# Patient Record
Sex: Female | Born: 1965
Health system: Southern US, Community
[De-identification: ages and names within clinical notes are randomized; demographics above are authoritative.]

## PROBLEM LIST (undated history)

## (undated) DIAGNOSIS — M179 Osteoarthritis of knee, unspecified: Secondary | ICD-10-CM

## (undated) DIAGNOSIS — D259 Leiomyoma of uterus, unspecified: Secondary | ICD-10-CM

## (undated) DIAGNOSIS — E538 Deficiency of other specified B group vitamins: Secondary | ICD-10-CM

## (undated) DIAGNOSIS — K429 Umbilical hernia without obstruction or gangrene: Secondary | ICD-10-CM

## (undated) DIAGNOSIS — R569 Unspecified convulsions: Secondary | ICD-10-CM

## (undated) DIAGNOSIS — E559 Vitamin D deficiency, unspecified: Secondary | ICD-10-CM

## (undated) DIAGNOSIS — I6529 Occlusion and stenosis of unspecified carotid artery: Secondary | ICD-10-CM

## (undated) DIAGNOSIS — I209 Angina pectoris, unspecified: Secondary | ICD-10-CM

## (undated) DIAGNOSIS — I739 Peripheral vascular disease, unspecified: Secondary | ICD-10-CM

## (undated) DIAGNOSIS — I10 Essential (primary) hypertension: Secondary | ICD-10-CM

## (undated) DIAGNOSIS — R7303 Prediabetes: Secondary | ICD-10-CM

## (undated) DIAGNOSIS — E7800 Pure hypercholesterolemia, unspecified: Secondary | ICD-10-CM

## (undated) DIAGNOSIS — I213 ST elevation (STEMI) myocardial infarction of unspecified site: Secondary | ICD-10-CM

## (undated) DIAGNOSIS — IMO0002 Reserved for concepts with insufficient information to code with codable children: Secondary | ICD-10-CM

## (undated) DIAGNOSIS — Z7902 Long term (current) use of antithrombotics/antiplatelets: Secondary | ICD-10-CM

## (undated) DIAGNOSIS — D649 Anemia, unspecified: Secondary | ICD-10-CM

## (undated) DIAGNOSIS — N390 Urinary tract infection, site not specified: Secondary | ICD-10-CM

## (undated) DIAGNOSIS — Z7982 Long term (current) use of aspirin: Secondary | ICD-10-CM

## (undated) DIAGNOSIS — I259 Chronic ischemic heart disease, unspecified: Secondary | ICD-10-CM

## (undated) DIAGNOSIS — M1711 Unilateral primary osteoarthritis, right knee: Secondary | ICD-10-CM

## (undated) DIAGNOSIS — M1990 Unspecified osteoarthritis, unspecified site: Secondary | ICD-10-CM

## (undated) DIAGNOSIS — R600 Localized edema: Secondary | ICD-10-CM

## (undated) DIAGNOSIS — R7989 Other specified abnormal findings of blood chemistry: Secondary | ICD-10-CM

## (undated) DIAGNOSIS — L03116 Cellulitis of left lower limb: Secondary | ICD-10-CM

## (undated) DIAGNOSIS — I2510 Atherosclerotic heart disease of native coronary artery without angina pectoris: Secondary | ICD-10-CM

## (undated) HISTORY — PX: OTHER SURGICAL HISTORY: SHX169

## (undated) HISTORY — DX: Other specified abnormal findings of blood chemistry: R79.89

## (undated) HISTORY — PX: KNEE SURGERY: SHX244

## (undated) HISTORY — DX: Cellulitis of left lower limb: L03.116

## (undated) HISTORY — PX: CORONARY ARTERY BYPASS GRAFT: SHX141

## (undated) HISTORY — PX: JOINT REPLACEMENT: SHX530

## (undated) HISTORY — DX: Urinary tract infection, site not specified: N39.0

---

## 2004-06-09 ENCOUNTER — Emergency Department: Payer: Self-pay | Admitting: Emergency Medicine

## 2005-03-14 ENCOUNTER — Emergency Department: Payer: Self-pay | Admitting: Internal Medicine

## 2005-08-05 ENCOUNTER — Ambulatory Visit: Payer: Self-pay | Admitting: Family Medicine

## 2008-06-03 ENCOUNTER — Ambulatory Visit: Payer: Self-pay | Admitting: Family Medicine

## 2009-05-21 ENCOUNTER — Ambulatory Visit: Payer: Self-pay | Admitting: Family Medicine

## 2009-06-16 ENCOUNTER — Ambulatory Visit: Payer: Self-pay | Admitting: Family Medicine

## 2009-07-22 ENCOUNTER — Emergency Department: Payer: Self-pay | Admitting: Emergency Medicine

## 2010-07-06 ENCOUNTER — Emergency Department: Payer: Self-pay | Admitting: Emergency Medicine

## 2010-07-12 ENCOUNTER — Ambulatory Visit: Payer: Self-pay | Admitting: Family Medicine

## 2010-07-29 ENCOUNTER — Ambulatory Visit: Payer: Self-pay | Admitting: Family Medicine

## 2011-03-17 ENCOUNTER — Emergency Department: Payer: Self-pay | Admitting: Internal Medicine

## 2011-06-05 ENCOUNTER — Emergency Department: Payer: Self-pay | Admitting: Emergency Medicine

## 2011-09-09 ENCOUNTER — Emergency Department: Payer: Self-pay | Admitting: Emergency Medicine

## 2011-09-09 LAB — URINALYSIS, COMPLETE
Bilirubin,UR: NEGATIVE
Ketone: NEGATIVE
Nitrite: NEGATIVE
Ph: 7 (ref 4.5–8.0)
RBC,UR: 16 /HPF (ref 0–5)
Specific Gravity: 1.005 (ref 1.003–1.030)
Squamous Epithelial: 7

## 2011-09-19 ENCOUNTER — Ambulatory Visit: Payer: Self-pay | Admitting: Family Medicine

## 2011-10-02 ENCOUNTER — Ambulatory Visit: Payer: Self-pay | Admitting: Family Medicine

## 2011-12-01 ENCOUNTER — Ambulatory Visit: Payer: Self-pay | Admitting: Family Medicine

## 2011-12-22 ENCOUNTER — Emergency Department: Payer: Self-pay | Admitting: Emergency Medicine

## 2011-12-22 LAB — URINALYSIS, COMPLETE
Bacteria: NONE SEEN
Bilirubin,UR: NEGATIVE
Glucose,UR: NEGATIVE mg/dL (ref 0–75)
Nitrite: NEGATIVE
Protein: 30
Squamous Epithelial: 14

## 2011-12-22 LAB — COMPREHENSIVE METABOLIC PANEL
Albumin: 2.9 g/dL — ABNORMAL LOW (ref 3.4–5.0)
Anion Gap: 7 (ref 7–16)
BUN: 7 mg/dL (ref 7–18)
Bilirubin,Total: 0.3 mg/dL (ref 0.2–1.0)
Calcium, Total: 8.4 mg/dL — ABNORMAL LOW (ref 8.5–10.1)
Chloride: 103 mmol/L (ref 98–107)
Co2: 27 mmol/L (ref 21–32)
Creatinine: 0.79 mg/dL (ref 0.60–1.30)
EGFR (African American): >60
EGFR (Non-African Amer.): >60
SGOT(AST): 20 U/L (ref 15–37)
SGPT (ALT): 14 U/L
Sodium: 137 mmol/L (ref 136–145)

## 2011-12-25 ENCOUNTER — Emergency Department: Payer: Self-pay | Admitting: Emergency Medicine

## 2011-12-25 LAB — COMPREHENSIVE METABOLIC PANEL
Alkaline Phosphatase: 83 U/L (ref 50–136)
Anion Gap: 7 (ref 7–16)
BUN: 8 mg/dL (ref 7–18)
Bilirubin,Total: 0.3 mg/dL (ref 0.2–1.0)
Co2: 28 mmol/L (ref 21–32)
EGFR (African American): >60
EGFR (Non-African Amer.): >60
Potassium: 3.4 mmol/L — ABNORMAL LOW (ref 3.5–5.1)
Sodium: 141 mmol/L (ref 136–145)
Total Protein: 8 g/dL (ref 6.4–8.2)

## 2011-12-25 LAB — CBC
MCH: 26 pg (ref 26.0–34.0)
Platelet: 374 10*3/uL (ref 150–440)

## 2011-12-25 LAB — LIPASE, BLOOD: Lipase: 144 U/L (ref 73–393)

## 2011-12-25 LAB — URINALYSIS, COMPLETE
Bilirubin,UR: NEGATIVE
Glucose,UR: NEGATIVE mg/dL (ref 0–75)
Nitrite: POSITIVE
Protein: 100
RBC,UR: 158 /HPF (ref 0–5)
Squamous Epithelial: 19
WBC UR: 56 /HPF (ref 0–5)

## 2011-12-27 LAB — URINE CULTURE

## 2012-04-04 ENCOUNTER — Ambulatory Visit: Payer: Self-pay | Admitting: Family Medicine

## 2012-11-15 ENCOUNTER — Emergency Department: Payer: Self-pay | Admitting: Emergency Medicine

## 2012-12-13 ENCOUNTER — Ambulatory Visit: Payer: Self-pay | Admitting: Family Medicine

## 2013-02-23 ENCOUNTER — Emergency Department: Payer: Self-pay | Admitting: Emergency Medicine

## 2013-10-22 ENCOUNTER — Ambulatory Visit: Payer: Self-pay | Admitting: Family Medicine

## 2014-03-30 ENCOUNTER — Ambulatory Visit: Payer: Self-pay | Admitting: Family Medicine

## 2014-09-21 ENCOUNTER — Emergency Department: Payer: Self-pay | Admitting: Emergency Medicine

## 2014-09-26 ENCOUNTER — Emergency Department: Payer: Self-pay | Admitting: Emergency Medicine

## 2014-10-20 ENCOUNTER — Emergency Department
Admit: 2014-10-20 | Disposition: A | Discharge status: Home / Self Care | Payer: Self-pay | Admitting: Emergency Medicine

## 2014-11-24 ENCOUNTER — Ambulatory Visit: Payer: Self-pay

## 2014-11-24 DIAGNOSIS — M1711 Unilateral primary osteoarthritis, right knee: Secondary | ICD-10-CM | POA: Insufficient documentation

## 2014-12-02 ENCOUNTER — Other Ambulatory Visit: Payer: Self-pay

## 2014-12-09 ENCOUNTER — Other Ambulatory Visit: Payer: Self-pay | Admitting: Urology

## 2014-12-09 DIAGNOSIS — R634 Abnormal weight loss: Secondary | ICD-10-CM

## 2014-12-09 DIAGNOSIS — R945 Abnormal results of liver function studies: Principal | ICD-10-CM

## 2014-12-09 DIAGNOSIS — R7989 Other specified abnormal findings of blood chemistry: Secondary | ICD-10-CM

## 2014-12-15 ENCOUNTER — Telehealth: Payer: Self-pay | Admitting: Urology

## 2014-12-15 NOTE — Telephone Encounter (Signed)
-----   Message from Sarah Medina sent at 12/15/2014 11:50 AM EDT ----- Regarding: clarification Please clarify order on weather you wanted a complete abdominal Ultrasound or a abdominal limited ultrasound. Patient is scheduled for first thing tomorrow morning.

## 2014-12-15 NOTE — Telephone Encounter (Signed)
Complete abdominal US.

## 2014-12-16 ENCOUNTER — Ambulatory Visit: Payer: Self-pay

## 2014-12-17 ENCOUNTER — Other Ambulatory Visit: Payer: Self-pay

## 2014-12-23 ENCOUNTER — Ambulatory Visit: Payer: Self-pay | Admitting: Internal Medicine

## 2014-12-23 DIAGNOSIS — R945 Abnormal results of liver function studies: Secondary | ICD-10-CM | POA: Insufficient documentation

## 2014-12-23 DIAGNOSIS — R7989 Other specified abnormal findings of blood chemistry: Secondary | ICD-10-CM | POA: Insufficient documentation

## 2014-12-28 ENCOUNTER — Ambulatory Visit
Admission: RE | Admit: 2014-12-28 | Discharge: 2014-12-28 | Disposition: A | Discharge status: Home / Self Care | Payer: Self-pay | Source: Ambulatory Visit | Attending: Urology | Admitting: Urology

## 2014-12-28 DIAGNOSIS — R7989 Other specified abnormal findings of blood chemistry: Secondary | ICD-10-CM | POA: Insufficient documentation

## 2014-12-28 DIAGNOSIS — R634 Abnormal weight loss: Secondary | ICD-10-CM | POA: Insufficient documentation

## 2014-12-28 DIAGNOSIS — R945 Abnormal results of liver function studies: Secondary | ICD-10-CM

## 2015-01-06 ENCOUNTER — Ambulatory Visit: Payer: Self-pay | Admitting: Ophthalmology

## 2015-01-13 ENCOUNTER — Ambulatory Visit: Payer: Self-pay

## 2015-01-20 ENCOUNTER — Ambulatory Visit: Payer: Self-pay | Admitting: Internal Medicine

## 2015-01-27 ENCOUNTER — Ambulatory Visit: Payer: Self-pay | Admitting: Internal Medicine

## 2015-02-03 ENCOUNTER — Ambulatory Visit: Payer: Self-pay | Admitting: Internal Medicine

## 2015-05-12 ENCOUNTER — Ambulatory Visit: Payer: Self-pay

## 2015-05-19 ENCOUNTER — Ambulatory Visit: Payer: Self-pay | Admitting: Internal Medicine

## 2015-05-24 ENCOUNTER — Other Ambulatory Visit: Payer: Self-pay | Admitting: Internal Medicine

## 2015-05-24 DIAGNOSIS — R945 Abnormal results of liver function studies: Secondary | ICD-10-CM

## 2015-05-31 ENCOUNTER — Ambulatory Visit: Payer: PRIVATE HEALTH INSURANCE

## 2015-06-01 ENCOUNTER — Other Ambulatory Visit: Payer: Self-pay

## 2015-06-24 ENCOUNTER — Emergency Department
Admission: EM | Admit: 2015-06-24 | Discharge: 2015-06-24 | Disposition: A | Discharge status: Home / Self Care | Payer: Self-pay | Attending: Emergency Medicine | Admitting: Emergency Medicine

## 2015-06-24 ENCOUNTER — Encounter: Payer: Self-pay | Admitting: Emergency Medicine

## 2015-06-24 DIAGNOSIS — L509 Urticaria, unspecified: Secondary | ICD-10-CM | POA: Insufficient documentation

## 2015-06-24 HISTORY — DX: Pure hypercholesterolemia, unspecified: E78.00

## 2015-06-24 MED ORDER — CYPROHEPTADINE HCL 4 MG PO TABS: 4.0000 mg | ORAL_TABLET | Freq: Three times a day (TID) | ORAL | Status: DC | PRN

## 2015-06-24 MED ORDER — PREDNISONE 10 MG PO TABS: 10.0000 mg | ORAL_TABLET | Freq: Two times a day (BID) | ORAL | Status: DC

## 2015-06-24 MED ORDER — CYPROHEPTADINE HCL 4 MG PO TABS
4.0000 mg | ORAL_TABLET | Freq: Once | ORAL | Status: AC
  Administered 2015-06-24: 4 mg via ORAL
  Filled 2015-06-24: qty 1

## 2015-06-24 MED ORDER — RANITIDINE HCL 150 MG PO TABS: 150.0000 mg | ORAL_TABLET | Freq: Two times a day (BID) | ORAL | Status: DC

## 2015-06-24 MED ORDER — FAMOTIDINE 20 MG PO TABS
40.0000 mg | ORAL_TABLET | Freq: Once | ORAL | Status: AC
  Administered 2015-06-24: 40 mg via ORAL
  Filled 2015-06-24: qty 2

## 2015-06-24 MED ORDER — DEXAMETHASONE SODIUM PHOSPHATE 10 MG/ML IJ SOLN
10.0000 mg | Freq: Once | INTRAMUSCULAR | Status: AC
  Administered 2015-06-24: 10 mg via INTRAMUSCULAR
  Filled 2015-06-24: qty 1

## 2015-06-24 NOTE — Discharge Instructions (Signed)
Hives Hives are itchy, red, swollen areas of the skin. They can vary in size and location on your body. Hives can come and go for hours or several days (acute hives) or for several weeks (chronic hives). Hives do not spread from person to person (noncontagious). They may get worse with scratching, exercise, and emotional stress. CAUSES   Allergic reaction to food, additives, or drugs.  Infections, including the common cold.  Illness, such as vasculitis, lupus, or thyroid disease.  Exposure to sunlight, heat, or cold.  Exercise.  Stress.  Contact with chemicals. SYMPTOMS   Red or white swollen patches on the skin. The patches may change size, shape, and location quickly and repeatedly.  Itching.  Swelling of the hands, feet, and face. This may occur if hives develop deeper in the skin. DIAGNOSIS  Your caregiver can usually tell what is wrong by performing a physical exam. Skin or blood tests may also be done to determine the cause of your hives. In some cases, the cause cannot be determined. TREATMENT  Mild cases usually get better with medicines such as antihistamines. Severe cases may require an emergency epinephrine injection. If the cause of your hives is known, treatment includes avoiding that trigger.  HOME CARE INSTRUCTIONS   Avoid causes that trigger your hives.  Take antihistamines as directed by your caregiver to reduce the severity of your hives. Non-sedating or low-sedating antihistamines are usually recommended. Do not drive while taking an antihistamine.  Take any other medicines prescribed for itching as directed by your caregiver.  Wear loose-fitting clothing.  Keep all follow-up appointments as directed by your caregiver. SEEK MEDICAL CARE IF:   You have persistent or severe itching that is not relieved with medicine.  You have painful or swollen joints. SEEK IMMEDIATE MEDICAL CARE IF:   You have a fever.  Your tongue or lips are swollen.  You have  trouble breathing or swallowing.  You feel tightness in the throat or chest.  You have abdominal pain. These problems may be the first sign of a life-threatening allergic reaction. Call your local emergency services (911 in [place] MAKE SURE YOU:   Understand these instructions.  Will watch your condition.  Will get help right away if you are not doing well or get worse.   This information is not intended to replace advice given to you by your health care provider. Make sure you discuss any questions you have with your health care provider.   Document Released: 06/19/2005 Document Revised: 06/24/2013 Document Reviewed: 09/12/2011 Elsevier Interactive Patient Education 2016 [place] the prescription meds as directed. Follow-up with your provider as needed. Return to the ED for worsening symptoms like swelling of the mouth, lips, or tongue.

## 2015-06-24 NOTE — ED Notes (Signed)
Reports hives and itching for several days.  No resp distress

## 2015-06-24 NOTE — ED Provider Notes (Signed)
[facility] Emergency Department Provider Note ____________________________________________  Time seen: 1035  I have reviewed the triage vital signs and the nursing notes.  HISTORY  Chief Complaint  Urticaria  HPI Shenelle Hager is a 49 y.o. female reports to the ED for evaluation of hives of an unknown source. She describes itching seemed to worsen over the last few days with onset Sunday morning. She does not have a history of any known allergies or sensitivities. She does report similar episode several months earlier, without a known cause. She denies any difficulty breathing, swallowing, or swelling of her mucous membranes. She is not taking any medication at home for symptom relief.  Past Medical History  Diagnosis Date  . Hypercholesteremia    There are no active problems to display for this patient.  History reviewed. No pertinent past surgical history.  Current Outpatient Rx  Name  Route  Sig  Dispense  Refill  . cyproheptadine (PERIACTIN) 4 MG tablet   Oral   Take 1 tablet (4 mg total) by mouth 3 (three) times daily as needed for allergies.   30 tablet   0   . predniSONE (DELTASONE) 10 MG tablet   Oral   Take 1 tablet (10 mg total) by mouth 2 (two) times daily with a meal.   10 tablet   0   . ranitidine (ZANTAC) 150 MG tablet   Oral   Take 1 tablet (150 mg total) by mouth 2 (two) times daily.   20 tablet   0     Allergies Review of patient's allergies indicates no known allergies.  History reviewed. No pertinent family history.  Social History Social History  Substance Use Topics  . Smoking status: Never Smoker   . Smokeless tobacco: None  . Alcohol Use: None   Review of Systems  Constitutional: Negative for fever. Eyes: Negative for visual changes. ENT: Negative for sore throat. Cardiovascular: Negative for chest pain. Respiratory: Negative for shortness of breath. Gastrointestinal: Negative for abdominal pain,  vomiting and diarrhea. Genitourinary: Negative for dysuria. Musculoskeletal: Negative for back pain. Skin: Positive for rash. Neurological: Negative for headaches, focal weakness or numbness. ____________________________________________  PHYSICAL EXAM:  VITAL SIGNS: ED Triage Vitals  Enc Vitals Group     BP 06/24/15 1018 157/91 mmHg     Pulse Rate 06/24/15 1018 70     Resp 06/24/15 1018 18     Temp 06/24/15 1018 98.2 F (36.8 C)     Temp Source 06/24/15 1018 Oral     SpO2 06/24/15 1018 97 %     Weight 06/24/15 1018 280 lb (127.007 kg)     Height 06/24/15 1018 5\' 6"  (1.676 m)     Head Cir --      Peak Flow --      Pain Score --      Pain Loc --      Pain Edu? --      Excl. in [place]? --    Constitutional: Alert and oriented. Well appearing and in no distress. Head: Normocephalic and atraumatic.      Eyes: Conjunctivae are normal. PERRL. Normal extraocular movements      Ears: Canals clear. TMs intact bilaterally.   Nose: No congestion/rhinorrhea.   Mouth/Throat: Mucous membranes are moist. Uvula is midline and tonsils are flat.   Neck: Supple. No thyromegaly. Hematological/Lymphatic/Immunological: No cervical lymphadenopathy. Cardiovascular: Normal rate, regular rhythm.  Respiratory: Normal respiratory effort. No wheezes/rales/rhonchi. Gastrointestinal: Soft and nontender. No distention. Musculoskeletal: Nontender with  normal range of motion in all extremities.  Neurologic:  Normal gait without ataxia. Normal speech and language. No gross focal neurologic deficits are appreciated. Skin:  Skin is warm, dry and intact. Large erythematous welps noted across the face torso and extremities consistent with urticaria. Psychiatric: Mood and affect are normal. Patient exhibits appropriate insight and judgment. ____________________________________________  PROCEDURES  Decadron 10 mg IM Cyproheptadine 4 mg PO Famotidine 40 mh  PO ____________________________________________  INITIAL IMPRESSION / ASSESSMENT AND PLAN / ED COURSE  Patient with a presentation consistent with idiopathic hives of unknown etiology. Patient is discharged with prescriptions for Periactin and ranitidine to dose in addition to prednisone. She will follow with her primary care provider for ongoing symptoms. She is encouraged to return to the ED for acutely worsening symptoms. ____________________________________________  FINAL CLINICAL IMPRESSION(S) / ED DIAGNOSES  Final diagnoses:  Hives      Melvenia Needles, PA-C 06/24/15 1130  Ahmed Prima, MD 06/24/15 [phone]

## 2015-06-24 NOTE — ED Notes (Signed)
assessement per PA

## 2015-06-30 ENCOUNTER — Emergency Department: Payer: Self-pay

## 2015-06-30 ENCOUNTER — Encounter: Payer: Self-pay | Admitting: *Deleted

## 2015-06-30 ENCOUNTER — Emergency Department
Admission: EM | Admit: 2015-06-30 | Discharge: 2015-06-30 | Disposition: A | Discharge status: Home / Self Care | Payer: Self-pay | Attending: Emergency Medicine | Admitting: Emergency Medicine

## 2015-06-30 DIAGNOSIS — L509 Urticaria, unspecified: Secondary | ICD-10-CM | POA: Insufficient documentation

## 2015-06-30 DIAGNOSIS — Z7952 Long term (current) use of systemic steroids: Secondary | ICD-10-CM | POA: Insufficient documentation

## 2015-06-30 DIAGNOSIS — Z79899 Other long term (current) drug therapy: Secondary | ICD-10-CM | POA: Insufficient documentation

## 2015-06-30 DIAGNOSIS — R2231 Localized swelling, mass and lump, right upper limb: Secondary | ICD-10-CM | POA: Insufficient documentation

## 2015-06-30 MED ORDER — DEXAMETHASONE SODIUM PHOSPHATE 10 MG/ML IJ SOLN
10.0000 mg | Freq: Once | INTRAMUSCULAR | Status: AC
  Administered 2015-06-30: 10 mg via INTRAMUSCULAR
  Filled 2015-06-30: qty 1

## 2015-06-30 MED ORDER — HYDROXYZINE HCL 50 MG PO TABS
50.0000 mg | ORAL_TABLET | Freq: Once | ORAL | Status: AC
Start: 2015-06-30 — End: 2015-06-30
  Administered 2015-06-30: 50 mg via ORAL
  Filled 2015-06-30: qty 1

## 2015-06-30 MED ORDER — HYDROXYZINE PAMOATE 25 MG PO CAPS: 25.0000 mg | ORAL_CAPSULE | Freq: Three times a day (TID) | ORAL | Status: DC | PRN

## 2015-06-30 NOTE — Discharge Instructions (Signed)
Hives Hives are itchy, red, swollen areas of the skin. They can vary in size and location on your body. Hives can come and go for hours or several days (acute hives) or for several weeks (chronic hives). Hives do not spread from person to person (noncontagious). They may get worse with scratching, exercise, and emotional stress. CAUSES   Allergic reaction to food, additives, or drugs.  Infections, including the common cold.  Illness, such as vasculitis, lupus, or thyroid disease.  Exposure to sunlight, heat, or cold.  Exercise.  Stress.  Contact with chemicals. SYMPTOMS   Red or white swollen patches on the skin. The patches may change size, shape, and location quickly and repeatedly.  Itching.  Swelling of the hands, feet, and face. This may occur if hives develop deeper in the skin. DIAGNOSIS  Your caregiver can usually tell what is wrong by performing a physical exam. Skin or blood tests may also be done to determine the cause of your hives. In some cases, the cause cannot be determined. TREATMENT  Mild cases usually get better with medicines such as antihistamines. Severe cases may require an emergency epinephrine injection. If the cause of your hives is known, treatment includes avoiding that trigger.  HOME CARE INSTRUCTIONS   Avoid causes that trigger your hives.  Take antihistamines as directed by your caregiver to reduce the severity of your hives. Non-sedating or low-sedating antihistamines are usually recommended. Do not drive while taking an antihistamine.  Take any other medicines prescribed for itching as directed by your caregiver.  Wear loose-fitting clothing.  Keep all follow-up appointments as directed by your caregiver. SEEK MEDICAL CARE IF:   You have persistent or severe itching that is not relieved with medicine.  You have painful or swollen joints. SEEK IMMEDIATE MEDICAL CARE IF:   You have a fever.  Your tongue or lips are swollen.  You have  trouble breathing or swallowing.  You feel tightness in the throat or chest.  You have abdominal pain. These problems may be the first sign of a life-threatening allergic reaction. Call your local emergency services (911 in [place] MAKE SURE YOU:   Understand these instructions.  Will watch your condition.  Will get help right away if you are not doing well or get worse.   This information is not intended to replace advice given to you by your health care provider. Make sure you discuss any questions you have with your health care provider.   Document Released: 06/19/2005 Document Revised: 06/24/2013 Document Reviewed: 09/12/2011 Elsevier Interactive Patient Education 2016 [employer].   Continue to monitor symptoms. Avoid hot showers. Follow-up with [facility] as needed for ongoing symptoms.

## 2015-06-30 NOTE — ED Provider Notes (Signed)
[facility] Emergency Department Provider Note ____________________________________________  Time seen: 2125  I have reviewed the triage vital signs and the nursing notes.  HISTORY  Chief Complaint  Hand Problem  HPI Sarah Medina is a 49 y.o. female returns to the ED for evaluation of ongoing hives to her trunk and extremities of an unknown cause.Additionally she has some swelling to the right hand without known injury or trauma. She admits that she has been taking the prednisone and the ranitidine as previous prescribed. She has also notes that she had resolution of her symptoms within 24 hours of receiving a steroid shot here in the ED last week. She is unable to pick up the Periactin due to cost.  Past Medical History  Diagnosis Date  . Hypercholesteremia     There are no active problems to display for this patient.   No past surgical history on file.  Current Outpatient Rx  Name  Route  Sig  Dispense  Refill  . hydrOXYzine (VISTARIL) 25 MG capsule   Oral   Take 1 capsule (25 mg total) by mouth 3 (three) times daily as needed for itching. Take 1-2 tabs by mouth 3 (three) times a day as needed for itching   30 capsule   0   . predniSONE (DELTASONE) 10 MG tablet   Oral   Take 1 tablet (10 mg total) by mouth 2 (two) times daily with a meal.   10 tablet   0   . ranitidine (ZANTAC) 150 MG tablet   Oral   Take 1 tablet (150 mg total) by mouth 2 (two) times daily.   20 tablet   0     Allergies Review of patient's allergies indicates no known allergies.  No family history on file.  Social History Social History  Substance Use Topics  . Smoking status: Never Smoker   . Smokeless tobacco: None  . Alcohol Use: No   Review of Systems  Constitutional: Negative for fever. Eyes: Negative for visual changes. ENT: Negative for sore throat. Cardiovascular: Negative for chest pain. Respiratory: Negative for shortness of  breath. Gastrointestinal: Negative for abdominal pain, vomiting and diarrhea. Genitourinary: Negative for dysuria. Musculoskeletal: Negative for back pain. Skin: Positive for rash. Neurological: Negative for headaches, focal weakness or numbness. ____________________________________________  PHYSICAL EXAM:  VITAL SIGNS: ED Triage Vitals  Enc Vitals Group     BP 06/30/15 2029 140/78 mmHg     Pulse Rate 06/30/15 2029 93     Resp --      Temp 06/30/15 2029 97.7 F (36.5 C)     Temp Source 06/30/15 2029 Oral     SpO2 06/30/15 2029 100 %     Weight 06/30/15 2029 291 lb 11.2 oz (132.314 kg)     Height 06/30/15 2029 5\' 6"  (1.676 m)     Head Cir --      Peak Flow --      Pain Score 06/30/15 2038 8     Pain Loc --      Pain Edu? --      Excl. in [place]? --    Constitutional: Alert and oriented. Well appearing and in no distress. Head: Normocephalic and atraumatic.      Eyes: Conjunctivae are normal. PERRL. Normal extraocular movements      Ears: Canals clear. TMs intact bilaterally.   Nose: No congestion/rhinorrhea.   Mouth/Throat: Mucous membranes are moist.   Neck: Supple. No thyromegaly. Hematological/Lymphatic/Immunological: No cervical lymphadenopathy. Cardiovascular: Normal rate,  regular rhythm.  Respiratory: Normal respiratory effort. No wheezes/rales/rhonchi. Gastrointestinal: Soft and nontender. No distention. Musculoskeletal: The right hand shows erythema and swelling over the 3rd-5th MCPs dorsally with edema to the PIPs as well.   Nontender with normal range of motion in all extremities.  Neurologic:  Normal gait without ataxia. Normal speech and language. No gross focal neurologic deficits are appreciated. Skin:  Skin is warm, dry and intact. Patient presents with large hives to the trunk and extremities. Psychiatric: Mood and affect are normal. Patient exhibits appropriate insight and judgment. 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  ____________________________________________  PROCEDURES  Decadron 10 mg IM Vistaril 50 mg PO ____________________________________________  INITIAL IMPRESSION / ASSESSMENT AND PLAN / ED COURSE  Patient with recurrence of hives of unknown source. She also has some mild osteophytes in the right hand is noted on x-ray. She will be discharged with a prescription for Vistaril to dose in addition to the remaining prednisone and ranitidine she received last week. She is encouraged follow with the primary provider for ongoing symptoms. ____________________________________________  FINAL CLINICAL IMPRESSION(S) / ED DIAGNOSES  Final diagnoses:  Hives of unknown origin      Melvenia Needles, PA-C 06/30/15 [place], MD 06/30/15 2308

## 2015-06-30 NOTE — ED Notes (Signed)
Pt has swelling and burning to right hand.  No known injury t o hand.  Pt was seen in er 06/24/15 for hives and continues to have rash on arms and abdomen.  Pt is taking prednisone and zantac.

## 2015-07-20 ENCOUNTER — Encounter
Admission: EM | Disposition: A | Discharge status: Home / Self Care | Payer: Self-pay | Source: Home / Self Care | Attending: Cardiology

## 2015-07-20 ENCOUNTER — Encounter: Payer: Self-pay | Admitting: Certified Registered Nurse Anesthetist

## 2015-07-20 ENCOUNTER — Encounter: Payer: Self-pay | Admitting: Cardiology

## 2015-07-20 ENCOUNTER — Inpatient Hospital Stay
Admission: EM | Admit: 2015-07-20 | Discharge: 2015-07-22 | DRG: 247 | Disposition: A | Discharge status: Home / Self Care | Payer: Medicaid Other | Attending: Cardiology | Admitting: Cardiology

## 2015-07-20 DIAGNOSIS — E785 Hyperlipidemia, unspecified: Secondary | ICD-10-CM | POA: Diagnosis present

## 2015-07-20 DIAGNOSIS — I213 ST elevation (STEMI) myocardial infarction of unspecified site: Secondary | ICD-10-CM | POA: Diagnosis present

## 2015-07-20 DIAGNOSIS — I2119 ST elevation (STEMI) myocardial infarction involving other coronary artery of inferior wall: Secondary | ICD-10-CM

## 2015-07-20 DIAGNOSIS — Z8249 Family history of ischemic heart disease and other diseases of the circulatory system: Secondary | ICD-10-CM

## 2015-07-20 DIAGNOSIS — I10 Essential (primary) hypertension: Secondary | ICD-10-CM | POA: Diagnosis present

## 2015-07-20 DIAGNOSIS — IMO0002 Reserved for concepts with insufficient information to code with codable children: Secondary | ICD-10-CM

## 2015-07-20 DIAGNOSIS — I2510 Atherosclerotic heart disease of native coronary artery without angina pectoris: Secondary | ICD-10-CM

## 2015-07-20 HISTORY — DX: ST elevation (STEMI) myocardial infarction involving other coronary artery of inferior wall: I21.19

## 2015-07-20 HISTORY — DX: Atherosclerotic heart disease of native coronary artery without angina pectoris: I25.10

## 2015-07-20 HISTORY — DX: Reserved for concepts with insufficient information to code with codable children: IMO0002

## 2015-07-20 HISTORY — PX: CARDIAC CATHETERIZATION: SHX172

## 2015-07-20 LAB — CBC
HCT: 41.8 % (ref 35.0–47.0)
Hemoglobin: 13.8 g/dL (ref 12.0–16.0)
MCH: 26.8 pg (ref 26.0–34.0)
MCHC: 32.9 g/dL (ref 32.0–36.0)
MCV: 81.4 fL (ref 80.0–100.0)
PLATELETS: 348 10*3/uL (ref 150–440)
RBC: 5.14 MIL/uL (ref 3.80–5.20)
RDW: 15.1 % — ABNORMAL HIGH (ref 11.5–14.5)
WBC: 10.4 10*3/uL (ref 3.6–11.0)

## 2015-07-20 LAB — PROTIME-INR
INR: 1.01
Prothrombin Time: 13.5 seconds (ref 11.4–15.0)

## 2015-07-20 LAB — CREATININE, SERUM
Creatinine, Ser: 0.83 mg/dL (ref 0.44–1.00)
GFR calc Af Amer: >60 mL/min (ref >60)
GFR calc non Af Amer: >60 mL/min (ref >60)

## 2015-07-20 LAB — APTT: aPTT: 31 seconds (ref 24–36)

## 2015-07-20 LAB — TROPONIN I
TROPONIN I: 12.98 ng/mL — AB (ref <0.031)
Troponin I: 0.94 ng/mL — ABNORMAL HIGH (ref <0.031)

## 2015-07-20 LAB — MRSA PCR SCREENING: MRSA BY PCR: NEGATIVE

## 2015-07-20 SURGERY — LEFT HEART CATH AND CORONARY ANGIOGRAPHY
Anesthesia: Moderate Sedation

## 2015-07-20 MED ORDER — ONDANSETRON HCL 4 MG/2ML IJ SOLN: 4.0000 mg | Freq: Four times a day (QID) | INTRAMUSCULAR | Status: DC | PRN

## 2015-07-20 MED ORDER — METOPROLOL TARTRATE 25 MG PO TABS
25.0000 mg | ORAL_TABLET | Freq: Two times a day (BID) | ORAL | Status: DC
  Administered 2015-07-20 – 2015-07-22 (×5): 25 mg via ORAL
  Filled 2015-07-20 (×5): qty 1

## 2015-07-20 MED ORDER — MIDAZOLAM HCL 2 MG/2ML IJ SOLN
INTRAMUSCULAR | Status: AC
  Filled 2015-07-20: qty 2

## 2015-07-20 MED ORDER — BIVALIRUDIN 250 MG IV SOLR
INTRAVENOUS | Status: AC
  Filled 2015-07-20: qty 500

## 2015-07-20 MED ORDER — NITROGLYCERIN 1 MG/10 ML FOR IR/CATH LAB
INTRA_ARTERIAL | Status: DC | PRN
  Administered 2015-07-20 (×3): 200 ug

## 2015-07-20 MED ORDER — CLOPIDOGREL BISULFATE 75 MG PO TABS
75.0000 mg | ORAL_TABLET | Freq: Every day | ORAL | Status: DC
  Administered 2015-07-21 – 2015-07-22 (×2): 75 mg via ORAL
  Filled 2015-07-20 (×2): qty 1

## 2015-07-20 MED ORDER — ENALAPRIL MALEATE 5 MG PO TABS
5.0000 mg | ORAL_TABLET | Freq: Two times a day (BID) | ORAL | Status: DC
  Administered 2015-07-20 – 2015-07-22 (×5): 5 mg via ORAL
  Filled 2015-07-20 (×8): qty 1

## 2015-07-20 MED ORDER — ASPIRIN EC 325 MG PO TBEC
325.0000 mg | DELAYED_RELEASE_TABLET | Freq: Every day | ORAL | Status: DC
  Administered 2015-07-20 – 2015-07-22 (×3): 325 mg via ORAL
  Filled 2015-07-20 (×3): qty 1

## 2015-07-20 MED ORDER — HEPARIN (PORCINE) IN NACL 100-0.45 UNIT/ML-% IJ SOLN
1150.0000 [IU]/h | INTRAMUSCULAR | Status: DC
  Filled 2015-07-20: qty 250

## 2015-07-20 MED ORDER — CLOPIDOGREL BISULFATE 75 MG PO TABS
ORAL_TABLET | ORAL | Status: DC | PRN
  Administered 2015-07-20: 600 mg via ORAL

## 2015-07-20 MED ORDER — HEPARIN (PORCINE) IN NACL 2-0.9 UNIT/ML-% IJ SOLN
INTRAMUSCULAR | Status: AC
  Filled 2015-07-20: qty 1000

## 2015-07-20 MED ORDER — CLOPIDOGREL BISULFATE 75 MG PO TABS
ORAL_TABLET | ORAL | Status: AC
  Filled 2015-07-20: qty 8

## 2015-07-20 MED ORDER — METOPROLOL TARTRATE 1 MG/ML IV SOLN
INTRAVENOUS | Status: AC
  Filled 2015-07-20: qty 5

## 2015-07-20 MED ORDER — NITROGLYCERIN 5 MG/ML IV SOLN
INTRAVENOUS | Status: AC
  Filled 2015-07-20: qty 10

## 2015-07-20 MED ORDER — MIDAZOLAM HCL 2 MG/2ML IJ SOLN
INTRAMUSCULAR | Status: DC | PRN
  Administered 2015-07-20: 1 mg via INTRAVENOUS

## 2015-07-20 MED ORDER — SODIUM CHLORIDE 0.9 % WEIGHT BASED INFUSION: 3.0000 mL/kg/h | INTRAVENOUS | Status: DC

## 2015-07-20 MED ORDER — SODIUM CHLORIDE 0.9 % IV SOLN
250.0000 mg | INTRAVENOUS | Status: DC | PRN
  Administered 2015-07-20: 1.75 mg/kg/h via INTRAVENOUS

## 2015-07-20 MED ORDER — ACETAMINOPHEN 325 MG PO TABS
650.0000 mg | ORAL_TABLET | ORAL | Status: DC | PRN
  Administered 2015-07-21: 650 mg via ORAL
  Filled 2015-07-20: qty 2

## 2015-07-20 MED ORDER — FENTANYL CITRATE (PF) 100 MCG/2ML IJ SOLN
INTRAMUSCULAR | Status: DC | PRN
  Administered 2015-07-20: 50 ug via INTRAVENOUS

## 2015-07-20 MED ORDER — MORPHINE SULFATE (PF) 4 MG/ML IV SOLN
4.0000 mg | Freq: Once | INTRAVENOUS | Status: AC
  Administered 2015-07-20: 4 mg via INTRAVENOUS
  Filled 2015-07-20: qty 1

## 2015-07-20 MED ORDER — SODIUM CHLORIDE 0.9 % IV SOLN: 250.0000 mL | INTRAVENOUS | Status: DC | PRN

## 2015-07-20 MED ORDER — ASPIRIN 81 MG PO CHEW
324.0000 mg | CHEWABLE_TABLET | Freq: Once | ORAL | Status: AC
  Administered 2015-07-20: 324 mg via ORAL

## 2015-07-20 MED ORDER — METOPROLOL TARTRATE 1 MG/ML IV SOLN
INTRAVENOUS | Status: DC | PRN
  Administered 2015-07-20: 2.5 mg via INTRAVENOUS

## 2015-07-20 MED ORDER — IOHEXOL 300 MG/ML  SOLN
INTRAMUSCULAR | Status: DC | PRN
  Administered 2015-07-20: 250 mL via INTRA_ARTERIAL

## 2015-07-20 MED ORDER — HEPARIN BOLUS VIA INFUSION
4000.0000 [IU] | Freq: Once | INTRAVENOUS | Status: DC
  Filled 2015-07-20: qty 4000

## 2015-07-20 MED ORDER — SODIUM CHLORIDE 0.9 % IJ SOLN
3.0000 mL | Freq: Two times a day (BID) | INTRAMUSCULAR | Status: DC
  Administered 2015-07-20 – 2015-07-21 (×4): 3 mL via INTRAVENOUS

## 2015-07-20 MED ORDER — BIVALIRUDIN BOLUS VIA INFUSION - CUPID
INTRAVENOUS | Status: DC | PRN
  Administered 2015-07-20: 95.25 mg via INTRAVENOUS

## 2015-07-20 MED ORDER — SODIUM CHLORIDE 0.9 % IJ SOLN: 3.0000 mL | INTRAMUSCULAR | Status: DC | PRN

## 2015-07-20 MED ORDER — FENTANYL CITRATE (PF) 100 MCG/2ML IJ SOLN
INTRAMUSCULAR | Status: AC
  Filled 2015-07-20: qty 2

## 2015-07-20 SURGICAL SUPPLY — 14 items
BALLN MINITREK RX 2.0X20 (BALLOONS) ×3
BALLOON MINITREK RX 2.0X20 (BALLOONS) ×1 IMPLANT
CATH INFINITI 5FR ANG PIGTAIL (CATHETERS) ×3 IMPLANT
CATH INFINITI 5FR JL4 (CATHETERS) ×3 IMPLANT
CATH VISTA GUIDE 6FR JR4 SH (CATHETERS) ×3 IMPLANT
DEVICE CLOSURE MYNXGRIP 6/7F (Vascular Products) ×3 IMPLANT
DEVICE INFLAT 30 PLUS (MISCELLANEOUS) ×3 IMPLANT
KIT MANI 3VAL PERCEP (MISCELLANEOUS) ×3 IMPLANT
NEEDLE PERC 18GX7CM (NEEDLE) ×3 IMPLANT
PACK CARDIAC CATH (CUSTOM PROCEDURE TRAY) ×3 IMPLANT
SHEATH PINNACLE 6F 10CM (SHEATH) ×3 IMPLANT
STENT XIENCE ALPINE RX 2.25X23 (Permanent Stent) ×3 IMPLANT
WIRE ASAHI PROWATER 180CM (WIRE) ×6 IMPLANT
WIRE EMERALD 3MM-J .035X150CM (WIRE) ×6 IMPLANT

## 2015-07-20 NOTE — ED Provider Notes (Signed)
[facility] Emergency Department Provider Note  ____________________________________________  Time seen: Seen upon arrival to the treatment area  I have reviewed the triage vital signs and the nursing notes.   HISTORY  Chief Complaint Chest Pain and Weakness    HPI Sarah Medina is a 50 y.o. female with a history of high cholesterol who is presenting today with midsternal pressure-like chest pain which is a 5 out of 10. She says it started yesterday and has been waxing and waning without any radiation. She has some mild associated shortness of breath. No nausea or vomiting. Says that she does not smoke. Has not had any aspirin earlier in the day. Says she is a family history of heart attack.   Past Medical History  Diagnosis Date  . Hypercholesteremia     There are no active problems to display for this patient.   No past surgical history on file.  Current Outpatient Rx  Name  Route  Sig  Dispense  Refill  . hydrOXYzine (VISTARIL) 25 MG capsule   Oral   Take 1 capsule (25 mg total) by mouth 3 (three) times daily as needed for itching. Take 1-2 tabs by mouth 3 (three) times a day as needed for itching   30 capsule   0   . predniSONE (DELTASONE) 10 MG tablet   Oral   Take 1 tablet (10 mg total) by mouth 2 (two) times daily with a meal.   10 tablet   0   . ranitidine (ZANTAC) 150 MG tablet   Oral   Take 1 tablet (150 mg total) by mouth 2 (two) times daily.   20 tablet   0     Allergies Review of patient's allergies indicates no known allergies.  No family history on file.  Social History Social History  Substance Use Topics  . Smoking status: Never Smoker   . Smokeless tobacco: Not on file  . Alcohol Use: No    Review of Systems Constitutional: No fever/chills Eyes: No visual changes. ENT: No sore throat. Cardiovascular: As above Respiratory: Denies shortness of breath. Gastrointestinal: No abdominal pain.  No nausea,  no vomiting.  No diarrhea.  No constipation. Genitourinary: Negative for dysuria. Musculoskeletal: Negative for back pain. Skin: Negative for rash. Neurological: Negative for headaches, focal weakness or numbness.  10-point ROS otherwise negative.  ____________________________________________   PHYSICAL EXAM:  VITAL SIGNS: ED Triage Vitals  Enc Vitals Group     BP --      Pulse --      Resp --      Temp --      Temp src --      SpO2 --      Weight 07/20/15 0747 280 lb (127.007 kg)     Height 07/20/15 0747 5\' 6"  (1.676 m)     Head Cir --      Peak Flow --      Pain Score 07/20/15 0748 10     Pain Loc --      Pain Edu? --      Excl. in [place]? --     Constitutional: Alert and oriented. Well appearing and in no acute distress. Eyes: Conjunctivae are normal. PERRL. EOMI. Head: Atraumatic. Nose: No congestion/rhinnorhea. Mouth/Throat: Mucous membranes are moist.  Oropharynx non-erythematous. Neck: No stridor.   Cardiovascular: Normal rate, regular rhythm. Grossly normal heart sounds.  Good peripheral circulation. Respiratory: Normal respiratory effort.  No retractions. Lungs CTAB. Gastrointestinal: Soft and nontender. No distention.  No abdominal bruits. No CVA tenderness. Musculoskeletal: No lower extremity tenderness nor edema.  No joint effusions. Neurologic:  Normal speech and language. No gross focal neurologic deficits are appreciated. No gait instability. Skin:  Skin is warm, dry and intact. No rash noted. Psychiatric: Mood and affect are normal. Speech and behavior are normal.  ____________________________________________   LABS (all labs ordered are listed, but only abnormal results are displayed)  Labs Reviewed  CBC  APTT  PROTIME-INR  CREATININE, SERUM   ____________________________________________  EKG  ED ECG REPORT I, Augusten Lipkin,  Youlanda Roys, the attending physician, personally viewed and interpreted this ECG.   Date: 07/20/2015  EKG Time: 745  Rate:  84  Rhythm: normal sinus rhythm  Axis: Right axis deviation  Intervals:none  ST&T Change: ST elevations in 1, 2, 3, aVF with cervical depression in aVR and T-wave inversion in aVL. Also depression in V2.  ED ECG REPORT I, Shahzain Kiester,  Youlanda Roys, the attending physician, personally viewed and interpreted this ECG.   Date: 07/20/2015  EKG Time: 755  Rate: 85  Rhythm: normal sinus rhythm  Axis: Normal axis  Intervals:none  ST&T Change: ST elevations are redemonstrated and 2 as well as 3 of 1 mm each also about 0.5 mm ST elevation in aVF.  ____________________________________________  RADIOLOGY   ____________________________________________   PROCEDURES  CRITICAL CARE Performed by: Doran Stabler   Total critical care time: 35 minutes  Critical care time was exclusive of separately billable procedures and treating other patients.  Critical care was necessary to treat or prevent imminent or life-threatening deterioration.  Critical care was time spent personally by me on the following activities: development of treatment plan with patient and/or surrogate as well as nursing, discussions with consultants, evaluation of patient's response to treatment, examination of patient, obtaining history from patient or surrogate, ordering and performing treatments and interventions, ordering and review of laboratory studies, ordering and review of radiographic studies, pulse oximetry and re-evaluation of patient's condition.   ____________________________________________   INITIAL IMPRESSION / ASSESSMENT AND PLAN / ED COURSE  Pertinent labs & imaging results that were available during my care of the patient were reviewed by me and considered in my medical decision making (see chart for details).  ----------------------------------------- 746 AM on 07/20/2015 ----------------------------------------- STEMI alert called and Dr. Saralyn Pilar on his way to the emergency  department.  ----------------------------------------- 8:02 AM on 07/20/2015 -----------------------------------------  Patient still without any acute distress. Given aspirin and nitroglycerin. Heparin is ordered. Patient aware of diagnosis and need for catheterization. Understands plan and is one to comply.  ____________________________________________   FINAL CLINICAL IMPRESSION(S) / ED DIAGNOSES  ST elevation MI.    Orbie Pyo, MD 07/20/15 [phone]

## 2015-07-20 NOTE — Progress Notes (Signed)
Dr. Saralyn Pilar notified of blood pressure 170/114.

## 2015-07-20 NOTE — H&P (Signed)
[facility] Cardiology History and Physical  Patient ID: Sarah Medina MRN: [id number] DOB/AGE: [birth year] 51 y.o. Admit date: 07/20/2015  Primary Care Physician: PROVIDER NOT [place] [facility]ologist Saurav Crumble  HPI: 50 year old female admitted with new onset chest pain. She was in her usual state of health until yesterday when she started to experience sternal chest pain, which waxed and waned throughout the day and night, with radiation to her left arm. She experienced recurrent chest pain this morning, presented to [facility] emergency room where ECG revealed diagnostic ST elevations in the inferior leads consistent with acute inferior ST elevation myocardial infarction.    Past Medical History  Diagnosis Date  . Hypercholesteremia     No past surgical history on file.  Prescriptions prior to admission  Medication Sig Dispense Refill Last Dose  . hydrOXYzine (VISTARIL) 25 MG capsule Take 1 capsule (25 mg total) by mouth 3 (three) times daily as needed for itching. Take 1-2 tabs by mouth 3 (three) times a day as needed for itching 30 capsule 0   . predniSONE (DELTASONE) 10 MG tablet Take 1 tablet (10 mg total) by mouth 2 (two) times daily with a meal. 10 tablet 0   . ranitidine (ZANTAC) 150 MG tablet Take 1 tablet (150 mg total) by mouth 2 (two) times daily. 20 tablet 0    Social History   Social History  . Marital Status: Legally Separated    Spouse Name: N/A  . Number of Children: N/A  . Years of Education: N/A   Occupational History  . Not on file.   Social History Main Topics  . Smoking status: Never Smoker   . Smokeless tobacco: Not on file  . Alcohol Use: No  . Drug Use: Not on file  . Sexual Activity: Not on file   Other Topics Concern  . Not on file   Social History Narrative    No family history on file.    Review of systems complete and found to be negative unless listed above      Physical Exam:  General: Well developed, well nourished, in no  acute distress HEENT:  Normocephalic and atramatic Neck:  No JVD.  Lungs: Clear bilaterally to auscultation and percussion. Heart: HRRR . Normal S1 and S2 without gallops or murmurs.  Abdomen: Bowel sounds are positive, abdomen soft and non-tender  Msk:  Back normal, normal gait. Normal strength and tone for age. Extremities: No clubbing, cyanosis or edema.   Neuro: Alert and oriented X 3. Psych:  Good affect, responds appropriately   Labs:   Lab Results  Component Value Date   WBC 10.4 07/20/2015   HGB 13.8 07/20/2015   HCT 41.8 07/20/2015   MCV 81.4 07/20/2015   PLT 348 07/20/2015    Recent Labs Lab 07/20/15 0759  CREATININE 0.83   Lab Results  Component Value Date   TROPONINI 0.94* 07/20/2015   No results found for: CHOL No results found for: HDL No results found for: LDLCALC No results found for: TRIG No results found for: CHOLHDL No results found for: LDLDIRECT    Radiology: Dg Hand Complete Right  06/30/2015  CLINICAL DATA:  Swelling in the right hand.  No reported injury. EXAM: RIGHT HAND - COMPLETE 3+ VIEW COMPARISON:  None. FINDINGS: No fracture, dislocation or suspicious focal osseous lesion. Mild osteoarthritis at distal interphalangeal joint of the right third finger. Minimal osteoarthritis at the third metacarpophalangeal joint. No joint erosions. IMPRESSION: Mild polyarticular osteoarthritis in the right hand as described.  Electronically Signed   By: Ilona Sorrel M.D.   On: 06/30/2015 21:15    EKG: Acute inferior ST elevation myocardial infarction  ASSESSMENT AND PLAN:   1. Acute inferior ST elevation myocardial infarction  Recommendations  1. Proceed to cardiac catheterization laboratory for immediate coronary angiography and possible percutaneous coronary intervention. The risks, benefits and alternatives were explained to the patient and informed written consent was obtained.  SignedIsaias Cowman MD,PhD, [facility] 07/20/2015, 9:32 AM

## 2015-07-20 NOTE — Progress Notes (Signed)
Dr. Saralyn Pilar paged regarding troponin.

## 2015-07-20 NOTE — ED Notes (Signed)
Pt reports CP that started last pm. Reports radiates into her left arm, right arm and back.

## 2015-07-21 LAB — CBC
HCT: 36.3 % (ref 35.0–47.0)
HEMOGLOBIN: 11.8 g/dL — AB (ref 12.0–16.0)
MCH: 26.6 pg (ref 26.0–34.0)
MCHC: 32.6 g/dL (ref 32.0–36.0)
MCV: 81.8 fL (ref 80.0–100.0)
PLATELETS: 305 10*3/uL (ref 150–440)
RBC: 4.44 MIL/uL (ref 3.80–5.20)
RDW: 15.5 % — ABNORMAL HIGH (ref 11.5–14.5)
WBC: 8.7 10*3/uL (ref 3.6–11.0)

## 2015-07-21 LAB — BASIC METABOLIC PANEL
ANION GAP: 7 (ref 5–15)
BUN: 10 mg/dL (ref 6–20)
CALCIUM: 8.4 mg/dL — AB (ref 8.9–10.3)
CHLORIDE: 104 mmol/L (ref 101–111)
CO2: 27 mmol/L (ref 22–32)
CREATININE: 0.68 mg/dL (ref 0.44–1.00)
GFR calc non Af Amer: >60 mL/min (ref >60)
Glucose, Bld: 104 mg/dL — ABNORMAL HIGH (ref 65–99)
Potassium: 3.7 mmol/L (ref 3.5–5.1)
SODIUM: 138 mmol/L (ref 135–145)

## 2015-07-21 LAB — TROPONIN I
TROPONIN I: 10.39 ng/mL — AB (ref <0.031)
Troponin I: 8.11 ng/mL — ABNORMAL HIGH (ref <0.031)

## 2015-07-21 LAB — GLUCOSE, CAPILLARY: GLUCOSE-CAPILLARY: 92 mg/dL (ref 65–99)

## 2015-07-21 MED ORDER — ATORVASTATIN CALCIUM 20 MG PO TABS
40.0000 mg | ORAL_TABLET | Freq: Every day | ORAL | Status: DC
Start: 2015-07-21 — End: 2015-07-22
  Administered 2015-07-21: 40 mg via ORAL
  Filled 2015-07-21: qty 2

## 2015-07-21 MED ORDER — ALUM & MAG HYDROXIDE-SIMETH 200-200-20 MG/5ML PO SUSP
15.0000 mL | ORAL | Status: DC | PRN
  Administered 2015-07-21: 15 mL via ORAL
  Filled 2015-07-21: qty 30

## 2015-07-21 NOTE — Progress Notes (Signed)
Patient in room from CCU. Vital signs stable, oriented to the room and safety contract signed, tele box verified with candace NT. Groin site WNL no signs of bleeding or hematoma noted. Will continue to monitor skin verified with [place].

## 2015-07-21 NOTE — Progress Notes (Addendum)
Pt was up in the chair at the start of shift. Pt able to use bedside commode, and ambulate back to bed. Tolerated well.

## 2015-07-21 NOTE — Progress Notes (Signed)
[facility] Cardiology  SUBJECTIVE: I don't have chest pain   Filed Vitals:   07/21/15 0300 07/21/15 0400 07/21/15 0500 07/21/15 0600  BP: 119/73 122/74 115/70 129/64  Pulse: 66 65 62 67  Temp:    98.5 F (36.9 C)  TempSrc:    Oral  Resp: 14 16 14 19   Height:      Weight:      SpO2: 96% 99% 98% 98%     Intake/Output Summary (Last 24 hours) at 07/21/15 [id number] Last data filed at 07/20/15 2200  Gross per 24 hour  Intake    843 ml  Output    350 ml  Net    493 ml      PHYSICAL EXAM  General: Well developed, well nourished, in no acute distress HEENT:  Normocephalic and atramatic Neck:  No JVD.  Lungs: Clear bilaterally to auscultation and percussion. Heart: HRRR . Normal S1 and S2 without gallops or murmurs.  Abdomen: Bowel sounds are positive, abdomen soft and non-tender  Msk:  Back normal, normal gait. Normal strength and tone for age. Extremities: No clubbing, cyanosis or edema.   Neuro: Alert and oriented X 3. Psych:  Good affect, responds appropriately   LABS: Basic Metabolic Panel:  Recent Labs  07/20/15 0759 07/21/15 0514  NA  --  138  K  --  3.7  CL  --  104  CO2  --  27  GLUCOSE  --  104*  BUN  --  10  CREATININE 0.83 0.68  CALCIUM  --  8.4*   Liver Function Tests: No results for input(s): AST, ALT, ALKPHOS, BILITOT, PROT, ALBUMIN in the last 72 hours. No results for input(s): LIPASE, AMYLASE in the last 72 hours. CBC:  Recent Labs  07/20/15 0759 07/21/15 0514  WBC 10.4 8.7  HGB 13.8 11.8*  HCT 41.8 36.3  MCV 81.4 81.8  PLT 348 305   Cardiac Enzymes:  Recent Labs  07/20/15 1726 07/20/15 2320 07/21/15 0514  TROPONINI 12.98* 10.39* 8.11*   BNP: Invalid input(s): POCBNP D-Dimer: No results for input(s): DDIMER in the last 72 hours. Hemoglobin A1C: No results for input(s): HGBA1C in the last 72 hours. Fasting Lipid Panel: No results for input(s): CHOL, HDL, LDLCALC, TRIG, CHOLHDL, LDLDIRECT in the last 72 hours. Thyroid Function  Tests: No results for input(s): TSH, T4TOTAL, T3FREE, THYROIDAB in the last 72 hours.  Invalid input(s): FREET3 Anemia Panel: No results for input(s): VITAMINB12, FOLATE, FERRITIN, TIBC, IRON, RETICCTPCT in the last 72 hours.  No results found.   Echo   TELEMETRY: Normal sinus rhythm  ASSESSMENT AND PLAN:  Active Problems:   ST elevation myocardial infarction (STEMI) of inferior wall (HCC)   ST elevation myocardial infarction (STEMI) of inferior wall, initial episode of care ([place])    1. Acute inferior STEMI, status post successful PCI and stent of proximal RCA, no recurrent chest pain 2. Essential hypertension  Recommendations  1. Continue current medications 2. Add atorvastatin 40 mg daily 3. Transfer to telemetry   [place], MD, PhD, [facility] 07/21/2015 7:21 AM

## 2015-07-21 NOTE — Progress Notes (Signed)
4AM assessment delayed until  0600 per patient preference.

## 2015-07-22 LAB — BASIC METABOLIC PANEL
Anion gap: 4 — ABNORMAL LOW (ref 5–15)
BUN: 12 mg/dL (ref 6–20)
CHLORIDE: 103 mmol/L (ref 101–111)
CO2: 29 mmol/L (ref 22–32)
CREATININE: 0.86 mg/dL (ref 0.44–1.00)
Calcium: 8.4 mg/dL — ABNORMAL LOW (ref 8.9–10.3)
GFR calc Af Amer: >60 mL/min (ref >60)
GFR calc non Af Amer: >60 mL/min (ref >60)
Glucose, Bld: 101 mg/dL — ABNORMAL HIGH (ref 65–99)
Potassium: 3.6 mmol/L (ref 3.5–5.1)
SODIUM: 136 mmol/L (ref 135–145)

## 2015-07-22 LAB — CBC
HCT: 36.3 % (ref 35.0–47.0)
Hemoglobin: 11.9 g/dL — ABNORMAL LOW (ref 12.0–16.0)
MCH: 26.8 pg (ref 26.0–34.0)
MCHC: 32.8 g/dL (ref 32.0–36.0)
MCV: 82 fL (ref 80.0–100.0)
PLATELETS: 311 10*3/uL (ref 150–440)
RBC: 4.43 MIL/uL (ref 3.80–5.20)
RDW: 15.8 % — AB (ref 11.5–14.5)
WBC: 9.5 10*3/uL (ref 3.6–11.0)

## 2015-07-22 MED ORDER — ENALAPRIL MALEATE 5 MG PO TABS: 5.0000 mg | ORAL_TABLET | Freq: Two times a day (BID) | ORAL | Status: DC

## 2015-07-22 MED ORDER — ATORVASTATIN CALCIUM 40 MG PO TABS: 40.0000 mg | ORAL_TABLET | Freq: Every day | ORAL | Status: DC

## 2015-07-22 MED ORDER — PREDNISONE 10 MG PO TABS: ORAL_TABLET | ORAL | Status: DC

## 2015-07-22 MED ORDER — ENALAPRIL MALEATE 5 MG PO TABS
5.0000 mg | ORAL_TABLET | Freq: Two times a day (BID) | ORAL | Status: DC
Start: 2015-07-22 — End: 2015-07-27

## 2015-07-22 MED ORDER — DIPHENHYDRAMINE HCL 25 MG PO CAPS: 25.0000 mg | ORAL_CAPSULE | Freq: Four times a day (QID) | ORAL | Status: DC | PRN

## 2015-07-22 MED ORDER — ASPIRIN 325 MG PO TBEC: 325.0000 mg | DELAYED_RELEASE_TABLET | Freq: Every day | ORAL | Status: DC

## 2015-07-22 MED ORDER — METOPROLOL TARTRATE 25 MG PO TABS: 25.0000 mg | ORAL_TABLET | Freq: Two times a day (BID) | ORAL | Status: DC

## 2015-07-22 MED ORDER — CLOPIDOGREL BISULFATE 75 MG PO TABS: 75.0000 mg | ORAL_TABLET | Freq: Every day | ORAL | Status: DC

## 2015-07-22 NOTE — Progress Notes (Signed)
Pt to be discharged today. Iv and tele removed. disch instructions and prescrips given to pt and her spouse. Work excuse provided. disch via w.c. Accompanied by husband.

## 2015-07-22 NOTE — Consult Note (Signed)
[facility] NAME: Sarah Medina    MR#:  [id number]  DATE OF BIRTH:  17-Sep-1965  DATE OF CONSULT:  07/22/2015  PRIMARY CARE PHYSICIAN: PROVIDER NOT IN SYSTEM   REQUESTING/REFERRING PHYSICIAN: Dr. Saralyn Pilar  CHIEF COMPLAINT:   Left eye swelling  HISTORY OF PRESENT ILLNESS:  Sarah Medina  is a 50 y.o. female with a known history of hyperlipidemia who presented to the hospital due to chest pain and noted to have a ST elevation MI. Patient is status post PCI and stent to the right coronary artery. Patient is postop day 2 from the cardiac catheterization and was noted today to have some left preseptal and periorbital swelling. Patient denies any tongue swelling, shortness of breath, chest pain, nausea, vomiting, headache, fever, chills or any other associated symptoms. Her swelling began earlier this morning. Hospitalist services were contacted for further treatment and evaluation.  PAST MEDICAL HISTORY:   Past Medical History  Diagnosis Date  . Hypercholesteremia     PAST SURGICAL HISTOIRY:   Past Surgical History  Procedure Laterality Date  . Cardiac catheterization N/A 07/20/2015    Procedure: Left Heart Cath and Coronary Angiography;  Surgeon: Isaias Cowman, MD;  Location: [facility];  Service: Cardiovascular;  Laterality: N/A;  . Cardiac catheterization N/A 07/20/2015    Procedure: Coronary Stent Intervention;  Surgeon: Isaias Cowman, MD;  Location: [facility];  Service: Cardiovascular;  Laterality: N/A;    SOCIAL HISTORY:   Social History  Substance Use Topics  . Smoking status: Never Smoker   . Smokeless tobacco: Not on file  . Alcohol Use: No    FAMILY HISTORY:  History reviewed. No pertinent family history.  DRUG ALLERGIES:  No Known Allergies  REVIEW OF SYSTEMS:   Review of Systems  Constitutional: Negative for fever, chills and weight loss.  HENT: Negative for  congestion, nosebleeds and tinnitus.   Eyes: Negative for blurred vision, double vision and redness.  Respiratory: Negative for cough, hemoptysis, shortness of breath and wheezing.   Cardiovascular: Negative for chest pain, orthopnea, leg swelling and PND.  Gastrointestinal: Negative for nausea, vomiting, abdominal pain, diarrhea and melena.  Genitourinary: Negative for dysuria, urgency and hematuria.  Musculoskeletal: Negative for joint pain and falls.  Neurological: Negative for dizziness, tingling, sensory change, focal weakness, seizures, weakness and headaches.  Endo/Heme/Allergies: Negative for polydipsia. Does not bruise/bleed easily.  Psychiatric/Behavioral: Negative for depression and memory loss. The patient is not nervous/anxious.   All other systems reviewed and are negative.    MEDICATIONS AT HOME:   Prior to Admission medications   Medication Sig Start Date End Date Taking? Authorizing Provider  hydrOXYzine (VISTARIL) 25 MG capsule Take 1 capsule (25 mg total) by mouth 3 (three) times daily as needed for itching. Take 1-2 tabs by mouth 3 (three) times a day as needed for itching 06/30/15  Yes Jenise V Bacon Menshew, PA-C  predniSONE (DELTASONE) 10 MG tablet Take 1 tablet (10 mg total) by mouth 2 (two) times daily with a meal. 06/24/15  Yes Jenise V Bacon Menshew, PA-C  ranitidine (ZANTAC) 150 MG tablet Take 1 tablet (150 mg total) by mouth 2 (two) times daily. 06/24/15  Yes Jenise V Bacon Menshew, PA-C  diphenhydrAMINE (BENADRYL) 25 mg capsule Take 1 capsule (25 mg total) by mouth every 6 (six) hours as needed. 07/22/15   Henreitta Leber, MD  predniSONE (DELTASONE) 10 MG tablet Label  & dispense according to the  schedule below. 4 Pills PO for 1 day, 3 Pills PO for 1 day, 2 Pills PO for 1 day, 1 Pill PO for 1 days then STOP. 07/22/15   Henreitta Leber, MD      VITAL SIGNS:  Blood pressure 108/55, pulse 64, temperature 98.2 F (36.8 C), temperature source Oral, resp. rate 16,  height 5\' 6"  (1.676 m), weight 127.007 kg (280 lb), last menstrual period 05/19/2015, SpO2 95 %.  PHYSICAL EXAMINATION:  GENERAL:  50 y.o.-year-old patient lying in the bed with no acute distress.  EYES: Pupils equal, round, reactive to light and accommodation. No scleral icterus. Extraocular muscles intact. Mild left periorbital and preseptal swelling. HEENT: Head atraumatic, normocephalic. Oropharynx and nasopharynx clear.  NECK:  Supple, no jugular venous distention. No thyroid enlargement, no tenderness.  LUNGS: Normal breath sounds bilaterally, no wheezing, rales, rhonchi . No use of accessory muscles of respiration.  CARDIOVASCULAR: S1, S2, RRR. No murmurs, rubs, gallops, clicks.  ABDOMEN: Soft, nontender, nondistended. Bowel sounds present. No organomegaly or mass.  EXTREMITIES: No pedal edema, cyanosis, or clubbing.  NEUROLOGIC: Cranial nerves II through XII are intact. No focal motor or sensory deficits appreciated bilaterally  PSYCHIATRIC: The patient is alert and oriented x 3. Good affect SKIN: No obvious rash, lesion, or ulcer.   LABORATORY PANEL:   CBC  Recent Labs Lab 07/22/15 0419  WBC 9.5  HGB 11.9*  HCT 36.3  PLT 311   ------------------------------------------------------------------------------------------------------------------  Chemistries   Recent Labs Lab 07/22/15 0419  NA 136  K 3.6  CL 103  CO2 29  GLUCOSE 101*  BUN 12  CREATININE 0.86  CALCIUM 8.4*   ------------------------------------------------------------------------------------------------------------------  Cardiac Enzymes  Recent Labs Lab 07/21/15 0514  TROPONINI 8.11*   ------------------------------------------------------------------------------------------------------------------  RADIOLOGY:  No results found.   IMPRESSION AND PLAN:   50 year old female with past medical history of hyperlipidemia who presented to the hospital with chest pain and was noted to have an ST  elevation MI. Patient is postop day 2 from PCI and right coronary artery stent placement. She was noted to have some mild left preseptal and periorbital swelling.  #1 left preseptal/periorbital swelling-I suspect this is probably a mild allergic reaction. She does not have any evidence of angioedema or anaphylaxis. She is hemodynamically stable. -There is no evidence of fever, redness or any evidence of cellulitis around her left eye. -I will discharge her on a mild prednisone taper and along with Benadryl as needed.  #2 STEMI status post PCI and stent to the right coronary artery-continue care as per cardiology. -No acute chest pain presently, continue aspirin, Plavix, statin, beta blocker, enalapril.  #3 hyperlipidemia-continue atorvastatin.  Patient is stable to be discharged from a medical perspective. This was discussed with the cardiologist over the phone.  All the records are reviewed and case discussed with Consulting provider. Management plans discussed with the patient, family and they are in agreement.  CODE STATUS: Full  TOTAL TIME TAKING CARE OF THIS PATIENT: 45 minutes.    Henreitta Leber M.D on 07/22/2015 at 9:46 AM  Between 7am to 6pm - Pager - [phone]  After 6pm go to [email or web address] - password [email or web address]  Office  [phone]  CC: Primary care Physician: PROVIDER NOT IN SYSTEM

## 2015-07-22 NOTE — Care Management (Signed)
Patient to be discharged today. Patient does not have health insurance.  [facility]  She has not applied for medication management.  Application provided.  Patient will be discharge on new medications.  Goodrx coupon printed out for each new medication, as well as highlighted ones that are available on the [employer] $4 list.  RNCM signing off

## 2015-07-22 NOTE — Discharge Summary (Signed)
Physician Discharge Summary  Patient ID: Sarah Medina MRN: [id number] DOB/AGE: May 15, 1966 50 y.o.  Admit date: 07/20/2015 Discharge date: 07/22/2015  Primary Discharge Diagnosis acute inferior ST elevation myocardial infarction Secondary Discharge Diagnosis essential hypertension  Significant Diagnostic Studies: cardiac graphics: Cardiac catheterization and yes  Consults: None  Hospital Course: The patient presented to [facility] emergency room 07/20/15 with prolonged episode of chest pain, EKG revealed ST elevation in the inferior leads consistent with acute inferior ST elevation myocardial infarction. The patient underwent emergency cardiac catheterization which revealed occluded proximal right coronary artery. The patient underwent percutaneous coronary intervention, receiving a 2.25 x 23 mm Xience Alpine stent with an excellent angiographic result. The patient was admitted to the ICU where she had an uncomplicated hospital stay, without recurrent chest pain. The patient did rule in for myocardial infarction with peak troponin of 12.98. On the morning of 07/22/15 the patient was ambulating without difficulty. She was discharged home in stable condition with scheduled follow-up in one week.   Discharge Exam: Blood pressure 108/70, pulse 56, temperature 98.2 F (36.8 C), temperature source Oral, resp. rate 16, height 5\' 6"  (1.676 m), weight 127.007 kg (280 lb), last menstrual period 05/19/2015, SpO2 99 %.   General appearance: alert Head: Normocephalic, without obvious abnormality, atraumatic Eyes: conjunctivae/corneas clear. PERRL, EOM's intact. Fundi benign. Ears: normal TM's and external ear canals both ears Nose: Nares normal. Septum midline. Mucosa normal. No drainage or sinus tenderness. Throat: lips, mucosa, and tongue normal; teeth and gums normal Neck: no adenopathy, no carotid bruit, no JVD, supple, symmetrical, trachea midline and thyroid not enlarged, symmetric, no  tenderness/mass/nodules Back: symmetric, no curvature. ROM normal. No CVA tenderness. Resp: clear to auscultation bilaterally Chest wall: no tenderness Breasts: normal appearance, no masses or tenderness Cardio: regular rate and rhythm, S1, S2 normal, no murmur, click, rub or gallop GI: soft, non-tender; bowel sounds normal; no masses,  no organomegaly Extremities: extremities normal, atraumatic, no cyanosis or edema Pulses: 2+ and symmetric Skin: Skin color, texture, turgor normal. No rashes or lesions Labs:   Lab Results  Component Value Date   WBC 9.5 07/22/2015   HGB 11.9* 07/22/2015   HCT 36.3 07/22/2015   MCV 82.0 07/22/2015   PLT 311 07/22/2015    Recent Labs Lab 07/22/15 0419  NA 136  K 3.6  CL 103  CO2 29  BUN 12  CREATININE 0.86  CALCIUM 8.4*  GLUCOSE 101*      Radiology:  EKG: Sinus rhythm  FOLLOW UP PLANS AND APPOINTMENTS    Medication List    TAKE these medications        aspirin 325 MG EC tablet  Take 1 tablet (325 mg total) by mouth daily.     atorvastatin 40 MG tablet  Commonly known as:  LIPITOR  Take 1 tablet (40 mg total) by mouth daily at 6 PM.     clopidogrel 75 MG tablet  Commonly known as:  PLAVIX  Take 1 tablet (75 mg total) by mouth daily with breakfast.     diphenhydrAMINE 25 mg capsule  Commonly known as:  BENADRYL  Take 1 capsule (25 mg total) by mouth every 6 (six) hours as needed.     enalapril 5 MG tablet  Commonly known as:  VASOTEC  Take 1 tablet (5 mg total) by mouth 2 (two) times daily.     hydrOXYzine 25 MG capsule  Commonly known as:  VISTARIL  Take 1 capsule (25 mg total) by mouth 3 (three)  times daily as needed for itching. Take 1-2 tabs by mouth 3 (three) times a day as needed for itching     metoprolol tartrate 25 MG tablet  Commonly known as:  LOPRESSOR  Take 1 tablet (25 mg total) by mouth 2 (two) times daily.     predniSONE 10 MG tablet  Commonly known as:  DELTASONE  Label  & dispense according to  the schedule below. 4 Pills PO for 1 day, 3 Pills PO for 1 day, 2 Pills PO for 1 day, 1 Pill PO for 1 days then STOP.     ranitidine 150 MG tablet  Commonly known as:  ZANTAC  Take 1 tablet (150 mg total) by mouth 2 (two) times daily.           Follow-up Information    Follow up with Sarah Philippi, MD In 1 week.   Specialty:  Cardiology   Contact information:   [facility] [place] [address] [phone]       BRING ALL MEDICATIONS WITH YOU TO FOLLOW UP APPOINTMENTS  Time spent with patient to include physician time: 25 minutes Signed:  Isaias Cowman MD, PhD, [facility] 07/22/2015, 12:14 PM

## 2015-07-23 ENCOUNTER — Ambulatory Visit (HOSPITAL_COMMUNITY): Admit: 2015-07-23 | Payer: Self-pay | Admitting: Cardiovascular Disease

## 2015-07-23 ENCOUNTER — Inpatient Hospital Stay (HOSPITAL_COMMUNITY)
Admission: EM | Admit: 2015-07-23 | Discharge: 2015-07-27 | DRG: 250 | Disposition: A | Discharge status: Home / Self Care | Payer: Medicaid Other | Attending: Cardiovascular Disease | Admitting: Cardiovascular Disease

## 2015-07-23 ENCOUNTER — Emergency Department (HOSPITAL_COMMUNITY): Payer: Medicaid Other

## 2015-07-23 ENCOUNTER — Encounter (HOSPITAL_COMMUNITY)
Admission: EM | Disposition: A | Discharge status: Home / Self Care | Payer: Self-pay | Source: Home / Self Care | Attending: Cardiovascular Disease

## 2015-07-23 ENCOUNTER — Encounter (HOSPITAL_COMMUNITY): Payer: Self-pay | Admitting: Cardiology

## 2015-07-23 DIAGNOSIS — Z7982 Long term (current) use of aspirin: Secondary | ICD-10-CM | POA: Diagnosis not present

## 2015-07-23 DIAGNOSIS — I2119 ST elevation (STEMI) myocardial infarction involving other coronary artery of inferior wall: Secondary | ICD-10-CM | POA: Diagnosis present

## 2015-07-23 DIAGNOSIS — T82867A Thrombosis of cardiac prosthetic devices, implants and grafts, initial encounter: Secondary | ICD-10-CM | POA: Diagnosis present

## 2015-07-23 DIAGNOSIS — I462 Cardiac arrest due to underlying cardiac condition: Secondary | ICD-10-CM | POA: Diagnosis present

## 2015-07-23 DIAGNOSIS — G9340 Encephalopathy, unspecified: Secondary | ICD-10-CM | POA: Diagnosis present

## 2015-07-23 DIAGNOSIS — E669 Obesity, unspecified: Secondary | ICD-10-CM | POA: Diagnosis present

## 2015-07-23 DIAGNOSIS — I4901 Ventricular fibrillation: Secondary | ICD-10-CM | POA: Diagnosis present

## 2015-07-23 DIAGNOSIS — K7200 Acute and subacute hepatic failure without coma: Secondary | ICD-10-CM | POA: Diagnosis not present

## 2015-07-23 DIAGNOSIS — Y718 Miscellaneous cardiovascular devices associated with adverse incidents, not elsewhere classified: Secondary | ICD-10-CM | POA: Diagnosis present

## 2015-07-23 DIAGNOSIS — I472 Ventricular tachycardia: Secondary | ICD-10-CM | POA: Diagnosis not present

## 2015-07-23 DIAGNOSIS — J9690 Respiratory failure, unspecified, unspecified whether with hypoxia or hypercapnia: Secondary | ICD-10-CM

## 2015-07-23 DIAGNOSIS — I221 Subsequent ST elevation (STEMI) myocardial infarction of inferior wall: Principal | ICD-10-CM | POA: Diagnosis present

## 2015-07-23 DIAGNOSIS — D62 Acute posthemorrhagic anemia: Secondary | ICD-10-CM | POA: Diagnosis not present

## 2015-07-23 DIAGNOSIS — I2111 ST elevation (STEMI) myocardial infarction involving right coronary artery: Secondary | ICD-10-CM

## 2015-07-23 DIAGNOSIS — I469 Cardiac arrest, cause unspecified: Secondary | ICD-10-CM

## 2015-07-23 DIAGNOSIS — R739 Hyperglycemia, unspecified: Secondary | ICD-10-CM | POA: Diagnosis not present

## 2015-07-23 DIAGNOSIS — Z7902 Long term (current) use of antithrombotics/antiplatelets: Secondary | ICD-10-CM | POA: Diagnosis not present

## 2015-07-23 DIAGNOSIS — I2510 Atherosclerotic heart disease of native coronary artery without angina pectoris: Secondary | ICD-10-CM | POA: Diagnosis present

## 2015-07-23 DIAGNOSIS — R490 Dysphonia: Secondary | ICD-10-CM | POA: Diagnosis not present

## 2015-07-23 DIAGNOSIS — Z6841 Body Mass Index (BMI) 40.0 and over, adult: Secondary | ICD-10-CM | POA: Diagnosis not present

## 2015-07-23 DIAGNOSIS — IMO0002 Reserved for concepts with insufficient information to code with codable children: Secondary | ICD-10-CM

## 2015-07-23 DIAGNOSIS — T82855A Stenosis of coronary artery stent, initial encounter: Secondary | ICD-10-CM | POA: Diagnosis present

## 2015-07-23 DIAGNOSIS — E785 Hyperlipidemia, unspecified: Secondary | ICD-10-CM | POA: Diagnosis present

## 2015-07-23 DIAGNOSIS — I213 ST elevation (STEMI) myocardial infarction of unspecified site: Secondary | ICD-10-CM

## 2015-07-23 DIAGNOSIS — I25119 Atherosclerotic heart disease of native coronary artery with unspecified angina pectoris: Secondary | ICD-10-CM | POA: Diagnosis present

## 2015-07-23 DIAGNOSIS — Z9289 Personal history of other medical treatment: Secondary | ICD-10-CM

## 2015-07-23 DIAGNOSIS — R079 Chest pain, unspecified: Secondary | ICD-10-CM | POA: Diagnosis present

## 2015-07-23 DIAGNOSIS — J9602 Acute respiratory failure with hypercapnia: Secondary | ICD-10-CM

## 2015-07-23 HISTORY — DX: ST elevation (STEMI) myocardial infarction of unspecified site: I21.3

## 2015-07-23 HISTORY — PX: CARDIAC CATHETERIZATION: SHX172

## 2015-07-23 HISTORY — DX: Atherosclerotic heart disease of native coronary artery without angina pectoris: I25.10

## 2015-07-23 HISTORY — DX: Reserved for concepts with insufficient information to code with codable children: IMO0002

## 2015-07-23 HISTORY — DX: Localized edema: R60.0

## 2015-07-23 HISTORY — DX: Cardiac arrest, cause unspecified: I46.9

## 2015-07-23 HISTORY — DX: Ventricular fibrillation: I49.01

## 2015-07-23 LAB — COMPREHENSIVE METABOLIC PANEL
ALK PHOS: 64 U/L (ref 38–126)
ALT: 70 U/L — ABNORMAL HIGH (ref 14–54)
AST: 129 U/L — ABNORMAL HIGH (ref 15–41)
Albumin: 2.4 g/dL — ABNORMAL LOW (ref 3.5–5.0)
Anion gap: 9 (ref 5–15)
BILIRUBIN TOTAL: 0.5 mg/dL (ref 0.3–1.2)
BUN: 11 mg/dL (ref 6–20)
CHLORIDE: 105 mmol/L (ref 101–111)
CO2: 25 mmol/L (ref 22–32)
CREATININE: 0.8 mg/dL (ref 0.44–1.00)
Calcium: 8.2 mg/dL — ABNORMAL LOW (ref 8.9–10.3)
GFR calc Af Amer: >60 mL/min (ref >60)
Glucose, Bld: 105 mg/dL — ABNORMAL HIGH (ref 65–99)
POTASSIUM: 3.7 mmol/L (ref 3.5–5.1)
Sodium: 139 mmol/L (ref 135–145)
Total Protein: 5.6 g/dL — ABNORMAL LOW (ref 6.5–8.1)

## 2015-07-23 LAB — TRIGLYCERIDES: TRIGLYCERIDES: 167 mg/dL — AB (ref <150)

## 2015-07-23 LAB — POCT I-STAT 3, ART BLOOD GAS (G3+)
Acid-Base Excess: 2 mmol/L (ref 0.0–2.0)
BICARBONATE: 27 meq/L — AB (ref 20.0–24.0)
O2 Saturation: 100 %
PCO2 ART: 42.3 mmHg (ref 35.0–45.0)
PO2 ART: 387 mmHg — AB (ref 80.0–100.0)
TCO2: 28 mmol/L (ref 0–100)
pH, Arterial: 7.413 (ref 7.350–7.450)

## 2015-07-23 LAB — TSH: TSH: 1.76 u[IU]/mL (ref 0.350–4.500)

## 2015-07-23 LAB — CBC
HEMATOCRIT: 30.5 % — AB (ref 36.0–46.0)
HEMOGLOBIN: 10 g/dL — AB (ref 12.0–15.0)
MCH: 26.8 pg (ref 26.0–34.0)
MCHC: 32.8 g/dL (ref 30.0–36.0)
MCV: 81.8 fL (ref 78.0–100.0)
Platelets: 316 10*3/uL (ref 150–400)
RBC: 3.73 MIL/uL — ABNORMAL LOW (ref 3.87–5.11)
RDW: 15.2 % (ref 11.5–15.5)
WBC: 9.9 10*3/uL (ref 4.0–10.5)

## 2015-07-23 LAB — I-STAT CHEM 8, ED
BUN: 18 mg/dL (ref 6–20)
CALCIUM ION: 1.16 mmol/L (ref 1.12–1.23)
CHLORIDE: 102 mmol/L (ref 101–111)
Creatinine, Ser: 0.9 mg/dL (ref 0.44–1.00)
GLUCOSE: 120 mg/dL — AB (ref 65–99)
HCT: 47 % — ABNORMAL HIGH (ref 36.0–46.0)
Hemoglobin: 16 g/dL — ABNORMAL HIGH (ref 12.0–15.0)
POTASSIUM: 3.8 mmol/L (ref 3.5–5.1)
Sodium: 139 mmol/L (ref 135–145)
TCO2: 25 mmol/L (ref 0–100)

## 2015-07-23 LAB — PROTIME-INR
INR: 1.63 — ABNORMAL HIGH (ref 0.00–1.49)
PROTHROMBIN TIME: 19.4 s — AB (ref 11.6–15.2)

## 2015-07-23 LAB — POCT ACTIVATED CLOTTING TIME: Activated Clotting Time: 399 seconds

## 2015-07-23 LAB — I-STAT TROPONIN, ED: TROPONIN I, POC: 1.12 ng/mL — AB (ref 0.00–0.08)

## 2015-07-23 LAB — MAGNESIUM: Magnesium: 1.8 mg/dL (ref 1.7–2.4)

## 2015-07-23 LAB — T4, FREE: Free T4: 1 ng/dL (ref 0.61–1.12)

## 2015-07-23 LAB — TROPONIN I: TROPONIN I: 11.87 ng/mL — AB (ref <0.031)

## 2015-07-23 LAB — MRSA PCR SCREENING: MRSA by PCR: NEGATIVE

## 2015-07-23 SURGERY — LEFT HEART CATH AND CORONARY ANGIOGRAPHY

## 2015-07-23 MED ORDER — SUCCINYLCHOLINE CHLORIDE 20 MG/ML IJ SOLN
INTRAMUSCULAR | Status: AC | PRN
Start: 2015-07-23 — End: 2015-07-23
  Administered 2015-07-23: 150 mg via INTRAVENOUS

## 2015-07-23 MED ORDER — MIDAZOLAM HCL 2 MG/2ML IJ SOLN: 1.0000 mg | INTRAMUSCULAR | Status: DC | PRN

## 2015-07-23 MED ORDER — SODIUM CHLORIDE 0.9 % IJ SOLN: 3.0000 mL | INTRAMUSCULAR | Status: DC | PRN

## 2015-07-23 MED ORDER — ASPIRIN 81 MG PO CHEW
81.0000 mg | CHEWABLE_TABLET | Freq: Every day | ORAL | Status: DC
  Administered 2015-07-24 – 2015-07-27 (×4): 81 mg via ORAL
  Filled 2015-07-23 (×4): qty 1

## 2015-07-23 MED ORDER — ZOLPIDEM TARTRATE 5 MG PO TABS: 5.0000 mg | ORAL_TABLET | Freq: Every evening | ORAL | Status: DC | PRN

## 2015-07-23 MED ORDER — TIROFIBAN (AGGRASTAT) BOLUS VIA INFUSION
INTRAVENOUS | Status: DC | PRN
  Administered 2015-07-23: 3175 ug via INTRAVENOUS

## 2015-07-23 MED ORDER — PRASUGREL HCL 10 MG PO TABS
ORAL_TABLET | ORAL | Status: AC
  Filled 2015-07-23: qty 1

## 2015-07-23 MED ORDER — FENTANYL CITRATE (PF) 100 MCG/2ML IJ SOLN
INTRAMUSCULAR | Status: AC
  Filled 2015-07-23: qty 2

## 2015-07-23 MED ORDER — AMIODARONE LOAD VIA INFUSION
150.0000 mg | Freq: Once | INTRAVENOUS | Status: DC
Start: 2015-07-23 — End: 2015-07-25
  Filled 2015-07-23: qty 83.34

## 2015-07-23 MED ORDER — SODIUM CHLORIDE 0.9 % IV SOLN: 25.0000 ug/h | INTRAVENOUS | Status: DC

## 2015-07-23 MED ORDER — NITROGLYCERIN 0.4 MG SL SUBL: 0.4000 mg | SUBLINGUAL_TABLET | SUBLINGUAL | Status: DC | PRN

## 2015-07-23 MED ORDER — MORPHINE SULFATE (PF) 2 MG/ML IV SOLN: 2.0000 mg | INTRAVENOUS | Status: DC | PRN

## 2015-07-23 MED ORDER — ASPIRIN 81 MG PO CHEW: 324.0000 mg | CHEWABLE_TABLET | ORAL | Status: AC

## 2015-07-23 MED ORDER — SODIUM CHLORIDE 0.9 % IV SOLN: INTRAVENOUS | Status: DC

## 2015-07-23 MED ORDER — ENALAPRIL MALEATE 5 MG PO TABS
5.0000 mg | ORAL_TABLET | Freq: Two times a day (BID) | ORAL | Status: DC
  Filled 2015-07-23: qty 1

## 2015-07-23 MED ORDER — METOPROLOL TARTRATE 25 MG PO TABS
25.0000 mg | ORAL_TABLET | Freq: Two times a day (BID) | ORAL | Status: DC
  Administered 2015-07-23 – 2015-07-25 (×5): 25 mg via ORAL
  Filled 2015-07-23 (×5): qty 1

## 2015-07-23 MED ORDER — PANTOPRAZOLE SODIUM 40 MG IV SOLR
40.0000 mg | INTRAVENOUS | Status: DC
  Administered 2015-07-23: 40 mg via INTRAVENOUS
  Filled 2015-07-23: qty 40

## 2015-07-23 MED ORDER — ACETAMINOPHEN 325 MG PO TABS: 650.0000 mg | ORAL_TABLET | ORAL | Status: DC | PRN

## 2015-07-23 MED ORDER — PRASUGREL HCL 10 MG PO TABS
ORAL_TABLET | ORAL | Status: DC | PRN
  Administered 2015-07-23: 60 mg via NASOGASTRIC

## 2015-07-23 MED ORDER — FENTANYL CITRATE (PF) 100 MCG/2ML IJ SOLN
INTRAMUSCULAR | Status: AC
Start: 2015-07-23 — End: 2015-07-24
  Filled 2015-07-23: qty 4

## 2015-07-23 MED ORDER — HEPARIN (PORCINE) IN NACL 2-0.9 UNIT/ML-% IJ SOLN
INTRAMUSCULAR | Status: AC
  Filled 2015-07-23: qty 500

## 2015-07-23 MED ORDER — PROPOFOL 1000 MG/100ML IV EMUL
5.0000 ug/kg/min | INTRAVENOUS | Status: DC
  Administered 2015-07-23: 30 ug/kg/min via INTRAVENOUS
  Administered 2015-07-24 (×3): 25 ug/kg/min via INTRAVENOUS
  Filled 2015-07-23 (×4): qty 100

## 2015-07-23 MED ORDER — NITROGLYCERIN 1 MG/10 ML FOR IR/CATH LAB
INTRA_ARTERIAL | Status: AC
  Filled 2015-07-23: qty 10

## 2015-07-23 MED ORDER — CANGRELOR BOLUS VIA INFUSION
INTRAVENOUS | Status: DC | PRN
  Administered 2015-07-23: 3810 ug via INTRAVENOUS

## 2015-07-23 MED ORDER — FENTANYL CITRATE (PF) 100 MCG/2ML IJ SOLN
INTRAMUSCULAR | Status: DC | PRN
  Administered 2015-07-23: 50 ug via INTRAVENOUS
  Administered 2015-07-23: 150 ug via INTRAVENOUS

## 2015-07-23 MED ORDER — TIROFIBAN HCL IN NACL 5-0.9 MG/100ML-% IV SOLN: INTRAVENOUS | Status: DC | PRN

## 2015-07-23 MED ORDER — FENTANYL BOLUS VIA INFUSION
50.0000 ug | INTRAVENOUS | Status: DC | PRN
  Filled 2015-07-23: qty 50

## 2015-07-23 MED ORDER — CANGRELOR TETRASODIUM 50 MG IV SOLR
INTRAVENOUS | Status: AC
Start: 2015-07-23 — End: 2015-07-23
  Filled 2015-07-23: qty 50

## 2015-07-23 MED ORDER — DOPAMINE-DEXTROSE 3.2-5 MG/ML-% IV SOLN
INTRAVENOUS | Status: DC | PRN
  Administered 2015-07-23: 5 ug/kg/min via INTRAVENOUS

## 2015-07-23 MED ORDER — DOPAMINE-DEXTROSE 3.2-5 MG/ML-% IV SOLN
INTRAVENOUS | Status: AC
  Filled 2015-07-23: qty 250

## 2015-07-23 MED ORDER — CLOPIDOGREL BISULFATE 75 MG PO TABS: 75.0000 mg | ORAL_TABLET | Freq: Every day | ORAL | Status: DC

## 2015-07-23 MED ORDER — PRASUGREL HCL 10 MG PO TABS
10.0000 mg | ORAL_TABLET | Freq: Every day | ORAL | Status: DC
  Administered 2015-07-24 – 2015-07-27 (×4): 10 mg via ORAL
  Filled 2015-07-23 (×4): qty 1

## 2015-07-23 MED ORDER — NITROGLYCERIN 1 MG/10 ML FOR IR/CATH LAB
INTRA_ARTERIAL | Status: DC | PRN
  Administered 2015-07-23: 16:00:00

## 2015-07-23 MED ORDER — ALPRAZOLAM 0.25 MG PO TABS: 0.2500 mg | ORAL_TABLET | Freq: Two times a day (BID) | ORAL | Status: DC | PRN

## 2015-07-23 MED ORDER — DEXTROSE 5 % IV SOLN
300.0000 mg | INTRAVENOUS | Status: AC | PRN
  Administered 2015-07-23: 300 mg via INTRAVENOUS

## 2015-07-23 MED ORDER — ATORVASTATIN CALCIUM 80 MG PO TABS
80.0000 mg | ORAL_TABLET | Freq: Every day | ORAL | Status: DC
  Administered 2015-07-23 – 2015-07-26 (×4): 80 mg via ORAL
  Filled 2015-07-23 (×4): qty 1

## 2015-07-23 MED ORDER — "THROMBI-PAD 3""X3"" EX PADS"
2.0000 | MEDICATED_PAD | Freq: Once | CUTANEOUS | Status: AC
  Administered 2015-07-23: 2 via TOPICAL
  Filled 2015-07-23: qty 2

## 2015-07-23 MED ORDER — SODIUM CHLORIDE 0.9 % IJ SOLN: 3.0000 mL | Freq: Two times a day (BID) | INTRAMUSCULAR | Status: DC

## 2015-07-23 MED ORDER — FENTANYL CITRATE (PF) 100 MCG/2ML IJ SOLN
INTRAMUSCULAR | Status: AC | PRN
  Administered 2015-07-23: 100 ug via INTRAVENOUS

## 2015-07-23 MED ORDER — TIROFIBAN HCL IN NACL 5-0.9 MG/100ML-% IV SOLN
0.1500 ug/kg/min | INTRAVENOUS | Status: DC
  Administered 2015-07-23 – 2015-07-24 (×4): 0.15 ug/kg/min via INTRAVENOUS
  Filled 2015-07-23 (×3): qty 100

## 2015-07-23 MED ORDER — ATORVASTATIN CALCIUM 80 MG PO TABS: 80.0000 mg | ORAL_TABLET | Freq: Every day | ORAL | Status: DC

## 2015-07-23 MED ORDER — SODIUM CHLORIDE 0.9 % IV SOLN: 250.0000 mL | INTRAVENOUS | Status: DC | PRN

## 2015-07-23 MED ORDER — PROPOFOL 1000 MG/100ML IV EMUL
0.0000 ug/kg/min | INTRAVENOUS | Status: DC
  Administered 2015-07-23: 20 ug/kg/min via INTRAVENOUS

## 2015-07-23 MED ORDER — SODIUM CHLORIDE 0.9 % IV SOLN
25.0000 ug/h | INTRAVENOUS | Status: DC
  Filled 2015-07-23: qty 50

## 2015-07-23 MED ORDER — ONDANSETRON HCL 4 MG/2ML IJ SOLN: 4.0000 mg | Freq: Four times a day (QID) | INTRAMUSCULAR | Status: DC | PRN

## 2015-07-23 MED ORDER — SODIUM CHLORIDE 0.9 % IV SOLN
1.0000 mg/kg/h | INTRAVENOUS | Status: AC
  Administered 2015-07-23: 1 mg/kg/h via INTRAVENOUS
  Filled 2015-07-23 (×3): qty 250

## 2015-07-23 MED ORDER — LIDOCAINE HCL (PF) 1 % IJ SOLN
INTRAMUSCULAR | Status: AC
  Filled 2015-07-23: qty 30

## 2015-07-23 MED ORDER — FENTANYL CITRATE (PF) 100 MCG/2ML IJ SOLN
50.0000 ug | Freq: Once | INTRAMUSCULAR | Status: DC
  Filled 2015-07-23: qty 2

## 2015-07-23 MED ORDER — TIROFIBAN HCL IV 12.5 MG/250 ML
INTRAVENOUS | Status: AC
  Filled 2015-07-23: qty 250

## 2015-07-23 MED ORDER — ETOMIDATE 2 MG/ML IV SOLN
INTRAVENOUS | Status: AC | PRN
  Administered 2015-07-23: 20 mg via INTRAVENOUS

## 2015-07-23 MED ORDER — ALBUTEROL SULFATE (2.5 MG/3ML) 0.083% IN NEBU: 3.0000 mL | INHALATION_SOLUTION | RESPIRATORY_TRACT | Status: DC | PRN

## 2015-07-23 MED ORDER — AMIODARONE HCL IN DEXTROSE 360-4.14 MG/200ML-% IV SOLN
30.0000 mg/h | INTRAVENOUS | Status: DC
  Administered 2015-07-23 – 2015-07-25 (×5): 30 mg/h via INTRAVENOUS
  Filled 2015-07-23 (×3): qty 200

## 2015-07-23 MED ORDER — BIVALIRUDIN 250 MG IV SOLR
INTRAVENOUS | Status: AC
Start: 2015-07-23 — End: 2015-07-23
  Filled 2015-07-23: qty 250

## 2015-07-23 MED ORDER — ASPIRIN 300 MG RE SUPP: 300.0000 mg | RECTAL | Status: AC

## 2015-07-23 MED ORDER — AMIODARONE HCL 150 MG/3ML IV SOLN
INTRAVENOUS | Status: AC
  Filled 2015-07-23: qty 3

## 2015-07-23 MED ORDER — HEPARIN (PORCINE) IN NACL 2-0.9 UNIT/ML-% IJ SOLN
INTRAMUSCULAR | Status: AC
  Filled 2015-07-23: qty 1000

## 2015-07-23 MED ORDER — AMIODARONE HCL 150 MG/3ML IV SOLN
INTRAVENOUS | Status: DC | PRN
Start: 2015-07-23 — End: 2015-07-23
  Administered 2015-07-23: 300 mg via INTRAVENOUS

## 2015-07-23 MED ORDER — AMIODARONE HCL IN DEXTROSE 360-4.14 MG/200ML-% IV SOLN
60.0000 mg/h | INTRAVENOUS | Status: DC
  Administered 2015-07-23: 60 mg/h via INTRAVENOUS
  Filled 2015-07-23: qty 200

## 2015-07-23 MED ORDER — PROPOFOL 10 MG/ML IV BOLUS
INTRAVENOUS | Status: AC | PRN
  Administered 2015-07-23: 20 mg via INTRAVENOUS

## 2015-07-23 MED ORDER — PROPOFOL 1000 MG/100ML IV EMUL
INTRAVENOUS | Status: AC
  Administered 2015-07-23: 20 ug/kg/min via INTRAVENOUS
  Filled 2015-07-23: qty 100

## 2015-07-23 MED ORDER — BIVALIRUDIN BOLUS VIA INFUSION - CUPID
INTRAVENOUS | Status: DC | PRN
  Administered 2015-07-23: 95.25 mg via INTRAVENOUS

## 2015-07-23 MED ORDER — CHLORHEXIDINE GLUCONATE 0.12% ORAL RINSE (MEDLINE KIT)
15.0000 mL | Freq: Two times a day (BID) | OROMUCOSAL | Status: DC
Start: 2015-07-23 — End: 2015-07-24
  Administered 2015-07-23 – 2015-07-24 (×2): 15 mL via OROMUCOSAL

## 2015-07-23 MED ORDER — ASPIRIN EC 81 MG PO TBEC: 81.0000 mg | DELAYED_RELEASE_TABLET | Freq: Every day | ORAL | Status: DC

## 2015-07-23 MED ORDER — ANTISEPTIC ORAL RINSE SOLUTION (CORINZ)
7.0000 mL | OROMUCOSAL | Status: DC
  Administered 2015-07-23 – 2015-07-24 (×7): 7 mL via OROMUCOSAL

## 2015-07-23 MED ORDER — SODIUM CHLORIDE 0.9 % IV SOLN
INTRAVENOUS | Status: AC
  Administered 2015-07-23: 17:00:00 via INTRAVENOUS

## 2015-07-23 MED ORDER — SODIUM CHLORIDE 0.9 % IV SOLN
1.0000 mg/h | INTRAVENOUS | Status: DC
  Administered 2015-07-23: 2 mg/h via INTRAVENOUS
  Filled 2015-07-23: qty 10

## 2015-07-23 MED ORDER — IOHEXOL 350 MG/ML SOLN
INTRAVENOUS | Status: DC | PRN
  Administered 2015-07-23: 95 mL via INTRACARDIAC

## 2015-07-23 MED ORDER — SODIUM CHLORIDE 0.9 % IV SOLN
50000.0000 ug | INTRAVENOUS | Status: DC | PRN
  Administered 2015-07-23: 4 ug/kg/min via INTRAVENOUS

## 2015-07-23 MED ORDER — DIPHENHYDRAMINE HCL 25 MG PO CAPS: 25.0000 mg | ORAL_CAPSULE | Freq: Four times a day (QID) | ORAL | Status: DC | PRN

## 2015-07-23 MED ORDER — SODIUM CHLORIDE 0.9 % IV SOLN
250.0000 mg | INTRAVENOUS | Status: DC | PRN
  Administered 2015-07-23: 1.75 mg/kg/h via INTRAVENOUS

## 2015-07-23 MED ORDER — HEPARIN SODIUM (PORCINE) 5000 UNIT/ML IJ SOLN
4000.0000 [IU] | INTRAMUSCULAR | Status: AC
  Administered 2015-07-23: 4000 [IU] via INTRAVENOUS
  Filled 2015-07-23: qty 1

## 2015-07-23 SURGICAL SUPPLY — 18 items
BALLN EUPHORA RX 2.0X12 (BALLOONS) ×3
BALLN ~~LOC~~ EMERGE MR 2.5X15 (BALLOONS) ×3
BALLOON EUPHORA RX 2.0X12 (BALLOONS) ×1 IMPLANT
BALLOON ~~LOC~~ EMERGE MR 2.5X15 (BALLOONS) ×1 IMPLANT
CATH INFINITI 5FR MULTPACK ANG (CATHETERS) ×3 IMPLANT
CATH SITESEER 5F NTR (CATHETERS) ×3 IMPLANT
CATH VISTA GUIDE 6FR JR4 (CATHETERS) ×3 IMPLANT
HOVERMATT SINGLE USE (MISCELLANEOUS) ×3 IMPLANT
KIT ENCORE 26 ADVANTAGE (KITS) ×3 IMPLANT
KIT HEART LEFT (KITS) ×3 IMPLANT
PACK CARDIAC CATHETERIZATION (CUSTOM PROCEDURE TRAY) ×3 IMPLANT
SHEATH PINNACLE 6F 10CM (SHEATH) ×6 IMPLANT
SYR MEDRAD MARK V 150ML (SYRINGE) ×3 IMPLANT
TRANSDUCER W/STOPCOCK (MISCELLANEOUS) ×3 IMPLANT
TUBING CIL FLEX 10 FLL-RA (TUBING) ×3 IMPLANT
WIRE ASAHI PROWATER 180CM (WIRE) ×6 IMPLANT
WIRE EMERALD 3MM-J .035X150CM (WIRE) ×3 IMPLANT
WIRE SAFE-T 1.5MM-J .035X260CM (WIRE) ×3 IMPLANT

## 2015-07-23 NOTE — Code Documentation (Addendum)
As patient wheeled into patient room, pt coded in presence of MD Tomi Bamberger and this RN. CPR initiated. Pt here for worsening chest pain. Treated for STEMI 3 days ago. Pt was a/o x4 on arrival to room and suddenly became unresponsive without pulses. Pt placed on zoll and monitor. CPR in progress at this time.

## 2015-07-23 NOTE — Progress Notes (Signed)
[facility] Progress Note Patient Name: Brittyn Klecha DOB: 16-Aug-1965 MRN: [id number]   Date of Service  07/23/2015  HPI/Events of Note  Ongoing bleeding from line sites.  eICU Interventions  Plan: Apply Thrombi Pad to sites Continue to monitor     Intervention Category Intermediate Interventions: Bleeding - evaluation and treatment with blood products  Jami Ohlin 07/23/2015, 10:24 PM

## 2015-07-23 NOTE — Progress Notes (Signed)
CRITICAL VALUE ALERT  Critical value received:  Troponin 11.87  Date of notification:  07/23/15  Time of notification:  2300  Critical value read back:Yes.    Nurse who received alert:  Ezekiel Slocumb, RN  MD notified (1st page):  Dr. Susy Manor  Time of first page:  2305  Responding MD:  Dr. Susy Manor  Time MD responded:  [phone]

## 2015-07-23 NOTE — H&P (Signed)
Sarah Medina is an 50 y.o. female.    Primary Cardiologist:Dr. Paraschos  PROVIDER NOT IN SYSTEM  Chief Complaint: chest pain HPI: 50 year old female with STEMI 07/20/15 inf MI on EKG and underwent emergent Cath with prox occ of codominate rt coronary artery with successful PCI with Xience Alpine stent to prox RCA.  She was discharged from [facility] 07/22/15 Her pk troponin was 12.98.  D/c'd with plavix, asa 325, lipitor 40, enalapril, BB, and prednisone but decreasing doses- due to facial preseptal and periorbital swelling 2 days post cath.  Not angioedema.  Today developed chest pain at home EMS called STEMI Called but could not go to [place] due to emergency there in cath lab so to [facility], she had ASA and on arrival here she still had some chest pain. While sliding from ems stretcher to ER stretcher she had V Fib arrest and shocked X 1 to SR.  Initial EKG here SR with no inf ST elevation.  + V3 ST elevation.  Once intubated and sedated follow up EKG with ST elevation in inf leads and V3.  Dr. Gwenlyn Found has reviewed cath films and 80% LAD stenosis at time of  previous cath.    Pt headed to cath lab on propofol drip and she has fentanyl prn.  Have asked Pulmomary to assist with vent.  She was out only 1 min at most and most likely less with v fib.  No hypothermic protocol.   Past Medical History  Diagnosis Date  . Hypercholesteremia   . H/O myocardial infarction less than 8 weeks- STEMI inf wall 07/20/15- stent to RCA 07/20/15    inf wall STEMI with DES to RCA  . CAD in native artery, RCA, LAD and LCX 07/23/2015  . STEMI (ST elevation myocardial infarction) ([place]) 07/23/15  . Periorbital edema     treated with steroids    Past Surgical History  Procedure Laterality Date  . Cardiac catheterization N/A 07/20/2015    Procedure: Left Heart Cath and Coronary Angiography;  Surgeon: Isaias Cowman, MD;  Location: [place] [facility];  Service: Cardiovascular;  Laterality: N/A;    . Cardiac catheterization N/A 07/20/2015    Procedure: Coronary Stent Intervention;  Surgeon: Isaias Cowman, MD;  Location: [place] [facility];  Service: Cardiovascular;  Laterality: N/A;    No family history on file.pt unable to respond at this time. Social History:  reports that she has never smoked. She does not have any smokeless tobacco history on file. She reports that she does not drink alcohol. Her drug history is not on file.  Allergies: No Known Allergies  OUTPATIENT MEDICATIONS: No current facility-administered medications on file prior to encounter.   Current Outpatient Prescriptions on File Prior to Encounter  Medication Sig Dispense Refill  . aspirin EC 325 MG EC tablet Take 1 tablet (325 mg total) by mouth daily. 30 tablet 0  . atorvastatin (LIPITOR) 40 MG tablet Take 1 tablet (40 mg total) by mouth daily at 6 PM. 30 tablet 5  . clopidogrel (PLAVIX) 75 MG tablet Take 1 tablet (75 mg total) by mouth daily with breakfast. 30 tablet 5  . diphenhydrAMINE (BENADRYL) 25 mg capsule Take 1 capsule (25 mg total) by mouth every 6 (six) hours as needed. 30 capsule 0  . enalapril (VASOTEC) 5 MG tablet Take 1 tablet (5 mg total) by mouth 2 (two) times daily. 60 tablet 5  . hydrOXYzine (VISTARIL) 25 MG capsule Take 1  capsule (25 mg total) by mouth 3 (three) times daily as needed for itching. Take 1-2 tabs by mouth 3 (three) times a day as needed for itching 30 capsule 0  . metoprolol tartrate (LOPRESSOR) 25 MG tablet Take 1 tablet (25 mg total) by mouth 2 (two) times daily. 60 tablet 5  . predniSONE (DELTASONE) 10 MG tablet Label  & dispense according to the schedule below. 4 Pills PO for 1 day, 3 Pills PO for 1 day, 2 Pills PO for 1 day, 1 Pill PO for 1 days then STOP. 10 tablet 0  . ranitidine (ZANTAC) 150 MG tablet Take 1 tablet (150 mg total) by mouth 2 (two) times daily. 20 tablet 0  . [DISCONTINUED] cyproheptadine (PERIACTIN) 4 MG tablet Take 1 tablet (4 mg total) by mouth 3  (three) times daily as needed for allergies. 30 tablet 0     Results for orders placed or performed during the hospital encounter of 07/23/15 (from the past 48 hour(s))  I-Stat Troponin, ED (not at [facility])     Status: Abnormal   Collection Time: 07/23/15  1:45 PM  Result Value Ref Range   Troponin i, poc 1.12 (HH) 0.00 - 0.08 ng/mL   Comment NOTIFIED PHYSICIAN    Comment 3            Comment: Due to the release kinetics of cTnI, a negative result within the first hours of the onset of symptoms does not rule out myocardial infarction with certainty. If myocardial infarction is still suspected, repeat the test at appropriate intervals.   I-Stat Chem 8, ED     Status: Abnormal   Collection Time: 07/23/15  1:47 PM  Result Value Ref Range   Sodium 139 135 - 145 mmol/L   Potassium 3.8 3.5 - 5.1 mmol/L   Chloride 102 101 - 111 mmol/L   BUN 18 6 - 20 mg/dL   Creatinine, Ser 0.90 0.44 - 1.00 mg/dL   Glucose, Bld 120 (H) 65 - 99 mg/dL   Calcium, Ion 1.16 1.12 - 1.23 mmol/L   TCO2 25 0 - 100 mmol/L   Hemoglobin 16.0 (H) 12.0 - 15.0 g/dL   HCT 47.0 (H) 36.0 - 46.0 %   Dg Chest Portable 1 View  07/23/2015  CLINICAL DATA:  Stroke.  Endotracheal tube placement EXAM: PORTABLE CHEST 1 VIEW COMPARISON:  10/22/2013 FINDINGS: Endotracheal tube tip is between the clavicular heads and carina. An orogastric tube reaches the stomach. Defibrillator pads over the left chest, obscuring the underlying lung. There is cardiopericardial enlargement and vascular pedicle widening which is accentuated by positioning and low volumes. No gross consolidation or edema.  No pneumothorax. IMPRESSION: 1. Unremarkable positioning of endotracheal and orogastric tubes. 2. Low volumes. Electronically Signed   By: Monte Fantasia M.D.   On: 07/23/2015 13:56    ROS: unable tyo ask pt intubated and on vent - no family currently  Blood pressure 114/77, pulse 117, temperature 98.3 F (36.8 C), temperature source Axillary, resp.  rate 16, last menstrual period 05/19/2015, SpO2 99 %. PE: General:intubated sedated Skin:Warm to cool and dry, brisk capillary refill HEENT:normocephalic, sclera clear, mucus membranes moist Neck:supple, no JVD Heart:S1S2 RRR without murmur, gallup, rub or click Lungs:clear, ant without rales, rhonchi, or wheezes [id number], non tender, + BS, do not palpate liver spleen or masses Ext:no lower ext edema, 2+ pedal pulses, 2+ radial pulses Neuro:sedated had been alert on arrival, MAE   Assessment/Plan Principal Problem:   ST elevation myocardial infarction (STEMI) of  inferior wall, initial episode of care [facility]) Active Problems:   H/O myocardial infarction less than 8 weeks- STEMI inf wall 07/20/15- stent to RCA   Hyperlipidemia LDL goal <70   CAD in native artery, RCA, LAD and LCX   VF (ventricular fibrillation) ([place]) on arrival 07/23/15  Successful defib in ER pt now with inf wall STEMI and ST elevation V3. To cath lab emergently intubated on vent.. Admit to ICU.   [place]a [place] Practitioner Certified [place] Pager [phone] or after 5pm or weekends call [phone] 07/23/2015, 2:27 PM

## 2015-07-23 NOTE — Code Documentation (Addendum)
Pt alert at this time, screaming and crying, MD explaining to patient plan of care for intubation. Cardiology at bedside.

## 2015-07-23 NOTE — Consult Note (Signed)
PULMONARY / CRITICAL CARE MEDICINE   Name: Sarah Medina MRN: [id number] DOB: 04-07-1966    ADMISSION DATE:  07/23/2015 CONSULTATION DATE:  07/23/15  REFERRING MD:  Dr. Gwenlyn Found   CHIEF COMPLAINT:  Cardiac Arrest   HISTORY OF PRESENT ILLNESS:   50 y/o F with PMH of HLD and recent admission to [facility] on 07/20/15 with prolonged chest pain.  EKG at that time demonstrated an acute inferior MI.  She underwent an emergent left heart cath which demonstrated an occluded proximal right coronary s/p PCI (xience alpine stent, drug eluting).  On 1/19, she was discharged home on plavix, ASA, lipitor, enalapril and beta blocker.  She also was to complete a prednisone taper for facial swelling (not angioedema, periorbital edema).    The patient presented to [facility] on 1/20 via EMS with worsening chest pain.  Initially, she was awake / alert.  However while sliding from EMS stretcher to ER stretcher she developed a VF arrest and shocked x1 to SR.  CPR initiated.  After the code, documentation notes the patient to be "screaming & crying".  She was taken emergently to cath lab for intervention.  During the cath, the patient had 4 episodes of VF requiring shock.    PCCM consulted for ventilator assistance.    PAST MEDICAL HISTORY :  She  has a past medical history of Hypercholesteremia; H/O myocardial infarction less than 8 weeks- STEMI inf wall 07/20/15- stent to RCA (07/20/15); CAD in native artery, RCA, LAD and LCX (07/23/2015); STEMI (ST elevation myocardial infarction) ([place]) (07/23/15); and Periorbital edema.  PAST SURGICAL HISTORY: She  has past surgical history that includes Cardiac catheterization (N/A, 07/20/2015) and Cardiac catheterization (N/A, 07/20/2015).  No Known Allergies  No current facility-administered medications on file prior to encounter.   Current Outpatient Prescriptions on File Prior to Encounter  Medication Sig  . aspirin EC 325 MG EC tablet Take 1 tablet (325 mg total) by mouth daily.   Marland Kitchen atorvastatin (LIPITOR) 40 MG tablet Take 1 tablet (40 mg total) by mouth daily at 6 PM.  . clopidogrel (PLAVIX) 75 MG tablet Take 1 tablet (75 mg total) by mouth daily with breakfast.  . diphenhydrAMINE (BENADRYL) 25 mg capsule Take 1 capsule (25 mg total) by mouth every 6 (six) hours as needed.  . enalapril (VASOTEC) 5 MG tablet Take 1 tablet (5 mg total) by mouth 2 (two) times daily.  . hydrOXYzine (VISTARIL) 25 MG capsule Take 1 capsule (25 mg total) by mouth 3 (three) times daily as needed for itching. Take 1-2 tabs by mouth 3 (three) times a day as needed for itching  . metoprolol tartrate (LOPRESSOR) 25 MG tablet Take 1 tablet (25 mg total) by mouth 2 (two) times daily.  . predniSONE (DELTASONE) 10 MG tablet Label  & dispense according to the schedule below. 4 Pills PO for 1 day, 3 Pills PO for 1 day, 2 Pills PO for 1 day, 1 Pill PO for 1 days then STOP.  Marland Kitchen ranitidine (ZANTAC) 150 MG tablet Take 1 tablet (150 mg total) by mouth 2 (two) times daily.  . [DISCONTINUED] cyproheptadine (PERIACTIN) 4 MG tablet Take 1 tablet (4 mg total) by mouth 3 (three) times daily as needed for allergies.    FAMILY HISTORY:  Her has no family status information on file.   SOCIAL HISTORY: She  reports that she has never smoked. She does not have any smokeless tobacco history on file. She reports that she does not drink alcohol.  REVIEW  OF SYSTEMS:   Unable to complete as patient is altered on mechanical ventilation.    SUBJECTIVE:    VITAL SIGNS: BP 114/77 mmHg  Pulse 117  Temp(Src) 98.3 F (36.8 C) (Axillary)  Resp 16  SpO2 99%  LMP 05/19/2015 (LMP Unknown)  HEMODYNAMICS:    VENTILATOR SETTINGS: Vent Mode:  [-] PRVC FiO2 (%):  [100 %] 100 % Set Rate:  [14 bmp] 14 bmp Vt Set:  [500 mL] 500 mL PEEP:  [5 cmH20] 5 cmH20 Plateau Pressure:  [19 cmH20] 19 cmH20  INTAKE / OUTPUT:    PHYSICAL EXAMINATION: General:  Obese, chronically ill appearing female, sedated and intubated. Neuro:   Sedated, withdraws all ext to pain. HEENT:  [place]/AT, PERRL, EOM-I and MMM. Cardiovascular:  RRR, Nl S1/S2, -M/R/G. Lungs:  CTA bilaterally. Abdomen:  Soft, NT, ND and +BS. Musculoskeletal:  -edema and -tenderness. Skin:  Intact.  LABS:  BMET  Recent Labs Lab 07/21/15 0514 07/22/15 0419 07/23/15 1347  NA 138 136 139  K 3.7 3.6 3.8  CL 104 103 102  CO2 27 29  --   BUN 10 12 18   CREATININE 0.68 0.86 0.90  GLUCOSE 104* 101* 120*    Electrolytes  Recent Labs Lab 07/21/15 0514 07/22/15 0419  CALCIUM 8.4* 8.4*    CBC  Recent Labs Lab 07/20/15 0759 07/21/15 0514 07/22/15 0419 07/23/15 1347  WBC 10.4 8.7 9.5  --   HGB 13.8 11.8* 11.9* 16.0*  HCT 41.8 36.3 36.3 47.0*  PLT 348 305 311  --     Coag's  Recent Labs Lab 07/20/15 0759  APTT 31  INR 1.01    Sepsis Markers No results for input(s): LATICACIDVEN, PROCALCITON, O2SATVEN in the last 168 hours.  ABG No results for input(s): PHART, PCO2ART, PO2ART in the last 168 hours.  Liver Enzymes No results for input(s): AST, ALT, ALKPHOS, BILITOT, ALBUMIN in the last 168 hours.  Cardiac Enzymes  Recent Labs Lab 07/20/15 1726 07/20/15 2320 07/21/15 0514  TROPONINI 12.98* 10.39* 8.11*    Glucose  Recent Labs Lab 07/20/15 1002  GLUCAP 92    Imaging Dg Chest Portable 1 View  07/23/2015  CLINICAL DATA:  Stroke.  Endotracheal tube placement EXAM: PORTABLE CHEST 1 VIEW COMPARISON:  10/22/2013 FINDINGS: Endotracheal tube tip is between the clavicular heads and carina. An orogastric tube reaches the stomach. Defibrillator pads over the left chest, obscuring the underlying lung. There is cardiopericardial enlargement and vascular pedicle widening which is accentuated by positioning and low volumes. No gross consolidation or edema.  No pneumothorax. IMPRESSION: 1. Unremarkable positioning of endotracheal and orogastric tubes. 2. Low volumes. Electronically Signed   By: Monte Fantasia M.D.   On: 07/23/2015 13:56      STUDIES:  1/20 LHC >>   CULTURES:   ANTIBIOTICS:   SIGNIFICANT EVENTS: 1/17 - 1/19  Admit to [facility] with chest pain, s/p PCI to RCA with drug eluting stent 1/20  Admit to [facility] with chest pain, arrest >> to [facility]   LINES/TUBES: ETT 1/20 >>  Fem Sheath 1/20 >>   DISCUSSION: 50 y/o F with recent admit for LHC at [facility] s/p PCI of RCA.  Readmitted 1/20 with recurrent chest pain, cardiac arrest to cath lab.    ASSESSMENT / PLAN:  PULMONARY A: Acute Respiratory Failure / Insufficiency - in setting of cardiac arrest  P:   MV support, 8cc/kg Wean PEEP / FiO2 for sats > 92% Intermittent CXR  PRN BD's   CARDIOVASCULAR A:  Cardiac  Arrest - RCA, LAD disease.  Recent admit for CP 1/17 s/p stent to RCA HLD P:  ICU monitoring of hemodynamics  Assess ECHO  Trend EKG  Continue Amio, ASA, Lipitor, plavix, vasotec, lopressor, effient PRN NTG Not cooling candidate >> pt woke after code and less than 5 minutes duration   RENAL A:   At Risk AKI - in setting of cardiac arrest, hypotension  P:   Trend BMP / UOP  Replace electrolytes as indicated   GASTROINTESTINAL A:   At Risk Malnutrition - ICU admit / critical illness  P:   NPO Insert OGT  Consider TF in am  PPI while on vent   HEMATOLOGIC A:   Anemia  P:  Trend CBC  Monitor for bleeding post cath   INFECTIOUS A:   No acute infectious process  P:   Monitor WBC / fever curve  No indication for abx at this time   ENDOCRINE A:   Mild Hyperglycemia - suspect stress response  P:   Monitor glucose on BMP  NEUROLOGIC A:   Acute Encephalopathy - in setting of cardiac arrest  P:   RASS goal: n/a Serial neuro exams  Propofol for sedation  Fentanyl for pain    FAMILY  - Updates: no family available   - Inter-disciplinary family meet or Palliative Care meeting due by:  1/27    Noe Gens, NP-C [facility] Pgr: [phone] or if no answer [phone] 07/23/2015, 3:35 PM  Attending  Note:  50 year old female with PMH of CAD who was just discharged from [facility] after placement of a proximal RCA stent. Patient was home one day when she called EMS, while being moved from EMS stretcher to ER stretcher suffered a VF cardiac arrest. Patient was coded x1 minute, ROSC after shock. On exam, patient is on the cath table, pupils pin point and lungs are CTA. Patient was awake and interactive post first code so not a cooling candidate. Patient was intubated and taken to the cath lab. PCCM was called for vent management and medical management. Will enter vent orders. Currently has a sheath in for central IV access and on anti-coag and anti-plat so will not place a central line. Anticipate if hemodynamically stable by AM and able to remove sheaths that we will be able to extubate easily.  The patient is critically ill with multiple organ systems failure and requires high complexity decision making for assessment and support, frequent evaluation and titration of therapies, application of advanced monitoring technologies and extensive interpretation of multiple databases.   Critical Care Time devoted to patient care services described in this note is 35 Minutes. This time reflects time of care of this signee Dr Jennet Maduro. This critical care time does not reflect procedure time, or teaching time or supervisory time of PA/NP/Med student/Med Resident etc but could involve care discussion time.  Rush Farmer, M.D. [facility] Pulmonary/Critical Care Medicine. Pager: [phone]. After hours pager: [phone].

## 2015-07-23 NOTE — Code Documentation (Signed)
Pt waking up at this time, attempting to pull ET tube out. Pt has pulled RW IV out at this time. MD Tomi Bamberger made aware and ordered to give 50 mcg of fentanyl.

## 2015-07-23 NOTE — Progress Notes (Signed)
RT assisted with pt transport on vent from cath lab to [place]. Vitals remained stable.

## 2015-07-23 NOTE — ED Provider Notes (Signed)
.  Intubation Date/Time: 07/23/2015 1:59 PM Performed by: Heriberto Antigua Authorized by: Dorie Rank Consent: The procedure was performed in an emergent situation. Verbal consent not obtained. Written consent not obtained. Patient identity confirmed: provided demographic data Indications: airway protection Intubation method: direct Patient status: paralyzed (RSI) Preoxygenation: nonrebreather mask and BVM Sedatives: etomidate Paralytic: succinylcholine Laryngoscope size: Mac 3 Tube size: 7.5 mm Tube type: cuffed Number of attempts: 1 Cords visualized: yes Post-procedure assessment: chest rise,  ETCO2 monitor and CO2 detector Breath sounds: equal and absent over the epigastrium Cuff inflated: yes ETT to lip: 22 cm Tube secured with: ETT holder Chest x-ray interpreted by me and other physician. Chest x-ray findings: endotracheal tube in appropriate position Patient tolerance: Patient tolerated the procedure well with no immediate complications    Heriberto Antigua, MD 07/23/15 1402

## 2015-07-23 NOTE — ED Provider Notes (Signed)
CSN: [id number]     Arrival date & time 07/23/15  1327 History   First MD Initiated Contact with Patient 07/23/15 1345     Chief Complaint  Patient presents with  . Chest Pain    STEMI?   level V caveat: Altered mental status, cardiac arrest HPI The patient presented to the emergency room for evaluation of chest pain. She was admitted to the hospital just this past week at [facility].  The patient had a cardiac catheterization on January 17, 3 days ago with stent placement for an ST elevation MI.  Patient went home from the hospital yesterday. This morning she started developing chest pain.  EMS noted ST elevation and activated a code STEMI. [facility] [place] because of capacity issues. Patient arrived to the emergency room alert and awake sitting up in the gurney. As she was being transferred to the ED bed the patient immediately became unresponsive. She started jerking and shaking in her upper extremities. The patient remained unresponsive. Monitor suggested V fib,. Past Medical History  Diagnosis Date  . Hypercholesteremia    Past Surgical History  Procedure Laterality Date  . Cardiac catheterization N/A 07/20/2015    Procedure: Left Heart Cath and Coronary Angiography;  Surgeon: Isaias Cowman, MD;  Location: [facility];  Service: Cardiovascular;  Laterality: N/A;  . Cardiac catheterization N/A 07/20/2015    Procedure: Coronary Stent Intervention;  Surgeon: Isaias Cowman, MD;  Location: [facility];  Service: Cardiovascular;  Laterality: N/A;   No family history on file. Social History  Substance Use Topics  . Smoking status: Never Smoker   . Smokeless tobacco: Not on file  . Alcohol Use: No   OB History    No data available     Review of Systems  Unable to perform ROS: Mental status change      Allergies  Review of patient's allergies indicates no known allergies.  Home Medications   Prior to  Admission medications   Medication Sig Start Date End Date Taking? Authorizing Provider  aspirin EC 325 MG EC tablet Take 1 tablet (325 mg total) by mouth daily. 07/22/15   Isaias Cowman, MD  atorvastatin (LIPITOR) 40 MG tablet Take 1 tablet (40 mg total) by mouth daily at 6 PM. 07/22/15   Isaias Cowman, MD  clopidogrel (PLAVIX) 75 MG tablet Take 1 tablet (75 mg total) by mouth daily with breakfast. 07/22/15   Isaias Cowman, MD  diphenhydrAMINE (BENADRYL) 25 mg capsule Take 1 capsule (25 mg total) by mouth every 6 (six) hours as needed. 07/22/15   Henreitta Leber, MD  enalapril (VASOTEC) 5 MG tablet Take 1 tablet (5 mg total) by mouth 2 (two) times daily. 07/22/15   Isaias Cowman, MD  hydrOXYzine (VISTARIL) 25 MG capsule Take 1 capsule (25 mg total) by mouth 3 (three) times daily as needed for itching. Take 1-2 tabs by mouth 3 (three) times a day as needed for itching 06/30/15   Jenise V Bacon Menshew, PA-C  metoprolol tartrate (LOPRESSOR) 25 MG tablet Take 1 tablet (25 mg total) by mouth 2 (two) times daily. 07/22/15   Isaias Cowman, MD  predniSONE (DELTASONE) 10 MG tablet Label  & dispense according to the schedule below. 4 Pills PO for 1 day, 3 Pills PO for 1 day, 2 Pills PO for 1 day, 1 Pill PO for 1 days then STOP. 07/22/15   Henreitta Leber, MD  ranitidine (ZANTAC) 150 MG tablet  Take 1 tablet (150 mg total) by mouth 2 (two) times daily. 06/24/15   Jenise V Bacon Menshew, PA-C   BP 132/84 mmHg  Pulse 88  Resp 19  SpO2 100%  LMP 05/19/2015 (LMP Unknown) Physical Exam  Constitutional: No distress.  Obese  HENT:  Head: Normocephalic and atraumatic.  Right Ear: External ear normal.  Left Ear: External ear normal.  Eyes: Conjunctivae are normal. Right eye exhibits no discharge. Left eye exhibits no discharge. No scleral icterus.  Neck: Neck supple. No tracheal deviation present.  Cardiovascular: Normal rate, regular rhythm and intact distal pulses.   Pulmonary/Chest:  Effort normal and breath sounds normal. No stridor. No respiratory distress. She has no wheezes. She has no rales.  Abdominal: Soft. Bowel sounds are normal. She exhibits no distension. There is no tenderness. There is no rebound and no guarding.  Musculoskeletal: She exhibits no edema or tenderness.  Neurological: Cranial nerve deficit: no facial droop,  She displays seizure activity. GCS eye subscore is 1. GCS verbal subscore is 1. GCS motor subscore is 1.  Skin: Skin is warm and dry. No rash noted.  Psychiatric: She has a normal mood and affect.  Nursing note and vitals reviewed.   ED Course  .Critical Care Performed by: Dorie Rank Authorized by: Dorie Rank Total critical care time: 30 minutes Critical care was necessary to treat or prevent imminent or life-threatening deterioration of the following conditions: cardiac failure. Critical care was time spent personally by me on the following activities: development of treatment plan with patient or surrogate, discussions with consultants, evaluation of patient's response to treatment, examination of patient, ordering and performing treatments and interventions, ordering and review of laboratory studies, ordering and review of radiographic studies, pulse oximetry and re-evaluation of patient's condition.   (including critical care time) Labs Review Labs Reviewed  I-STAT CHEM 8, ED - Abnormal; Notable for the following:    Glucose, Bld 120 (*)    Hemoglobin 16.0 (*)    HCT 47.0 (*)    All other components within normal limits  I-STAT TROPOININ, ED - Abnormal; Notable for the following:    Troponin i, poc 1.12 (*)    All other components within normal limits  APTT  CBC  COMPREHENSIVE METABOLIC PANEL  PROTIME-INR  TRIGLYCERIDES  MAGNESIUM  TSH  T4, FREE  TROPONIN I  TROPONIN I  TROPONIN I  HEMOGLOBIN A1C  I-STAT TROPOININ, ED    Imaging Review Dg Chest Portable 1 View  07/23/2015  CLINICAL DATA:  Stroke.  Endotracheal tube  placement EXAM: PORTABLE CHEST 1 VIEW COMPARISON:  10/22/2013 FINDINGS: Endotracheal tube tip is between the clavicular heads and carina. An orogastric tube reaches the stomach. Defibrillator pads over the left chest, obscuring the underlying lung. There is cardiopericardial enlargement and vascular pedicle widening which is accentuated by positioning and low volumes. No gross consolidation or edema.  No pneumothorax. IMPRESSION: 1. Unremarkable positioning of endotracheal and orogastric tubes. 2. Low volumes. Electronically Signed   By: Monte Fantasia M.D.   On: 07/23/2015 13:56   I have personally reviewed and evaluated these images and lab results as part of my medical decision-making.   EKG Interpretation   Date/Time:  Friday July 23 2015 13:32:52 EST Ventricular Rate:  77 PR Interval:  139 QRS Duration: 105 QT Interval:  499 QTC Calculation: 565 R Axis:   71 Text Interpretation:  Sinus arrhythmia Repol abnrm suggests ischemia,  anterolateral Borderline ST elevation, anterior leads Prolonged QT  interval Baseline wander  in lead(s) I III aVL aVF V2 V3 V4 ** ** ACUTE MI  / STEMI ** ** Confirmed by Chayson Charters  MD-J, Jennefer Kopp [id number]) on 07/23/2015 2:45:07  PM      MDM   Final diagnoses:  Ventricular fibrillation (HCC)  ST elevation myocardial infarction (STEMI), unspecified artery [facility])    The patient went into cardiac arrest shortly after arrival in the emergency department. Patient was initially not even on the monitors. She appeared to have some seizure-like activity this lasted maybe 10-15 seconds. During this time the patient was placed on the monitors. Initial rhythm suggested ventricular fibrillation. CPR was administered. Patient was defibrillated once with a return back into a sinus rhythm. Patient did have a palpable pulse.  The total duration was maybe 1 minute.  Pt started to become more alert and responsive but she was confused.  She started to cry and was not following commands  completely.  Pt was then sedated and intubated by Dr Lanetta Inch under my direct supervision in anticipation of going to the cath lab.  Repeat ECG was performed showing increasing st elevation.    Pt was started on propofol and fentanyl.  Transferred to the cath lab.  Admit for further treatment.   Discussed with Dr Pia Mau.  No need for cooling.  Will consult on patient regarding vent         Dorie Rank, MD 07/23/15 1447

## 2015-07-23 NOTE — Code Documentation (Signed)
Pt has not responded to IV fentanyl 50 mcg at this time, MD Tomi Bamberger ordering 150 mcg verbally.

## 2015-07-23 NOTE — Progress Notes (Signed)
Bleeding around central line and sheath site notified Dr. Jimmy Footman, order for Thrombi Pad to sites. Will apply thrombi pads and continue to monitor closely.

## 2015-07-23 NOTE — ED Notes (Signed)
Please call sister Ova Freshwater at [phone] with update on pt.

## 2015-07-23 NOTE — Procedures (Signed)
Central Venous Catheter Insertion Procedure Note Sarah Medina [id number] [birth year]  Procedure: Insertion of Central Venous Catheter Indications: Drug and/or fluid administration  Procedure Details Consent: Unable to obtain consent because of emergent medical necessity. Time Out: Verified patient identification, verified procedure, site/side was marked, verified correct patient position, special equipment/implants available, medications/allergies/relevent history reviewed, required imaging and test results available.  Performed  Maximum sterile technique was used including antiseptics, cap, gloves, gown, hand hygiene, mask and sheet. Skin prep: Chlorhexidine; local anesthetic administered A antimicrobial bonded/coated triple lumen catheter was placed in the right femoral vein due to emergent situation using the Seldinger technique.  Evaluation Blood flow good Complications: No apparent complications Patient did tolerate procedure well. Chest X-ray ordered to verify placement.  CXR: None.  Sarah Medina 07/23/2015, 5:20 PM

## 2015-07-24 ENCOUNTER — Inpatient Hospital Stay (HOSPITAL_COMMUNITY): Payer: Medicaid Other

## 2015-07-24 DIAGNOSIS — E785 Hyperlipidemia, unspecified: Secondary | ICD-10-CM

## 2015-07-24 DIAGNOSIS — D62 Acute posthemorrhagic anemia: Secondary | ICD-10-CM | POA: Insufficient documentation

## 2015-07-24 DIAGNOSIS — I252 Old myocardial infarction: Secondary | ICD-10-CM

## 2015-07-24 LAB — CBC
HEMATOCRIT: 28.5 % — AB (ref 36.0–46.0)
Hemoglobin: 9.2 g/dL — ABNORMAL LOW (ref 12.0–15.0)
MCH: 26.6 pg (ref 26.0–34.0)
MCHC: 32.3 g/dL (ref 30.0–36.0)
MCV: 82.4 fL (ref 78.0–100.0)
Platelets: 281 10*3/uL (ref 150–400)
RBC: 3.46 MIL/uL — ABNORMAL LOW (ref 3.87–5.11)
RDW: 15.3 % (ref 11.5–15.5)
WBC: 9.9 10*3/uL (ref 4.0–10.5)

## 2015-07-24 LAB — BLOOD GAS, ARTERIAL
ACID-BASE DEFICIT: 2 mmol/L (ref 0.0–2.0)
Bicarbonate: 21.9 mEq/L (ref 20.0–24.0)
DRAWN BY: 252031
FIO2: 0.4
MECHVT: 500 mL
O2 Saturation: 99 %
PEEP: 5 cmH2O
PO2 ART: 167 mmHg — AB (ref 80.0–100.0)
Patient temperature: 98.6
RATE: 14 resp/min
TCO2: 23 mmol/L (ref 0–100)
pCO2 arterial: 34.9 mmHg — ABNORMAL LOW (ref 35.0–45.0)
pH, Arterial: 7.414 (ref 7.350–7.450)

## 2015-07-24 LAB — BASIC METABOLIC PANEL
Anion gap: 8 (ref 5–15)
BUN: 8 mg/dL (ref 6–20)
CALCIUM: 7.9 mg/dL — AB (ref 8.9–10.3)
CO2: 23 mmol/L (ref 22–32)
Chloride: 108 mmol/L (ref 101–111)
Creatinine, Ser: 0.6 mg/dL (ref 0.44–1.00)
GFR calc Af Amer: >60 mL/min (ref >60)
GLUCOSE: 98 mg/dL (ref 65–99)
POTASSIUM: 3.5 mmol/L (ref 3.5–5.1)
SODIUM: 139 mmol/L (ref 135–145)

## 2015-07-24 LAB — LIPID PANEL
CHOLESTEROL: 154 mg/dL (ref 0–200)
HDL: 30 mg/dL — AB (ref >40)
LDL CALC: 94 mg/dL (ref 0–99)
TRIGLYCERIDES: 148 mg/dL (ref <150)
Total CHOL/HDL Ratio: 5.1 RATIO
VLDL: 30 mg/dL (ref 0–40)

## 2015-07-24 LAB — TROPONIN I
TROPONIN I: 22.34 ng/mL — AB (ref <0.031)
TROPONIN I: 19.59 ng/mL — AB (ref <0.031)

## 2015-07-24 LAB — GLUCOSE, CAPILLARY: Glucose-Capillary: 76 mg/dL (ref 65–99)

## 2015-07-24 LAB — MAGNESIUM: MAGNESIUM: 1.6 mg/dL — AB (ref 1.7–2.4)

## 2015-07-24 LAB — PHOSPHORUS: PHOSPHORUS: 3.1 mg/dL (ref 2.5–4.6)

## 2015-07-24 LAB — POCT ACTIVATED CLOTTING TIME: Activated Clotting Time: 152 seconds

## 2015-07-24 MED ORDER — POTASSIUM CHLORIDE 10 MEQ/50ML IV SOLN
10.0000 meq | INTRAVENOUS | Status: AC
  Administered 2015-07-24 (×2): 10 meq via INTRAVENOUS
  Filled 2015-07-24 (×2): qty 50

## 2015-07-24 MED ORDER — CETYLPYRIDINIUM CHLORIDE 0.05 % MT LIQD: 7.0000 mL | Freq: Two times a day (BID) | OROMUCOSAL | Status: DC

## 2015-07-24 MED ORDER — MAGNESIUM SULFATE 2 GM/50ML IV SOLN
2.0000 g | Freq: Once | INTRAVENOUS | Status: AC
  Administered 2015-07-24: 2 g via INTRAVENOUS
  Filled 2015-07-24: qty 50

## 2015-07-24 MED ORDER — PERFLUTREN LIPID MICROSPHERE
INTRAVENOUS | Status: AC
  Administered 2015-07-24: 2 mL
  Filled 2015-07-24: qty 10

## 2015-07-24 MED ORDER — CETYLPYRIDINIUM CHLORIDE 0.05 % MT LIQD
7.0000 mL | Freq: Two times a day (BID) | OROMUCOSAL | Status: DC
  Administered 2015-07-25 – 2015-07-27 (×2): 7 mL via OROMUCOSAL

## 2015-07-24 MED ORDER — PERFLUTREN LIPID MICROSPHERE
1.0000 mL | INTRAVENOUS | Status: AC | PRN
  Filled 2015-07-24: qty 10

## 2015-07-24 NOTE — Progress Notes (Signed)
PULMONARY / CRITICAL CARE MEDICINE   Name: Sarah Medina MRN: [id number] DOB: Mar 12, 1966    ADMISSION DATE:  07/23/2015 CONSULTATION DATE:  07/23/15  REFERRING MD:  Dr. Gwenlyn Found   CHIEF COMPLAINT:  Cardiac Arrest   HISTORY OF PRESENT ILLNESS:   50 y/o F with PMH of HLD and recent admission to [facility] on 07/20/15 with prolonged chest pain.  EKG at that time demonstrated an acute inferior MI.  She underwent an emergent left heart cath which demonstrated an occluded proximal right coronary s/p PCI (xience alpine stent, drug eluting).  On 1/19, she was discharged home on plavix, ASA, lipitor, enalapril and beta blocker.  She also was to complete a prednisone taper for facial swelling (not angioedema, periorbital edema).    The patient presented to [facility] on 1/20 via EMS with worsening chest pain.  Initially, she was awake / alert.  However while sliding from EMS stretcher to ER stretcher she developed a VF arrest and shocked x1 to SR.  CPR initiated.  After the code, documentation notes the patient to be "screaming & crying".  She was taken emergently to cath lab for intervention.  During the cath, the patient had 4 episodes of VF requiring shock.      SUBJECTIVE:  Looks great, passed SBT, ready for extubation   VITAL SIGNS: BP 127/65 mmHg  Pulse 89  Temp(Src) 99.2 F (37.3 C) (Oral)  Resp 23  Ht 5\' 6"  (1.676 m)  Wt 293 lb 3.4 oz (133 kg)  BMI 47.35 kg/m2  SpO2 100%  LMP 05/19/2015 (LMP Unknown)  HEMODYNAMICS:    VENTILATOR SETTINGS: Vent Mode:  [-] CPAP FiO2 (%):  [40 %-100 %] 40 % Set Rate:  [14 bmp] 14 bmp Vt Set:  [500 mL] 500 mL PEEP:  [5 cmH20] 5 cmH20 Plateau Pressure:  [9 cmH20-27 cmH20] 16 cmH20  INTAKE / OUTPUT: I/O last 3 completed shifts: In: 2266.3 [I.V.:2266.3] Out: 1735 [Urine:1435; Emesis/NG output:300]  PHYSICAL EXAMINATION: General:  Obese, chronically ill appearing female, awake, follows commands. Currently looks comfortable on PSV Neuro:  Awake, calm,  moves all ext. No focal def  HEENT:  [place]/AT, PERRL, EOM-I and MMM. Cardiovascular:  RRR, Nl S1/S2, -M/R/G. Lungs:  CTA bilaterally. No accessory muscle use on PSV Abdomen:  Soft, NT, ND and +BS. Musculoskeletal:  -edema and -tenderness. Skin:  Intact.  LABS:  BMET  Recent Labs Lab 07/22/15 0419 07/23/15 1347 07/23/15 2128 07/24/15 0313  NA 136 139 139 139  K 3.6 3.8 3.7 3.5  CL 103 102 105 108  CO2 29  --  25 23  BUN 12 18 11 8   CREATININE 0.86 0.90 0.80 0.60  GLUCOSE 101* 120* 105* 98    Electrolytes  Recent Labs Lab 07/22/15 0419 07/23/15 2128 07/24/15 0313  CALCIUM 8.4* 8.2* 7.9*  MG  --  1.8 1.6*  PHOS  --   --  3.1    CBC  Recent Labs Lab 07/22/15 0419 07/23/15 1347 07/23/15 2128 07/24/15 0313  WBC 9.5  --  9.9 9.9  HGB 11.9* 16.0* 10.0* 9.2*  HCT 36.3 47.0* 30.5* 28.5*  PLT 311  --  316 281    Coag's  Recent Labs Lab 07/20/15 0759 07/23/15 2128  APTT 31  --   INR 1.01 1.63*    Sepsis Markers No results for input(s): LATICACIDVEN, PROCALCITON, O2SATVEN in the last 168 hours.  ABG  Recent Labs Lab 07/23/15 1751 07/24/15 0315  PHART 7.413 7.414  PCO2ART 42.3 34.9*  PO2ART 387.0* 167*    Liver Enzymes  Recent Labs Lab 07/23/15 2128  AST 129*  ALT 70*  ALKPHOS 64  BILITOT 0.5  ALBUMIN 2.4*    Cardiac Enzymes  Recent Labs Lab 07/21/15 0514 07/23/15 2128 07/24/15 0313  TROPONINI 8.11* 11.87* 22.34*    Glucose  Recent Labs Lab 07/20/15 1002 07/24/15 0741  GLUCAP 92 76    Imaging Dg Chest Port 1 View  07/24/2015  CLINICAL DATA:  Status post intubation. EXAM: PORTABLE CHEST 1 VIEW COMPARISON:  07/23/2015 FINDINGS: Endotracheal tube terminates approximately 2.5 cm above the carina. Enteric catheter has been advanced, tip collimated off the image at the level of the gastric antrum. Cardiomediastinal silhouette is normal. Mediastinal contours appear intact. There is no evidence of focal airspace consolidation,  pleural effusion or pneumothorax. Lung volumes are low. Osseous structures are without acute abnormality. Soft tissues are grossly normal. IMPRESSION: Stable position of the endotracheal tube. Interval advancement of the enteric catheter, tip collimated off the image at the level of the gastric antrum. Low lung volumes. Electronically Signed   By: Fidela Salisbury M.D.   On: 07/24/2015 09:23   Dg Chest Portable 1 View  07/23/2015  CLINICAL DATA:  Stroke.  Endotracheal tube placement EXAM: PORTABLE CHEST 1 VIEW COMPARISON:  10/22/2013 FINDINGS: Endotracheal tube tip is between the clavicular heads and carina. An orogastric tube reaches the stomach. Defibrillator pads over the left chest, obscuring the underlying lung. There is cardiopericardial enlargement and vascular pedicle widening which is accentuated by positioning and low volumes. No gross consolidation or edema.  No pneumothorax. IMPRESSION: 1. Unremarkable positioning of endotracheal and orogastric tubes. 2. Low volumes. Electronically Signed   By: Monte Fantasia M.D.   On: 07/23/2015 13:56  PCXR on clear infiltrates.    STUDIES:  1/20 LHC >> occluded stent.   CULTURES:   ANTIBIOTICS:   SIGNIFICANT EVENTS: 1/17 - 1/19  Admit to [facility] with chest pain, s/p PCI to RCA with drug eluting stent 1/20  Admit to [facility] with chest pain, arrest >> to [facility]  1/20: left heart cath showed stent thrombosis. Had balloon angioplasty. Changed to effient s/p cath  LINES/TUBES: ETT 1/20 >>  Fem Sheath 1/20 >>   DISCUSSION: 50 y/o F with recent admit for LHC at [facility] s/p PCI of RCA.  Readmitted 1/20 with recurrent chest pain, cardiac arrest to cath lab.  Found to have stenosed stent. Underwent balloon angioplasty and started on effient. Hemodynamically stable over night. Passed SBT. Will extubate.   ASSESSMENT / PLAN:  PULMONARY A: Acute Respiratory Failure / Insufficiency - in setting of cardiac arrest   >passed SBT. Fully awake. Ready for  extubation  P:   Extubate to n/c and wean FIO2  CARDIOVASCULAR A:  Cardiac Arrest - RCA, LAD disease.  Recent admit for CP 1/17 s/p stent to RCA HLD P:  ICU monitoring of hemodynamics  f/u ECHO  Trend EKG  Continue Amio, ASA, Lipitor, vasotec, lopressor, effient PRN NTG  RENAL A:   At Risk AKI - in setting of cardiac arrest, hypotension  P:   Trend BMP / UOP  Replace electrolytes as indicated   GASTROINTESTINAL A:   At Risk Malnutrition - ICU admit / critical illness  Shock liver  P:   NPO-->adv as tol  PPI while on vent  Repeat LFTs in am   HEMATOLOGIC A:   Anemia  P:  Trend CBC  Monitor for bleeding post cath   INFECTIOUS A:   No  acute infectious process  P:   Monitor WBC / fever curve  No indication for abx at this time   ENDOCRINE A:   Mild Hyperglycemia - suspect stress response  P:   Monitor glucose on BMP  NEUROLOGIC A:   Acute Encephalopathy - in setting of cardiac arrest -->resovled  P:   Supportive care    FAMILY  - Updates: no family available   - Inter-disciplinary family meet or Palliative Care meeting due by:  1/27  Erick Colace ACNP-BC [place] Pager # [phone] OR # [phone] if no answer

## 2015-07-24 NOTE — Progress Notes (Addendum)
  Echocardiogram 2D Echocardiogram with Definity has been performed.  Jennette Dubin 07/24/2015, 9:03 AM

## 2015-07-24 NOTE — Progress Notes (Signed)
SUBJECTIVE:  Short run of nonsustained ventricular tachycardia noted on telemetry last night. Rhythm otherwise has been stable. Right groin sheath needs to come out today. She did have some bleeding around the sheath yesterday.  OBJECTIVE:   Vitals:   Filed Vitals:   07/24/15 0900 07/24/15 1000 07/24/15 1023 07/24/15 1100  BP: 97/45 101/36 103/36 127/38  Pulse:   92   Temp:      TempSrc:      Resp: 11 19 25 17   Height:      Weight:      SpO2: 100% 100% 100% 100%   I&O's:   Intake/Output Summary (Last 24 hours) at 07/24/15 1128 Last data filed at 07/24/15 1100  Gross per 24 hour  Intake 2618.06 ml  Output   1735 ml  Net 883.06 ml   TELEMETRY: Reviewed telemetry pt in normal sinus rhythm with short bursts of nonsustained ventricular tachycardia:     PHYSICAL EXAM General: Well developed, well nourished, in no acute distress Head:   Normal cephalic and atramatic  Lungs:   Clear bilaterally to auscultation. Heart:   HRRR S1 S2  No JVD.   Abdomen: abdomen soft and non-tender Msk:  Back normal,  Normal strength and tone for age. Extremities:   No edema.   Neuro: Alert and oriented. Psych:  Normal affect, responds appropriately Skin: No rash   LABS: Basic Metabolic Panel:  Recent Labs  07/23/15 2128 07/24/15 0313  NA 139 139  K 3.7 3.5  CL 105 108  CO2 25 23  GLUCOSE 105* 98  BUN 11 8  CREATININE 0.80 0.60  CALCIUM 8.2* 7.9*  MG 1.8 1.6*  PHOS  --  3.1   Liver Function Tests:  Recent Labs  07/23/15 2128  AST 129*  ALT 70*  ALKPHOS 64  BILITOT 0.5  PROT 5.6*  ALBUMIN 2.4*   No results for input(s): LIPASE, AMYLASE in the last 72 hours. CBC:  Recent Labs  07/23/15 2128 07/24/15 0313  WBC 9.9 9.9  HGB 10.0* 9.2*  HCT 30.5* 28.5*  MCV 81.8 82.4  PLT 316 281   Cardiac Enzymes:  Recent Labs  07/23/15 2128 07/24/15 0313  TROPONINI 11.87* 22.34*   BNP: Invalid input(s): POCBNP D-Dimer: No results for input(s): DDIMER in the last 72  hours. Hemoglobin A1C: No results for input(s): HGBA1C in the last 72 hours. Fasting Lipid Panel:  Recent Labs  07/24/15 0313  CHOL 154  HDL 30*  LDLCALC 94  TRIG 148  CHOLHDL 5.1   Thyroid Function Tests:  Recent Labs  07/23/15 2128  TSH 1.760   Anemia Panel: No results for input(s): VITAMINB12, FOLATE, FERRITIN, TIBC, IRON, RETICCTPCT in the last 72 hours. Coag Panel:   Lab Results  Component Value Date   INR 1.63* 07/23/2015   INR 1.01 07/20/2015    RADIOLOGY: Dg Chest Port 1 View  07/24/2015  CLINICAL DATA:  Status post intubation. EXAM: PORTABLE CHEST 1 VIEW COMPARISON:  07/23/2015 FINDINGS: Endotracheal tube terminates approximately 2.5 cm above the carina. Enteric catheter has been advanced, tip collimated off the image at the level of the gastric antrum. Cardiomediastinal silhouette is normal. Mediastinal contours appear intact. There is no evidence of focal airspace consolidation, pleural effusion or pneumothorax. Lung volumes are low. Osseous structures are without acute abnormality. Soft tissues are grossly normal. IMPRESSION: Stable position of the endotracheal tube. Interval advancement of the enteric catheter, tip collimated off the image at the level of the gastric antrum. Low lung  volumes. Electronically Signed   By: Fidela Salisbury M.D.   On: 07/24/2015 09:23   Dg Chest Portable 1 View  07/23/2015  CLINICAL DATA:  Stroke.  Endotracheal tube placement EXAM: PORTABLE CHEST 1 VIEW COMPARISON:  10/22/2013 FINDINGS: Endotracheal tube tip is between the clavicular heads and carina. An orogastric tube reaches the stomach. Defibrillator pads over the left chest, obscuring the underlying lung. There is cardiopericardial enlargement and vascular pedicle widening which is accentuated by positioning and low volumes. No gross consolidation or edema.  No pneumothorax. IMPRESSION: 1. Unremarkable positioning of endotracheal and orogastric tubes. 2. Low volumes. Electronically  Signed   By: Monte Fantasia M.D.   On: 07/23/2015 13:56   Dg Hand Complete Right  06/30/2015  CLINICAL DATA:  Swelling in the right hand.  No reported injury. EXAM: RIGHT HAND - COMPLETE 3+ VIEW COMPARISON:  None. FINDINGS: No fracture, dislocation or suspicious focal osseous lesion. Mild osteoarthritis at distal interphalangeal joint of the right third finger. Minimal osteoarthritis at the third metacarpophalangeal joint. No joint erosions. IMPRESSION: Mild polyarticular osteoarthritis in the right hand as described. Electronically Signed   By: Ilona Sorrel M.D.   On: 06/30/2015 21:15      ASSESSMENT: Acute inferior wall MI/stent thrombosis   PLAN:  Treated with balloon angioplasty of recently placed stent. Rhythm seems to be stable. She is on the ventilator. Appreciate critical care's assistance with ventilator management. Hopefully, she can be extubated soon.  Recent ventricular fibrillation at the time of yesterday's presentation. Currently on IV amiodarone. If her rhythm remained stable over the next 24 hours, could consider discontinuing the intravenous amiodarone.  Anemia: This will need to be followed. No obvious right groin hematoma. She does have palpable pulse in her right foot.  Hyperlipidemia: Continue lipid-lowering therapy. LDL 94 which is above target.    Jettie Booze, MD  07/24/2015  11:28 AM

## 2015-07-24 NOTE — Progress Notes (Signed)
[facility] ADULT ICU REPLACEMENT PROTOCOL FOR AM LAB REPLACEMENT ONLY  The patient does apply for the [facility] Adult ICU Electrolyte Replacment Protocol based on the criteria listed below:   1. Is GFR >/= 40 ml/min? Yes.    Patient's GFR today is >60 2. Is urine output >/= 0.5 ml/kg/hr for the last 6 hours? Yes.   Patient's UOP is 0.6 ml/kg/hr 3. Is BUN < 60 mg/dL? Yes.    Patient's BUN today is 23 4. Abnormal electrolyte(s):K3.5 5. Ordered repletion with: per protocol 6. If a panic level lab has been reported, has the CCM MD in charge been notified? Yes.  .   Physician:  Patricia Nettle, MD  Vear Clock 07/24/2015 5:29 AM

## 2015-07-24 NOTE — Procedures (Signed)
Extubation Procedure Note  Patient Details:   Name: Maddyx Beames DOB: [birth year] MRN: [id number]   Airway Documentation:     Evaluation  O2 sats: stable throughout Complications: No apparent complications Patient did tolerate procedure well. Bilateral Breath Sounds: Rhonchi Suctioning: Airway Yes   Patient extubated to 3L nasal cannula per MD order.  Positive cuff leak noted.  Patient able to speak post extubation.  No evidence of stridor.  Sats currently 100%.  Vitals are stable.  No apparent complications.   Philomena Doheny 07/24/2015, 12:27 PM

## 2015-07-24 NOTE — Progress Notes (Signed)
Right femoral sheath removed per protocol. Pressure held for 25 minutes with 2 Rn's assisting . Vss,right femoral artery level 0.Pressure dressing applied. Etta Quill

## 2015-07-25 LAB — COMPREHENSIVE METABOLIC PANEL
ALBUMIN: 2.3 g/dL — AB (ref 3.5–5.0)
ALK PHOS: 56 U/L (ref 38–126)
ALT: 41 U/L (ref 14–54)
ANION GAP: 7 (ref 5–15)
AST: 55 U/L — ABNORMAL HIGH (ref 15–41)
BUN: 5 mg/dL — ABNORMAL LOW (ref 6–20)
CALCIUM: 8 mg/dL — AB (ref 8.9–10.3)
CHLORIDE: 105 mmol/L (ref 101–111)
CO2: 26 mmol/L (ref 22–32)
CREATININE: 0.82 mg/dL (ref 0.44–1.00)
GFR calc Af Amer: >60 mL/min (ref >60)
GFR calc non Af Amer: >60 mL/min (ref >60)
GLUCOSE: 100 mg/dL — AB (ref 65–99)
Potassium: 4 mmol/L (ref 3.5–5.1)
Sodium: 138 mmol/L (ref 135–145)
Total Bilirubin: 0.7 mg/dL (ref 0.3–1.2)
Total Protein: 5.5 g/dL — ABNORMAL LOW (ref 6.5–8.1)

## 2015-07-25 LAB — CBC
HEMATOCRIT: 26.1 % — AB (ref 36.0–46.0)
Hemoglobin: 8.5 g/dL — ABNORMAL LOW (ref 12.0–15.0)
MCH: 27.2 pg (ref 26.0–34.0)
MCHC: 32.6 g/dL (ref 30.0–36.0)
MCV: 83.4 fL (ref 78.0–100.0)
Platelets: 273 10*3/uL (ref 150–400)
RBC: 3.13 MIL/uL — ABNORMAL LOW (ref 3.87–5.11)
RDW: 15.6 % — AB (ref 11.5–15.5)
WBC: 8.8 10*3/uL (ref 4.0–10.5)

## 2015-07-25 LAB — TROPONIN I: Troponin I: 10.76 ng/mL (ref <0.031)

## 2015-07-25 MED ORDER — FERROUS SULFATE 325 (65 FE) MG PO TABS
325.0000 mg | ORAL_TABLET | Freq: Two times a day (BID) | ORAL | Status: DC
  Administered 2015-07-25 – 2015-07-27 (×4): 325 mg via ORAL
  Filled 2015-07-25 (×4): qty 1

## 2015-07-25 MED ORDER — DOCUSATE SODIUM 100 MG PO CAPS
100.0000 mg | ORAL_CAPSULE | Freq: Two times a day (BID) | ORAL | Status: DC
  Administered 2015-07-25 – 2015-07-27 (×3): 100 mg via ORAL
  Filled 2015-07-25 (×4): qty 1

## 2015-07-25 NOTE — Progress Notes (Signed)
SUBJECTIVE:  Extubated.  Feels well.  No chest pain.  Has some right thigh pain.  Right groin sheath was removed yesterday. Right groin venous sheath needs to come out today.   OBJECTIVE:   Vitals:   Filed Vitals:   07/25/15 0630 07/25/15 0645 07/25/15 0800 07/25/15 0900  BP:   100/49 119/52  Pulse: 80 76 78 84  Temp:   98 F (36.7 C)   TempSrc:   Oral   Resp: 9 15 14    Height:      Weight:      SpO2: 97% 98% 98% 100%   I&O's:    Intake/Output Summary (Last 24 hours) at 07/25/15 0946 Last data filed at 07/25/15 0900  Gross per 24 hour  Intake 1810.6 ml  Output   1000 ml  Net  810.6 ml   TELEMETRY: Reviewed telemetry pt in normal sinus rhythm with short bursts of nonsustained ventricular tachycardia:     PHYSICAL EXAM General: Well developed, well nourished, in no acute distress Head:   Normal cephalic and atramatic  Lungs:   Clear bilaterally to auscultation. Heart:   HRRR S1 S2  No JVD.   Abdomen: abdomen soft and non-tender Msk:  Back normal,  Normal strength and tone for age. Extremities:   No edema.  No right groin hematoma.  Venous line in place in the right groin. Neuro: Alert and oriented. Psych:  Normal affect, responds appropriately Skin: No rash   LABS: Basic Metabolic Panel:  Recent Labs  07/23/15 2128 07/24/15 0313 07/25/15 0510  NA 139 139 138  K 3.7 3.5 4.0  CL 105 108 105  CO2 25 23 26   GLUCOSE 105* 98 100*  BUN 11 8 5*  CREATININE 0.80 0.60 0.82  CALCIUM 8.2* 7.9* 8.0*  MG 1.8 1.6*  --   PHOS  --  3.1  --    Liver Function Tests:  Recent Labs  07/23/15 2128 07/25/15 0510  AST 129* 55*  ALT 70* 41  ALKPHOS 64 56  BILITOT 0.5 0.7  PROT 5.6* 5.5*  ALBUMIN 2.4* 2.3*   No results for input(s): LIPASE, AMYLASE in the last 72 hours. CBC:  Recent Labs  07/24/15 0313 07/25/15 0510  WBC 9.9 8.8  HGB 9.2* 8.5*  HCT 28.5* 26.1*  MCV 82.4 83.4  PLT 281 273   Cardiac Enzymes:  Recent Labs  07/24/15 0313 07/24/15 1030  07/25/15 0510  TROPONINI 22.34* 19.59* 10.76*   BNP: Invalid input(s): POCBNP D-Dimer: No results for input(s): DDIMER in the last 72 hours. Hemoglobin A1C: No results for input(s): HGBA1C in the last 72 hours. Fasting Lipid Panel:  Recent Labs  07/24/15 0313  CHOL 154  HDL 30*  LDLCALC 94  TRIG 148  CHOLHDL 5.1   Thyroid Function Tests:  Recent Labs  07/23/15 2128  TSH 1.760   Anemia Panel: No results for input(s): VITAMINB12, FOLATE, FERRITIN, TIBC, IRON, RETICCTPCT in the last 72 hours. Coag Panel:   Lab Results  Component Value Date   INR 1.63* 07/23/2015   INR 1.01 07/20/2015    RADIOLOGY: Dg Chest Port 1 View  07/24/2015  CLINICAL DATA:  Status post intubation. EXAM: PORTABLE CHEST 1 VIEW COMPARISON:  07/23/2015 FINDINGS: Endotracheal tube terminates approximately 2.5 cm above the carina. Enteric catheter has been advanced, tip collimated off the image at the level of the gastric antrum. Cardiomediastinal silhouette is normal. Mediastinal contours appear intact. There is no evidence of focal airspace consolidation, pleural effusion or pneumothorax.  Lung volumes are low. Osseous structures are without acute abnormality. Soft tissues are grossly normal. IMPRESSION: Stable position of the endotracheal tube. Interval advancement of the enteric catheter, tip collimated off the image at the level of the gastric antrum. Low lung volumes. Electronically Signed   By: Fidela Salisbury M.D.   On: 07/24/2015 09:23   Dg Chest Portable 1 View  07/23/2015  CLINICAL DATA:  Stroke.  Endotracheal tube placement EXAM: PORTABLE CHEST 1 VIEW COMPARISON:  10/22/2013 FINDINGS: Endotracheal tube tip is between the clavicular heads and carina. An orogastric tube reaches the stomach. Defibrillator pads over the left chest, obscuring the underlying lung. There is cardiopericardial enlargement and vascular pedicle widening which is accentuated by positioning and low volumes. No gross  consolidation or edema.  No pneumothorax. IMPRESSION: 1. Unremarkable positioning of endotracheal and orogastric tubes. 2. Low volumes. Electronically Signed   By: Monte Fantasia M.D.   On: 07/23/2015 13:56   Dg Hand Complete Right  06/30/2015  CLINICAL DATA:  Swelling in the right hand.  No reported injury. EXAM: RIGHT HAND - COMPLETE 3+ VIEW COMPARISON:  None. FINDINGS: No fracture, dislocation or suspicious focal osseous lesion. Mild osteoarthritis at distal interphalangeal joint of the right third finger. Minimal osteoarthritis at the third metacarpophalangeal joint. No joint erosions. IMPRESSION: Mild polyarticular osteoarthritis in the right hand as described. Electronically Signed   By: Ilona Sorrel M.D.   On: 06/30/2015 21:15      ASSESSMENT: Acute inferior wall MI/stent thrombosis   PLAN:  Treated with balloon angioplasty of recently placed RCA stent. Rhythm seems to be stable. Now off the ventilator.  Recent ventricular fibrillation at the time of presentation on 1/20. Currently on IV amiodarone.  Will stop this today.   Anemia: This will need to be followed. No obvious right groin hematoma. She does have palpable pulse in her right foot.  Likely some blood loss anemia.  No indication for transfusion at this time. Will start iron and colace.  Hyperlipidemia: Continue lipid-lowering therapy. LDL 94 which is above target.  Needs to ambulate once lines are out.      Jettie Booze, MD  07/25/2015  9:46 AM

## 2015-07-26 ENCOUNTER — Encounter (HOSPITAL_COMMUNITY): Payer: Self-pay | Admitting: Cardiovascular Disease

## 2015-07-26 LAB — HEMOGLOBIN A1C
HEMOGLOBIN A1C: 6.4 % — AB (ref 4.8–5.6)
Mean Plasma Glucose: 137 mg/dL

## 2015-07-26 MED ORDER — METOPROLOL TARTRATE 12.5 MG HALF TABLET
12.5000 mg | ORAL_TABLET | Freq: Two times a day (BID) | ORAL | Status: DC
  Administered 2015-07-26 – 2015-07-27 (×3): 12.5 mg via ORAL
  Filled 2015-07-26 (×3): qty 1

## 2015-07-26 MED FILL — Nitroglycerin IV Soln 100 MCG/ML in D5W: INTRA_ARTERIAL | Qty: 10 | Status: AC

## 2015-07-26 NOTE — Progress Notes (Signed)
CARDIAC REHAB PHASE I   PRE:  Rate/Rhythm: 85 SR  BP:  Supine:   Sitting: 103/65  Standing:    SaO2:   MODE:  Ambulation: 270 ft   POST:  Rate/Rhythm: 115 ST  BP:  Supine:   Sitting: 120/64  Standing:    SaO2:  0845-0940 Pt walked 270 ft on RA with gait belt use and rolling walker due to groin sore. Pt tolerated well without CP. MI education began with pt and fiance' who voiced understanding. Stressed importance of effient with stent and pt needs case Freight forwarder to see. Reviewed NTG use, risk factors, heart healthy diet and CRP 2. Pt will need ex ed tomorrow. Also discussed stress management. Will send a letter of interest to [facility] as I cannot write order for [place]. Will put in letter that pt is being considered for staged PCI. Pt able to answer teach back questions and fiance' very attentive.   Graylon Good, RN BSN  07/26/2015 9:35 AM

## 2015-07-26 NOTE — Progress Notes (Signed)
    Subjective:  Denies CP or dyspnea   Objective:  Filed Vitals:   07/26/15 0306 07/26/15 0400 07/26/15 0500 07/26/15 0600  BP: 115/50 105/43 90/48 108/49  Pulse: 79 73 83 76  Temp:  98 F (36.7 C)    TempSrc:  Oral    Resp:      Height:      Weight:   287 lb 14.7 oz (130.6 kg)   SpO2: 95% 95% 96% 97%    Intake/Output from previous day:  Intake/Output Summary (Last 24 hours) at 07/26/15 0723 Last data filed at 07/26/15 0200  Gross per 24 hour  Intake 1823.4 ml  Output    575 ml  Net 1248.4 ml    Physical Exam: Physical exam: Well-developed obese in no acute distress.  Skin is warm and dry.  HEENT is normal.  Neck is supple.  Chest is clear to auscultation with normal expansion.  Cardiovascular exam is regular rate and rhythm.  Abdominal exam nontender or distended. No masses palpated. Right groin with no hematoma and no bruit Extremities show no edema. neuro grossly intact    Lab Results: Basic Metabolic Panel:  Recent Labs  07/23/15 2128 07/24/15 0313 07/25/15 0510  NA 139 139 138  K 3.7 3.5 4.0  CL 105 108 105  CO2 25 23 26   GLUCOSE 105* 98 100*  BUN 11 8 5*  CREATININE 0.80 0.60 0.82  CALCIUM 8.2* 7.9* 8.0*  MG 1.8 1.6*  --   PHOS  --  3.1  --    CBC:  Recent Labs  07/24/15 0313 07/25/15 0510  WBC 9.9 8.8  HGB 9.2* 8.5*  HCT 28.5* 26.1*  MCV 82.4 83.4  PLT 281 273   Cardiac Enzymes:  Recent Labs  07/24/15 0313 07/24/15 1030 07/25/15 0510  TROPONINI 22.34* 19.59* 10.76*     Assessment/Plan:  1 s/p MI-patient is status post inferior infarctcomplicated by ventricular fibrillation arrest. Her rhythm has been stable over the past 24 hours. Continue aspirin, effient and statin; continue metoprolol. Note blood pressure is low. Decrease metoprolol to 12.5 mg twice a day. 2 residual coronary artery disease in LAD-discussed with Dr. Gwenlyn Found. Patient will follow-up in [place] with Dr Saralyn Pilar and may need elective PCI after she  recovers from present event. 3 hyperlipidemia-continue statin. 4 anemia-etiology unclear. Hemoglobin appears to be stable. Right groin shows no hematoma. Question retroperitoneal bleed. Patient is not complaining of back pain. Recheck hemoglobin tomorrow morning. Transfer to telemetry. Ambulate today. Discharge tomorrow morning if stable. Kirk Ruths 07/26/2015, 7:23 AM

## 2015-07-27 LAB — CBC
HEMATOCRIT: 27.1 % — AB (ref 36.0–46.0)
Hemoglobin: 8.4 g/dL — ABNORMAL LOW (ref 12.0–15.0)
MCH: 25.9 pg — ABNORMAL LOW (ref 26.0–34.0)
MCHC: 31 g/dL (ref 30.0–36.0)
MCV: 83.6 fL (ref 78.0–100.0)
Platelets: 343 10*3/uL (ref 150–400)
RBC: 3.24 MIL/uL — ABNORMAL LOW (ref 3.87–5.11)
RDW: 15.3 % (ref 11.5–15.5)
WBC: 8.5 10*3/uL (ref 4.0–10.5)

## 2015-07-27 MED ORDER — ATORVASTATIN CALCIUM 80 MG PO TABS: 80.0000 mg | ORAL_TABLET | Freq: Every day | ORAL | Status: DC

## 2015-07-27 MED ORDER — NITROGLYCERIN 0.4 MG SL SUBL: 0.4000 mg | SUBLINGUAL_TABLET | SUBLINGUAL | Status: DC | PRN

## 2015-07-27 MED ORDER — ASPIRIN 81 MG PO CHEW: 81.0000 mg | CHEWABLE_TABLET | Freq: Every day | ORAL | Status: DC

## 2015-07-27 MED ORDER — ATORVASTATIN CALCIUM 80 MG PO TABS: 40.0000 mg | ORAL_TABLET | Freq: Every day | ORAL | Status: DC

## 2015-07-27 MED ORDER — PRASUGREL HCL 10 MG PO TABS: 10.0000 mg | ORAL_TABLET | Freq: Every day | ORAL | Status: DC

## 2015-07-27 NOTE — Progress Notes (Signed)
    Subjective:  Denies CP or dyspnea; "sore" at cath site   Objective:  Filed Vitals:   07/26/15 1200 07/26/15 1415 07/26/15 2247 07/27/15 0536  BP: 99/61 94/65 107/58 125/61  Pulse:  88 86 81  Temp: 98.4 F (36.9 C) 98.6 F (37 C) 98.3 F (36.8 C) 98.4 F (36.9 C)  TempSrc: Oral Oral Oral Oral  Resp: 15 15 18 18   Height:      Weight:    287 lb 7.7 oz (130.4 kg)  SpO2: 95% 100% 97% 100%    Intake/Output from previous day:  Intake/Output Summary (Last 24 hours) at 07/27/15 0840 Last data filed at 07/26/15 2249  Gross per 24 hour  Intake    720 ml  Output      0 ml  Net    720 ml    Physical Exam: Physical exam: Well-developed obese in no acute distress.  Skin is warm and dry.  HEENT is normal.  Neck is supple.  Chest is clear to auscultation with normal expansion.  Cardiovascular exam is regular rate and rhythm.  Abdominal exam nontender or distended. No masses palpated. Right groin with no hematoma and no bruit; mild tenderness to palpation Extremities show no edema. neuro grossly intact    Lab Results: Basic Metabolic Panel:  Recent Labs  07/25/15 0510  NA 138  K 4.0  CL 105  CO2 26  GLUCOSE 100*  BUN 5*  CREATININE 0.82  CALCIUM 8.0*   CBC:  Recent Labs  07/25/15 0510 07/27/15 0403  WBC 8.8 8.5  HGB 8.5* 8.4*  HCT 26.1* 27.1*  MCV 83.4 83.6  PLT 273 343   Cardiac Enzymes:  Recent Labs  07/24/15 1030 07/25/15 0510  TROPONINI 19.59* 10.76*     Assessment/Plan:  1 s/p MI-patient is status post inferior infarctcomplicated by ventricular fibrillation arrest. Her rhythm has been stable. Continue aspirin, effient and statin; continue metoprolol.  2 residual coronary artery disease in LAD-previously discussed with Dr. Gwenlyn Found. Patient will follow-up in [place] with Dr Saralyn Pilar and may need elective PCI after she recovers from present event. 3 hyperlipidemia-continue statin. 4 anemia-etiology unclear. Hemoglobin appears to be  stable. Right groin shows no hematoma although mild tenderness to palpation. Question retroperitoneal bleed. Patient is not complaining of back pain. Hgb stable. Recheck as outpt DC today and fu as above >30 min PA and physician time D2  Kirk Ruths 07/27/2015, 8:40 AM

## 2015-07-27 NOTE — Progress Notes (Signed)
CARDIAC REHAB PHASE I   PRE:  Rate/Rhythm: 88 SR  BP:  Supine: 119/60  Sitting:   Standing:    SaO2:   MODE:  Ambulation: 550 ft   POST:  Rate/Rhythm: 120 ST  BP:  Supine:   Sitting: 126/60  Standing:    SaO2:  1000-1040 Pt reading MI booklet when I entered room. Pt in good spirits. Pt walked 550 ft with rolling walker stopping once to rest. Denied CP. To recliner after walk. Reviewed ex ed with pt and stressed again importance of effient with stent. Pt stated she had card but when I went to ask case manager about rolling walker, they were unaware. The case manager to follow up with pt prior to discharge. Told pt her A1C is elevated and she really needs to watch carbs. Her fiance' is a diabetic and encouraged her to watch carbs as he does. A1C at 6.4. Encouraged weight loss and ex. Walking instructions given and encouraged her to listen to her body as she may need staged PCI and want to increase activity gradually.   Graylon Good, RN BSN  07/27/2015 10:35 AM

## 2015-07-27 NOTE — Care Management Note (Addendum)
Case Management Note  Patient Details  Name: Sarah Medina MRN: [id number] Date of Birth: 18-Aug-1965  Subjective/Objective:  Pt admitted with STEMI                  Action/Plan:   Pt is from home independent with husband.  Pt s/p stent placement at [place].  Pt is active with [facility], pt states she has PCP there, pt also stated that they do send their pts to a offsite pharmacy to get medications filled, in the past she states she had $0 copay.  Pt will discharge on Effient and currently doesn't have presription insurance.    Expected Discharge Date:                  Expected Discharge Plan:  Home/Self Care  In-House Referral:     Discharge planning Services  CM Consult, Medication Assistance  Post Acute Care Choice:    Choice offered to:  Patient  DME Arranged:  Walker rolling DME Agency:  [place]:    [place]:     Status of Service:  Completed, signed off  Medicare Important Message Given:    Date Medicare IM Given:    Medicare IM give by:    Date Additional Medicare IM Given:    Additional Medicare Important Message give by:     If discussed at [place] of Stay Meetings, dates discussed:    Additional Comments: PA filled out MD portion of assistance application  CM provide pt the [employer] form and informed pt that it is imperative that she fills out form ASAP to gain access to resources from the company to ensure that she will have the medication when the 30 day free trial inventory is depleted.  CM asked pt to fill out the form today and went over the packet; emphasized phone numbers, actual assistance applications, addresses.  CM verfied that pt has free 30 card within expiration date.  Pt stated she uses [facility] in , CM contacted pharmacy and was informed that they can fill the medication, pharmacist stated she would place 30 days supply aside for pt.  CM instructed pt that if she thinks she will run out  of medication she needs to contact her cardiologist.    Maryclare Labrador, RN 07/27/2015, 12:39 PM

## 2015-07-27 NOTE — Discharge Summary (Signed)
Discharge Summary    Patient ID: Sarah Medina,  MRN: [id number], DOB/AGE: [birth year] 50 y.o.  Admit date: 07/23/2015 Discharge date: 07/27/2015  Primary Care Provider: PROVIDER NOT [place] Primary Cardiologist: Dr. Saralyn Pilar    Discharge Diagnoses    Principal Problem:   ST elevation myocardial infarction (STEMI) of inferior wall [facility]) Active Problems:   Ventricular fibrillation (HCC)   H/O myocardial infarction less than 8 weeks- STEMI inf wall 07/20/15- stent to RCA   Hyperlipidemia LDL goal <70   CAD in native artery, RCA, LAD and LCX   Acute posthemorrhagic anemia   Allergies No Known Allergies   History of Present Illness     50 y/o F with PMH of HLD and recent admission to [facility] on 07/20/15 with prolonged chest pain. EKG at that time demonstrated an acute inferior MI. She underwent an emergent left heart cath which demonstrated an occluded proximal right coronary s/p PCI (xience alpine stent, drug eluting). Peak troponin 12.98. On 1/19, she was discharged home on plavix, ASA 325, lipitor, enalapril and beta blocker. She also was to complete a prednisone taper for facial swelling (not angioedema, periorbital edema).   The patient presented to [facility] on 07/23/15 via EMS with worsening chest pain (they could not go to  due to emergency there in cath lab). Initially, she was awake / alert. However while sliding from EMS stretcher to ER stretcher she developed a VF arrest and shocked x1 to SR. CPR initiated. After the code, documentation notes the patient to be "screaming & crying". She was taken emergently to cath lab for intervention. During the cath, the patient had 4 episodes of VF requiring shock. She was placed on IV amiodarone. LHC revealed an occluded non-codominant RCA stent placed just 3 days prior. Dr. Gwenlyn Found was able with some difficulty to cross this with a guidewire and perform PCI with a 2 mm balloon followed by a 2.5 mm noncompliant balloon. The patient  received weight based bivalirudin in addition to crushed Effient. There was residual thrombus with TIMI 2 flow. She has residual 80% proximal LAD stenosis which will need to be addressed at a later date. She'll be treated with dual antiplatelet therapy including aspirin and Effient in addition to beta blocker and standard post MI medications. She required intubation and PCCM was consulted for vent management. 2D ECHO showed normal LV function with EF 65-70%.    Hospital Course     Consultants: PCCM for intubation   1. Inferior STEMI: patient is status post inferior infarct complicated by ventricular fibrillation arrest. Treated with balloon angioplasty of recently placed stent. Peak troponin 22.34  -- Continue aspirin/effient, metoprolol and statin. Effient patient assistance forms filled out  2. Residual CAD in LAD: previously discussed with Dr. Gwenlyn Found. Patient will follow-up in [place] with Dr Saralyn Pilar and may need elective PCI after she recovers from present event.  3. V fib arrest: placed on IV amiodarone initially. This was discontinued 07/25/15. Rhythm remained stable.   4. HLD: continue statin.  5. Anemia: etiology unclear. Hemoglobin appears to be stable. Right groin shows no hematoma although mild tenderness to palpation. Question retroperitoneal bleed. Patient is not complaining of back pain. Hgb stable. H/H 8.4/27.1. Recheck CBC as an outpatient.  6. HTN: enalapril discontinued due to hypotension. If BPs become elevated, this can be added back as an outpatient.   The patient has had an uncomplicated hospital course and is recovering well. The femoral catheter site is stable. She has been seen by  Dr. Stanford Breed today and deemed ready for discharge home. All follow-up appointments have been scheduled. Discharge medications are listed below.  _____________  Discharge Vitals Blood pressure 109/55, pulse 81, temperature 98.1 F (36.7 C), temperature source Oral, resp. rate 19, height  5\' 6"  (1.676 m), weight 287 lb 7.7 oz (130.4 kg), last menstrual period 05/19/2015, SpO2 100 %.  Filed Weights   07/25/15 0451 07/26/15 0500 07/27/15 0536  Weight: 282 lb 3 oz (128 kg) 287 lb 14.7 oz (130.6 kg) 287 lb 7.7 oz (130.4 kg)    Labs & Radiologic Studies     CBC  Recent Labs  07/25/15 0510 07/27/15 0403  WBC 8.8 8.5  HGB 8.5* 8.4*  HCT 26.1* 27.1*  MCV 83.4 83.6  PLT 273 [id number]   Basic Metabolic Panel  Recent Labs  07/25/15 0510  NA 138  K 4.0  CL 105  CO2 26  GLUCOSE 100*  BUN 5*  CREATININE 0.82  CALCIUM 8.0*   Liver Function Tests  Recent Labs  07/25/15 0510  AST 55*  ALT 41  ALKPHOS 56  BILITOT 0.7  PROT 5.5*  ALBUMIN 2.3*    Cardiac Enzymes  Recent Labs  07/25/15 0510  TROPONINI 10.76*    Dg Chest Port 1 View  07/24/2015  CLINICAL DATA:  Status post intubation. EXAM: PORTABLE CHEST 1 VIEW COMPARISON:  07/23/2015 FINDINGS: Endotracheal tube terminates approximately 2.5 cm above the carina. Enteric catheter has been advanced, tip collimated off the image at the level of the gastric antrum. Cardiomediastinal silhouette is normal. Mediastinal contours appear intact. There is no evidence of focal airspace consolidation, pleural effusion or pneumothorax. Lung volumes are low. Osseous structures are without acute abnormality. Soft tissues are grossly normal. IMPRESSION: Stable position of the endotracheal tube. Interval advancement of the enteric catheter, tip collimated off the image at the level of the gastric antrum. Low lung volumes. Electronically Signed   By: Fidela Salisbury M.D.   On: 07/24/2015 09:23   Dg Chest Portable 1 View  07/23/2015  CLINICAL DATA:  Stroke.  Endotracheal tube placement EXAM: PORTABLE CHEST 1 VIEW COMPARISON:  10/22/2013 FINDINGS: Endotracheal tube tip is between the clavicular heads and carina. An orogastric tube reaches the stomach. Defibrillator pads over the left chest, obscuring the underlying lung. There is  cardiopericardial enlargement and vascular pedicle widening which is accentuated by positioning and low volumes. No gross consolidation or edema.  No pneumothorax. IMPRESSION: 1. Unremarkable positioning of endotracheal and orogastric tubes. 2. Low volumes. Electronically Signed   By: Monte Fantasia M.D.   On: 07/23/2015 13:56   Dg Hand Complete Right  06/30/2015  CLINICAL DATA:  Swelling in the right hand.  No reported injury. EXAM: RIGHT HAND - COMPLETE 3+ VIEW COMPARISON:  None. FINDINGS: No fracture, dislocation or suspicious focal osseous lesion. Mild osteoarthritis at distal interphalangeal joint of the right third finger. Minimal osteoarthritis at the third metacarpophalangeal joint. No joint erosions. IMPRESSION: Mild polyarticular osteoarthritis in the right hand as described. Electronically Signed   By: Ilona Sorrel M.D.   On: 06/30/2015 21:15     Diagnostic Studies/Procedures    Procedures    Coronary Balloon Angioplasty   Left Heart Cath and Coronary Angiography    Conclusion     Prox LAD lesion, 80% stenosed.  Prox RCA lesion, 100% stenosed. Post intervention, there is a 0% residual stenosis. The lesion was previously treated with a stent (unknown type).   DATE OF PROCEDURE: 07/23/2015 CARDIAC CATHETERIZATION / PCI History  obtained from chart review. 50 year old female with STEMI 07/20/15 inf MI on EKG and underwent emergent Cath with prox occ of codominate rt coronary artery with successful PCI with Xience Alpine stent to prox RCA. She was discharged from [facility] 07/22/15 Her pk troponin was 12.98. D/c'd with plavix, asa 325, lipitor 40, enalapril, BB, and prednisone but decreasing doses- due to facial preseptal and periorbital swelling 2 days post cath. Not angioedema.  Today developed chest pain at home EMS called STEMI Called but could not go to [place] due to emergency there in cath lab so to [facility], she had ASA and on arrival here she still had some chest pain. While  sliding from ems stretcher to ER stretcher she had V Fib arrest and shocked X 1 to SR. Initial EKG here SR with no inf ST elevation. + V3 ST elevation. Once intubated and sedated follow up EKG with ST elevation in inf leads and V3. I Have reviewed cath films and 80% LAD stenosis at time of previous cath. She was intubated, sedated and brought to the Cath Lab for urgent potential intervention.  IMPRESSION: Ms. Hardin Negus had an occluded non-codominant RCA stent placed just 3 days ago. I was able with some difficulty to cross this with a guidewire and performed PCI with a 2 mm balloon followed by a 2.5 mm noncompliant balloon. The patient received weight based bivalirudin in addition to crushed Effient. There was residual thrombus with TIMI 2 flow and the patient was therefore placed on have VF during the case requiring de fibrillation 4 times. She has residual 80% proximal LAD stenosis which will need to be addressed at a later date. She'll be treated with dual antiplatelet therapy including aspirin and Effient in addition to beta blocker and standard post MI medications.  Quay Burow. MD, [facility] 07/23/2015 3:35 PM  Coronary Findings    Dominance: Co-dominant   Left Anterior Descending   . Prox LAD lesion, 80% stenosed.     Right Coronary Artery   . Prox RCA lesion, 100% stenosed. The lesion was previously treated with a stent (unknown type).   . PCI: An unspecified stent was placed.  . There is no residual stenosis post intervention.        Coronary Diagrams    Diagnostic Diagram           Post-Intervention Diagram             2D ECHO: 07/24/2015 LV EF: 65% -   70% Study Conclusions - Left ventricle: The cavity size was normal. Systolic function was   vigorous. The estimated ejection fraction was in the range of 65%   to 70%. There was dynamic obstruction at an indeterminate   location, with a peak velocity of 200 cm/sec and a peak gradient   of 16 mm Hg. -  Pericardium, extracardiac: A trivial pericardial effusion was   identified. - Recommendations: Suggest repeat study with definity to better   look at wall motion and outflow tract.   Disposition   Pt is being discharged home today in good condition.  Follow-up Plans & Appointments    Follow-up Information    Follow up with PARASCHOS,ALEXANDER, MD On 08/02/2015.   Specialty:  Cardiology   Why:  @ 3pm   Contact information:   [facility] [place] [address] [phone]       Follow up with [place].   Why:  rolling walker   Contact  information:   [address] [place] [address] [phone]        Discharge Medications   Current Discharge Medication List    START taking these medications   Details  aspirin 81 MG chewable tablet Chew 1 tablet (81 mg total) by mouth daily.    nitroGLYCERIN (NITROSTAT) 0.4 MG SL tablet Place 1 tablet (0.4 mg total) under the tongue every 5 (five) minutes x 3 doses as needed for chest pain. Qty: 25 tablet, Refills: 12    prasugrel (EFFIENT) 10 MG TABS tablet Take 1 tablet (10 mg total) by mouth daily. Qty: 90 tablet, Refills: 9      CONTINUE these medications which have CHANGED   Details  atorvastatin (LIPITOR) 80 MG tablet Take 1 tablet (80 mg total) by mouth daily at 6 PM. Qty: 30 tablet, Refills: 11      CONTINUE these medications which have NOT CHANGED   Details  diphenhydrAMINE (BENADRYL) 25 mg capsule Take 1 capsule (25 mg total) by mouth every 6 (six) hours as needed. Qty: 30 capsule, Refills: 0    hydrOXYzine (VISTARIL) 25 MG capsule Take 1 capsule (25 mg total) by mouth 3 (three) times daily as needed for itching. Take 1-2 tabs by mouth 3 (three) times a day as needed for itching Qty: 30 capsule, Refills: 0    metoprolol tartrate (LOPRESSOR) 25 MG tablet Take 1 tablet (25 mg total) by mouth 2 (two) times daily. Qty: 60 tablet, Refills: 5      ranitidine (ZANTAC) 150 MG tablet Take 1 tablet (150 mg total) by mouth 2 (two) times daily. Qty: 20 tablet, Refills: 0      STOP taking these medications     aspirin EC 325 MG EC tablet      clopidogrel (PLAVIX) 75 MG tablet      enalapril (VASOTEC) 5 MG tablet      predniSONE (DELTASONE) 10 MG tablet          Aspirin prescribed at discharge?  Yes High Intensity Statin Prescribed? (Lipitor 40-80mg  or Crestor 20-40mg ): Yes Beta Blocker Prescribed? Yes For EF 45% or less, Was ACEI/ARB Prescribed? No: n/a ADP Receptor Inhibitor Prescribed? (i.e. Plavix etc.-Includes Medically Managed Patients): Yes For EF <40%, Aldosterone Inhibitor Prescribed? No, n/a Was EF assessed during THIS hospitalization? Yes Was Cardiac Rehab II ordered? (Included Medically managed Patients): No: does not follow with our group    Outstanding Labs/Studies   CBC- to check H/G  Duration of Discharge Encounter   Greater than 30 minutes including physician time.  SignedGrandville Silos, KATHRYN R PA-C 07/27/2015, 1:22 PM

## 2015-07-30 MED FILL — Tirofiban HCl in NaCl 0.9% IV Soln 12.5 MG/250ML (Base Eq): INTRAVENOUS | Qty: 250 | Status: AC

## 2015-07-31 ENCOUNTER — Encounter: Payer: Self-pay | Admitting: Emergency Medicine

## 2015-07-31 ENCOUNTER — Emergency Department
Admission: EM | Admit: 2015-07-31 | Discharge: 2015-07-31 | Disposition: A | Discharge status: Home / Self Care | Payer: Medicaid Other | Attending: Emergency Medicine | Admitting: Emergency Medicine

## 2015-07-31 DIAGNOSIS — Z791 Long term (current) use of non-steroidal anti-inflammatories (NSAID): Secondary | ICD-10-CM | POA: Insufficient documentation

## 2015-07-31 DIAGNOSIS — Z7902 Long term (current) use of antithrombotics/antiplatelets: Secondary | ICD-10-CM | POA: Insufficient documentation

## 2015-07-31 DIAGNOSIS — T783XXA Angioneurotic edema, initial encounter: Secondary | ICD-10-CM

## 2015-07-31 DIAGNOSIS — T464X5A Adverse effect of angiotensin-converting-enzyme inhibitors, initial encounter: Secondary | ICD-10-CM | POA: Insufficient documentation

## 2015-07-31 DIAGNOSIS — Z7982 Long term (current) use of aspirin: Secondary | ICD-10-CM | POA: Diagnosis not present

## 2015-07-31 DIAGNOSIS — I252 Old myocardial infarction: Secondary | ICD-10-CM | POA: Diagnosis not present

## 2015-07-31 DIAGNOSIS — R220 Localized swelling, mass and lump, head: Secondary | ICD-10-CM | POA: Diagnosis present

## 2015-07-31 DIAGNOSIS — Z79899 Other long term (current) drug therapy: Secondary | ICD-10-CM | POA: Diagnosis not present

## 2015-07-31 LAB — CBC WITH DIFFERENTIAL/PLATELET
BASOS ABS: 0.1 10*3/uL (ref 0–0.1)
BASOS PCT: 1 %
EOS ABS: 0.2 10*3/uL (ref 0–0.7)
Eosinophils Relative: 2 %
HCT: 29.5 % — ABNORMAL LOW (ref 35.0–47.0)
HEMOGLOBIN: 9.8 g/dL — AB (ref 12.0–16.0)
LYMPHS ABS: 2.2 10*3/uL (ref 1.0–3.6)
Lymphocytes Relative: 22 %
MCH: 27.1 pg (ref 26.0–34.0)
MCHC: 33.2 g/dL (ref 32.0–36.0)
MCV: 81.6 fL (ref 80.0–100.0)
Monocytes Absolute: 0.8 10*3/uL (ref 0.2–0.9)
Monocytes Relative: 8 %
Neutro Abs: 6.9 10*3/uL — ABNORMAL HIGH (ref 1.4–6.5)
Neutrophils Relative %: 67 %
Platelets: 493 10*3/uL — ABNORMAL HIGH (ref 150–440)
RBC: 3.62 MIL/uL — AB (ref 3.80–5.20)
RDW: 15.8 % — ABNORMAL HIGH (ref 11.5–14.5)
WBC: 10.2 10*3/uL (ref 3.6–11.0)

## 2015-07-31 LAB — BASIC METABOLIC PANEL
ANION GAP: 7 (ref 5–15)
BUN: 8 mg/dL (ref 6–20)
CHLORIDE: 105 mmol/L (ref 101–111)
CO2: 27 mmol/L (ref 22–32)
Calcium: 9 mg/dL (ref 8.9–10.3)
Creatinine, Ser: 0.77 mg/dL (ref 0.44–1.00)
GFR calc non Af Amer: >60 mL/min (ref >60)
Glucose, Bld: 122 mg/dL — ABNORMAL HIGH (ref 65–99)
POTASSIUM: 3.6 mmol/L (ref 3.5–5.1)
SODIUM: 139 mmol/L (ref 135–145)

## 2015-07-31 MED ORDER — DIPHENHYDRAMINE HCL 50 MG/ML IJ SOLN
50.0000 mg | Freq: Once | INTRAMUSCULAR | Status: AC
  Administered 2015-07-31: 50 mg via INTRAVENOUS
  Filled 2015-07-31: qty 1

## 2015-07-31 MED ORDER — METHYLPREDNISOLONE SODIUM SUCC 125 MG IJ SOLR
125.0000 mg | Freq: Once | INTRAMUSCULAR | Status: AC
  Administered 2015-07-31: 125 mg via INTRAVENOUS
  Filled 2015-07-31: qty 2

## 2015-07-31 NOTE — Discharge Instructions (Signed)
Angioedema °Angioedema is a sudden swelling of tissues, often of the skin. It can occur on the face or genitals or in the abdomen or other body parts. The swelling usually develops over a short period and gets better in 24 to 48 hours. It often begins during the night and is found when the person wakes up. The person may also get red, itchy patches of skin (hives). Angioedema can be dangerous if it involves swelling of the air passages.  °Depending on the cause, episodes of angioedema may only happen once, come back in unpredictable patterns, or repeat for several years and then gradually fade away.  °CAUSES  °Angioedema can be caused by an allergic reaction to various triggers. It can also result from nonallergic causes, including reactions to drugs, immune system disorders, viral infections, or an abnormal gene that is passed to you from your parents (hereditary). For some people with angioedema, the cause is unknown.  °Some things that can trigger angioedema include:  °· Foods.   °· Medicines, such as ACE inhibitors, ARBs, nonsteroidal anti-inflammatory agents, or estrogen.   °· Latex.   °· Animal saliva.   °· Insect stings.   °· Dyes used in X-rays.   °· Mild injury.   °· Dental work. °· Surgery. °· Stress.   °· Sudden changes in temperature.   °· Exercise. °SIGNS AND SYMPTOMS  °· Swelling of the skin. °· Hives. If these are present, there is also intense itching. °· Redness in the affected area.   °· Pain in the affected area. °· Swollen lips or tongue. °· Breathing problems. This may happen if the air passages swell. °· Wheezing. °If internal organs are involved, there may be:  °· Nausea.   °· Abdominal pain.   °· Vomiting.   °· Difficulty swallowing.   °· Difficulty passing urine. °DIAGNOSIS  °· Your health care provider will examine the affected area and take a medical and family history. °· Various tests may be done to help determine the cause. Tests may include: °¨ Allergy skin tests to see if the problem  is an allergic reaction.   °¨ Blood tests to check for hereditary angioedema.   °¨ Tests to check for underlying diseases that could cause the condition.   °· A review of your medicines, including over-the-counter medicines, may be done. °TREATMENT  °Treatment will depend on the cause of the angioedema. Possible treatments include:  °· Removal of anything that triggered the condition (such as stopping certain medicines).   °· Medicines to treat symptoms or prevent attacks. Medicines given may include:   °¨ Antihistamines.   °¨ Epinephrine injection.   °¨ Steroids.   °· Hospitalization may be required for severe attacks. If the air passages are affected, it can be an emergency. Tubes may need to be placed to keep the airway open. °HOME CARE INSTRUCTIONS  °· Take all medicines as directed by your health care provider. °· If you were given medicines for emergency allergy treatment, always carry them with you. °· Wear a medical bracelet as directed by your health care provider.   °· Avoid known triggers. °SEEK MEDICAL CARE IF:  °· You have repeat attacks of angioedema.   °· Your attacks are more frequent or more severe despite preventive measures.   °· You have hereditary angioedema and are considering having children. It is important to discuss with your health care provider the risks of passing the condition on to your children. °SEEK IMMEDIATE MEDICAL CARE IF:  °· You have severe swelling of the mouth, tongue, or lips. °· You have difficulty breathing.   °· You have difficulty swallowing.   °· You faint. °MAKE   SURE YOU:  Understand these instructions.  Will watch your condition.  Will get help right away if you are not doing well or get worse.   This information is not intended to replace advice given to you by your health care provider. Make sure you discuss any questions you have with your health care provider.   Document Released: 08/28/2001 Document Revised: 07/10/2014 Document Reviewed:  02/10/2013 Elsevier Interactive Patient Education [employer].  Please return immediately if condition worsens. Please contact her primary physician or the physician you were given for referral. If you have any specialist physicians involved in her treatment and plan please also contact them. Thank you for using [facility].  Please continue your current medications except for the Enalapril Please take over-the-counter Benadryl 50 mg every 6 hours around the clock for 48 hours. 50 mg and Benadryl is to over-the-counter 25 mg tablets.

## 2015-07-31 NOTE — ED Notes (Signed)
Noted facial swelling when woke this am, increasing during day, severe facial swelling noted. States does not feel like tongue is swollen.

## 2015-07-31 NOTE — ED Notes (Signed)
MD Quigley at bedside. 

## 2015-07-31 NOTE — ED Notes (Signed)
Discussed discharge instructions and follow-up care with patient. No questions or concerns at this time. Pt stable at discharge.  

## 2015-08-02 DIAGNOSIS — Z9889 Other specified postprocedural states: Secondary | ICD-10-CM | POA: Insufficient documentation

## 2015-08-02 DIAGNOSIS — Z955 Presence of coronary angioplasty implant and graft: Secondary | ICD-10-CM | POA: Insufficient documentation

## 2015-08-02 HISTORY — DX: Other specified postprocedural states: Z98.890

## 2015-08-04 DIAGNOSIS — R945 Abnormal results of liver function studies: Secondary | ICD-10-CM

## 2015-08-04 DIAGNOSIS — R7989 Other specified abnormal findings of blood chemistry: Secondary | ICD-10-CM

## 2015-08-04 DIAGNOSIS — M1711 Unilateral primary osteoarthritis, right knee: Secondary | ICD-10-CM

## 2015-08-05 NOTE — ED Provider Notes (Signed)
Time Seen: Approximately 1800  I have reviewed the triage notes  Chief Complaint: Angioedema   History of Present Illness: Sarah Medina is a 50 y.o. female *who presents with recent onset of swelling of both the upper and lower lips. Patient was recently discharged from the hospital and extensive cardiovascular evaluation at one time had a myocardial infarction with ST elevation and went into ventricular fibrillation. Patient denies any chest pain or shortness of breath. She was recently started on a medication for blood pressure. She denies any trouble with speech or swallowing. She is able to handle her own secretions and denies any tongue swelling. She denies any other new medications or exposures.   Past Medical History  Diagnosis Date  . Hypercholesteremia   . H/O myocardial infarction less than 8 weeks- STEMI inf wall 07/20/15- stent to RCA 07/20/15    inf wall STEMI with DES to RCA  . CAD in native artery, RCA, LAD and LCX 07/23/2015  . STEMI (ST elevation myocardial infarction) ([place]) 07/23/15  . Periorbital edema     treated with steroids    Patient Active Problem List   Diagnosis Date Noted  . Acute posthemorrhagic anemia   . H/O myocardial infarction less than 8 weeks- STEMI inf wall 07/20/15- stent to RCA 07/23/2015  . Hyperlipidemia LDL goal <70 07/23/2015  . CAD in native artery, RCA, LAD and LCX 07/23/2015  . Ventricular fibrillation ([place]) 07/23/2015  . ST elevation myocardial infarction (STEMI) of inferior wall ([place]) 07/20/2015  . Abnormal LFTs 12/23/2014  . Osteoarthritis of right knee 11/24/2014    Past Surgical History  Procedure Laterality Date  . Cardiac catheterization N/A 07/20/2015    Procedure: Left Heart Cath and Coronary Angiography;  Surgeon: Isaias Cowman, MD;  Location: [facility];  Service: Cardiovascular;  Laterality: N/A;  . Cardiac catheterization N/A 07/20/2015    Procedure: Coronary Stent Intervention;  Surgeon: Isaias Cowman, MD;  Location: [facility];  Service: Cardiovascular;  Laterality: N/A;  . Cardiac catheterization N/A 07/23/2015    Procedure: Left Heart Cath and Coronary Angiography;  Surgeon: Lorretta Harp, MD;  Location: [facility];  Service: Cardiovascular;  Laterality: N/A;  . Cardiac catheterization N/A 07/23/2015    Procedure: Coronary Balloon Angioplasty;  Surgeon: Lorretta Harp, MD;  Location: [facility];  Service: Cardiovascular;  Laterality: N/A;  . Knee surgery Right 15 years ago    Past Surgical History  Procedure Laterality Date  . Cardiac catheterization N/A 07/20/2015    Procedure: Left Heart Cath and Coronary Angiography;  Surgeon: Isaias Cowman, MD;  Location: [facility];  Service: Cardiovascular;  Laterality: N/A;  . Cardiac catheterization N/A 07/20/2015    Procedure: Coronary Stent Intervention;  Surgeon: Isaias Cowman, MD;  Location: [facility];  Service: Cardiovascular;  Laterality: N/A;  . Cardiac catheterization N/A 07/23/2015    Procedure: Left Heart Cath and Coronary Angiography;  Surgeon: Lorretta Harp, MD;  Location: [facility];  Service: Cardiovascular;  Laterality: N/A;  . Cardiac catheterization N/A 07/23/2015    Procedure: Coronary Balloon Angioplasty;  Surgeon: Lorretta Harp, MD;  Location: [facility];  Service: Cardiovascular;  Laterality: N/A;  . Knee surgery Right 15 years ago    Current Outpatient Rx  Name  Route  Sig  Dispense  Refill  . aspirin 81 MG chewable tablet   Oral   Chew 1 tablet (81 mg total) by mouth  daily.         . atorvastatin (LIPITOR) 80 MG tablet   Oral   Take 1 tablet (80 mg total) by mouth daily at 6 PM.   30 tablet   11   . diphenhydrAMINE (BENADRYL) 25 mg capsule   Oral   Take 1 capsule (25 mg total) by mouth every 6 (six) hours as needed.   30 capsule   0   . hydrOXYzine (VISTARIL) 25 MG capsule   Oral   Take 1 capsule (25 mg total) by  mouth 3 (three) times daily as needed for itching. Take 1-2 tabs by mouth 3 (three) times a day as needed for itching   30 capsule   0   . meloxicam (MOBIC) 15 MG tablet   Oral   Take 15 mg by mouth daily.         . metoprolol tartrate (LOPRESSOR) 25 MG tablet   Oral   Take 1 tablet (25 mg total) by mouth 2 (two) times daily.   60 tablet   5   . naproxen (NAPROSYN) 250 MG tablet   Oral   Take by mouth 2 (two) times daily with a meal.         . nitroGLYCERIN (NITROSTAT) 0.4 MG SL tablet   Sublingual   Place 1 tablet (0.4 mg total) under the tongue every 5 (five) minutes x 3 doses as needed for chest pain.   25 tablet   12   . prasugrel (EFFIENT) 10 MG TABS tablet   Oral   Take 1 tablet (10 mg total) by mouth daily.   90 tablet   9   . ranitidine (ZANTAC) 150 MG tablet   Oral   Take 1 tablet (150 mg total) by mouth 2 (two) times daily.   20 tablet   0     Allergies:  Review of patient's allergies indicates no known allergies.  Family History: No family history on file.  Social History: Social History  Substance Use Topics  . Smoking status: Never Smoker   . Smokeless tobacco: None  . Alcohol Use: No     Review of Systems:   10 point review of systems was performed and was otherwise negative:  Constitutional: No fever Eyes: No visual disturbances ENT: No sore throat, ear pain Cardiac: No chest pain Respiratory: No shortness of breath, wheezing, or stridor Abdomen: No abdominal pain, no vomiting, No diarrhea Endocrine: No weight loss, No night sweats Extremities: No peripheral edema, cyanosis Skin: No rashes, easy bruising Neurologic: No focal weakness, trouble with speech or swollowing Urologic: No dysuria, Hematuria, or urinary frequency   Physical Exam:  ED Triage Vitals  Enc Vitals Group     BP 07/31/15 1743 147/88 mmHg     Pulse Rate 07/31/15 1743 80     Resp 07/31/15 1743 18     Temp 07/31/15 1743 98.2 F (36.8 C)     Temp Source  07/31/15 1743 Oral     SpO2 07/31/15 1743 97 %     Weight 07/31/15 1743 280 lb (127.007 kg)     Height 07/31/15 1743 5\' 6"  (1.676 m)     Head Cir --      Peak Flow --      Pain Score 07/31/15 1749 0     Pain Loc --      Pain Edu? --      Excl. in [place]? --     General: Awake , Alert , and Oriented  times 3; GCS 15 Head: Normal cephalic , atraumatic Eyes: Pupils equal , round, reactive to light Nose/Throat: Patient has swelling in both the upper and lower lips. The appearances of angioedema. Tongue is nonswollen patient has no stridor, normal speech, normal swallowing. Neck: Supple, Full range of motion, No anterior adenopathy or palpable thyroid masses no stridor no edema Lungs: Clear to ascultation without wheezes , rhonchi, or rales Heart: Regular rate, regular rhythm without murmurs , gallops , or rubs Abdomen: Soft, non tender without rebound, guarding , or rigidity; bowel sounds positive and symmetric in all 4 quadrants. No organomegaly .        Extremities: 2 plus symmetric pulses. No edema, clubbing or cyanosis Neurologic: normal ambulation, Motor symmetric without deficits, sensory intact Skin: warm, dry, no rashes   Labs:   All laboratory work was reviewed including any pertinent negatives or positives listed below:  Patient's hemoglobin appears to be a baseline       ED Course: * Patient was observed after receiving Benadryl and IV steroids. Patient has decreasing of her swelling and her swelling seems to be only located in her lips and I felt her airway was stable at this point. The patient's angioedema appears to be from recently prescribed ACE inhibitor. She was advised to stop that medication and to contact her cardiologist for another medication for blood pressure control. She was advised to return here immediately if she has trouble with speech, swallowing, tongue swelling, or any other new concerns. All questions concerns were addressed with her husband and also at  the bedside. Patient was discharged prescriptions and/or instructions for Benadryl and prednisone   Assessment: ACE inhibitor angioedema   Final Clinical Impression:  Final diagnoses:  Angioedema, initial encounter     Plan:  Outpatient management Patient was advised to return immediately if condition worsens. Patient was advised to follow up with their primary care physician or other specialized physicians involved in their outpatient care            Daymon Larsen, MD 08/05/15 [phone]

## 2015-08-25 ENCOUNTER — Ambulatory Visit: Payer: Self-pay

## 2015-09-01 ENCOUNTER — Ambulatory Visit: Payer: Self-pay | Admitting: Internal Medicine

## 2015-09-02 ENCOUNTER — Telehealth: Payer: Self-pay

## 2015-09-02 NOTE — Telephone Encounter (Signed)
Patient needs to reschedule her missed appointment

## 2015-09-08 ENCOUNTER — Ambulatory Visit: Payer: Self-pay | Admitting: Internal Medicine

## 2015-09-08 ENCOUNTER — Emergency Department: Payer: Medicaid Other

## 2015-09-08 ENCOUNTER — Emergency Department
Admission: EM | Admit: 2015-09-08 | Discharge: 2015-09-08 | Disposition: A | Discharge status: Home / Self Care | Payer: Medicaid Other | Attending: Student | Admitting: Student

## 2015-09-08 DIAGNOSIS — Z79899 Other long term (current) drug therapy: Secondary | ICD-10-CM | POA: Insufficient documentation

## 2015-09-08 DIAGNOSIS — R51 Headache: Secondary | ICD-10-CM | POA: Diagnosis present

## 2015-09-08 DIAGNOSIS — I10 Essential (primary) hypertension: Secondary | ICD-10-CM | POA: Diagnosis not present

## 2015-09-08 DIAGNOSIS — H53149 Visual discomfort, unspecified: Secondary | ICD-10-CM | POA: Diagnosis not present

## 2015-09-08 DIAGNOSIS — Z7982 Long term (current) use of aspirin: Secondary | ICD-10-CM | POA: Diagnosis not present

## 2015-09-08 DIAGNOSIS — R519 Headache, unspecified: Secondary | ICD-10-CM

## 2015-09-08 HISTORY — DX: Essential (primary) hypertension: I10

## 2015-09-08 MED ORDER — KETOROLAC TROMETHAMINE 30 MG/ML IJ SOLN
30.0000 mg | Freq: Once | INTRAMUSCULAR | Status: AC
  Administered 2015-09-08: 30 mg via INTRAVENOUS
  Filled 2015-09-08: qty 1

## 2015-09-08 MED ORDER — DIPHENHYDRAMINE HCL 50 MG/ML IJ SOLN
12.5000 mg | Freq: Once | INTRAMUSCULAR | Status: AC
  Administered 2015-09-08: 12.5 mg via INTRAVENOUS
  Filled 2015-09-08: qty 1

## 2015-09-08 MED ORDER — SODIUM CHLORIDE 0.9 % IV BOLUS (SEPSIS)
1000.0000 mL | Freq: Once | INTRAVENOUS | Status: AC
  Administered 2015-09-08: 1000 mL via INTRAVENOUS

## 2015-09-08 MED ORDER — METOCLOPRAMIDE HCL 5 MG/ML IJ SOLN
10.0000 mg | Freq: Once | INTRAMUSCULAR | Status: AC
  Administered 2015-09-08: 10 mg via INTRAVENOUS
  Filled 2015-09-08: qty 2

## 2015-09-08 NOTE — ED Provider Notes (Signed)
[facility] Emergency Department Provider Note  ____________________________________________  Time seen: Approximately 8:30 AM  I have reviewed the triage vital signs and the nursing notes.   HISTORY  Chief Complaint Headache    HPI Sarah Medina is a 50 y.o. female history of coronary artery disease on Effient, hypertension, hyperlipidemia who presents for evaluation of gradual onset frontal headache and face pain which began yesterday evening, constant since onset, currently moderate, associated with photophobia and worse with bright light. No vision change, no phonophobia, no nausea, vomiting, diarrhea, fevers or chills. No head trauma. No numbness or weakness in the arms or legs. She has not taken any medications at home to try and treat the headache.   Past Medical History  Diagnosis Date  . Hypercholesteremia   . H/O myocardial infarction less than 8 weeks- STEMI inf wall 07/20/15- stent to RCA 07/20/15    inf wall STEMI with DES to RCA  . CAD in native artery, RCA, LAD and LCX 07/23/2015  . STEMI (ST elevation myocardial infarction) ([place]) 07/23/15  . Periorbital edema     treated with steroids  . Hypertension     Patient Active Problem List   Diagnosis Date Noted  . Acute posthemorrhagic anemia   . H/O myocardial infarction less than 8 weeks- STEMI inf wall 07/20/15- stent to RCA 07/23/2015  . Hyperlipidemia LDL goal <70 07/23/2015  . CAD in native artery, RCA, LAD and LCX 07/23/2015  . Ventricular fibrillation ([place]) 07/23/2015  . ST elevation myocardial infarction (STEMI) of inferior wall ([place]) 07/20/2015  . Abnormal LFTs 12/23/2014  . Osteoarthritis of right knee 11/24/2014    Past Surgical History  Procedure Laterality Date  . Cardiac catheterization N/A 07/20/2015    Procedure: Left Heart Cath and Coronary Angiography;  Surgeon: Isaias Cowman, MD;  Location: [facility];  Service: Cardiovascular;  Laterality: N/A;  .  Cardiac catheterization N/A 07/20/2015    Procedure: Coronary Stent Intervention;  Surgeon: Isaias Cowman, MD;  Location: [facility];  Service: Cardiovascular;  Laterality: N/A;  . Cardiac catheterization N/A 07/23/2015    Procedure: Left Heart Cath and Coronary Angiography;  Surgeon: Lorretta Harp, MD;  Location: [facility];  Service: Cardiovascular;  Laterality: N/A;  . Cardiac catheterization N/A 07/23/2015    Procedure: Coronary Balloon Angioplasty;  Surgeon: Lorretta Harp, MD;  Location: [facility];  Service: Cardiovascular;  Laterality: N/A;  . Knee surgery Right 15 years ago    Current Outpatient Rx  Name  Route  Sig  Dispense  Refill  . aspirin EC 81 MG tablet   Oral   Take 81 mg by mouth daily.         Marland Kitchen atorvastatin (LIPITOR) 80 MG tablet   Oral   Take 1 tablet (80 mg total) by mouth daily at 6 PM. Patient taking differently: Take 40 mg by mouth daily at 6 PM.    30 tablet   11   . diphenhydrAMINE (BENADRYL) 25 mg capsule   Oral   Take 1 capsule (25 mg total) by mouth every 6 (six) hours as needed.   30 capsule   0   . hydrOXYzine (VISTARIL) 25 MG capsule   Oral   Take 1 capsule (25 mg total) by mouth 3 (three) times daily as needed for itching. Take 1-2 tabs by mouth 3 (three) times a day as needed for itching   30 capsule   0   . metoprolol  tartrate (LOPRESSOR) 25 MG tablet   Oral   Take 1 tablet (25 mg total) by mouth 2 (two) times daily.   60 tablet   5   . nitroGLYCERIN (NITROSTAT) 0.4 MG SL tablet   Sublingual   Place 1 tablet (0.4 mg total) under the tongue every 5 (five) minutes x 3 doses as needed for chest pain.   25 tablet   12   . prasugrel (EFFIENT) 10 MG TABS tablet   Oral   Take 1 tablet (10 mg total) by mouth daily.   90 tablet   9   . ranitidine (ZANTAC) 150 MG tablet   Oral   Take 1 tablet (150 mg total) by mouth 2 (two) times daily.   20 tablet   0     Allergies Review of patient's allergies  indicates no known allergies.  No family history on file.  Social History Social History  Substance Use Topics  . Smoking status: Never Smoker   . Smokeless tobacco: None  . Alcohol Use: No    Review of Systems Constitutional: No fever/chills Eyes: No visual changes. ENT: No sore throat. Cardiovascular: Denies chest pain. Respiratory: Denies shortness of breath. Gastrointestinal: No abdominal pain.  No nausea, no vomiting.  No diarrhea.  No constipation. Genitourinary: Negative for dysuria. Musculoskeletal: Negative for back pain. Skin: Negative for rash. Neurological: Positive for headache, no focal weakness or numbness.  10-point ROS otherwise negative.  ____________________________________________   PHYSICAL EXAM:  VITAL SIGNS: ED Triage Vitals  Enc Vitals Group     BP 09/08/15 0825 156/80 mmHg     Pulse Rate 09/08/15 0825 87     Resp 09/08/15 0825 20     Temp 09/08/15 0825 99.5 F (37.5 C)     Temp Source 09/08/15 0825 Oral     SpO2 09/08/15 0825 96 %     Weight 09/08/15 0825 279 lb (126.554 kg)     Height 09/08/15 0825 5\' 6"  (1.676 m)     Head Cir --      Peak Flow --      Pain Score 09/08/15 0826 9     Pain Loc --      Pain Edu? --      Excl. in [place]? --     Constitutional: Alert and oriented. Appears to be in mild distress due to pain with facial grimace. Eyes: Conjunctivae are normal. PERRL. EOMI. + photophobia Head: Atraumatic. Nose: No congestion/rhinnorhea. Mouth/Throat: Mucous membranes are moist.  Oropharynx non-erythematous. Neck: No stridor. Supple without meningismus. Cardiovascular: Normal rate, regular rhythm. Grossly normal heart sounds.  Good peripheral circulation. Respiratory: Normal respiratory effort.  No retractions. Lungs CTAB. Gastrointestinal: Soft and nontender. No distention. No CVA tenderness. Genitourinary: deferred Musculoskeletal: No lower extremity tenderness nor edema.  No joint effusions. Neurologic:  Normal speech and  language. No gross focal neurologic deficits are appreciated. 5 out of 5 strength in bilateral upper and lower extremities, sensation intact to light touch throughout, cranial nerves II through XII intact. Skin:  Skin is warm, dry and intact. No rash noted. Psychiatric: Mood and affect are normal. Speech and behavior are normal.  ____________________________________________   LABS (all labs ordered are listed, but only abnormal results are displayed)  Labs Reviewed - No data to display ____________________________________________  EKG  none ____________________________________________  RADIOLOGY  CT head IMPRESSION: No acute intracranial abnormality noted. ____________________________________________   PROCEDURES  Procedure(s) performed: None  Critical Care performed: No  ____________________________________________   INITIAL IMPRESSION /  ASSESSMENT AND PLAN / ED COURSE  Pertinent labs & imaging results that were available during my care of the patient were reviewed by me and considered in my medical decision making (see chart for details).  Sarah Medina is a 50 y.o. female history of coronary artery disease on Effient, hypertension, hyperlipidemia who presents for evaluation of gradual onset frontal headache and face pain which began yesterday evening. On exam, she appears to be in mild distress due to pain, grimacing often. She does have significant photophobia. Her neck is supple without meningismus and I doubt meningitis in this patient. She is afebrile. I'll signs are stable. She has an intact neurological examination. We'll obtain CT head and treat with migraine cocktail. Reassess.  ----------------------------------------- 10:49 AM on 09/08/2015 -----------------------------------------  She reports complete resolution of her headache at this time after Reglan, Toradol, Benadryl, fluids. She reports headache 0 out of 10 on the pain scale. Photophobia has  resolved. CT head negative. Nonspecific headache. Doubt meningitis or subarachnoid hemorrhage. We'll DC with return precautions and close PCP follow-up she is comfortable with the discharge plan. ____________________________________________   FINAL CLINICAL IMPRESSION(S) / ED DIAGNOSES  Final diagnoses:  Acute nonintractable headache, unspecified headache type      Joanne Gavel, MD 09/08/15 1050

## 2015-09-08 NOTE — ED Notes (Signed)
Pt c/o headache with photophobia that started last night.. Denies N/V/dizziness or visual changes.. Pt states she has not taken anything to try and relieve her pain.

## 2015-10-06 ENCOUNTER — Ambulatory Visit: Payer: Self-pay

## 2015-10-12 ENCOUNTER — Telehealth: Payer: Self-pay

## 2015-10-12 NOTE — Telephone Encounter (Signed)
Called patient, she scheduled an appointment for April 19 at 10:30 am with Dr. Mable Fill

## 2015-10-12 NOTE — Telephone Encounter (Signed)
Pt lm , needs to schedule an appointment, di not give any more info [phone]

## 2015-10-20 ENCOUNTER — Ambulatory Visit: Payer: Self-pay | Admitting: Internal Medicine

## 2015-10-20 ENCOUNTER — Encounter: Payer: Self-pay | Admitting: Internal Medicine

## 2015-10-20 VITALS — BP 122/72 | HR 61 | Temp 98.3°F | Wt 268.0 lb

## 2015-10-20 DIAGNOSIS — E785 Hyperlipidemia, unspecified: Secondary | ICD-10-CM | POA: Insufficient documentation

## 2015-10-20 DIAGNOSIS — I10 Essential (primary) hypertension: Secondary | ICD-10-CM | POA: Insufficient documentation

## 2015-10-20 DIAGNOSIS — I259 Chronic ischemic heart disease, unspecified: Secondary | ICD-10-CM | POA: Insufficient documentation

## 2015-10-20 NOTE — Progress Notes (Addendum)
Subjective:    Patient ID: Sarah Medina, female    DOB: December 11, 1965, 50 y.o.   MRN: [id number]  HPI  Patient Active Problem List   Diagnosis Date Noted  . Acute posthemorrhagic anemia   . H/O myocardial infarction less than 8 weeks- STEMI inf wall 07/20/15- stent to RCA 07/23/2015  . Hyperlipidemia LDL goal <70 07/23/2015  . CAD in native artery, RCA, LAD and LCX 07/23/2015  . Ventricular fibrillation ([place]) 07/23/2015  . ST elevation myocardial infarction (STEMI) of inferior wall ([place]) 07/20/2015  . Abnormal LFTs 12/23/2014  . Osteoarthritis of right knee 11/24/2014   Pt was diagnosed with ischemic heart disease prior to this appt. Pt had a heart attack in January. Arthritis in knees and arms got better after her heart attack. Going back to cardiac doctor in June. Pt had XIENCE stents put in her heart, unsure of number and location.   ED Discharge summary: Inferior MI with ventricular fibrillation arrest in January 17,2017. Stent placed in the right coronary. Has second occurrence on the July 23, 2015 and had a catheterization. Reopening of Left coronary stent.   Residual 80% proximal LAD. Needs to be followed up by cardiologist.  Residual left ventricular function is 65%.   Review of Systems     Objective:   Physical Exam  Constitutional: She is oriented to person, place, and time.  Cardiovascular: Normal rate, regular rhythm and normal heart sounds.   Pulmonary/Chest: Effort normal and breath sounds normal.  Neurological: She is alert and oriented to person, place, and time.      Medication List       This list is accurate as of: 10/20/15 10:57 AM.  Always use your most recent med list.               aspirin EC 81 MG tablet  Take 81 mg by mouth daily.     atorvastatin 80 MG tablet  Commonly known as:  LIPITOR  Take 1 tablet (80 mg total) by mouth daily at 6 PM.     diphenhydrAMINE 25 mg capsule  Commonly known as:  BENADRYL  Take 1 capsule (25 mg  total) by mouth every 6 (six) hours as needed.     hydrOXYzine 25 MG capsule  Commonly known as:  VISTARIL  Take 1 capsule (25 mg total) by mouth 3 (three) times daily as needed for itching. Take 1-2 tabs by mouth 3 (three) times a day as needed for itching     metoprolol tartrate 25 MG tablet  Commonly known as:  LOPRESSOR  Take 1 tablet (25 mg total) by mouth 2 (two) times daily.     nitroGLYCERIN 0.4 MG SL tablet  Commonly known as:  NITROSTAT  Place 1 tablet (0.4 mg total) under the tongue every 5 (five) minutes x 3 doses as needed for chest pain.     prasugrel 10 MG Tabs tablet  Commonly known as:  EFFIENT  Take 1 tablet (10 mg total) by mouth daily.     ranitidine 150 MG tablet  Commonly known as:  ZANTAC  Take 1 tablet (150 mg total) by mouth 2 (two) times daily.       BP 122/72 mmHg  Pulse 61  Temp(Src) 98.3 F (36.8 C)  Wt 268 lb (121.564 kg)  LMP 05/19/2015 (LMP Unknown)     Assessment & Plan:   Sarah Medina was seen today for follow-up.  Diagnoses and all orders for this visit:  Ischemic heart disease  Essential hypertension -     CBC w/Diff; Future -     Comp Met (CMET); Future  Hyperlipidemia -     Lipid panel; Future   Referred to [facility] for mammogram and pap smear MD fu in 90 days CBC, met c, lipid panel

## 2015-11-03 ENCOUNTER — Ambulatory Visit
Admission: RE | Admit: 2015-11-03 | Discharge: 2015-11-03 | Disposition: A | Discharge status: Home / Self Care | Payer: Self-pay | Source: Ambulatory Visit | Attending: Oncology | Admitting: Oncology

## 2015-11-03 ENCOUNTER — Encounter: Payer: Self-pay | Admitting: *Deleted

## 2015-11-03 ENCOUNTER — Ambulatory Visit: Payer: MEDICAID | Attending: Oncology | Admitting: *Deleted

## 2015-11-03 VITALS — BP 152/97 | HR 57 | Temp 98.0°F | Resp 16 | Ht 66.93 in | Wt 275.6 lb

## 2015-11-03 DIAGNOSIS — Z0000 Encounter for general adult medical examination without abnormal findings: Secondary | ICD-10-CM

## 2015-11-03 NOTE — Progress Notes (Signed)
Subjective:     Patient ID: Sarah Medina, female   DOB: January 05, 1966, 50 y.o.   MRN: [id number]  HPI   Review of Systems     Objective:   Physical Exam  Pulmonary/Chest: Right breast exhibits no inverted nipple, no mass, no nipple discharge, no skin change and no tenderness. Left breast exhibits no inverted nipple, no mass, no nipple discharge, no skin change and no tenderness. Breasts are symmetrical.  Abdominal: There is no splenomegaly or hepatomegaly.  Genitourinary: No breast swelling or discharge. No labial fusion. There is no rash, tenderness, lesion or injury on the right labia. There is no rash, tenderness, lesion or injury on the left labia. Uterus is not deviated, not enlarged and not tender. Cervix exhibits discharge. Cervix exhibits no motion tenderness and no friability. Right adnexum displays no mass, no tenderness and no fullness. Left adnexum displays no mass and no tenderness. No erythema, tenderness or bleeding in the vagina. No foreign body around the vagina. No signs of injury around the vagina. No vaginal discharge found.  Tan mucus discharge from the cervical os       Assessment:     50 year old Black female presents to [facility] for clinical breast exam, pap and mammogram.  Clinical breast exam unremarkable.  Taught self breast awareness.  Specimen collected for pap smear without difficulty. Blood pressure elevated at 152/97  She is to recheck her blood pressure at [facility] or [facility], and if remains higher than 140/90 she is to follow-up with her primary care provider.  Hand out on hypertention given to patient. Patient has been screened for eligibility.  She does not have any insurance, Medicare or Medicaid.  She also meets financial eligibility.  Hand-out given on the Affordable Care Act.    Plan:     Screening mammogram ordered.  Specimen sent to the lab.  Will follow-up per [facility] protocol.

## 2015-11-03 NOTE — Patient Instructions (Signed)
Gave patient hand-out, Women Staying Healthy, Active and Well from [facility], with education on breast health, pap smears, heart and colon health. 

## 2015-11-08 LAB — PAP LB AND HPV HIGH-RISK
HPV, HIGH-RISK: NEGATIVE
PAP Smear Comment: 0

## 2015-11-11 ENCOUNTER — Encounter: Payer: Self-pay | Admitting: *Deleted

## 2015-11-11 NOTE — Progress Notes (Signed)
Called patient and informed her of her normal mammogram and pap smear.  She denies any sign or symptom of Candida.  Encouraged to try OTC treatment for yeast infection if she becomes symptomatic, otherwise she does not need any treatment.  She is to follow up in one year with annual screening.  Next pap due in 5 years.  HSIS to [place].

## 2015-12-30 ENCOUNTER — Other Ambulatory Visit: Payer: Self-pay | Admitting: General Practice

## 2015-12-30 DIAGNOSIS — R079 Chest pain, unspecified: Secondary | ICD-10-CM

## 2015-12-30 DIAGNOSIS — R069 Unspecified abnormalities of breathing: Secondary | ICD-10-CM

## 2016-01-12 ENCOUNTER — Ambulatory Visit: Payer: Self-pay | Admitting: Ophthalmology

## 2016-01-13 ENCOUNTER — Ambulatory Visit: Payer: Disability Insurance | Attending: General Practice

## 2016-01-13 DIAGNOSIS — R069 Unspecified abnormalities of breathing: Secondary | ICD-10-CM

## 2016-01-13 DIAGNOSIS — J449 Chronic obstructive pulmonary disease, unspecified: Secondary | ICD-10-CM | POA: Insufficient documentation

## 2016-01-13 DIAGNOSIS — R079 Chest pain, unspecified: Secondary | ICD-10-CM | POA: Diagnosis present

## 2016-01-13 MED ORDER — ALBUTEROL SULFATE (2.5 MG/3ML) 0.083% IN NEBU
2.5000 mg | INHALATION_SOLUTION | Freq: Once | RESPIRATORY_TRACT | Status: AC
  Administered 2016-01-13: 2.5 mg via RESPIRATORY_TRACT
  Filled 2016-01-13: qty 3

## 2016-01-19 ENCOUNTER — Other Ambulatory Visit: Payer: Self-pay

## 2016-01-26 ENCOUNTER — Encounter: Payer: Self-pay | Admitting: Internal Medicine

## 2016-01-26 ENCOUNTER — Ambulatory Visit: Payer: Self-pay | Admitting: Internal Medicine

## 2016-01-26 VITALS — BP 113/75 | HR 57 | Temp 97.9°F | Wt 253.0 lb

## 2016-01-26 DIAGNOSIS — I10 Essential (primary) hypertension: Secondary | ICD-10-CM

## 2016-01-26 MED ORDER — RANITIDINE HCL 150 MG PO TABS: 150.0000 mg | ORAL_TABLET | Freq: Two times a day (BID) | ORAL | 2 refills | Status: DC

## 2016-01-26 MED ORDER — METOPROLOL TARTRATE 25 MG PO TABS: 25.0000 mg | ORAL_TABLET | Freq: Two times a day (BID) | ORAL | 5 refills | Status: DC

## 2016-01-26 MED ORDER — CLOPIDOGREL BISULFATE 75 MG PO TABS: 75.0000 mg | ORAL_TABLET | Freq: Every day | ORAL | 3 refills | Status: DC

## 2016-01-26 NOTE — Patient Instructions (Signed)
Patient to follow up in 3 months Labs prior to appt Metc, CBC, Lipids, TSH, UA and A1c

## 2016-01-26 NOTE — Progress Notes (Signed)
   Subjective:    Patient ID: Sarah Medina, female    DOB: 1965-08-31, 50 y.o.   MRN: [id number]  HPI Patient Active Problem List   Diagnosis Date Noted  . Ischemic heart disease 10/20/2015  . Essential hypertension 10/20/2015  . Hyperlipidemia 10/20/2015  . Acute posthemorrhagic anemia   . H/O myocardial infarction less than 8 weeks- STEMI inf wall 07/20/15- stent to RCA 07/23/2015  . CAD in native artery, RCA, LAD and LCX 07/23/2015  . Ventricular fibrillation ([place]) 07/23/2015  . ST elevation myocardial infarction (STEMI) of inferior wall ([place]) 07/20/2015  . Abnormal LFTs 12/23/2014  . Osteoarthritis of right knee 11/24/2014   Follow up appointment  Review of Systems Blood pressure is good.      Objective:   Physical Exam  Constitutional: She is oriented to person, place, and time.  Cardiovascular: Normal rate, regular rhythm and normal heart sounds.   Pulmonary/Chest: Effort normal and breath sounds normal.  Neurological: She is alert and oriented to person, place, and time.     Medication List       Accurate as of 01/26/16 10:16 AM. Always use your most recent med list.          aspirin EC 81 MG tablet Take 81 mg by mouth daily.   atorvastatin 80 MG tablet Commonly known as:  LIPITOR Take 1 tablet (80 mg total) by mouth daily at 6 PM.   clopidogrel 75 MG tablet Commonly known as:  PLAVIX Take 75 mg by mouth daily.   diphenhydrAMINE 25 mg capsule Commonly known as:  BENADRYL Take 1 capsule (25 mg total) by mouth every 6 (six) hours as needed.   hydrOXYzine 25 MG capsule Commonly known as:  VISTARIL Take 1 capsule (25 mg total) by mouth 3 (three) times daily as needed for itching. Take 1-2 tabs by mouth 3 (three) times a day as needed for itching   metoprolol tartrate 25 MG tablet Commonly known as:  LOPRESSOR Take 1 tablet (25 mg total) by mouth 2 (two) times daily.   nitroGLYCERIN 0.4 MG SL tablet Commonly known as:  NITROSTAT Place 1 tablet  (0.4 mg total) under the tongue every 5 (five) minutes x 3 doses as needed for chest pain.   prasugrel 10 MG Tabs tablet Commonly known as:  EFFIENT Take 1 tablet (10 mg total) by mouth daily.      BP 113/75   Pulse (!) 57   Temp 97.9 F (36.6 C)   Wt 253 lb (114.8 kg)   LMP 05/19/2015 (LMP Unknown)   BMI 39.71 kg/m         Assessment & Plan:  Patient to follow up in 3 months Labs prior to appt Metc, CBC, Lipids, TSH, UA and A1c

## 2016-01-27 NOTE — Addendum Note (Signed)
Addended by: Inis Sizer on: 01/27/2016 05:41 PM   Modules accepted: Orders

## 2016-03-31 ENCOUNTER — Emergency Department
Admission: EM | Admit: 2016-03-31 | Discharge: 2016-03-31 | Disposition: A | Discharge status: Home / Self Care | Payer: Medicaid Other | Attending: Emergency Medicine | Admitting: Emergency Medicine

## 2016-03-31 ENCOUNTER — Encounter: Payer: Self-pay | Admitting: Medical Oncology

## 2016-03-31 DIAGNOSIS — Z7982 Long term (current) use of aspirin: Secondary | ICD-10-CM | POA: Insufficient documentation

## 2016-03-31 DIAGNOSIS — L243 Irritant contact dermatitis due to cosmetics: Secondary | ICD-10-CM | POA: Diagnosis not present

## 2016-03-31 DIAGNOSIS — Z79899 Other long term (current) drug therapy: Secondary | ICD-10-CM | POA: Diagnosis not present

## 2016-03-31 DIAGNOSIS — B372 Candidiasis of skin and nail: Secondary | ICD-10-CM

## 2016-03-31 DIAGNOSIS — L309 Dermatitis, unspecified: Secondary | ICD-10-CM

## 2016-03-31 DIAGNOSIS — R21 Rash and other nonspecific skin eruption: Secondary | ICD-10-CM | POA: Diagnosis present

## 2016-03-31 DIAGNOSIS — I2510 Atherosclerotic heart disease of native coronary artery without angina pectoris: Secondary | ICD-10-CM | POA: Diagnosis not present

## 2016-03-31 DIAGNOSIS — I10 Essential (primary) hypertension: Secondary | ICD-10-CM | POA: Insufficient documentation

## 2016-03-31 DIAGNOSIS — L210 Seborrhea capitis: Secondary | ICD-10-CM

## 2016-03-31 MED ORDER — CLOTRIMAZOLE-BETAMETHASONE 1-0.05 % EX CREA: 1.0000 "application " | TOPICAL_CREAM | Freq: Two times a day (BID) | CUTANEOUS | 1 refills | Status: DC

## 2016-03-31 NOTE — ED Notes (Signed)
C/O breaking out in a rash behind left ear and under both breasts.  Small area of raised bumps seen to left head behind ear.  Dark areas of discoloration seen bilaterally under breasts.  Skin is intact.

## 2016-03-31 NOTE — ED Provider Notes (Signed)
[facility] Emergency Department Provider Note ____________________________________________  Time seen: 1133  I have reviewed the triage vital signs and the nursing notes.  HISTORY  Chief Complaint  Rash  HPI Sarah Medina is a 50 y.o. female presents to the ED for evaluation of a rash that she is noted on the neck and area behind the ears as well as under both breasts. She also has a similar lesion to the dorsal aspect of her left arm. The patient describes the rash in all areas as being extremely itchy in nature. She has not tried any over-the-counter creams orseen an provider for this rash. She notes the rash has been present for more than 2 weeks. She admits to the rash around her nape and ear as being present since she applied a home hair relaxer treatment over a month ago. She denies any interim fevers, chills, or sweats. She denies any contacts, exposures, or allergies.   Past Medical History:  Diagnosis Date  . CAD in native artery, RCA, LAD and LCX 07/23/2015  . H/O myocardial infarction less than 8 weeks- STEMI inf wall 07/20/15- stent to RCA 07/20/15   inf wall STEMI with DES to RCA  . Hypercholesteremia   . Hypertension   . Periorbital edema    treated with steroids  . STEMI (ST elevation myocardial infarction) ([place]) 07/23/15    Patient Active Problem List   Diagnosis Date Noted  . Ischemic heart disease 10/20/2015  . Essential hypertension 10/20/2015  . Hyperlipidemia 10/20/2015  . Acute posthemorrhagic anemia   . H/O myocardial infarction less than 8 weeks- STEMI inf wall 07/20/15- stent to RCA 07/23/2015  . CAD in native artery, RCA, LAD and LCX 07/23/2015  . Ventricular fibrillation ([place]) 07/23/2015  . ST elevation myocardial infarction (STEMI) of inferior wall ([place]) 07/20/2015  . Abnormal LFTs 12/23/2014  . Osteoarthritis of right knee 11/24/2014    Past Surgical History:  Procedure Laterality Date  . CARDIAC CATHETERIZATION N/A  07/20/2015   Procedure: Left Heart Cath and Coronary Angiography;  Surgeon: Isaias Cowman, MD;  Location: [place] [facility];  Service: Cardiovascular;  Laterality: N/A;  . CARDIAC CATHETERIZATION N/A 07/20/2015   Procedure: Coronary Stent Intervention;  Surgeon: Isaias Cowman, MD;  Location: [place] [facility];  Service: Cardiovascular;  Laterality: N/A;  . CARDIAC CATHETERIZATION N/A 07/23/2015   Procedure: Left Heart Cath and Coronary Angiography;  Surgeon: Lorretta Harp, MD;  Location: [place] [facility];  Service: Cardiovascular;  Laterality: N/A;  . CARDIAC CATHETERIZATION N/A 07/23/2015   Procedure: Coronary Balloon Angioplasty;  Surgeon: Lorretta Harp, MD;  Location: [place] [facility];  Service: Cardiovascular;  Laterality: N/A;  . KNEE SURGERY Right 15 years ago    Prior to Admission medications   Medication Sig Start Date End Date Taking? Authorizing Provider  aspirin EC 81 MG tablet Take 81 mg by mouth daily.    Historical Provider, MD  atorvastatin (LIPITOR) 80 MG tablet Take 1 tablet (80 mg total) by mouth daily at 6 PM. Patient taking differently: Take 40 mg by mouth daily at 6 PM.  07/27/15   Eileen Stanford, PA-C  clopidogrel (PLAVIX) 75 MG tablet Take 1 tablet (75 mg total) by mouth daily. 01/26/16   Tawni Millers, MD  clotrimazole-betamethasone (LOTRISONE) cream Apply 1 application topically 2 (two) times daily. 03/31/16   Kaelen Caughlin V Bacon Darik Massing, PA-C  diphenhydrAMINE (BENADRYL) 25 mg capsule Take 1 capsule (25 mg total) by mouth every 6 (  six) hours as needed. 07/22/15   Henreitta Leber, MD  hydrOXYzine (VISTARIL) 25 MG capsule Take 1 capsule (25 mg total) by mouth 3 (three) times daily as needed for itching. Take 1-2 tabs by mouth 3 (three) times a day as needed for itching Patient not taking: Reported on 01/26/2016 06/30/15   Dannielle Karvonen Susette Seminara, PA-C  metoprolol tartrate (LOPRESSOR) 25 MG tablet Take 1 tablet (25 mg total) by mouth 2 (two) times daily.  01/26/16   Tawni Millers, MD  nitroGLYCERIN (NITROSTAT) 0.4 MG SL tablet Place 1 tablet (0.4 mg total) under the tongue every 5 (five) minutes x 3 doses as needed for chest pain. 07/27/15   Eileen Stanford, PA-C  prasugrel (EFFIENT) 10 MG TABS tablet Take 1 tablet (10 mg total) by mouth daily. Patient not taking: Reported on 01/26/2016 07/27/15   Eileen Stanford, PA-C  ranitidine (ZANTAC) 150 MG tablet Take 1 tablet (150 mg total) by mouth 2 (two) times daily. 01/26/16   Tawni Millers, MD   Allergies Review of patient's allergies indicates no known allergies.  Family History  Problem Relation Age of Onset  . Breast cancer Sister 7    Social History Social History  Substance Use Topics  . Smoking status: Never Smoker  . Smokeless tobacco: Never Used  . Alcohol use No   Review of Systems  Constitutional: Negative for fever. Cardiovascular: Negative for chest pain. Respiratory: Negative for shortness of breath. Skin: Positive for rash. Neurological: Negative for headaches, focal weakness or numbness. ____________________________________________  PHYSICAL EXAM:  VITAL SIGNS: ED Triage Vitals  Enc Vitals Group     BP 03/31/16 1015 (!) 143/109     Pulse Rate 03/31/16 1014 60     Resp 03/31/16 1014 18     Temp 03/31/16 1014 97 F (36.1 C)     Temp Source 03/31/16 1014 Oral     SpO2 03/31/16 1014 97 %     Weight 03/31/16 1015 250 lb (113.4 kg)     Height 03/31/16 1015 5\' 6"  (1.676 m)     Head Circumference --      Peak Flow --      Pain Score 03/31/16 1217 0     Pain Loc --      Pain Edu? --      Excl. in [place]? --    Constitutional: Alert and oriented. Well appearing and in no distress. Head: Normocephalic and atraumatic. Respiratory: Normal respiratory effort.  Musculoskeletal: Nontender with normal range of motion in all extremities.  Skin:  Skin is warm, dry and intact. Patient with a distinct area of macular rash to the volar aspect of the left forearm. There is  hyperpigmented and skin and excoriations are noted. The intertriginous skin under the breasts has a rough, hypertrophic texture and is hyperpigmented. Additionally, the skin around the hairline, ears, and nape appears inflammed, hypertrophic, and scaly.  ___________________________________________  INITIAL IMPRESSION / ASSESSMENT AND PLAN / ED COURSE  Patient with what appears to be a yeast dermatitis to the intertriginous skin of the breast. She also has what appears to be eczema flare to the left forearm. She also appears to have a irritant contact dermatitis around the hairline ears and nape secondary to chemical hair relaxer. Patient is discharged with a prescription for Lotrisone to apply topically to the areas. She is also advised to dose over-the-counter Benadryl for histamine blockade. She is further advised to avoid any application of cosmetic or chemical relaxes to  the hair. She is also advised she may use over-the-counter T/Gel shampoo or selenium sulfide shampoo for dandruff relief. She will follow with her primary care provider for ongoing symptom management.  Clinical Course   ____________________________________________  FINAL CLINICAL IMPRESSION(S) / ED DIAGNOSES  Final diagnoses:  Eczema  Yeast dermatitis  Irritant contact dermatitis due to cosmetics  Dandruff      Melvenia Needles, PA-C 03/31/16 Hallowell Quigley, MD 04/01/16 [id number]

## 2016-03-31 NOTE — ED Notes (Signed)
Patient denies pain and is resting comfortably.  

## 2016-03-31 NOTE — ED Triage Notes (Signed)
Pt reports over the last 2 weeks she has had a rash to the left side of her neck and under her left breast. Pt reports rash is itchy. Denies sob. NAD noted.

## 2016-03-31 NOTE — ED Notes (Signed)
AAOx3.  Skin warm and dry.  NAD 

## 2016-03-31 NOTE — Discharge Instructions (Signed)
You appear to have a yeast infection of the skin causing itching and irritation of the skin under the breast. You also have what appears to be a similar rash the the arm. It may also be a mild eczema flare. The scalp and skin around your neck seems to be inflammed by use of a curl relaxer on your hair. You should use the prescription cream on the rashes daily as directed. You should also wash your hair weekly with OTC T-Gel Shampoo or Nizoral Shampoo for scalp dermatitis and dandruff. Follow-up your provider at [facility] in 1 week for recheck. Avoid using hair products to relax your hair until symptoms are completely gone.

## 2016-04-07 ENCOUNTER — Encounter: Payer: Self-pay | Admitting: *Deleted

## 2016-04-07 ENCOUNTER — Ambulatory Visit: Payer: Self-pay | Admitting: Family Medicine

## 2016-04-18 ENCOUNTER — Other Ambulatory Visit: Payer: Self-pay | Admitting: Internal Medicine

## 2016-04-18 ENCOUNTER — Telehealth: Payer: Self-pay

## 2016-04-18 NOTE — Telephone Encounter (Signed)
Pt called in wanting a refill on metoprolol-bp med. The computer shows she has some refills available. Medication management told pt she did not have any. I call medication management and left message to find out why there is no refills. Waiting on response.

## 2016-04-26 ENCOUNTER — Other Ambulatory Visit: Payer: Self-pay

## 2016-05-03 ENCOUNTER — Ambulatory Visit: Payer: Self-pay | Admitting: Ophthalmology

## 2016-05-03 ENCOUNTER — Ambulatory Visit: Payer: Self-pay | Admitting: Internal Medicine

## 2016-05-16 ENCOUNTER — Encounter: Payer: Self-pay | Admitting: *Deleted

## 2016-05-16 ENCOUNTER — Emergency Department
Admission: EM | Admit: 2016-05-16 | Discharge: 2016-05-16 | Disposition: A | Discharge status: Home / Self Care | Payer: Medicaid Other | Attending: Emergency Medicine | Admitting: Emergency Medicine

## 2016-05-16 ENCOUNTER — Emergency Department: Payer: Medicaid Other

## 2016-05-16 DIAGNOSIS — Y939 Activity, unspecified: Secondary | ICD-10-CM | POA: Diagnosis not present

## 2016-05-16 DIAGNOSIS — M25561 Pain in right knee: Secondary | ICD-10-CM | POA: Insufficient documentation

## 2016-05-16 DIAGNOSIS — Y999 Unspecified external cause status: Secondary | ICD-10-CM | POA: Diagnosis not present

## 2016-05-16 DIAGNOSIS — Z7982 Long term (current) use of aspirin: Secondary | ICD-10-CM | POA: Diagnosis not present

## 2016-05-16 DIAGNOSIS — Y9222 Religious institution as the place of occurrence of the external cause: Secondary | ICD-10-CM | POA: Diagnosis not present

## 2016-05-16 DIAGNOSIS — I2510 Atherosclerotic heart disease of native coronary artery without angina pectoris: Secondary | ICD-10-CM | POA: Insufficient documentation

## 2016-05-16 DIAGNOSIS — I10 Essential (primary) hypertension: Secondary | ICD-10-CM | POA: Insufficient documentation

## 2016-05-16 DIAGNOSIS — Z79899 Other long term (current) drug therapy: Secondary | ICD-10-CM | POA: Insufficient documentation

## 2016-05-16 DIAGNOSIS — I252 Old myocardial infarction: Secondary | ICD-10-CM | POA: Insufficient documentation

## 2016-05-16 DIAGNOSIS — W19XXXA Unspecified fall, initial encounter: Secondary | ICD-10-CM | POA: Diagnosis not present

## 2016-05-16 DIAGNOSIS — S8991XA Unspecified injury of right lower leg, initial encounter: Secondary | ICD-10-CM | POA: Diagnosis present

## 2016-05-16 MED ORDER — MELOXICAM 7.5 MG PO TABS: 7.5000 mg | ORAL_TABLET | Freq: Every day | ORAL | 0 refills | Status: AC

## 2016-05-16 NOTE — ED Provider Notes (Signed)
[facility] Emergency Department Provider Note ____________________________________________  Time seen: Approximately 3:10 PM  I have reviewed the triage vital signs and the nursing notes.   HISTORY  Chief Complaint Knee Injury    HPI Sarah Medina is a 51 y.o. female presenting with right knee pain after a fall at church on Sunday. Patient's right knee pain is localized to the lateral aspect of the joint and occasionally radiates to the ankle. Patient states that she had a prior arthroscopy on right knee approximately 9-10 years ago. Patient states that right knee was not hurting before her fall. She has been applying icy hot cream to the right knee which has not relieved her symptoms. Right knee pain is exacerbated by walking. Patient ambulates at baseline. She denies fever.  Past Medical History:  Diagnosis Date  . CAD in native artery, RCA, LAD and LCX 07/23/2015  . H/O myocardial infarction less than 8 weeks- STEMI inf wall 07/20/15- stent to RCA 07/20/15   inf wall STEMI with DES to RCA  . Hypercholesteremia   . Hypertension   . Periorbital edema    treated with steroids  . STEMI (ST elevation myocardial infarction) ([place]) 07/23/15    Patient Active Problem List   Diagnosis Date Noted  . Ischemic heart disease 10/20/2015  . Essential hypertension 10/20/2015  . Hyperlipidemia 10/20/2015  . Acute posthemorrhagic anemia   . H/O myocardial infarction less than 8 weeks- STEMI inf wall 07/20/15- stent to RCA 07/23/2015  . CAD in native artery, RCA, LAD and LCX 07/23/2015  . Ventricular fibrillation ([place]) 07/23/2015  . ST elevation myocardial infarction (STEMI) of inferior wall ([place]) 07/20/2015  . Abnormal LFTs 12/23/2014  . Osteoarthritis of right knee 11/24/2014    Past Surgical History:  Procedure Laterality Date  . CARDIAC CATHETERIZATION N/A 07/20/2015   Procedure: Left Heart Cath and Coronary Angiography;  Surgeon: Isaias Cowman, MD;   Location: [place] [facility];  Service: Cardiovascular;  Laterality: N/A;  . CARDIAC CATHETERIZATION N/A 07/20/2015   Procedure: Coronary Stent Intervention;  Surgeon: Isaias Cowman, MD;  Location: [place] [facility];  Service: Cardiovascular;  Laterality: N/A;  . CARDIAC CATHETERIZATION N/A 07/23/2015   Procedure: Left Heart Cath and Coronary Angiography;  Surgeon: Lorretta Harp, MD;  Location: [place] [facility];  Service: Cardiovascular;  Laterality: N/A;  . CARDIAC CATHETERIZATION N/A 07/23/2015   Procedure: Coronary Balloon Angioplasty;  Surgeon: Lorretta Harp, MD;  Location: [place] [facility];  Service: Cardiovascular;  Laterality: N/A;  . KNEE SURGERY Right 15 years ago    Prior to Admission medications   Medication Sig Start Date End Date Taking? Authorizing Provider  aspirin EC 81 MG tablet Take 81 mg by mouth daily.    Historical Provider, MD  atorvastatin (LIPITOR) 80 MG tablet Take 1 tablet (80 mg total) by mouth daily at 6 PM. Patient taking differently: Take 40 mg by mouth daily at 6 PM.  07/27/15   Eileen Stanford, PA-C  clopidogrel (PLAVIX) 75 MG tablet Take 1 tablet (75 mg total) by mouth daily. 01/26/16   Tawni Millers, MD  clotrimazole-betamethasone (LOTRISONE) cream Apply 1 application topically 2 (two) times daily. 03/31/16   Jenise V Bacon Menshew, PA-C  diphenhydrAMINE (BENADRYL) 25 mg capsule Take 1 capsule (25 mg total) by mouth every 6 (six) hours as needed. 07/22/15   Henreitta Leber, MD  hydrOXYzine (VISTARIL) 25 MG capsule Take 1 capsule (25 mg total) by mouth 3 (three) times  daily as needed for itching. Take 1-2 tabs by mouth 3 (three) times a day as needed for itching Patient not taking: Reported on 01/26/2016 06/30/15   Dannielle Karvonen Menshew, PA-C  metoprolol tartrate (LOPRESSOR) 25 MG tablet Take 1 tablet by mouth 2 times daily. 04/18/16   Larene Beach A McGowan, PA-C  nitroGLYCERIN (NITROSTAT) 0.4 MG SL tablet Place 1 tablet (0.4 mg total) under the  tongue every 5 (five) minutes x 3 doses as needed for chest pain. 07/27/15   Eileen Stanford, PA-C  prasugrel (EFFIENT) 10 MG TABS tablet Take 1 tablet (10 mg total) by mouth daily. Patient not taking: Reported on 01/26/2016 07/27/15   Eileen Stanford, PA-C  ranitidine (ZANTAC) 150 MG tablet Take 1 tablet (150 mg total) by mouth 2 (two) times daily. 01/26/16   Tawni Millers, MD    Allergies Patient has no known allergies.  Family History  Problem Relation Age of Onset  . Breast cancer Sister 71    Social History Social History  Substance Use Topics  . Smoking status: Never Smoker  . Smokeless tobacco: Never Used  . Alcohol use No    Review of Systems Constitutional: No recent illness. Cardiovascular: Denies chest pain or palpitations. Respiratory: Denies shortness of breath. Musculoskeletal: Pain in right knee and fibular head. Skin: Negative for rash, wound, lesion. Neurological: Negative for focal weakness or numbness.  ____________________________________________   PHYSICAL EXAM:  VITAL SIGNS: ED Triage Vitals  Enc Vitals Group     BP 05/16/16 1456 120/76     Pulse Rate 05/16/16 1456 (!) 51     Resp 05/16/16 1456 18     Temp 05/16/16 1456 97.3 F (36.3 C)     Temp Source 05/16/16 1456 Oral     SpO2 05/16/16 1456 98 %     Weight 05/16/16 1448 280 lb (127 kg)     Height 05/16/16 1448 5\' 6"  (1.676 m)     Head Circumference --      Peak Flow --      Pain Score 05/16/16 1448 9     Pain Loc --      Pain Edu? --      Excl. in [place]? --     Constitutional: Alert and oriented. Well appearing and in no acute distress. Eyes: Conjunctivae are normal. EOMI. Head: Atraumatic. Neck: No stridor.  Respiratory: Normal respiratory effort.   Musculoskeletal: Patient's right extensor mechanism of the knee is intact. Patient has 2 out of 5 strength in the right lower extremity. Diminished strength is likely secondary to pain. Patient has 5 out of 5 strength in the left lower  extremity. Neurologic:  Normal speech and language. No gross focal neurologic deficits are appreciated. Speech is normal. No gait instability. Skin:  Skin is warm, dry and intact. Atraumatic. Psychiatric: Mood and affect are normal. Speech and behavior are normal.  ____________________________________________   LABS (all labs ordered are listed, but only abnormal results are displayed)  Labs Reviewed - No data to display ____________________________________________  RADIOLOGY I, Lannie Fields, personally viewed and evaluated these images (plain radiographs) as part of my medical decision making, as well as reviewing the written report by the radiologist.  Right Knee X Ray: No fractures or bony lesions. Right Fibula X Ray: No fractures or bony lesions. ____________________________________________   PROCEDURES  Procedure(s) performed: None   INITIAL IMPRESSION / ASSESSMENT AND PLAN / ED COURSE  Clinical Course     Assessment:  Right knee pain:  Patient has right knee pain after a fall at church on Sunday. Right knee and fibula x-rays indicate no bony abnormalities or fractures besides arthritic changes.Patient was prescribed Mobic to be used as needed for pain and inflammation. Patient was referred to orthopedist on-call, Dr. Sabra Heck. All patient questions were answered.  FINAL CLINICAL IMPRESSION(S) / ED DIAGNOSES  Final diagnoses:  None       Lannie Fields, PA-C 05/16/16 1605    Delman Kitten, MD 05/19/16 1020

## 2016-05-16 NOTE — ED Triage Notes (Signed)
Pt fell 2 days ago and injure right knee, pt complains of pain and swelling to knee, pt denies any other symptoms

## 2016-05-18 ENCOUNTER — Other Ambulatory Visit: Payer: Self-pay | Admitting: Internal Medicine

## 2016-05-18 DIAGNOSIS — R609 Edema, unspecified: Secondary | ICD-10-CM

## 2016-05-23 ENCOUNTER — Ambulatory Visit
Admission: RE | Admit: 2016-05-23 | Discharge: 2016-05-23 | Disposition: A | Discharge status: Home / Self Care | Payer: Medicaid Other | Source: Ambulatory Visit | Attending: Internal Medicine | Admitting: Internal Medicine

## 2016-05-23 DIAGNOSIS — R600 Localized edema: Secondary | ICD-10-CM | POA: Insufficient documentation

## 2016-05-23 DIAGNOSIS — R609 Edema, unspecified: Secondary | ICD-10-CM

## 2016-05-24 ENCOUNTER — Ambulatory Visit: Payer: Self-pay | Admitting: Internal Medicine

## 2016-05-24 ENCOUNTER — Ambulatory Visit: Payer: Self-pay | Admitting: Ophthalmology

## 2016-06-01 ENCOUNTER — Telehealth: Payer: Self-pay | Admitting: Gastroenterology

## 2016-06-01 NOTE — Telephone Encounter (Signed)
colonoscopy

## 2016-06-07 ENCOUNTER — Ambulatory Visit: Payer: Self-pay | Admitting: Ophthalmology

## 2016-06-09 ENCOUNTER — Telehealth: Payer: Self-pay

## 2016-06-09 ENCOUNTER — Other Ambulatory Visit: Payer: Self-pay

## 2016-06-09 NOTE — Telephone Encounter (Signed)
Please precert   Screening colonoscopy at [facility] with Wohl on 07/07/16.

## 2016-06-09 NOTE — Telephone Encounter (Signed)
Gastroenterology Pre-Procedure Review  Request Date:  Requesting Physician: Dr.   PATIENT REVIEW QUESTIONS: The patient responded to the following health history questions as indicated:    1. Are you having any GI issues? no 2. Do you have a personal history of Polyps? no 3. Do you have a family history of Colon Cancer or Polyps? yes (Uncle colon cancer) 4. Diabetes Mellitus? no but borderline 5. Joint replacements in the past 12 months?no 6. Major health problems in the past 3 months?no had heart attack 07/2015 7. Any artificial heart valves, MVP, or defibrillator?no    MEDICATIONS & ALLERGIES:    Patient reports the following regarding taking any anticoagulation/antiplatelet therapy:   Plavix, Coumadin, Eliquis, Xarelto, Lovenox, Pradaxa, Brilinta, or Effient? no Aspirin? no  Patient confirms/reports the following medications:  Current Outpatient Prescriptions  Medication Sig Dispense Refill  . aspirin EC 81 MG tablet Take 81 mg by mouth daily.    Marland Kitchen atorvastatin (LIPITOR) 80 MG tablet Take 1 tablet (80 mg total) by mouth daily at 6 PM. (Patient taking differently: Take 40 mg by mouth daily at 6 PM. ) 30 tablet 11  . clopidogrel (PLAVIX) 75 MG tablet Take 1 tablet (75 mg total) by mouth daily. 30 tablet 3  . clotrimazole-betamethasone (LOTRISONE) cream Apply 1 application topically 2 (two) times daily. 45 g 1  . diphenhydrAMINE (BENADRYL) 25 mg capsule Take 1 capsule (25 mg total) by mouth every 6 (six) hours as needed. 30 capsule 0  . hydrOXYzine (VISTARIL) 25 MG capsule Take 1 capsule (25 mg total) by mouth 3 (three) times daily as needed for itching. Take 1-2 tabs by mouth 3 (three) times a day as needed for itching (Patient not taking: Reported on 01/26/2016) 30 capsule 0  . meloxicam (MOBIC) 7.5 MG tablet Take 1 tablet (7.5 mg total) by mouth daily. 30 tablet 0  . metoprolol tartrate (LOPRESSOR) 25 MG tablet Take 1 tablet by mouth 2 times daily. 180 tablet 0  . nitroGLYCERIN  (NITROSTAT) 0.4 MG SL tablet Place 1 tablet (0.4 mg total) under the tongue every 5 (five) minutes x 3 doses as needed for chest pain. 25 tablet 12  . prasugrel (EFFIENT) 10 MG TABS tablet Take 1 tablet (10 mg total) by mouth daily. (Patient not taking: Reported on 01/26/2016) 90 tablet 9  . ranitidine (ZANTAC) 150 MG tablet Take 1 tablet (150 mg total) by mouth 2 (two) times daily. 180 tablet 2   No current facility-administered medications for this visit.     Patient confirms/reports the following allergies:  Allergies  Allergen Reactions  . Enalapril Swelling    Swelling face    No orders of the defined types were placed in this encounter.   AUTHORIZATION INFORMATION Primary Insurance: 1D#: Group #:  Secondary Insurance: 1D#: Group #:  SCHEDULE INFORMATION: Date: 07/07/16 Time: Location: [place]

## 2016-06-13 NOTE — Telephone Encounter (Signed)
Patient has Medicaid. Pre cert is not required

## 2016-06-29 ENCOUNTER — Encounter: Payer: Self-pay | Admitting: *Deleted

## 2016-06-29 ENCOUNTER — Encounter: Payer: Self-pay | Admitting: Student in an Organized Health Care Education/Training Program

## 2016-07-05 NOTE — Discharge Instructions (Signed)

## 2016-07-11 ENCOUNTER — Emergency Department: Payer: Medicaid Other

## 2016-07-11 ENCOUNTER — Other Ambulatory Visit: Payer: Self-pay

## 2016-07-11 ENCOUNTER — Observation Stay
Admission: EM | Admit: 2016-07-11 | Discharge: 2016-07-13 | Disposition: A | Discharge status: Home / Self Care | Payer: Medicaid Other | Attending: Internal Medicine | Admitting: Internal Medicine

## 2016-07-11 DIAGNOSIS — R079 Chest pain, unspecified: Secondary | ICD-10-CM | POA: Diagnosis present

## 2016-07-11 DIAGNOSIS — Z7902 Long term (current) use of antithrombotics/antiplatelets: Secondary | ICD-10-CM | POA: Diagnosis not present

## 2016-07-11 DIAGNOSIS — Z7982 Long term (current) use of aspirin: Secondary | ICD-10-CM | POA: Insufficient documentation

## 2016-07-11 DIAGNOSIS — E7800 Pure hypercholesterolemia, unspecified: Secondary | ICD-10-CM | POA: Insufficient documentation

## 2016-07-11 DIAGNOSIS — R001 Bradycardia, unspecified: Secondary | ICD-10-CM | POA: Diagnosis not present

## 2016-07-11 DIAGNOSIS — M25512 Pain in left shoulder: Secondary | ICD-10-CM | POA: Diagnosis not present

## 2016-07-11 DIAGNOSIS — I252 Old myocardial infarction: Secondary | ICD-10-CM | POA: Diagnosis not present

## 2016-07-11 DIAGNOSIS — I10 Essential (primary) hypertension: Secondary | ICD-10-CM | POA: Diagnosis not present

## 2016-07-11 DIAGNOSIS — D649 Anemia, unspecified: Secondary | ICD-10-CM | POA: Insufficient documentation

## 2016-07-11 DIAGNOSIS — I25110 Atherosclerotic heart disease of native coronary artery with unstable angina pectoris: Secondary | ICD-10-CM | POA: Diagnosis not present

## 2016-07-11 DIAGNOSIS — Z87891 Personal history of nicotine dependence: Secondary | ICD-10-CM | POA: Diagnosis not present

## 2016-07-11 DIAGNOSIS — Z79899 Other long term (current) drug therapy: Secondary | ICD-10-CM | POA: Insufficient documentation

## 2016-07-11 HISTORY — DX: Chest pain, unspecified: R07.9

## 2016-07-11 LAB — CBC
HCT: 32.6 % — ABNORMAL LOW (ref 35.0–47.0)
HEMOGLOBIN: 10.9 g/dL — AB (ref 12.0–16.0)
MCH: 27.4 pg (ref 26.0–34.0)
MCHC: 33.4 g/dL (ref 32.0–36.0)
MCV: 82 fL (ref 80.0–100.0)
Platelets: 279 10*3/uL (ref 150–440)
RBC: 3.97 MIL/uL (ref 3.80–5.20)
RDW: 16 % — AB (ref 11.5–14.5)
WBC: 7.4 10*3/uL (ref 3.6–11.0)

## 2016-07-11 LAB — BASIC METABOLIC PANEL
ANION GAP: 6 (ref 5–15)
BUN: 12 mg/dL (ref 6–20)
CHLORIDE: 108 mmol/L (ref 101–111)
CO2: 27 mmol/L (ref 22–32)
Calcium: 8.7 mg/dL — ABNORMAL LOW (ref 8.9–10.3)
Creatinine, Ser: 0.9 mg/dL (ref 0.44–1.00)
GFR calc non Af Amer: >60 mL/min (ref >60)
Glucose, Bld: 79 mg/dL (ref 65–99)
POTASSIUM: 4.4 mmol/L (ref 3.5–5.1)
SODIUM: 141 mmol/L (ref 135–145)

## 2016-07-11 LAB — PROTIME-INR
INR: 0.98
PROTHROMBIN TIME: 13 s (ref 11.4–15.2)

## 2016-07-11 LAB — APTT: aPTT: 29 seconds (ref 24–36)

## 2016-07-11 LAB — TROPONIN I

## 2016-07-11 MED ORDER — HEPARIN (PORCINE) IN NACL 100-0.45 UNIT/ML-% IJ SOLN
1150.0000 [IU]/h | INTRAMUSCULAR | Status: DC
  Administered 2016-07-11: 1150 [IU]/h via INTRAVENOUS
  Filled 2016-07-11: qty 250

## 2016-07-11 MED ORDER — HEPARIN BOLUS VIA INFUSION
4000.0000 [IU] | Freq: Once | INTRAVENOUS | Status: AC
  Administered 2016-07-11: 4000 [IU] via INTRAVENOUS
  Filled 2016-07-11: qty 4000

## 2016-07-11 MED ORDER — NITROGLYCERIN 0.4 MG SL SUBL
0.4000 mg | SUBLINGUAL_TABLET | SUBLINGUAL | Status: DC | PRN
  Administered 2016-07-11: 0.4 mg via SUBLINGUAL
  Filled 2016-07-11: qty 1

## 2016-07-11 NOTE — Progress Notes (Signed)
ANTICOAGULATION CONSULT NOTE - Initial Consult  Pharmacy Consult for heparin bolus and drip Indication: chest pain/ACS  Allergies  Allergen Reactions  . Enalapril Swelling    Swelling face    Patient Measurements: Height: 5\' 6"  (167.6 cm) Weight: 280 lb (127 kg) IBW/kg (Calculated) : 59.3 Heparin Dosing Weight: 90 kg   Vital Signs: Temp: 98.4 F (36.9 C) (01/09 1720) Temp Source: Oral (01/09 1720) BP: 157/79 (01/09 1739) Pulse Rate: 58 (01/09 1739)  Labs:  Recent Labs  07/11/16 1724 07/11/16 1752  HGB  --  10.9*  HCT  --  32.6*  PLT  --  279  CREATININE 0.90  --   TROPONINI <0.03  --     Estimated Creatinine Clearance: 100.9 mL/min (by C-G formula based on SCr of 0.9 mg/dL).   Medical History: Past Medical History:  Diagnosis Date  . Arthritis    hips, right leg  . CAD in native artery, RCA, LAD and LCX 07/23/2015  . H/O myocardial infarction less than 8 weeks- STEMI inf wall 07/20/15- stent to RCA 07/20/15   inf wall STEMI with DES to RCA  . Hypercholesteremia   . Hypertension   . Periorbital edema    treated with steroids  . Seizures ([place])    as teenager, ages [age].  none since  . STEMI (ST elevation myocardial infarction) ([place]) 07/23/15    Medications:  Scheduled:  . heparin  4,000 Units Intravenous Once   Infusions:  . heparin      Assessment: Pharmacy has been consulted to dose heparin bolus and drip in this 24 yoF with chest pain. Pt has had worsening SOB for three weeks now having chest pain rated 5/10. Pt takes aspirin and Plavix, denied any anticoagulants prior to admission. Baseline labs have been ordered Anti Xa level ordered  Goal of Therapy:  Heparin level 0.3-0.7 units/ml Monitor platelets by anticoagulation protocol: Yes   Plan:  Give 4000 units bolus x 1 Start heparin infusion at 1150 units/hr Check anti-Xa level in 6 hours and daily while on heparin Continue to monitor H&H and platelets  Darrow Bussing, PharmD Pharmacy  Resident 07/11/2016 7:12 PM

## 2016-07-11 NOTE — ED Notes (Signed)
Pt taken to xray via stretcher  

## 2016-07-11 NOTE — ED Notes (Signed)
Lab called to draw repeat Trop I.

## 2016-07-11 NOTE — ED Provider Notes (Signed)
[facility] Emergency Department Provider Note  ____________________________________________   First MD Initiated Contact with Patient 07/11/16 1731     (approximate)  I have reviewed the triage vital signs and the nursing notes.   HISTORY  Chief Complaint Chest Pain   HPI Sarah Medina is a 51 y.o. female with a history of MI with a stent to the right RCA who is presenting to the emergency department today with 3 weeks of worsening shortness of breath and now chest pain. She says the chest pain is left-sided and under her axilla. She says it is worsened with exertion and associated with shortness of breath. She says that her pain is a 5 out of 10 at this time. She has not tried nitroglycerin but has been compliant with her aspirin and Plavix and last took it this morning.   Past Medical History:  Diagnosis Date  . Arthritis    hips, right leg  . CAD in native artery, RCA, LAD and LCX 07/23/2015  . H/O myocardial infarction less than 8 weeks- STEMI inf wall 07/20/15- stent to RCA 07/20/15   inf wall STEMI with DES to RCA  . Hypercholesteremia   . Hypertension   . Periorbital edema    treated with steroids  . Seizures ([place])    as teenager, ages [age].  none since  . STEMI (ST elevation myocardial infarction) ([place]) 07/23/15    Patient Active Problem List   Diagnosis Date Noted  . Ischemic heart disease 10/20/2015  . Essential hypertension 10/20/2015  . Hyperlipidemia 10/20/2015  . Acute posthemorrhagic anemia   . H/O myocardial infarction less than 8 weeks- STEMI inf wall 07/20/15- stent to RCA 07/23/2015  . CAD in native artery, RCA, LAD and LCX 07/23/2015  . Ventricular fibrillation ([place]) 07/23/2015  . ST elevation myocardial infarction (STEMI) of inferior wall ([place]) 07/20/2015  . Abnormal LFTs 12/23/2014  . Osteoarthritis of right knee 11/24/2014    Past Surgical History:  Procedure Laterality Date  . CARDIAC CATHETERIZATION N/A  07/20/2015   Procedure: Left Heart Cath and Coronary Angiography;  Surgeon: Isaias Cowman, MD;  Location: [place] [facility];  Service: Cardiovascular;  Laterality: N/A;  . CARDIAC CATHETERIZATION N/A 07/20/2015   Procedure: Coronary Stent Intervention;  Surgeon: Isaias Cowman, MD;  Location: [place] [facility];  Service: Cardiovascular;  Laterality: N/A;  . CARDIAC CATHETERIZATION N/A 07/23/2015   Procedure: Left Heart Cath and Coronary Angiography;  Surgeon: Lorretta Harp, MD;  Location: [place] [facility];  Service: Cardiovascular;  Laterality: N/A;  . CARDIAC CATHETERIZATION N/A 07/23/2015   Procedure: Coronary Balloon Angioplasty;  Surgeon: Lorretta Harp, MD;  Location: [place] [facility];  Service: Cardiovascular;  Laterality: N/A;  . KNEE SURGERY Right 15 years ago    Prior to Admission medications   Medication Sig Start Date End Date Taking? Authorizing Provider  aspirin EC 81 MG tablet Take 81 mg by mouth daily.    Historical Provider, MD  atorvastatin (LIPITOR) 80 MG tablet Take 1 tablet (80 mg total) by mouth daily at 6 PM. Patient taking differently: Take 40 mg by mouth daily at 6 PM.  07/27/15   Eileen Stanford, PA-C  clopidogrel (PLAVIX) 75 MG tablet Take 1 tablet (75 mg total) by mouth daily. 01/26/16   Tawni Millers, MD  diclofenac sodium (VOLTAREN) 1 % GEL Apply topically 4 (four) times daily.    Historical Provider, MD  losartan (COZAAR) 50 MG tablet Take 50 mg  by mouth daily.    Historical Provider, MD  metoprolol tartrate (LOPRESSOR) 25 MG tablet Take 1 tablet by mouth 2 times daily. 04/18/16   Larene Beach A McGowan, PA-C  nitroGLYCERIN (NITROSTAT) 0.4 MG SL tablet Place 1 tablet (0.4 mg total) under the tongue every 5 (five) minutes x 3 doses as needed for chest pain. 07/27/15   Eileen Stanford, PA-C    Allergies Enalapril  Family History  Problem Relation Age of Onset  . Breast cancer Sister 5    Social History Social History  Substance Use  Topics  . Smoking status: Never Smoker  . Smokeless tobacco: Former Systems developer    Types: Snuff    Quit date: 2014  . Alcohol use No    Review of Systems Constitutional: No fever/chills Eyes: No visual changes. ENT: No sore throat. Cardiovascular: As above Respiratory: As above Gastrointestinal: No abdominal pain.  No nausea, no vomiting.  No diarrhea.  No constipation. Genitourinary: Negative for dysuria. Musculoskeletal: Negative for back pain. Skin: Negative for rash. Neurological: Negative for headaches, focal weakness or numbness.  10-point ROS otherwise negative.  ____________________________________________   PHYSICAL EXAM:  VITAL SIGNS: ED Triage Vitals [07/11/16 1720]  Enc Vitals Group     BP 139/79     Pulse Rate (!) 55     Resp 18     Temp 98.4 F (36.9 C)     Temp Source Oral     SpO2 100 %     Weight 280 lb (127 kg)     Height 5\' 6"  (1.676 m)     Head Circumference      Peak Flow      Pain Score 8     Pain Loc      Pain Edu?      Excl. in [place]?     Constitutional: Alert and oriented. Well appearing and in no acute distress. Eyes: Conjunctivae are normal. PERRL. EOMI. Head: Atraumatic. Nose: No congestion/rhinnorhea. Mouth/Throat: Mucous membranes are moist.   Neck: No stridor.   Cardiovascular: Normal rate, regular rhythm. Grossly normal heart sounds.  Respiratory: Normal respiratory effort.  No retractions. Lungs CTAB. Gastrointestinal: Soft and nontender. No distention.  Musculoskeletal: No lower extremity tenderness nor edema.  No joint effusions. Neurologic:  Normal speech and language. No gross focal neurologic deficits are appreciated.  Skin:  Skin is warm, dry and intact. No rash noted. Psychiatric: Mood and affect are normal. Speech and behavior are normal.  ____________________________________________   LABS (all labs ordered are listed, but only abnormal results are displayed)  Labs Reviewed  BASIC METABOLIC PANEL  TROPONIN I  CBC    ____________________________________________  EKG  ED ECG REPORT I, Oluwademilade Kellett,  Youlanda Roys, the attending physician, personally viewed and interpreted this ECG.   Date: 07/11/2016  EKG Time: 1722  Rate: 52  Rhythm: sinus bradycardia  Axis: normal  Intervals:none  ST&T Change: ST elevation in lead 1 and 1 mm with T-wave peaking which is new since the last EKG from one year ago. Also with deeper T waves in aVR as well as new inversions in V1 and V2 and V3.  ____________________________________________  RADIOLOGY  DG Chest 2 View (Accession [id number]) (Order [id number])  Imaging  Date: 07/11/2016 Department: [place] Released By: Joaquin Music, RN (auto-released) Authorizing: Orbie Pyo, MD  Exam Information   Status Exam Begun  Exam Ended   Final [99] 07/11/2016 6:12 PM 07/11/2016 6:15 PM  PACS Images  Show images for DG Chest 2 View  Study Result   CLINICAL DATA:  Left-sided chest pain  EXAM: CHEST  2 VIEW  COMPARISON:  07/24/2015  FINDINGS: The heart size and mediastinal contours are within normal limits. Both lungs are clear. The visualized skeletal structures are unremarkable.  IMPRESSION: No active cardiopulmonary disease.   Electronically Signed   By: Donavan Foil M.D.   On: 07/11/2016 18:19     ____________________________________________   PROCEDURES  Procedure(s) performed:   Angiocath insertion Performed by: Doran Stabler  Consent: Verbal consent obtained. Risks and benefits: risks, benefits and alternatives were discussed Time out: Immediately prior to procedure a "time out" was called to verify the correct patient, procedure, equipment, support staff and site/side marked as required.  Preparation: Patient was prepped and draped in the usual sterile fashion.  Vein Location: right basilic  Ultrasound Guided  Gauge: 18  Normal blood return and flush without  difficulty Patient tolerance: Patient tolerated the procedure well with no immediate complications.     Procedures  Critical Care performed:   ____________________________________________   INITIAL IMPRESSION / ASSESSMENT AND PLAN / ED COURSE  Pertinent labs & imaging results that were available during my care of the patient were reviewed by me and considered in my medical decision making (see chart for details).   Clinical Course   ----------------------------------------- 6:23 PM on 07/11/2016 -----------------------------------------  I discussed the case with Dr. Ubaldo Glassing, who reviewed the EKG and recommends heparin as well as admission. He agrees that the EKG does not appears a STEMI at this time. The patient is resting without any distress or diaphoresis. Clinical presentation at this time is inconsistent with stemi as well.  ----------------------------------------- 7:24 PM on 07/11/2016 -----------------------------------------    Patient now pain free after nitroglycerin. We'll be admitted to the hospital. ____________________________________________   FINAL CLINICAL IMPRESSION(S) / ED DIAGNOSES  Chest pain.    NEW MEDICATIONS STARTED DURING THIS VISIT:  New Prescriptions   No medications on file     Note:  This document was prepared using Dragon voice recognition software and may include unintentional dictation errors.    Orbie Pyo, MD 07/11/16 [phone]

## 2016-07-11 NOTE — ED Triage Notes (Signed)
SOB X 2 days and pressure pain to left axilla and left chest beginning today. Pt alert and oriented X4, active, cooperative, pt in NAD. RR even and unlabored, color WNL.

## 2016-07-11 NOTE — H&P (Signed)
History and Physical   [facility] - [place] [facility] Admission History and Physical [employer], D.O.    Patient Name: Sarah Medina MR#: [id number] Date of Birth: [birth year] Date of Admission: 07/11/2016  Referring MD/NP/PA: Clearnce Hasten Primary Care Physician: Nino Glow McLean-Scocuzza, MD Outpatient Specialists: Dr. Saralyn Pilar  Patient coming from: Home  Chief Complaint: Left shoulder pain  HPI: Sarah Medina is a 51 y.o. female with a known history of ischemic heart disease, coronary artery disease status post MI, STEMI on 07/20/15 with a drug-eluting stent in the RCA, hypertension, hyperlipidemia, seizures was in a usual state of health until the past 3 weeks she has had some mild noticeable shortness of breath, dyspnea on exertion. In the emergency department today with a complaint of 2 days history of intermittent left sided shoulder pain which radiates into her left arm, is described as 5 out of 10 and associated with diaphoresis. She denies nausea, vomiting, palpitations, dizziness, lightheadedness.. She states that the pain occurs at rest and there is nothing that makes it better or worse. She denies any new exercise, physical activity or trauma..   Otherwise there has been no change in status. Patient has been taking medication as prescribed and there has been no recent change in medication or diet.  No recent antibiotics.  There has been no recent illness, hospitalizations, travel or sick contacts.    Patient denies fevers/chills, weakness, dizziness, chest pain, shortness of breath, N/V/C/D, abdominal pain, dysuria/frequency, changes in mental status.   ED Course:  Patient received nitro x 3 and heparin drip. She took her aspirin and Plavix this morning  Review of Systems:  CONSTITUTIONAL: No fever/chills, fatigue, weakness, weight gain/loss, headache. EYES: No blurry or double vision. ENT: No tinnitus, postnasal drip, redness or soreness of the  oropharynx. RESPIRATORY: No cough, dyspnea, wheeze.  No hemoptysis.  CARDIOVASCULAR: No chest pain, palpitations, syncope, orthopnea. No lower extremity edema.  GASTROINTESTINAL: No nausea, vomiting, abdominal pain, diarrhea, constipation.  No hematemesis, melena or hematochezia. GENITOURINARY: No dysuria, frequency, hematuria. ENDOCRINE: No polyuria or nocturia. No heat or cold intolerance. HEMATOLOGY: No anemia, bruising, bleeding. INTEGUMENTARY: No rashes, ulcers, lesions. MUSCULOSKELETAL: No arthritis, gout, dyspnea. Positive for left shoulder pain NEUROLOGIC: No numbness, tingling, ataxia, seizure-type activity, weakness. PSYCHIATRIC: No anxiety, depression, insomnia.   Past Medical History:  Diagnosis Date  . Arthritis    hips, right leg  . CAD in native artery, RCA, LAD and LCX 07/23/2015  . H/O myocardial infarction less than 8 weeks- STEMI inf wall 07/20/15- stent to RCA 07/20/15   inf wall STEMI with DES to RCA  . Hypercholesteremia   . Hypertension   . Periorbital edema    treated with steroids  . Seizures ([place])    as teenager, ages [age].  none since  . STEMI (ST elevation myocardial infarction) ([place]) 07/23/15    Past Surgical History:  Procedure Laterality Date  . CARDIAC CATHETERIZATION N/A 07/20/2015   Procedure: Left Heart Cath and Coronary Angiography;  Surgeon: Isaias Cowman, MD;  Location: [place] [facility];  Service: Cardiovascular;  Laterality: N/A;  . CARDIAC CATHETERIZATION N/A 07/20/2015   Procedure: Coronary Stent Intervention;  Surgeon: Isaias Cowman, MD;  Location: [place] [facility];  Service: Cardiovascular;  Laterality: N/A;  . CARDIAC CATHETERIZATION N/A 07/23/2015   Procedure: Left Heart Cath and Coronary Angiography;  Surgeon: Lorretta Harp, MD;  Location: [place] [facility];  Service: Cardiovascular;  Laterality: N/A;  . CARDIAC CATHETERIZATION N/A 07/23/2015   Procedure: Coronary  Balloon Angioplasty;  Surgeon: Lorretta Harp, MD;   Location: [place] [facility];  Service: Cardiovascular;  Laterality: N/A;  . KNEE SURGERY Right 15 years ago     reports that she has never smoked. She quit smokeless tobacco use about 4 years ago. Her smokeless tobacco use included Snuff. She reports that she does not drink alcohol. Her drug history is not on file.  Allergies  Allergen Reactions  . Enalapril Swelling    Swelling face    Family History  Problem Relation Age of Onset  . Breast cancer Sister 53  Father with MI  Family history has been reviewed and confirmed with patient.   Prior to Admission medications   Medication Sig Start Date End Date Taking? Authorizing Provider  aspirin EC 81 MG tablet Take 81 mg by mouth daily.   Yes Historical Provider, MD  atorvastatin (LIPITOR) 80 MG tablet Take 1 tablet (80 mg total) by mouth daily at 6 PM. Patient taking differently: Take 40 mg by mouth daily at 6 PM.  07/27/15  Yes Eileen Stanford, PA-C  clopidogrel (PLAVIX) 75 MG tablet Take 1 tablet (75 mg total) by mouth daily. 01/26/16  Yes Tawni Millers, MD  diclofenac sodium (VOLTAREN) 1 % GEL Apply topically 4 (four) times daily.   Yes Historical Provider, MD  losartan (COZAAR) 50 MG tablet Take 50 mg by mouth daily.   Yes Historical Provider, MD  metoprolol tartrate (LOPRESSOR) 25 MG tablet Take 1 tablet by mouth 2 times daily. 04/18/16  Yes Shannon A McGowan, PA-C  nitroGLYCERIN (NITROSTAT) 0.4 MG SL tablet Place 1 tablet (0.4 mg total) under the tongue every 5 (five) minutes x 3 doses as needed for chest pain. 07/27/15   Eileen Stanford, PA-C    Physical Exam: Vitals:   07/11/16 1739 07/11/16 1830 07/11/16 1900 07/11/16 1930  BP: (!) 157/79 (!) 143/79 (!) 141/76 (!) 153/75  Pulse: (!) 58 (!) 53 (!) 51 (!) 51  Resp: 15 15 19 15   Temp:      TempSrc:      SpO2: 100% 99% 99% 100%  Weight:      Height:        GENERAL: 51 y.o.-year-old Black female patient, well-developed, well-nourished lying in the bed in no acute  distress.  Pleasant and cooperative.   HEENT: Head atraumatic, normocephalic. Pupils equal, round, reactive to light and accommodation. No scleral icterus. Extraocular muscles intact. Nares are patent. Oropharynx is clear. Mucus membranes moist. NECK: Supple, full range of motion. No JVD, no bruit heard. No thyroid enlargement, no tenderness, no cervical lymphadenopathy. CHEST: Normal breath sounds bilaterally. No wheezing, rales, rhonchi or crackles. No use of accessory muscles of respiration.  No reproducible chest wall tenderness.  CARDIOVASCULAR: S1, S2 normal. No murmurs, rubs, or gallops. Cap refill <2 seconds. Pulses intact distally.  ABDOMEN: Soft, nondistended, nontender. No rebound, guarding, rigidity. Normoactive bowel sounds present in all four quadrants. No organomegaly or mass. EXTREMITIES: No pedal edema, cyanosis, or clubbing. No calf tenderness or Homan's sign. Left shoulder with full range of motion. There is reproducible tenderness to the left before meals joint and left trapezius muscle spasm NEUROLOGIC: The patient is alert and oriented x 3. Cranial nerves II through XII are grossly intact with no focal sensorimotor deficit. Muscle strength 5/5 in all extremities. Sensation intact. Gait not checked. PSYCHIATRIC:  Normal affect, mood, thought content. SKIN: Warm, dry, and intact without obvious rash, lesion, or ulcer.    Labs on  Admission:  CBC:  Recent Labs Lab 07/11/16 1752  WBC 7.4  HGB 10.9*  HCT 32.6*  MCV 82.0  PLT 123XX123   Basic Metabolic Panel:  Recent Labs Lab 07/11/16 1724  NA 141  K 4.4  CL 108  CO2 27  GLUCOSE 79  BUN 12  CREATININE 0.90  CALCIUM 8.7*   GFR: Estimated Creatinine Clearance: 100.9 mL/min (by C-G formula based on SCr of 0.9 mg/dL). Liver Function Tests: No results for input(s): AST, ALT, ALKPHOS, BILITOT, PROT, ALBUMIN in the last 168 hours. No results for input(s): LIPASE, AMYLASE in the last 168 hours. No results for input(s):  AMMONIA in the last 168 hours. Coagulation Profile:  Recent Labs Lab 07/11/16 1850  INR 0.98   Cardiac Enzymes:  Recent Labs Lab 07/11/16 1724  TROPONINI <0.03   BNP (last 3 results) No results for input(s): PROBNP in the last 8760 hours. HbA1C: No results for input(s): HGBA1C in the last 72 hours. CBG: No results for input(s): GLUCAP in the last 168 hours. Lipid Profile: No results for input(s): CHOL, HDL, LDLCALC, TRIG, CHOLHDL, LDLDIRECT in the last 72 hours. Thyroid Function Tests: No results for input(s): TSH, T4TOTAL, FREET4, T3FREE, THYROIDAB in the last 72 hours. Anemia Panel: No results for input(s): VITAMINB12, FOLATE, FERRITIN, TIBC, IRON, RETICCTPCT in the last 72 hours. Urine analysis:    Component Value Date/Time   COLORURINE Red 12/25/2011 1253   APPEARANCEUR Cloudy 12/25/2011 1253   LABSPEC 1.028 12/25/2011 1253   PHURINE 5.0 12/25/2011 1253   GLUCOSEU Negative 12/25/2011 1253   HGBUR 3+ 12/25/2011 1253   BILIRUBINUR Negative 12/25/2011 1253   KETONESUR Trace 12/25/2011 1253   PROTEINUR 100 mg/dL 12/25/2011 1253   NITRITE Positive 12/25/2011 1253   LEUKOCYTESUR 1+ 12/25/2011 1253   Sepsis Labs: @LABRCNTIP (procalcitonin:4,lacticidven:4) )No results found for this or any previous visit (from the past 240 hour(s)).   Radiological Exams on Admission: Dg Chest 2 View  Result Date: 07/11/2016 CLINICAL DATA:  Left-sided chest pain EXAM: CHEST  2 VIEW COMPARISON:  07/24/2015 FINDINGS: The heart size and mediastinal contours are within normal limits. Both lungs are clear. The visualized skeletal structures are unremarkable. IMPRESSION: No active cardiopulmonary disease. Electronically Signed   By: Donavan Foil M.D.   On: 07/11/2016 18:19    EKG: And bradycardia at 52 bpm with normal axis, new T-wave inversions and V1, V2, V3. There is a new ST elevation in lead 1 and nonspecific ST-T wave changes.   Assessment/Plan Active Problems:   Chest pain, rule  out acute myocardial infarction    This is a 51 y.o. female with a history of  ischemic heart disease, coronary artery disease status post MI, STEMI on 07/20/15 with a drug-eluting stent in the RCA, hypertension, hyperlipidemia, seizures  now being admitted with: 1. Chest pain rule out ACS. Emergency department physician discussed with cardiology who recommended heparin drip and admission for rule out.  - Admit to observation with telemetry monitoring. - Trend troponins, check lipids and TSH. - Morphine, nitro ordered -Continue metoprolol, Cozaar, aspirin, Plavix, Lipitor  -Check echo - Cardiology consultation has been requested.  2. Hypertension - continue Cozaar, Toprol 3. Hyperlipidemia - continue Lipitor 4. History of CAD - continue ASA, Plavix 5. History of anemia, chronic and stable  Admission status:  Observation, telemetry IV Fluids:  Normal saline Diet/Nutrition:  Nothing by mouth Consults called:  Cardiology  DVT Px:  HeparinSCDs and early ambulation. Code Status: Full Code  Disposition Plan:  To home  in 1-2 days   All the records are reviewed and case discussed with ED provider. Management plans discussed with the patient and/or family who express understanding and agree with plan of care.  Add Dinapoli D.O. on 07/11/2016 at 8:36 PM Between 7am to 6pm - Pager - [phone] After 6pm go to [email or web address] - Proofreader [facility] [phone] CC: Primary care physician; Nino Glow McLean-Scocuzza, MD   07/11/2016, 8:36 PM

## 2016-07-11 NOTE — ED Notes (Signed)
2 unsuccessful PIV attempts by this RN (RIGHT forearm and RIGHT hand).

## 2016-07-11 NOTE — ED Notes (Signed)
Pt c/o of L CP at top of breast that goes to L shoulder x 2 days. States sweaty and SOB. States heart attack January 2017 with successful heart cath. Pt appears in no distress at this time, alert and oriented.

## 2016-07-12 ENCOUNTER — Other Ambulatory Visit: Payer: Self-pay

## 2016-07-12 ENCOUNTER — Encounter
Admission: EM | Disposition: A | Discharge status: Home / Self Care | Payer: Self-pay | Source: Home / Self Care | Attending: Family Medicine

## 2016-07-12 HISTORY — PX: CARDIAC CATHETERIZATION: SHX172

## 2016-07-12 LAB — BASIC METABOLIC PANEL
Anion gap: 7 (ref 5–15)
BUN: 10 mg/dL (ref 6–20)
CHLORIDE: 106 mmol/L (ref 101–111)
CO2: 27 mmol/L (ref 22–32)
CREATININE: 0.78 mg/dL (ref 0.44–1.00)
Calcium: 8.2 mg/dL — ABNORMAL LOW (ref 8.9–10.3)
GFR calc Af Amer: >60 mL/min (ref >60)
GFR calc non Af Amer: >60 mL/min (ref >60)
GLUCOSE: 99 mg/dL (ref 65–99)
Potassium: 3.4 mmol/L — ABNORMAL LOW (ref 3.5–5.1)
Sodium: 140 mmol/L (ref 135–145)

## 2016-07-12 LAB — TROPONIN I
Troponin I: <0.03 ng/mL (ref <0.03)
Troponin I: <0.03 ng/mL (ref <0.03)

## 2016-07-12 LAB — CBC
HEMATOCRIT: 33.7 % — AB (ref 35.0–47.0)
Hemoglobin: 11 g/dL — ABNORMAL LOW (ref 12.0–16.0)
MCH: 27.2 pg (ref 26.0–34.0)
MCHC: 32.7 g/dL (ref 32.0–36.0)
MCV: 83.1 fL (ref 80.0–100.0)
Platelets: 252 10*3/uL (ref 150–440)
RBC: 4.05 MIL/uL (ref 3.80–5.20)
RDW: 15.7 % — AB (ref 11.5–14.5)
WBC: 7 10*3/uL (ref 3.6–11.0)

## 2016-07-12 LAB — PROTIME-INR
INR: 1.07
Prothrombin Time: 13.9 seconds (ref 11.4–15.2)

## 2016-07-12 LAB — HEPARIN LEVEL (UNFRACTIONATED)
HEPARIN UNFRACTIONATED: 0.57 [IU]/mL (ref 0.30–0.70)
Heparin Unfractionated: 0.45 IU/mL (ref 0.30–0.70)

## 2016-07-12 SURGERY — LEFT HEART CATH
Anesthesia: Moderate Sedation

## 2016-07-12 MED ORDER — ASPIRIN 81 MG PO CHEW
81.0000 mg | CHEWABLE_TABLET | ORAL | Status: AC
  Administered 2016-07-12: 81 mg via ORAL

## 2016-07-12 MED ORDER — SODIUM CHLORIDE 0.9% FLUSH: 3.0000 mL | INTRAVENOUS | Status: DC | PRN

## 2016-07-12 MED ORDER — MORPHINE SULFATE (PF) 2 MG/ML IV SOLN: 2.0000 mg | INTRAVENOUS | Status: DC | PRN

## 2016-07-12 MED ORDER — IOPAMIDOL (ISOVUE-300) INJECTION 61%
INTRAVENOUS | Status: DC | PRN
  Administered 2016-07-12: 250 mL via INTRA_ARTERIAL

## 2016-07-12 MED ORDER — SODIUM CHLORIDE 0.9% FLUSH: 3.0000 mL | Freq: Two times a day (BID) | INTRAVENOUS | Status: DC

## 2016-07-12 MED ORDER — POTASSIUM CHLORIDE CRYS ER 20 MEQ PO TBCR
20.0000 meq | EXTENDED_RELEASE_TABLET | Freq: Every day | ORAL | Status: DC
  Administered 2016-07-13: 20 meq via ORAL
  Filled 2016-07-12: qty 1

## 2016-07-12 MED ORDER — ADENOSINE (DIAGNOSTIC) 3 MG/ML IV SOLN
INTRAVENOUS | Status: AC
  Filled 2016-07-12: qty 30

## 2016-07-12 MED ORDER — METOPROLOL TARTRATE 25 MG PO TABS
25.0000 mg | ORAL_TABLET | Freq: Two times a day (BID) | ORAL | Status: DC
  Administered 2016-07-12: 25 mg via ORAL
  Filled 2016-07-12: qty 1

## 2016-07-12 MED ORDER — POTASSIUM CHLORIDE CRYS ER 20 MEQ PO TBCR
40.0000 meq | EXTENDED_RELEASE_TABLET | Freq: Once | ORAL | Status: AC
  Administered 2016-07-12: 40 meq via ORAL
  Filled 2016-07-12: qty 2

## 2016-07-12 MED ORDER — METOPROLOL TARTRATE 25 MG PO TABS
12.5000 mg | ORAL_TABLET | Freq: Two times a day (BID) | ORAL | Status: DC
  Administered 2016-07-13: 12.5 mg via ORAL
  Filled 2016-07-12: qty 1

## 2016-07-12 MED ORDER — ACETAMINOPHEN 325 MG PO TABS
ORAL_TABLET | ORAL | Status: AC
  Administered 2016-07-12: 13:00:00
  Filled 2016-07-12: qty 2

## 2016-07-12 MED ORDER — ACETAMINOPHEN 325 MG PO TABS
650.0000 mg | ORAL_TABLET | ORAL | Status: DC | PRN
  Administered 2016-07-12 (×2): 650 mg via ORAL

## 2016-07-12 MED ORDER — NITROGLYCERIN 0.4 MG SL SUBL: 0.4000 mg | SUBLINGUAL_TABLET | SUBLINGUAL | Status: DC | PRN

## 2016-07-12 MED ORDER — ASPIRIN 81 MG PO CHEW
CHEWABLE_TABLET | ORAL | Status: AC
  Administered 2016-07-12: 81 mg
  Filled 2016-07-12: qty 1

## 2016-07-12 MED ORDER — ATORVASTATIN CALCIUM 20 MG PO TABS
40.0000 mg | ORAL_TABLET | Freq: Every day | ORAL | Status: DC
  Administered 2016-07-12: 40 mg via ORAL
  Filled 2016-07-12: qty 2

## 2016-07-12 MED ORDER — VERAPAMIL HCL 2.5 MG/ML IV SOLN
INTRAVENOUS | Status: AC
  Filled 2016-07-12: qty 2

## 2016-07-12 MED ORDER — SODIUM CHLORIDE 0.9 % IV SOLN: 250.0000 mL | INTRAVENOUS | Status: DC | PRN

## 2016-07-12 MED ORDER — MIDAZOLAM HCL 2 MG/2ML IJ SOLN
INTRAMUSCULAR | Status: AC
  Filled 2016-07-12: qty 2

## 2016-07-12 MED ORDER — FENTANYL CITRATE (PF) 100 MCG/2ML IJ SOLN
INTRAMUSCULAR | Status: AC
  Filled 2016-07-12: qty 2

## 2016-07-12 MED ORDER — MORPHINE SULFATE (PF) 4 MG/ML IV SOLN: 2.0000 mg | INTRAVENOUS | Status: DC | PRN

## 2016-07-12 MED ORDER — ONDANSETRON HCL 4 MG/2ML IJ SOLN: 4.0000 mg | Freq: Four times a day (QID) | INTRAMUSCULAR | Status: DC | PRN

## 2016-07-12 MED ORDER — SODIUM CHLORIDE 0.9 % WEIGHT BASED INFUSION: 3.0000 mL/kg/h | INTRAVENOUS | Status: DC

## 2016-07-12 MED ORDER — ZOLPIDEM TARTRATE 5 MG PO TABS: 5.0000 mg | ORAL_TABLET | Freq: Every evening | ORAL | Status: DC | PRN

## 2016-07-12 MED ORDER — CLOPIDOGREL BISULFATE 300 MG PO TABS
ORAL_TABLET | ORAL | Status: AC
  Filled 2016-07-12: qty 1

## 2016-07-12 MED ORDER — LABETALOL HCL 5 MG/ML IV SOLN: 10.0000 mg | INTRAVENOUS | Status: AC | PRN

## 2016-07-12 MED ORDER — HEPARIN SODIUM (PORCINE) 1000 UNIT/ML IJ SOLN
INTRAMUSCULAR | Status: DC | PRN
  Administered 2016-07-12: 4000 [IU] via INTRAVENOUS

## 2016-07-12 MED ORDER — CLOPIDOGREL BISULFATE 75 MG PO TABS
75.0000 mg | ORAL_TABLET | Freq: Every day | ORAL | Status: DC
  Filled 2016-07-12: qty 1

## 2016-07-12 MED ORDER — LOSARTAN POTASSIUM 50 MG PO TABS
50.0000 mg | ORAL_TABLET | Freq: Every day | ORAL | Status: DC
  Administered 2016-07-13: 50 mg via ORAL
  Filled 2016-07-12 (×2): qty 1

## 2016-07-12 MED ORDER — CLOPIDOGREL BISULFATE 75 MG PO TABS
75.0000 mg | ORAL_TABLET | Freq: Every day | ORAL | Status: DC
  Administered 2016-07-13: 75 mg via ORAL
  Filled 2016-07-12: qty 1

## 2016-07-12 MED ORDER — HEPARIN (PORCINE) IN NACL 2-0.9 UNIT/ML-% IJ SOLN
INTRAMUSCULAR | Status: AC
  Filled 2016-07-12: qty 1000

## 2016-07-12 MED ORDER — MIDAZOLAM HCL 2 MG/2ML IJ SOLN
INTRAMUSCULAR | Status: DC | PRN
  Administered 2016-07-12 (×2): 1 mg via INTRAVENOUS

## 2016-07-12 MED ORDER — CLOPIDOGREL BISULFATE 75 MG PO TABS
ORAL_TABLET | ORAL | Status: DC | PRN
Start: 2016-07-12 — End: 2016-07-12
  Administered 2016-07-12: 300 mg via ORAL

## 2016-07-12 MED ORDER — HEPARIN SODIUM (PORCINE) 1000 UNIT/ML IJ SOLN
INTRAMUSCULAR | Status: AC
  Filled 2016-07-12: qty 1

## 2016-07-12 MED ORDER — GI COCKTAIL ~~LOC~~
30.0000 mL | Freq: Four times a day (QID) | ORAL | Status: DC | PRN
  Filled 2016-07-12: qty 30

## 2016-07-12 MED ORDER — SODIUM CHLORIDE 0.9 % WEIGHT BASED INFUSION: 1.0000 mL/kg/h | INTRAVENOUS | Status: DC

## 2016-07-12 MED ORDER — ALPRAZOLAM 0.25 MG PO TABS: 0.2500 mg | ORAL_TABLET | Freq: Two times a day (BID) | ORAL | Status: DC | PRN

## 2016-07-12 MED ORDER — ADENOSINE (DIAGNOSTIC) 140MCG/KG/MIN
INTRAVENOUS | Status: DC | PRN
  Administered 2016-07-12: 140 ug/kg/min via INTRAVENOUS

## 2016-07-12 MED ORDER — HYDRALAZINE HCL 20 MG/ML IJ SOLN: 5.0000 mg | INTRAMUSCULAR | Status: AC | PRN

## 2016-07-12 MED ORDER — SODIUM CHLORIDE 0.9 % IV SOLN
INTRAVENOUS | Status: DC
  Administered 2016-07-12: 01:00:00 via INTRAVENOUS

## 2016-07-12 MED ORDER — ASPIRIN EC 81 MG PO TBEC: 81.0000 mg | DELAYED_RELEASE_TABLET | Freq: Every day | ORAL | Status: DC

## 2016-07-12 MED ORDER — SODIUM CHLORIDE 0.9 % WEIGHT BASED INFUSION
1.0000 mL/kg/h | INTRAVENOUS | Status: DC
  Administered 2016-07-12: 1 mL/kg/h via INTRAVENOUS

## 2016-07-12 MED ORDER — ACETAMINOPHEN 325 MG PO TABS
650.0000 mg | ORAL_TABLET | ORAL | Status: DC | PRN
  Filled 2016-07-12: qty 2

## 2016-07-12 MED ORDER — FENTANYL CITRATE (PF) 100 MCG/2ML IJ SOLN
INTRAMUSCULAR | Status: DC | PRN
  Administered 2016-07-12 (×2): 25 ug via INTRAVENOUS

## 2016-07-12 MED ORDER — ASPIRIN 81 MG PO CHEW
81.0000 mg | CHEWABLE_TABLET | Freq: Every day | ORAL | Status: DC
  Administered 2016-07-13: 81 mg via ORAL
  Filled 2016-07-12 (×3): qty 1

## 2016-07-12 SURGICAL SUPPLY — 16 items
BALLN ~~LOC~~ TREK RX 3.5X15 (BALLOONS) ×2
BALLOON ~~LOC~~ TREK RX 3.5X15 (BALLOONS) ×1 IMPLANT
CATH 5FR JL4 DIAGNOSTIC (CATHETERS) ×2 IMPLANT
CATH INFINITI 5FR ANG PIGTAIL (CATHETERS) ×2 IMPLANT
CATH INFINITI JR4 5F (CATHETERS) ×2 IMPLANT
CATH VISTA GUIDE 6FR XB3.5 (CATHETERS) ×2 IMPLANT
DEVICE CLOSURE MYNXGRIP 6/7F (Vascular Products) ×2 IMPLANT
DEVICE INFLAT 30 PLUS (MISCELLANEOUS) ×2 IMPLANT
KIT MANI 3VAL PERCEP (MISCELLANEOUS) ×2 IMPLANT
NEEDLE PERC 18GX7CM (NEEDLE) ×2 IMPLANT
PACK CARDIAC CATH (CUSTOM PROCEDURE TRAY) ×2 IMPLANT
SHEATH AVANTI 5FR X 11CM (SHEATH) ×2 IMPLANT
SHEATH AVANTI 6FR X 11CM (SHEATH) ×2 IMPLANT
STENT XIENCE ALPINE RX 3.25X18 (Permanent Stent) ×2 IMPLANT
WIRE EMERALD 3MM-J .035X150CM (WIRE) ×4 IMPLANT
WIRE PRESSURE VERRATA (WIRE) ×2 IMPLANT

## 2016-07-12 NOTE — Consult Note (Signed)
[place]  CARDIOLOGY CONSULT NOTE  Patient ID: Sarah Medina MRN: [id number] DOB/AGE: 1965/12/12 51 y.o.  Admit date: 07/11/2016 Referring Physician Dr. Darvin Neighbours Primary Physician   Primary Cardiologist Dr. Saralyn Pilar Reason for Consultation chest pain  HPI:  Patient is a 51 year old female with history of coronary artery disease status post ST elevation myocardial infarction in January 2017. She underwent PCI of the proximal RCA. She had an 80% LAD lesion that was not intervened on. 3 days later had acute closure of the stent and underwent emergent PCI of the RCA. This course was complicated by several VF arrests. The LAD again was noted but not treated. Since that time she has done fairly well. She presented to the emergency room with complaints of chest pain and had what no STEMI criteria. There were T-wave inversions in the anterior leads. She has ruled out for myocardial infarction. Her chest pain has resolved. Pain was somewhat atypical for her angina however.  Review of Systems  Constitutional: Negative.   HENT: Negative.   Eyes: Negative.   Respiratory: Negative.   Cardiovascular: Positive for chest pain.  Gastrointestinal: Negative.   Genitourinary: Negative.   Musculoskeletal: Negative.   Skin: Negative.   Neurological: Negative.   Endo/Heme/Allergies: Negative.   Psychiatric/Behavioral: Negative.     Past Medical History:  Diagnosis Date  . Arthritis    hips, right leg  . CAD in native artery, RCA, LAD and LCX 07/23/2015  . H/O myocardial infarction less than 8 weeks- STEMI inf wall 07/20/15- stent to RCA 07/20/15   inf wall STEMI with DES to RCA  . Hypercholesteremia   . Hypertension   . Periorbital edema    treated with steroids  . Seizures ([place])    as teenager, ages [age].  none since  . STEMI (ST elevation myocardial infarction) ([place]) 07/23/15    Family History  Problem Relation Age of Onset  . Breast cancer Sister  40    Social History   Social History  . Marital status: Legally Separated    Spouse name: N/A  . Number of children: N/A  . Years of education: N/A   Occupational History  . Not on file.   Social History Main Topics  . Smoking status: Never Smoker  . Smokeless tobacco: Former Systems developer    Types: Snuff    Quit date: 2014  . Alcohol use No  . Drug use: Unknown  . Sexual activity: Not on file   Other Topics Concern  . Not on file   Social History Narrative  . No narrative on file    Past Surgical History:  Procedure Laterality Date  . CARDIAC CATHETERIZATION N/A 07/20/2015   Procedure: Left Heart Cath and Coronary Angiography;  Surgeon: Isaias Cowman, MD;  Location: [facility];  Service: Cardiovascular;  Laterality: N/A;  . CARDIAC CATHETERIZATION N/A 07/20/2015   Procedure: Coronary Stent Intervention;  Surgeon: Isaias Cowman, MD;  Location: [facility];  Service: Cardiovascular;  Laterality: N/A;  . CARDIAC CATHETERIZATION N/A 07/23/2015   Procedure: Left Heart Cath and Coronary Angiography;  Surgeon: Lorretta Harp, MD;  Location: [facility];  Service: Cardiovascular;  Laterality: N/A;  . CARDIAC CATHETERIZATION N/A 07/23/2015   Procedure: Coronary Balloon Angioplasty;  Surgeon: Lorretta Harp, MD;  Location: [facility];  Service: Cardiovascular;  Laterality: N/A;  . KNEE SURGERY Right 15 years ago     Prescriptions Prior to Admission  Medication Sig  Dispense Refill Last Dose  . aspirin EC 81 MG tablet Take 81 mg by mouth daily.   07/11/2016 at 1000  . atorvastatin (LIPITOR) 80 MG tablet Take 1 tablet (80 mg total) by mouth daily at 6 PM. (Patient taking differently: Take 40 mg by mouth daily at 6 PM. ) 30 tablet 11 07/10/2016 at 1800  . clopidogrel (PLAVIX) 75 MG tablet Take 1 tablet (75 mg total) by mouth daily. 30 tablet 3 07/11/2016 at 1000  . diclofenac sodium (VOLTAREN) 1 % GEL Apply topically 4 (four) times daily.   07/10/2016 at  Unknown time  . losartan (COZAAR) 50 MG tablet Take 50 mg by mouth daily.   07/11/2016 at 1000  . metoprolol tartrate (LOPRESSOR) 25 MG tablet Take 1 tablet by mouth 2 times daily. 180 tablet 0 07/11/2016 at 1000  . nitroGLYCERIN (NITROSTAT) 0.4 MG SL tablet Place 1 tablet (0.4 mg total) under the tongue every 5 (five) minutes x 3 doses as needed for chest pain. 25 tablet 12 prn at prn    Physical Exam: Blood pressure 137/69, pulse (!) 49, temperature 97.9 F (36.6 C), temperature source Oral, resp. rate 20, height 5\' 6"  (1.676 m), weight 274 lb 6.4 oz (124.5 kg), last menstrual period 05/19/2015, SpO2 100 %.   Wt Readings from Last 1 Encounters:  07/12/16 274 lb 6.4 oz (124.5 kg)     General appearance: alert and cooperative Head: Normocephalic, without obvious abnormality, atraumatic Resp: clear to auscultation bilaterally Chest wall: no tenderness Cardio: regular rate and rhythm GI: soft, non-tender; bowel sounds normal; no masses,  no organomegaly Extremities: extremities normal, atraumatic, no cyanosis or edema Pulses: 2+ and symmetric Neurologic: Grossly normal  Labs:   Lab Results  Component Value Date   WBC 7.0 07/12/2016   HGB 11.0 (L) 07/12/2016   HCT 33.7 (L) 07/12/2016   MCV 83.1 07/12/2016   PLT 252 07/12/2016    Recent Labs Lab 07/12/16 0515  NA 140  K 3.4*  CL 106  CO2 27  BUN 10  CREATININE 0.78  CALCIUM 8.2*  GLUCOSE 99   Lab Results  Component Value Date   TROPONINI <0.03 07/12/2016      Radiology: No acute cardiopulmonary disease EKG: Sinus rhythm with T-wave inversions in the anterior leads. ST elevation in lead 1 and 2.  ASSESSMENT AND PLAN:  Patient is a 51 year old female with history of coronary artery disease status post ST elevation myocardial infarction in January 2017. She underwent PCI of the proximal RCA. She had an 80% LAD lesion that was not intervened on. 3 days later had acute closure of the stent and underwent emergent PCI of the  RCA. This course was complicated by several VF arrests. The LAD again was noted but not treated. Since that time she has done fairly well. She presented to the emergency room with complaints of chest pain and had what no STEMI criteria. There were T-wave inversions in the anterior leads. She ruled out for myocardial infarction given her past history, residual LAD disease and chest pain, we'll proceed with left heart catheter to evaluate coronary anatomy and to reassess the LAD lesion. Signed: Teodoro Spray MD, [facility] 07/12/2016, 8:24 AM

## 2016-07-12 NOTE — Progress Notes (Signed)
1315 Report to [facility] on tele

## 2016-07-12 NOTE — Progress Notes (Signed)
ANTICOAGULATION CONSULT NOTE - Initial Consult  Pharmacy Consult for heparin bolus and drip Indication: chest pain/ACS  Allergies  Allergen Reactions  . Enalapril Swelling    Swelling face    Patient Measurements: Height: 5\' 6"  (167.6 cm) Weight: 274 lb 6.4 oz (124.5 kg) IBW/kg (Calculated) : 59.3 Heparin Dosing Weight: 90 kg   Vital Signs: Temp: 98.1 F (36.7 C) (01/10 0035) Temp Source: Oral (01/10 0035) BP: 157/61 (01/10 0035) Pulse Rate: 51 (01/10 0035)  Labs:  Recent Labs  07/11/16 1724 07/11/16 1752 07/11/16 1850 07/11/16 2349 07/12/16 0221  HGB  --  10.9*  --   --   --   HCT  --  32.6*  --   --   --   PLT  --  279  --   --   --   APTT  --   --  29  --   --   LABPROT  --   --  13.0  --   --   INR  --   --  0.98  --   --   HEPARINUNFRC  --   --   --   --  0.57  CREATININE 0.90  --   --   --   --   TROPONINI <0.03  --   --  <0.03 <0.03    Estimated Creatinine Clearance: 99.7 mL/min (by C-G formula based on SCr of 0.9 mg/dL).   Medical History: Past Medical History:  Diagnosis Date  . Arthritis    hips, right leg  . CAD in native artery, RCA, LAD and LCX 07/23/2015  . H/O myocardial infarction less than 8 weeks- STEMI inf wall 07/20/15- stent to RCA 07/20/15   inf wall STEMI with DES to RCA  . Hypercholesteremia   . Hypertension   . Periorbital edema    treated with steroids  . Seizures ([place])    as teenager, ages [age].  none since  . STEMI (ST elevation myocardial infarction) ([place]) 07/23/15    Medications:  Scheduled:  . aspirin EC  81 mg Oral Daily  . atorvastatin  40 mg Oral q1800  . clopidogrel  75 mg Oral Daily  . losartan  50 mg Oral Daily  . metoprolol tartrate  25 mg Oral BID   Infusions:  . sodium chloride 75 mL/hr at 07/12/16 0101  . heparin 1,150 Units/hr (07/11/16 2036)    Assessment: Pharmacy has been consulted to dose heparin bolus and drip in this 80 yoF with chest pain. Pt has had worsening SOB for three weeks now having  chest pain rated 5/10. Pt takes aspirin and Plavix, denied any anticoagulants prior to admission. Baseline labs have been ordered Anti Xa level ordered  Goal of Therapy:  Heparin level 0.3-0.7 units/ml Monitor platelets by anticoagulation protocol: Yes   Plan:  Give 4000 units bolus x 1 Start heparin infusion at 1150 units/hr Check anti-Xa level in 6 hours and daily while on heparin Continue to monitor H&H and platelets   0110 0230 heparin level 0.57. Continue current regimen and recheck heparin level in 6 hours for confirmation.  Darrow Bussing, PharmD Pharmacy Resident 07/12/2016 4:02 AM

## 2016-07-12 NOTE — Progress Notes (Signed)
Arrival Method: via stretcher with ED RN  Mental Orientation:?A&O Telemetry:?[id number], verified by Zigmund Daniel, NT Skin:?Intact, verified with Bud Face, RN Pain:?no complaints of pain Tubes:?heparin gtt @11 .5, verified by Antonieta Pert, RN Safety Measures: Safety Fall Prevention Plan has been given, discussed & signed, non skid socks in place, patient has been orientated to the room, unit & staff.  Family:?Has been informed of plan of care. Orders have been reviewed & implemented. Will continue to monitor the patient. Call light has been placed within

## 2016-07-12 NOTE — Progress Notes (Signed)
[place] at [place] NAME: Sarah Medina    MR#:  [id number]  DATE OF BIRTH:  02-17-1966  SUBJECTIVE:  CHIEF COMPLAINT:   Chief Complaint  Patient presents with  . Chest Pain   Admitted for chest pain on heparin drip. Cardiac catheterization earlier today with PCI to LAD. No further chest pain   REVIEW OF SYSTEMS:    Review of Systems  Constitutional: Negative for chills and fever.  HENT: Negative for sore throat.   Eyes: Negative for blurred vision, double vision and pain.  Respiratory: Negative for cough, hemoptysis, shortness of breath and wheezing.   Cardiovascular: Negative for chest pain, palpitations, orthopnea and leg swelling.  Gastrointestinal: Negative for abdominal pain, constipation, diarrhea, heartburn, nausea and vomiting.  Genitourinary: Negative for dysuria and hematuria.  Musculoskeletal: Negative for back pain and joint pain.  Skin: Negative for rash.  Neurological: Negative for sensory change, speech change, focal weakness and headaches.  Endo/Heme/Allergies: Does not bruise/bleed easily.  Psychiatric/Behavioral: Negative for depression. The patient is not nervous/anxious.     DRUG ALLERGIES:   Allergies  Allergen Reactions  . Enalapril Swelling    Swelling face    VITALS:  Blood pressure (!) 154/78, pulse (!) 49, temperature 97.9 F (36.6 C), temperature source Oral, resp. rate 14, height 5\' 6"  (1.676 m), weight 124.5 kg (274 lb 6.4 oz), last menstrual period 05/19/2015, SpO2 98 %.  PHYSICAL EXAMINATION:   Physical Exam  GENERAL:  51 y.o.-year-old patient lying in the bed with no acute distress. Obese EYES: Pupils equal, round, reactive to light and accommodation. No scleral icterus. Extraocular muscles intact.  HEENT: Head atraumatic, normocephalic. Oropharynx and nasopharynx clear.  NECK:  Supple, no jugular venous distention. No thyroid enlargement, no tenderness.  LUNGS: Normal breath sounds  bilaterally, no wheezing, rales, rhonchi. No use of accessory muscles of respiration.  CARDIOVASCULAR: S1, S2 normal. No murmurs, rubs, or gallops.  ABDOMEN: Soft, nontender, nondistended. Bowel sounds present. No organomegaly or mass.  EXTREMITIES: No cyanosis, clubbing or edema b/l.    NEUROLOGIC: Cranial nerves II through XII are intact. No focal Motor or sensory deficits b/l.   PSYCHIATRIC: The patient is alert and oriented x 3.  SKIN: No obvious rash, lesion, or ulcer.   LABORATORY PANEL:   CBC  Recent Labs Lab 07/12/16 0515  WBC 7.0  HGB 11.0*  HCT 33.7*  PLT 252   ------------------------------------------------------------------------------------------------------------------ Chemistries   Recent Labs Lab 07/12/16 0515  NA 140  K 3.4*  CL 106  CO2 27  GLUCOSE 99  BUN 10  CREATININE 0.78  CALCIUM 8.2*   ------------------------------------------------------------------------------------------------------------------  Cardiac Enzymes  Recent Labs Lab 07/12/16 0515  TROPONINI <0.03   ------------------------------------------------------------------------------------------------------------------  RADIOLOGY:  Dg Chest 2 View  Result Date: 07/11/2016 CLINICAL DATA:  Left-sided chest pain EXAM: CHEST  2 VIEW COMPARISON:  07/24/2015 FINDINGS: The heart size and mediastinal contours are within normal limits. Both lungs are clear. The visualized skeletal structures are unremarkable. IMPRESSION: No active cardiopulmonary disease. Electronically Signed   By: Donavan Foil M.D.   On: 07/11/2016 18:19     ASSESSMENT AND PLAN:   * Unstable angina Catheterization with PCI to LAD. On aspirin and Plavix.  * Sinus bradycardia Reduced dose of metoprolol.  * Hypertension Home medications  * Chronic anemia stable  Discussed with Dr. Ubaldo Glassing of cardiology.  All the records are reviewed and case discussed with Care Management/Social Workerr. Management plans  discussed with the patient, family  and they are in agreement.  CODE STATUS: FULL CODE  DVT Prophylaxis: SCDs  TOTAL TIME TAKING CARE OF THIS PATIENT: 25 minutes.   POSSIBLE D/C IN 1-2 DAYS, DEPENDING ON CLINICAL CONDITION.  Hillary Bow R M.D on 07/12/2016 at 3:27 PM  Between 7am to 6pm - Pager - [phone]  After 6pm go to [email or web address] - password [email or web address] [place] [facility]  Office  [phone]  CC: Primary care physician; Nino Glow McLean-Scocuzza, MD  Note: This dictation was prepared with Dragon dictation along with smaller phrase technology. Any transcriptional errors that result from this process are unintentional.

## 2016-07-12 NOTE — Care Management (Signed)
Patient is followed at home by [facility].  She was placed in observation for chest pain with negative troponins. Cath resulted in PCI. Spoke with Jerrell Belfast with [facility] and updated agency on patient status.  Would anticipate discharge within 24 hours if remains stable.  Agency will transport

## 2016-07-12 NOTE — Progress Notes (Signed)
ANTICOAGULATION CONSULT NOTE - Initial Consult  Pharmacy Consult for heparin bolus and drip Indication: chest pain/ACS  Allergies  Allergen Reactions  . Enalapril Swelling    Swelling face    Patient Measurements: Height: 5\' 6"  (167.6 cm) Weight: 274 lb 6.4 oz (124.5 kg) IBW/kg (Calculated) : 59.3 Heparin Dosing Weight: 90 kg   Vital Signs: Temp: 97.9 F (36.6 C) (01/10 0839) Temp Source: Oral (01/10 0839) BP: 159/88 (01/10 1300) Pulse Rate: 49 (01/10 1315)  Labs:  Recent Labs  07/11/16 1724 07/11/16 1752 07/11/16 1850 07/11/16 2349 07/12/16 0221 07/12/16 0515 07/12/16 0812  HGB  --  10.9*  --   --   --  11.0*  --   HCT  --  32.6*  --   --   --  33.7*  --   PLT  --  279  --   --   --  252  --   APTT  --   --  29  --   --   --   --   LABPROT  --   --  13.0  --   --  13.9  --   INR  --   --  0.98  --   --  1.07  --   HEPARINUNFRC  --   --   --   --  0.57  --  0.45  CREATININE 0.90  --   --   --   --  0.78  --   TROPONINI <0.03  --   --  <0.03 <0.03 <0.03  --     Estimated Creatinine Clearance: 112.2 mL/min (by C-G formula based on SCr of 0.78 mg/dL).   Medical History: Past Medical History:  Diagnosis Date  . Arthritis    hips, right leg  . CAD in native artery, RCA, LAD and LCX 07/23/2015  . H/O myocardial infarction less than 8 weeks- STEMI inf wall 07/20/15- stent to RCA 07/20/15   inf wall STEMI with DES to RCA  . Hypercholesteremia   . Hypertension   . Periorbital edema    treated with steroids  . Seizures ([place])    as teenager, ages [age].  none since  . STEMI (ST elevation myocardial infarction) ([place]) 07/23/15    Medications:  Scheduled:  . acetaminophen      . aspirin      . aspirin  81 mg Oral Daily  . [MAR Hold] aspirin EC  81 mg Oral Daily  . [MAR Hold] atorvastatin  40 mg Oral q1800  . [MAR Hold] clopidogrel  75 mg Oral Daily  . [START ON 07/13/2016] clopidogrel  75 mg Oral Q breakfast  . [MAR Hold] losartan  50 mg Oral Daily  . [MAR  Hold] metoprolol tartrate  12.5 mg Oral BID  . [MAR Hold] potassium chloride  20 mEq Oral Daily  . [MAR Hold] potassium chloride  40 mEq Oral Once  . sodium chloride flush  3 mL Intravenous Q12H  . sodium chloride flush  3 mL Intravenous Q12H   Infusions:  . sodium chloride 75 mL/hr at 07/12/16 0101  . [START ON 07/13/2016] sodium chloride     Followed by  . [START ON 07/13/2016] sodium chloride    . sodium chloride 1 mL/kg/hr (07/12/16 1235)  . heparin 1,150 Units/hr (07/11/16 2036)    Assessment: Pharmacy has been consulted to dose heparin bolus and drip in this 26 yoF with chest pain. Pt has had worsening SOB for three weeks now having  chest pain rated 5/10. Pt takes aspirin and Plavix, denied any anticoagulants prior to admission. Baseline labs have been ordered Anti Xa level ordered  Goal of Therapy:  Heparin level 0.3-0.7 units/ml Monitor platelets by anticoagulation protocol: Yes   Plan:  Current orders for heparin 1150units/hr. Heparin level therapeutic x 2. Continue current rate, check HL and CBC with AM labs.  Rexene Edison, PharmD, BCPS Clinical Pharmacist  07/12/2016 1:28 PM

## 2016-07-13 LAB — LIPID PANEL
CHOL/HDL RATIO: 3.1 ratio
Cholesterol: 122 mg/dL (ref 0–200)
HDL: 40 mg/dL — ABNORMAL LOW (ref >40)
LDL Cholesterol: 66 mg/dL (ref 0–99)
Triglycerides: 82 mg/dL (ref <150)
VLDL: 16 mg/dL (ref 0–40)

## 2016-07-13 LAB — CBC
HCT: 33.9 % — ABNORMAL LOW (ref 35.0–47.0)
HEMOGLOBIN: 11.2 g/dL — AB (ref 12.0–16.0)
MCH: 26.9 pg (ref 26.0–34.0)
MCHC: 33.1 g/dL (ref 32.0–36.0)
MCV: 81.5 fL (ref 80.0–100.0)
Platelets: 264 10*3/uL (ref 150–440)
RBC: 4.17 MIL/uL (ref 3.80–5.20)
RDW: 15.9 % — ABNORMAL HIGH (ref 11.5–14.5)
WBC: 6.2 10*3/uL (ref 3.6–11.0)

## 2016-07-13 LAB — BASIC METABOLIC PANEL
Anion gap: 6 (ref 5–15)
BUN: 7 mg/dL (ref 6–20)
CHLORIDE: 108 mmol/L (ref 101–111)
CO2: 26 mmol/L (ref 22–32)
CREATININE: 0.62 mg/dL (ref 0.44–1.00)
Calcium: 8.6 mg/dL — ABNORMAL LOW (ref 8.9–10.3)
GFR calc non Af Amer: >60 mL/min (ref >60)
GLUCOSE: 84 mg/dL (ref 65–99)
Potassium: 3.9 mmol/L (ref 3.5–5.1)
Sodium: 140 mmol/L (ref 135–145)

## 2016-07-13 MED ORDER — METOPROLOL TARTRATE 25 MG PO TABS: 12.5000 mg | ORAL_TABLET | Freq: Two times a day (BID) | ORAL | 0 refills | Status: DC

## 2016-07-13 NOTE — Care Management (Signed)
Spoke with Sarah Medina at [facility] and informed she had been mistaken when informed that patient was current with [facility].  Patient is actually in  the referral process. Patient for discharge and does not have transportation home.  Residence is not on the bus route.  Provided taxi voucher.  No other needs.

## 2016-07-13 NOTE — Progress Notes (Signed)
Discharge instructions explained to pt/ verbalized an understanding/ iv and tele removed/ will transport off unit via wheelchair when taxi arrives.

## 2016-07-13 NOTE — Progress Notes (Signed)
[facility]  SUBJECTIVE: no chest pain   Vitals:   07/12/16 1245 07/12/16 1300 07/12/16 1315 07/12/16 1345  BP: (!) 153/84 (!) 159/88  (!) 154/78  Pulse: (!) 52 (!) 47 (!) 49   Resp: 17 15 14    Temp:      TempSrc:      SpO2: 98% 98% 98%   Weight:      Height:        Intake/Output Summary (Last 24 hours) at 07/13/16 0757 Last data filed at 07/12/16 1940  Gross per 24 hour  Intake              240 ml  Output                0 ml  Net              240 ml    LABS: Basic Metabolic Panel:  Recent Labs  07/11/16 1724 07/12/16 0515  NA 141 140  K 4.4 3.4*  CL 108 106  CO2 27 27  GLUCOSE 79 99  BUN 12 10  CREATININE 0.90 0.78  CALCIUM 8.7* 8.2*   Liver Function Tests: No results for input(s): AST, ALT, ALKPHOS, BILITOT, PROT, ALBUMIN in the last 72 hours. No results for input(s): LIPASE, AMYLASE in the last 72 hours. CBC:  Recent Labs  07/12/16 0515 07/13/16 0551  WBC 7.0 6.2  HGB 11.0* 11.2*  HCT 33.7* 33.9*  MCV 83.1 81.5  PLT 252 264   Cardiac Enzymes:  Recent Labs  07/11/16 2349 07/12/16 0221 07/12/16 0515  TROPONINI <0.03 <0.03 <0.03   BNP: Invalid input(s): POCBNP D-Dimer: No results for input(s): DDIMER in the last 72 hours. Hemoglobin A1C: No results for input(s): HGBA1C in the last 72 hours. Fasting Lipid Panel: No results for input(s): CHOL, HDL, LDLCALC, TRIG, CHOLHDL, LDLDIRECT in the last 72 hours. Thyroid Function Tests: No results for input(s): TSH, T4TOTAL, T3FREE, THYROIDAB in the last 72 hours.  Invalid input(s): FREET3 Anemia Panel: No results for input(s): VITAMINB12, FOLATE, FERRITIN, TIBC, IRON, RETICCTPCT in the last 72 hours.   Physical Exam: Blood pressure (!) 154/78, pulse (!) 49, temperature 97.9 F (36.6 C), temperature source Oral, resp. rate 14, height 5\' 6"  (1.676 m), weight 274 lb 6.4 oz (124.5 kg), last menstrual period 05/19/2015, SpO2 98 %.   Wt Readings from Last 1  Encounters:  07/12/16 274 lb 6.4 oz (124.5 kg)     General appearance: alert and cooperative Resp: clear to auscultation bilaterally Chest wall: no tenderness Cardio: regular rate and rhythm GI: soft, non-tender; bowel sounds normal; no masses,  no organomegaly Extremities: cath site moderately tender with no hematoma. Distal pulse 2+ Neurologic: Grossly normal  TELEMETRY: Reviewed telemetry pt in nsr with no dysrrhythmia:  ASSESSMENT AND PLAN:  Active Problems:   Chest pain, rule out acute myocardial infarction-s/p ffr guided pci of lad . Doing well post procedure. Would ambulate on and discharge on , asa 81 mg daily, plavix 75 mg daily, metoprolol 12.5 mg bid, losartan 40 mg daily, and atorvastatin 40 mg daily. Cardiac rehab follow up phase 2 and follow up as outpatient with Dr. Marlou Sa, MD, [facility] 07/13/2016 7:57 AM

## 2016-07-13 NOTE — Discharge Summary (Signed)
[place] [facility] [place] NAME: Sarah Medina    MR#:  [id number]  DATE OF BIRTH:  10-Nov-1965  DATE OF ADMISSION:  07/11/2016 ADMITTING PHYSICIAN: Harvie Bridge, DO  DATE OF DISCHARGE: 07/13/2016  1:07 PM  PRIMARY CARE PHYSICIAN: Nino Glow McLean-Scocuzza, MD   ADMISSION DIAGNOSIS:  Nonspecific chest pain [R07.9]  DISCHARGE DIAGNOSIS:  Active Problems:   Chest pain, rule out acute myocardial infarction   SECONDARY DIAGNOSIS:   Past Medical History:  Diagnosis Date  . Arthritis    hips, right leg  . CAD in native artery, RCA, LAD and LCX 07/23/2015  . H/O myocardial infarction less than 8 weeks- STEMI inf wall 07/20/15- stent to RCA 07/20/15   inf wall STEMI with DES to RCA  . Hypercholesteremia   . Hypertension   . Periorbital edema    treated with steroids  . Seizures ([place])    as teenager, ages [age].  none since  . STEMI (ST elevation myocardial infarction) ([place]) 07/23/15     ADMITTING HISTORY  Chief Complaint: Left shoulder pain  HPI: Sarah Medina is a 51 y.o. female with a known history of ischemic heart disease, coronary artery disease status post MI, STEMI on 07/20/15 with a drug-eluting stent in the RCA, hypertension, hyperlipidemia, seizures was in a usual state of health until the past 3 weeks she has had some mild noticeable shortness of breath, dyspnea on exertion. In the emergency department today with a complaint of 2 days history of intermittent left sided shoulder pain which radiates into her left arm, is described as 5 out of 10 and associated with diaphoresis. She denies nausea, vomiting, palpitations, dizziness, lightheadedness.. She states that the pain occurs at rest and there is nothing that makes it better or worse. She denies any new exercise, physical activity or trauma..   Otherwise there has been no change in status. Patient has been taking medication as prescribed and there has been no recent change in  medication or diet.  No recent antibiotics.  There has been no recent illness, hospitalizations, travel or sick contacts.    Patient denies fevers/chills, weakness, dizziness, chest pain, shortness of breath, N/V/C/D, abdominal pain, dysuria/frequency, changes in mental status.   ED Course:  Patient received nitro x 3 and heparin drip. She took her aspirin and Plavix this morning   HOSPITAL COURSE:   * Unstable angina with PCI to LAD Patient was admitted onto telemetry floor. Started on aspirin, beta blocker, statin. Heparin drip. Had cardiac catheterization with PCI to LAD. With this her chest pain resolved. On day of discharge patient ambulated well without any problems. Discussed with cardiology Dr. Ubaldo Glassing.  * Sinus bradycardia due to beta blockers. Reduce dose of metoprolol in half.  Stable for discharge home to follow-up with cardiology in 1-2 weeks. Cardiac rehabilitation referral done.     CONSULTS OBTAINED:  Treatment Team:  Teodoro Spray, MD  DRUG ALLERGIES:   Allergies  Allergen Reactions  . Enalapril Swelling    Swelling face    DISCHARGE MEDICATIONS:   Discharge Medication List as of 07/13/2016 10:22 AM    CONTINUE these medications which have CHANGED   Details  metoprolol tartrate (LOPRESSOR) 25 MG tablet Take 0.5 tablets (12.5 mg total) by mouth 2 (two) times daily., Starting Thu 07/13/2016, No Print      CONTINUE these medications which have NOT CHANGED   Details  aspirin EC 81 MG tablet Take 81 mg by mouth daily., Historical  Med    atorvastatin (LIPITOR) 80 MG tablet Take 1 tablet (80 mg total) by mouth daily at 6 PM., Starting Tue 07/27/2015, Normal    clopidogrel (PLAVIX) 75 MG tablet Take 1 tablet (75 mg total) by mouth daily., Starting Wed 01/26/2016, Normal    diclofenac sodium (VOLTAREN) 1 % GEL Apply topically 4 (four) times daily., Historical Med    losartan (COZAAR) 50 MG tablet Take 50 mg by mouth daily., Historical Med    nitroGLYCERIN  (NITROSTAT) 0.4 MG SL tablet Place 1 tablet (0.4 mg total) under the tongue every 5 (five) minutes x 3 doses as needed for chest pain., Starting Tue 07/27/2015, Normal        Today   VITAL SIGNS:  Blood pressure (!) 142/62, pulse 61, temperature 97.9 F (36.6 C), temperature source Oral, resp. rate 18, height 5\' 6"  (1.676 m), weight 124.5 kg (274 lb 6.4 oz), last menstrual period 05/19/2015, SpO2 99 %.  I/O:   Intake/Output Summary (Last 24 hours) at 07/13/16 1446 Last data filed at 07/12/16 1940  Gross per 24 hour  Intake              240 ml  Output                0 ml  Net              240 ml    PHYSICAL EXAMINATION:  Physical Exam  GENERAL:  51 y.o.-year-old patient lying in the bed with no acute distress.  LUNGS: Normal breath sounds bilaterally, no wheezing, rales,rhonchi or crepitation. No use of accessory muscles of respiration.  CARDIOVASCULAR: S1, S2 normal. No murmurs, rubs, or gallops.  ABDOMEN: Soft, non-tender, non-distended. Bowel sounds present. No organomegaly or mass.  NEUROLOGIC: Moves all 4 extremities. PSYCHIATRIC: The patient is alert and oriented x 3.  SKIN: No obvious rash, lesion, or ulcer.   DATA REVIEW:   CBC  Recent Labs Lab 07/13/16 0551  WBC 6.2  HGB 11.2*  HCT 33.9*  PLT 264    Chemistries   Recent Labs Lab 07/13/16 0551  NA 140  K 3.9  CL 108  CO2 26  GLUCOSE 84  BUN 7  CREATININE 0.62  CALCIUM 8.6*    Cardiac Enzymes  Recent Labs Lab 07/12/16 0515  TROPONINI <0.03    Microbiology Results  Results for orders placed or performed during the hospital encounter of 07/23/15  MRSA PCR Screening     Status: None   Collection Time: 07/23/15  4:34 PM  Result Value Ref Range Status   MRSA by PCR NEGATIVE NEGATIVE Final    Comment:        The GeneXpert MRSA Assay (FDA approved for NASAL specimens only), is one component of a comprehensive MRSA colonization surveillance program. It is not intended to diagnose  MRSA infection nor to guide or monitor treatment for MRSA infections.     RADIOLOGY:  Dg Chest 2 View  Result Date: 07/11/2016 CLINICAL DATA:  Left-sided chest pain EXAM: CHEST  2 VIEW COMPARISON:  07/24/2015 FINDINGS: The heart size and mediastinal contours are within normal limits. Both lungs are clear. The visualized skeletal structures are unremarkable. IMPRESSION: No active cardiopulmonary disease. Electronically Signed   By: Donavan Foil M.D.   On: 07/11/2016 18:19    Follow up with PCP in 1 week.  Management plans discussed with the patient, family and they are in agreement.  CODE STATUS:     Code Status Orders  Start     Ordered   07/12/16 0035  Full code  Continuous     07/12/16 0034    Code Status History    Date Active Date Inactive Code Status Order ID Comments User Context   07/23/2015  4:33 PM 07/27/2015  6:00 PM Full Code [id number]  Lorretta Harp, MD Inpatient   07/23/2015  2:35 PM 07/23/2015  4:33 PM Full Code [id number]  Isaiah Serge, NP Inpatient   07/20/2015 10:12 AM 07/22/2015  6:24 PM Full Code [id number]  Isaias Cowman, MD Inpatient      TOTAL TIME TAKING CARE OF THIS PATIENT ON DAY OF DISCHARGE: more than 30 minutes.   Hillary Bow R M.D on 07/13/2016 at 2:46 PM  Between 7am to 6pm - Pager - [phone]  After 6pm go to [email or web address] - password [email or web address] [facility]  Office  [phone]  CC: Primary care physician; Nino Glow McLean-Scocuzza, MD  Note: This dictation was prepared with Dragon dictation along with smaller phrase technology. Any transcriptional errors that result from this process are unintentional.

## 2016-07-19 ENCOUNTER — Encounter: Payer: Self-pay | Admitting: *Deleted

## 2016-07-27 ENCOUNTER — Ambulatory Visit: Admission: RE | Admit: 2016-07-27 | Payer: Medicaid Other | Source: Ambulatory Visit | Admitting: Gastroenterology

## 2016-07-27 HISTORY — DX: Unspecified convulsions: R56.9

## 2016-07-27 HISTORY — DX: Unspecified osteoarthritis, unspecified site: M19.90

## 2016-07-27 SURGERY — COLONOSCOPY WITH PROPOFOL
Anesthesia: Choice

## 2016-09-26 ENCOUNTER — Emergency Department
Admission: EM | Admit: 2016-09-26 | Discharge: 2016-09-26 | Disposition: A | Discharge status: Home / Self Care | Payer: Medicaid Other | Attending: Emergency Medicine | Admitting: Emergency Medicine

## 2016-09-26 DIAGNOSIS — I10 Essential (primary) hypertension: Secondary | ICD-10-CM | POA: Insufficient documentation

## 2016-09-26 DIAGNOSIS — J0100 Acute maxillary sinusitis, unspecified: Secondary | ICD-10-CM | POA: Diagnosis not present

## 2016-09-26 DIAGNOSIS — Z87891 Personal history of nicotine dependence: Secondary | ICD-10-CM | POA: Insufficient documentation

## 2016-09-26 DIAGNOSIS — Z7982 Long term (current) use of aspirin: Secondary | ICD-10-CM | POA: Diagnosis not present

## 2016-09-26 DIAGNOSIS — I2510 Atherosclerotic heart disease of native coronary artery without angina pectoris: Secondary | ICD-10-CM | POA: Insufficient documentation

## 2016-09-26 DIAGNOSIS — R0981 Nasal congestion: Secondary | ICD-10-CM | POA: Diagnosis present

## 2016-09-26 DIAGNOSIS — I252 Old myocardial infarction: Secondary | ICD-10-CM | POA: Diagnosis not present

## 2016-09-26 DIAGNOSIS — Z79899 Other long term (current) drug therapy: Secondary | ICD-10-CM | POA: Diagnosis not present

## 2016-09-26 MED ORDER — AMOXICILLIN-POT CLAVULANATE 875-125 MG PO TABS: 1.0000 | ORAL_TABLET | Freq: Two times a day (BID) | ORAL | 0 refills | Status: AC

## 2016-09-26 NOTE — ED Notes (Signed)
Pt states congestion, coughing, sneeze since Sunday. Denies fevers. Denies anything coming up with coughing. C/o sinus pressure. Denies N&V&D. Denies trying OTC decongestants or cough medicine.

## 2016-09-26 NOTE — ED Triage Notes (Signed)
Pt in with co sinus pressure and congestion since Sunday, co non productive cough. Took tylenol at home without relief.

## 2016-09-27 NOTE — ED Provider Notes (Signed)
[facility] Emergency Department Provider Note  ____________________________________________  Time seen: Approximately 12:12 AM  I have reviewed the triage vital signs and the nursing notes.   HISTORY  Chief Complaint Nasal Congestion    HPI Sarah Medina is a 51 y.o. female presenting to the emergency department with maxillary sinus tenderness, congestion and purulent rhinorrhea for approximately 1 week. Patient states that she typically has seasonal allergies. Patient denies shortness of breath, fatigue and purulent sputum production. She has been afebrile. No alleviating measures have been attempted. Patient denies chest pain, chest tightness, nausea, vomiting and abdominal pain.   Past Medical History:  Diagnosis Date  . Arthritis    hips, right leg  . CAD in native artery, RCA, LAD and LCX 07/23/2015  . H/O myocardial infarction less than 8 weeks- STEMI inf wall 07/20/15- stent to RCA 07/20/15   inf wall STEMI with DES to RCA  . Hypercholesteremia   . Hypertension   . Periorbital edema    treated with steroids  . Seizures ([place])    as teenager, ages [age].  none since  . STEMI (ST elevation myocardial infarction) ([place]) 07/23/15    Patient Active Problem List   Diagnosis Date Noted  . Chest pain, rule out acute myocardial infarction 07/11/2016  . Ischemic heart disease 10/20/2015  . Essential hypertension 10/20/2015  . Hyperlipidemia 10/20/2015  . Acute posthemorrhagic anemia   . H/O myocardial infarction less than 8 weeks- STEMI inf wall 07/20/15- stent to RCA 07/23/2015  . CAD in native artery, RCA, LAD and LCX 07/23/2015  . Ventricular fibrillation ([place]) 07/23/2015  . ST elevation myocardial infarction (STEMI) of inferior wall ([place]) 07/20/2015  . Abnormal LFTs 12/23/2014  . Osteoarthritis of right knee 11/24/2014    Past Surgical History:  Procedure Laterality Date  . CARDIAC CATHETERIZATION N/A 07/20/2015   Procedure: Left Heart  Cath and Coronary Angiography;  Surgeon: Isaias Cowman, MD;  Location: [place] [facility];  Service: Cardiovascular;  Laterality: N/A;  . CARDIAC CATHETERIZATION N/A 07/20/2015   Procedure: Coronary Stent Intervention;  Surgeon: Isaias Cowman, MD;  Location: [place] [facility];  Service: Cardiovascular;  Laterality: N/A;  . CARDIAC CATHETERIZATION N/A 07/23/2015   Procedure: Left Heart Cath and Coronary Angiography;  Surgeon: Lorretta Harp, MD;  Location: [place] [facility];  Service: Cardiovascular;  Laterality: N/A;  . CARDIAC CATHETERIZATION N/A 07/23/2015   Procedure: Coronary Balloon Angioplasty;  Surgeon: Lorretta Harp, MD;  Location: [place] [facility];  Service: Cardiovascular;  Laterality: N/A;  . CARDIAC CATHETERIZATION N/A 07/12/2016   Procedure: Left Heart Cath;  Surgeon: Teodoro Spray, MD;  Location: [place] [facility];  Service: Cardiovascular;  Laterality: N/A;  . CARDIAC CATHETERIZATION N/A 07/12/2016   Procedure: Left Heart Cath and Coronary Angiography;  Surgeon: Isaias Cowman, MD;  Location: [place] [facility];  Service: Cardiovascular;  Laterality: N/A;  . CARDIAC CATHETERIZATION N/A 07/12/2016   Procedure: Intravascular Pressure Wire/FFR Study;  Surgeon: Isaias Cowman, MD;  Location: [place] [facility];  Service: Cardiovascular;  Laterality: N/A;  . CARDIAC CATHETERIZATION N/A 07/12/2016   Procedure: Coronary Stent Intervention;  Surgeon: Isaias Cowman, MD;  Location: [place] [facility];  Service: Cardiovascular;  Laterality: N/A;  . KNEE SURGERY Right 15 years ago    Prior to Admission medications   Medication Sig Start Date End Date Taking? Authorizing Provider  amoxicillin-clavulanate (AUGMENTIN) 875-125 MG tablet Take 1 tablet by mouth 2 (two) times daily. 09/26/16 10/06/16  Lannie Fields, PA-C  aspirin EC 81 MG tablet Take 81 mg by mouth daily.    Historical Provider, MD  atorvastatin (LIPITOR) 80 MG tablet Take 1 tablet (80 mg  total) by mouth daily at 6 PM. Patient taking differently: Take 40 mg by mouth daily at 6 PM.  07/27/15   Eileen Stanford, PA-C  clopidogrel (PLAVIX) 75 MG tablet Take 1 tablet (75 mg total) by mouth daily. 01/26/16   Tawni Millers, MD  diclofenac sodium (VOLTAREN) 1 % GEL Apply topically 4 (four) times daily.    Historical Provider, MD  losartan (COZAAR) 50 MG tablet Take 50 mg by mouth daily.    Historical Provider, MD  metoprolol tartrate (LOPRESSOR) 25 MG tablet Take 0.5 tablets (12.5 mg total) by mouth 2 (two) times daily. 07/13/16   Srikar Sudini, MD  nitroGLYCERIN (NITROSTAT) 0.4 MG SL tablet Place 1 tablet (0.4 mg total) under the tongue every 5 (five) minutes x 3 doses as needed for chest pain. 07/27/15   Eileen Stanford, PA-C    Allergies Enalapril  Family History  Problem Relation Age of Onset  . Breast cancer Sister 60    Social History Social History  Substance Use Topics  . Smoking status: Never Smoker  . Smokeless tobacco: Former Systems developer    Types: Snuff    Quit date: 2014  . Alcohol use No     Review of Systems  Constitutional: Patient has had maxillary sinus tenderness. Eyes: No visual changes. No discharge ENT: Patient has had purulent rhinorrhea. Cardiovascular: no chest pain. Respiratory:No SOB. Gastrointestinal: No abdominal pain.  No nausea, no vomiting.  No diarrhea.  No constipation. Musculoskeletal: Negative for musculoskeletal pain. Skin: Negative for rash, abrasions, lacerations, ecchymosis. Neurological: Negative for headaches, focal weakness or numbness. ____________________________________________   PHYSICAL EXAM:  VITAL SIGNS: ED Triage Vitals  Enc Vitals Group     BP 09/26/16 2045 (!) 151/75     Pulse Rate 09/26/16 2045 65     Resp 09/26/16 2045 18     Temp 09/26/16 2045 98.6 F (37 C)     Temp Source 09/26/16 2045 Oral     SpO2 09/26/16 2045 100 %     Weight 09/26/16 2046 274 lb (124.3 kg)     Height 09/26/16 2046 5\' 6"  (1.676 m)      Head Circumference --      Peak Flow --      Pain Score 09/26/16 2045 0     Pain Loc --      Pain Edu? --      Excl. in [place]? --      Constitutional: Alert and oriented. Patient is lying supine in bed.  Eyes: Conjunctivae are normal. PERRL. EOMI. Head: Atraumatic. ENT:      Ears: Tympanic membranes are pearly bilaterally without evidence of effusion or purulent exudate. Bony landmarks are visualized bilaterally. No pain with palpation at the tragus.      Nose: Nasal turbinates are edematous and erythematous. Trace rhinorrhea visualized.      Mouth/Throat: Mucous membranes are moist. Posterior pharynx is mildly erythematous. No tonsillar hypertrophy or purulent exudate. Uvula is midline. Neck: Full range of motion. No pain is elicited with flexion at the neck. Hematological/Lymphatic/Immunilogical: No cervical lymphadenopathy. Cardiovascular: Normal rate, regular rhythm. Normal S1 and S2.  Good peripheral circulation. Respiratory: Normal respiratory effort without tachypnea or retractions. Lungs CTAB. Good air entry to the bases with no decreased or absent breath sounds. Gastrointestinal: Bowel sounds  4 quadrants. Soft and nontender to palpation. No guarding or rigidity. No palpable masses. No distention. No CVA tenderness.  Skin:  Skin is warm, dry and intact. No rash noted. Psychiatric: Mood and affect are normal. Speech and behavior are normal. Patient exhibits appropriate insight and judgement.   ____________________________________________   LABS (all labs ordered are listed, but only abnormal results are displayed)  Labs Reviewed - No data to display ____________________________________________  EKG   ____________________________________________  RADIOLOGY   No results found.  ____________________________________________    PROCEDURES  Procedure(s) performed:    Procedures    Medications - No data to  display   ____________________________________________   INITIAL IMPRESSION / ASSESSMENT AND PLAN / ED COURSE  Pertinent labs & imaging results that were available during my care of the patient were reviewed by me and considered in my medical decision making (see chart for details).  Review of the [place] CSRS was performed in accordance of the [place] prior to dispensing any controlled drugs.     Assessment and plan: Acute Sinusitis  Patient presents to the emergency department with maxillary sinus tenderness and purulent sputum production for the past week. Symptoms are consistent with acute sinusitis. Patient was discharged with Augmentin. Vital signs are reassuring at this time. All patient questions were answered.   ____________________________________________  FINAL CLINICAL IMPRESSION(S) / ED DIAGNOSES  Final diagnoses:  Acute non-recurrent maxillary sinusitis      NEW MEDICATIONS STARTED DURING THIS VISIT:  Discharge Medication List as of 09/26/2016 10:53 PM    START taking these medications   Details  amoxicillin-clavulanate (AUGMENTIN) 875-125 MG tablet Take 1 tablet by mouth 2 (two) times daily., Starting Tue 09/26/2016, Until Fri 10/06/2016, Print            This chart was dictated using voice recognition software/Dragon. Despite best efforts to proofread, errors can occur which can change the meaning. Any change was purely unintentional.    Lannie Fields, PA-C 09/27/16 0017    Orbie Pyo, MD 09/27/16 [phone]

## 2016-10-27 ENCOUNTER — Other Ambulatory Visit: Payer: Self-pay | Admitting: Internal Medicine

## 2016-10-27 DIAGNOSIS — Z1231 Encounter for screening mammogram for malignant neoplasm of breast: Secondary | ICD-10-CM

## 2016-11-15 ENCOUNTER — Ambulatory Visit: Payer: Medicaid Other

## 2016-11-22 ENCOUNTER — Ambulatory Visit
Admission: RE | Admit: 2016-11-22 | Discharge: 2016-11-22 | Disposition: A | Discharge status: Home / Self Care | Payer: Medicaid Other | Source: Ambulatory Visit | Attending: Internal Medicine | Admitting: Internal Medicine

## 2016-11-22 DIAGNOSIS — Z1231 Encounter for screening mammogram for malignant neoplasm of breast: Secondary | ICD-10-CM | POA: Insufficient documentation

## 2016-12-11 ENCOUNTER — Emergency Department
Admission: EM | Admit: 2016-12-11 | Discharge: 2016-12-11 | Disposition: A | Discharge status: Home / Self Care | Payer: Medicaid Other | Attending: Emergency Medicine | Admitting: Emergency Medicine

## 2016-12-11 ENCOUNTER — Encounter: Payer: Self-pay | Admitting: Emergency Medicine

## 2016-12-11 ENCOUNTER — Emergency Department: Payer: Medicaid Other

## 2016-12-11 DIAGNOSIS — M25461 Effusion, right knee: Secondary | ICD-10-CM | POA: Insufficient documentation

## 2016-12-11 DIAGNOSIS — Z79899 Other long term (current) drug therapy: Secondary | ICD-10-CM | POA: Insufficient documentation

## 2016-12-11 DIAGNOSIS — Y998 Other external cause status: Secondary | ICD-10-CM | POA: Insufficient documentation

## 2016-12-11 DIAGNOSIS — Z7902 Long term (current) use of antithrombotics/antiplatelets: Secondary | ICD-10-CM | POA: Diagnosis not present

## 2016-12-11 DIAGNOSIS — W010XXA Fall on same level from slipping, tripping and stumbling without subsequent striking against object, initial encounter: Secondary | ICD-10-CM | POA: Insufficient documentation

## 2016-12-11 DIAGNOSIS — I2510 Atherosclerotic heart disease of native coronary artery without angina pectoris: Secondary | ICD-10-CM | POA: Insufficient documentation

## 2016-12-11 DIAGNOSIS — S8991XA Unspecified injury of right lower leg, initial encounter: Secondary | ICD-10-CM | POA: Diagnosis present

## 2016-12-11 DIAGNOSIS — I252 Old myocardial infarction: Secondary | ICD-10-CM | POA: Diagnosis not present

## 2016-12-11 DIAGNOSIS — Y929 Unspecified place or not applicable: Secondary | ICD-10-CM | POA: Diagnosis not present

## 2016-12-11 DIAGNOSIS — I10 Essential (primary) hypertension: Secondary | ICD-10-CM | POA: Insufficient documentation

## 2016-12-11 DIAGNOSIS — Z7982 Long term (current) use of aspirin: Secondary | ICD-10-CM | POA: Insufficient documentation

## 2016-12-11 DIAGNOSIS — Y9389 Activity, other specified: Secondary | ICD-10-CM | POA: Insufficient documentation

## 2016-12-11 MED ORDER — DICLOFENAC SODIUM 1 % TD GEL: 4.0000 g | Freq: Four times a day (QID) | TRANSDERMAL | 0 refills | Status: AC

## 2016-12-11 NOTE — Discharge Instructions (Signed)
Follow up with your primary care provider for symptoms that are not improving over the week. Return to the emergency department for symptoms that change or worsen if you're unable schedule an appointment.

## 2016-12-11 NOTE — ED Provider Notes (Signed)
[facility] Emergency Department Provider Note ____________________________________________  Time seen: Approximately 10:08 PM  I have reviewed the triage vital signs and the nursing notes.   HISTORY  Chief Complaint Leg Swelling and Leg Pain    HPI Sarah Medina is a 51 y.o. female who presents to the emergency department for evaluation of right knee pain. She reports a mechanical, non-syncopal fall last Monday and landed on her right leg. She has not taken any medications to help with this pain.  Past Medical History:  Diagnosis Date  . Arthritis    hips, right leg  . CAD in native artery, RCA, LAD and LCX 07/23/2015  . H/O myocardial infarction less than 8 weeks- STEMI inf wall 07/20/15- stent to RCA 07/20/15   inf wall STEMI with DES to RCA  . Hypercholesteremia   . Hypertension   . Periorbital edema    treated with steroids  . Seizures ([place])    as teenager, ages [age].  none since  . STEMI (ST elevation myocardial infarction) ([place]) 07/23/15    Patient Active Problem List   Diagnosis Date Noted  . Chest pain, rule out acute myocardial infarction 07/11/2016  . Ischemic heart disease 10/20/2015  . Essential hypertension 10/20/2015  . Hyperlipidemia 10/20/2015  . Acute posthemorrhagic anemia   . H/O myocardial infarction less than 8 weeks- STEMI inf wall 07/20/15- stent to RCA 07/23/2015  . CAD in native artery, RCA, LAD and LCX 07/23/2015  . Ventricular fibrillation ([place]) 07/23/2015  . ST elevation myocardial infarction (STEMI) of inferior wall ([place]) 07/20/2015  . Abnormal LFTs 12/23/2014  . Osteoarthritis of right knee 11/24/2014    Past Surgical History:  Procedure Laterality Date  . CARDIAC CATHETERIZATION N/A 07/20/2015   Procedure: Left Heart Cath and Coronary Angiography;  Surgeon: Isaias Cowman, MD;  Location: [place] [facility];  Service: Cardiovascular;  Laterality: N/A;  . CARDIAC CATHETERIZATION N/A 07/20/2015   Procedure: Coronary Stent Intervention;  Surgeon: Isaias Cowman, MD;  Location: [place] [facility];  Service: Cardiovascular;  Laterality: N/A;  . CARDIAC CATHETERIZATION N/A 07/23/2015   Procedure: Left Heart Cath and Coronary Angiography;  Surgeon: Lorretta Harp, MD;  Location: [place] [facility];  Service: Cardiovascular;  Laterality: N/A;  . CARDIAC CATHETERIZATION N/A 07/23/2015   Procedure: Coronary Balloon Angioplasty;  Surgeon: Lorretta Harp, MD;  Location: [place] [facility];  Service: Cardiovascular;  Laterality: N/A;  . CARDIAC CATHETERIZATION N/A 07/12/2016   Procedure: Left Heart Cath;  Surgeon: Teodoro Spray, MD;  Location: [place] [facility];  Service: Cardiovascular;  Laterality: N/A;  . CARDIAC CATHETERIZATION N/A 07/12/2016   Procedure: Left Heart Cath and Coronary Angiography;  Surgeon: Isaias Cowman, MD;  Location: [place] [facility];  Service: Cardiovascular;  Laterality: N/A;  . CARDIAC CATHETERIZATION N/A 07/12/2016   Procedure: Intravascular Pressure Wire/FFR Study;  Surgeon: Isaias Cowman, MD;  Location: [place] [facility];  Service: Cardiovascular;  Laterality: N/A;  . CARDIAC CATHETERIZATION N/A 07/12/2016   Procedure: Coronary Stent Intervention;  Surgeon: Isaias Cowman, MD;  Location: [place] [facility];  Service: Cardiovascular;  Laterality: N/A;  . KNEE SURGERY Right 15 years ago    Prior to Admission medications   Medication Sig Start Date End Date Taking? Authorizing Provider  aspirin EC 81 MG tablet Take 81 mg by mouth daily.    [provider]  atorvastatin (LIPITOR) 80 MG tablet Take 1 tablet (80 mg total) by mouth daily at 6 PM. Patient  taking differently: Take 40 mg by mouth daily at 6 PM.  07/27/15   Eileen Stanford, PA-C  clopidogrel (PLAVIX) 75 MG tablet Take 1 tablet (75 mg total) by mouth daily. 01/26/16   Tawni Millers, MD  diclofenac sodium (VOLTAREN) 1 % GEL Apply 4 g topically 4 (four) times daily.  12/11/16 12/25/16  Zaidan Keeble, Johnette Abraham B, FNP  losartan (COZAAR) 50 MG tablet Take 50 mg by mouth daily.    [provider]  metoprolol tartrate (LOPRESSOR) 25 MG tablet Take 0.5 tablets (12.5 mg total) by mouth 2 (two) times daily. 07/13/16   Hillary Bow, MD  nitroGLYCERIN (NITROSTAT) 0.4 MG SL tablet Place 1 tablet (0.4 mg total) under the tongue every 5 (five) minutes x 3 doses as needed for chest pain. 07/27/15   Eileen Stanford, PA-C    Allergies Enalapril  Family History  Problem Relation Age of Onset  . Breast cancer Sister 48    Social History Social History  Substance Use Topics  . Smoking status: Never Smoker  . Smokeless tobacco: Former Systems developer    Types: Snuff    Quit date: 2014  . Alcohol use No    Review of Systems Constitutional: Positive for recent fall. Cardiovascular: Negative for chest pain or palpitations Respiratory: Negative for shortness of breath. Musculoskeletal: Positive for right knee pain. Skin: Negative for rash, lesion, or wound.  Neurological: Negative for decrease in sensation or paresthesias.  ____________________________________________   PHYSICAL EXAM:  VITAL SIGNS: ED Triage Vitals  Enc Vitals Group     BP 12/11/16 2120 132/74     Pulse Rate 12/11/16 2120 82     Resp 12/11/16 2120 16     Temp 12/11/16 2120 98.3 F (36.8 C)     Temp Source 12/11/16 2120 Oral     SpO2 12/11/16 2120 99 %     Weight 12/11/16 2117 274 lb (124.3 kg)     Height --      Head Circumference --      Peak Flow --      Pain Score --      Pain Loc --      Pain Edu? --      Excl. in [place]? --     Constitutional: Alert and oriented. Well appearing and in no acute distress. Eyes: Conjunctivae are clear without drainage.  Head: Atraumatic. Neck: Nexus Criteria is negative. Respiratory: Respirations are even and unlabored. Musculoskeletal: Tenderness over the patella. Negative Ballottement test. Active, range of motion of the right knee. No obvious  deformity. Neurologic: No radicular pain on exam.  Skin: Warm, dry, intact  ____________________________________________   LABS (all labs ordered are listed, but only abnormal results are displayed)  Labs Reviewed - No data to display ____________________________________________  RADIOLOGY  Moderate arthritis of the right knee with small suprapatellar effusion. No acute osseous abnormality. ____________________________________________   PROCEDURES  Procedure(s) performed: None  ____________________________________________   INITIAL IMPRESSION / ASSESSMENT AND PLAN / ED COURSE  Sarah Medina is a 51 y.o. female who presents to the emergency department for evaluation of right knee pain. X-ray is negative for acute bony abnormality. She will be prescribed Voltaren gel and encouraged to follow up with the PCP or orthopedic specialist for symptoms that are not improving over the next few days. She was encouraged to return to the ER for symptoms that change or worsen if unable to schedule an appointment.  Pertinent labs & imaging results that were available during my care  of the patient were reviewed by me and considered in my medical decision making (see chart for details).  _________________________________________   FINAL CLINICAL IMPRESSION(S) / ED DIAGNOSES  Final diagnoses:  Knee effusion, right    Discharge Medication List as of 12/11/2016 11:21 PM      If controlled substance prescribed during this visit, 12 month history viewed on the [place] prior to issuing an initial prescription for Schedule II or III opiod.    Victorino Dike, FNP 12/13/16 1946    Orbie Pyo, MD 12/16/16 [phone]

## 2016-12-11 NOTE — ED Triage Notes (Signed)
Pt ambulatory to front desk with slow gait, pt placed in wheelchair. Pt reports mechanical fall last Monday, landing on the right leg. Pt to ED today for right leg pain and swelling. No swelling noted on assessment.

## 2017-01-20 DIAGNOSIS — Z5321 Procedure and treatment not carried out due to patient leaving prior to being seen by health care provider: Secondary | ICD-10-CM | POA: Diagnosis not present

## 2017-01-20 DIAGNOSIS — R1030 Lower abdominal pain, unspecified: Secondary | ICD-10-CM | POA: Diagnosis not present

## 2017-01-20 LAB — COMPREHENSIVE METABOLIC PANEL
ALT: 12 U/L — AB (ref 14–54)
AST: 24 U/L (ref 15–41)
Albumin: 4 g/dL (ref 3.5–5.0)
Alkaline Phosphatase: 84 U/L (ref 38–126)
Anion gap: 11 (ref 5–15)
BILIRUBIN TOTAL: 0.5 mg/dL (ref 0.3–1.2)
BUN: 14 mg/dL (ref 6–20)
CHLORIDE: 103 mmol/L (ref 101–111)
CO2: 26 mmol/L (ref 22–32)
CREATININE: 0.91 mg/dL (ref 0.44–1.00)
Calcium: 9.3 mg/dL (ref 8.9–10.3)
GFR calc Af Amer: >60 mL/min (ref >60)
Glucose, Bld: 87 mg/dL (ref 65–99)
Potassium: 3.8 mmol/L (ref 3.5–5.1)
Sodium: 140 mmol/L (ref 135–145)
Total Protein: 8.2 g/dL — ABNORMAL HIGH (ref 6.5–8.1)

## 2017-01-20 LAB — URINALYSIS, COMPLETE (UACMP) WITH MICROSCOPIC
BILIRUBIN URINE: NEGATIVE
Glucose, UA: NEGATIVE mg/dL
Hgb urine dipstick: NEGATIVE
Ketones, ur: NEGATIVE mg/dL
Nitrite: NEGATIVE
PH: 6 (ref 5.0–8.0)
Protein, ur: NEGATIVE mg/dL
SPECIFIC GRAVITY, URINE: 1.019 (ref 1.005–1.030)

## 2017-01-20 LAB — CBC
HCT: 39.2 % (ref 35.0–47.0)
Hemoglobin: 12.7 g/dL (ref 12.0–16.0)
MCH: 26.7 pg (ref 26.0–34.0)
MCHC: 32.4 g/dL (ref 32.0–36.0)
MCV: 82.4 fL (ref 80.0–100.0)
PLATELETS: 324 10*3/uL (ref 150–440)
RBC: 4.76 MIL/uL (ref 3.80–5.20)
RDW: 16 % — AB (ref 11.5–14.5)
WBC: 8.2 10*3/uL (ref 3.6–11.0)

## 2017-01-20 LAB — LIPASE, BLOOD: LIPASE: 38 U/L (ref 11–51)

## 2017-01-20 NOTE — ED Triage Notes (Signed)
Patient reports mid lower abdominal pain for approximately a month, worse today.  Denies nausea, vomiting, diarrhea or urinary symptoms.

## 2017-01-21 ENCOUNTER — Emergency Department
Admission: EM | Admit: 2017-01-21 | Discharge: 2017-01-21 | Disposition: A | Discharge status: Home / Self Care | Payer: Medicaid Other | Attending: Emergency Medicine | Admitting: Emergency Medicine

## 2017-01-22 ENCOUNTER — Telehealth: Payer: Self-pay | Admitting: Emergency Medicine

## 2017-01-22 NOTE — Telephone Encounter (Signed)
Called patient due to lwot to inquire about condition and follow up plans. Number disconnected 

## 2017-01-25 ENCOUNTER — Emergency Department
Admission: EM | Admit: 2017-01-25 | Discharge: 2017-01-25 | Disposition: A | Discharge status: Home / Self Care | Payer: Medicaid Other | Attending: Student in an Organized Health Care Education/Training Program | Admitting: Student in an Organized Health Care Education/Training Program

## 2017-01-25 ENCOUNTER — Encounter: Payer: Self-pay | Admitting: Emergency Medicine

## 2017-01-25 ENCOUNTER — Emergency Department: Payer: Medicaid Other

## 2017-01-25 DIAGNOSIS — R102 Pelvic and perineal pain: Secondary | ICD-10-CM | POA: Insufficient documentation

## 2017-01-25 DIAGNOSIS — B9689 Other specified bacterial agents as the cause of diseases classified elsewhere: Secondary | ICD-10-CM | POA: Diagnosis not present

## 2017-01-25 DIAGNOSIS — Z79899 Other long term (current) drug therapy: Secondary | ICD-10-CM | POA: Diagnosis not present

## 2017-01-25 DIAGNOSIS — I2510 Atherosclerotic heart disease of native coronary artery without angina pectoris: Secondary | ICD-10-CM | POA: Insufficient documentation

## 2017-01-25 DIAGNOSIS — N760 Acute vaginitis: Secondary | ICD-10-CM | POA: Diagnosis not present

## 2017-01-25 DIAGNOSIS — I10 Essential (primary) hypertension: Secondary | ICD-10-CM | POA: Insufficient documentation

## 2017-01-25 DIAGNOSIS — Z7982 Long term (current) use of aspirin: Secondary | ICD-10-CM | POA: Insufficient documentation

## 2017-01-25 DIAGNOSIS — G8929 Other chronic pain: Secondary | ICD-10-CM | POA: Diagnosis not present

## 2017-01-25 LAB — CBC
HCT: 40 % (ref 35.0–47.0)
HEMOGLOBIN: 12.9 g/dL (ref 12.0–16.0)
MCH: 26.8 pg (ref 26.0–34.0)
MCHC: 32.2 g/dL (ref 32.0–36.0)
MCV: 83.4 fL (ref 80.0–100.0)
PLATELETS: 295 10*3/uL (ref 150–440)
RBC: 4.8 MIL/uL (ref 3.80–5.20)
RDW: 16.2 % — ABNORMAL HIGH (ref 11.5–14.5)
WBC: 7.7 10*3/uL (ref 3.6–11.0)

## 2017-01-25 LAB — URINALYSIS, COMPLETE (UACMP) WITH MICROSCOPIC
BILIRUBIN URINE: NEGATIVE
GLUCOSE, UA: NEGATIVE mg/dL
Hgb urine dipstick: NEGATIVE
KETONES UR: NEGATIVE mg/dL
LEUKOCYTES UA: NEGATIVE
Nitrite: NEGATIVE
PH: 6 (ref 5.0–8.0)
PROTEIN: NEGATIVE mg/dL
RBC / HPF: NONE SEEN RBC/hpf (ref 0–5)
Specific Gravity, Urine: 1.017 (ref 1.005–1.030)

## 2017-01-25 LAB — WET PREP, GENITAL
Sperm: NONE SEEN
Trich, Wet Prep: NONE SEEN
Yeast Wet Prep HPF POC: NONE SEEN

## 2017-01-25 LAB — CHLAMYDIA/NGC RT PCR (ARMC ONLY)
Chlamydia Tr: NOT DETECTED
N GONORRHOEAE: NOT DETECTED

## 2017-01-25 LAB — COMPREHENSIVE METABOLIC PANEL
ALK PHOS: 86 U/L (ref 38–126)
ALT: 11 U/L — AB (ref 14–54)
ANION GAP: 8 (ref 5–15)
AST: 21 U/L (ref 15–41)
Albumin: 3.9 g/dL (ref 3.5–5.0)
BUN: 10 mg/dL (ref 6–20)
CHLORIDE: 104 mmol/L (ref 101–111)
CO2: 25 mmol/L (ref 22–32)
Calcium: 9.2 mg/dL (ref 8.9–10.3)
Creatinine, Ser: 0.82 mg/dL (ref 0.44–1.00)
GFR calc non Af Amer: >60 mL/min (ref >60)
GLUCOSE: 83 mg/dL (ref 65–99)
Potassium: 4.2 mmol/L (ref 3.5–5.1)
SODIUM: 137 mmol/L (ref 135–145)
Total Bilirubin: 0.6 mg/dL (ref 0.3–1.2)
Total Protein: 7.7 g/dL (ref 6.5–8.1)

## 2017-01-25 LAB — LIPASE, BLOOD: LIPASE: 32 U/L (ref 11–51)

## 2017-01-25 MED ORDER — METRONIDAZOLE 250 MG PO TABS: 250.0000 mg | ORAL_TABLET | Freq: Three times a day (TID) | ORAL | 0 refills | Status: AC

## 2017-01-25 MED ORDER — ONDANSETRON HCL 4 MG PO TABS: 4.0000 mg | ORAL_TABLET | Freq: Every day | ORAL | 0 refills | Status: AC | PRN

## 2017-01-25 NOTE — ED Notes (Signed)
This Probation officer and [place], EDT both attempted to draw labs without success.

## 2017-01-25 NOTE — ED Provider Notes (Signed)
[facility] Emergency Department Provider Note    None    (approximate)  I have reviewed the triage vital signs and the nursing notes.   HISTORY  Chief Complaint Abdominal Pain    HPI Sarah Medina is a 51 y.o. female presents with over one month of suprapubic pressure and discomfort. Describes it as crampy and pressure-like pain. Patient had a endometrial biopsy for vaginal bleeding and states the pain has been persistent since then. Is having normal bowel movements. No dysuria. Denies any vaginal discharge. States the pain is intermittently on the left side. We'll adamantly have diarrhea mixed with constipation. Denies any chest pain or shortness of breath. No flank pain.  Past Medical History:  Diagnosis Date  . Arthritis    hips, right leg  . CAD in native artery, RCA, LAD and LCX 07/23/2015  . H/O myocardial infarction less than 8 weeks- STEMI inf wall 07/20/15- stent to RCA 07/20/15   inf wall STEMI with DES to RCA  . Hypercholesteremia   . Hypertension   . Periorbital edema    treated with steroids  . Seizures ([place])    as teenager, ages [age].  none since  . STEMI (ST elevation myocardial infarction) ([place]) 07/23/15   Family History  Problem Relation Age of Onset  . Breast cancer Sister 41   Past Surgical History:  Procedure Laterality Date  . CARDIAC CATHETERIZATION N/A 07/20/2015   Procedure: Left Heart Cath and Coronary Angiography;  Surgeon: Isaias Cowman, MD;  Location: [place] [facility];  Service: Cardiovascular;  Laterality: N/A;  . CARDIAC CATHETERIZATION N/A 07/20/2015   Procedure: Coronary Stent Intervention;  Surgeon: Isaias Cowman, MD;  Location: [place] [facility];  Service: Cardiovascular;  Laterality: N/A;  . CARDIAC CATHETERIZATION N/A 07/23/2015   Procedure: Left Heart Cath and Coronary Angiography;  Surgeon: Lorretta Harp, MD;  Location: [place] [facility];  Service: Cardiovascular;  Laterality: N/A;    . CARDIAC CATHETERIZATION N/A 07/23/2015   Procedure: Coronary Balloon Angioplasty;  Surgeon: Lorretta Harp, MD;  Location: [place] [facility];  Service: Cardiovascular;  Laterality: N/A;  . CARDIAC CATHETERIZATION N/A 07/12/2016   Procedure: Left Heart Cath;  Surgeon: Teodoro Spray, MD;  Location: [place] [facility];  Service: Cardiovascular;  Laterality: N/A;  . CARDIAC CATHETERIZATION N/A 07/12/2016   Procedure: Left Heart Cath and Coronary Angiography;  Surgeon: Isaias Cowman, MD;  Location: [place] [facility];  Service: Cardiovascular;  Laterality: N/A;  . CARDIAC CATHETERIZATION N/A 07/12/2016   Procedure: Intravascular Pressure Wire/FFR Study;  Surgeon: Isaias Cowman, MD;  Location: [place] [facility];  Service: Cardiovascular;  Laterality: N/A;  . CARDIAC CATHETERIZATION N/A 07/12/2016   Procedure: Coronary Stent Intervention;  Surgeon: Isaias Cowman, MD;  Location: [place] [facility];  Service: Cardiovascular;  Laterality: N/A;  . KNEE SURGERY Right 15 years ago   Patient Active Problem List   Diagnosis Date Noted  . Chest pain, rule out acute myocardial infarction 07/11/2016  . Ischemic heart disease 10/20/2015  . Essential hypertension 10/20/2015  . Hyperlipidemia 10/20/2015  . Acute posthemorrhagic anemia   . H/O myocardial infarction less than 8 weeks- STEMI inf wall 07/20/15- stent to RCA 07/23/2015  . CAD in native artery, RCA, LAD and LCX 07/23/2015  . Ventricular fibrillation ([place]) 07/23/2015  . ST elevation myocardial infarction (STEMI) of inferior wall ([place]) 07/20/2015  . Abnormal LFTs 12/23/2014  . Osteoarthritis of right knee 11/24/2014  Prior to Admission medications   Medication Sig Start Date End Date Taking? Authorizing Provider  aspirin EC 81 MG tablet Take 81 mg by mouth daily.    [provider]  atorvastatin (LIPITOR) 80 MG tablet Take 1 tablet (80 mg total) by mouth daily at 6 PM. Patient taking differently: Take 40  mg by mouth daily at 6 PM.  07/27/15   Eileen Stanford, PA-C  clopidogrel (PLAVIX) 75 MG tablet Take 1 tablet (75 mg total) by mouth daily. 01/26/16   Tawni Millers, MD  losartan (COZAAR) 50 MG tablet Take 50 mg by mouth daily.    [provider]  metoprolol tartrate (LOPRESSOR) 25 MG tablet Take 0.5 tablets (12.5 mg total) by mouth 2 (two) times daily. 07/13/16   Hillary Bow, MD  metroNIDAZOLE (FLAGYL) 250 MG tablet Take 1 tablet (250 mg total) by mouth 3 (three) times daily. 01/25/17 02/01/17  Merlyn Lot, MD  nitroGLYCERIN (NITROSTAT) 0.4 MG SL tablet Place 1 tablet (0.4 mg total) under the tongue every 5 (five) minutes x 3 doses as needed for chest pain. 07/27/15   Eileen Stanford, PA-C  ondansetron (ZOFRAN) 4 MG tablet Take 1 tablet (4 mg total) by mouth daily as needed for nausea or vomiting. 01/25/17 01/25/18  Merlyn Lot, MD    Allergies Enalapril    Social History Social History  Substance Use Topics  . Smoking status: Never Smoker  . Smokeless tobacco: Former Systems developer    Types: Snuff    Quit date: 2014  . Alcohol use No    Review of Systems Patient denies headaches, rhinorrhea, blurry vision, numbness, shortness of breath, chest pain, edema, cough, abdominal pain, nausea, vomiting, diarrhea, dysuria, fevers, rashes or hallucinations unless otherwise stated above in HPI. ____________________________________________   PHYSICAL EXAM:  VITAL SIGNS: Vitals:   01/25/17 1435 01/25/17 1826  BP: (!) 161/51 (!) 159/85  Pulse: (!) 50 (!) 43  Resp: 18 19  Temp: 98.6 F (37 C)     Constitutional: Alert and oriented. Well appearing and in no acute distress. Eyes: Conjunctivae are normal.  Head: Atraumatic. Nose: No congestion/rhinnorhea. Mouth/Throat: Mucous membranes are moist.   Neck: No stridor. Painless ROM.  Cardiovascular: Normal rate, regular rhythm. Grossly normal heart sounds.  Good peripheral circulation. Respiratory: Normal respiratory  effort.  No retractions. Lungs CTAB. Gastrointestinal: Soft mild suprapubic discomfort without rebound or guarding No distention. No abdominal bruits. No CVA tenderness. Musculoskeletal: No lower extremity tenderness nor edema.  No joint effusions. Neurologic:  Normal speech and language. No gross focal neurologic deficits are appreciated. No facial droop Skin:  Skin is warm, dry and intact. No rash noted. Psychiatric: Mood and affect are normal. Speech and behavior are normal.  ____________________________________________   LABS (all labs ordered are listed, but only abnormal results are displayed)  Results for orders placed or performed during the hospital encounter of 01/25/17 (from the past 24 hour(s))  Urinalysis, Complete w Microscopic     Status: Abnormal   Collection Time: 01/25/17  2:48 PM  Result Value Ref Range   Color, Urine YELLOW (A) YELLOW   APPearance CLEAR (A) CLEAR   Specific Gravity, Urine 1.017 1.005 - 1.030   pH 6.0 5.0 - 8.0   Glucose, UA NEGATIVE NEGATIVE mg/dL   Hgb urine dipstick NEGATIVE NEGATIVE   Bilirubin Urine NEGATIVE NEGATIVE   Ketones, ur NEGATIVE NEGATIVE mg/dL   Protein, ur NEGATIVE NEGATIVE mg/dL   Nitrite NEGATIVE NEGATIVE   Leukocytes, UA NEGATIVE NEGATIVE  RBC / HPF NONE SEEN 0 - 5 RBC/hpf   WBC, UA 0-5 0 - 5 WBC/hpf   Bacteria, UA RARE (A) NONE SEEN   Squamous Epithelial / LPF 0-5 (A) NONE SEEN   Mucous PRESENT   Lipase, blood     Status: None   Collection Time: 01/25/17  3:17 PM  Result Value Ref Range   Lipase 32 11 - 51 U/L  Comprehensive metabolic panel     Status: Abnormal   Collection Time: 01/25/17  3:17 PM  Result Value Ref Range   Sodium 137 135 - 145 mmol/L   Potassium 4.2 3.5 - 5.1 mmol/L   Chloride 104 101 - 111 mmol/L   CO2 25 22 - 32 mmol/L   Glucose, Bld 83 65 - 99 mg/dL   BUN 10 6 - 20 mg/dL   Creatinine, Ser 0.82 0.44 - 1.00 mg/dL   Calcium 9.2 8.9 - 10.3 mg/dL   Total Protein 7.7 6.5 - 8.1 g/dL   Albumin 3.9  3.5 - 5.0 g/dL   AST 21 15 - 41 U/L   ALT 11 (L) 14 - 54 U/L   Alkaline Phosphatase 86 38 - 126 U/L   Total Bilirubin 0.6 0.3 - 1.2 mg/dL   GFR calc non Af Amer >60 >60 mL/min   GFR calc Af Amer >60 >60 mL/min   Anion gap 8 5 - 15  CBC     Status: Abnormal   Collection Time: 01/25/17  3:17 PM  Result Value Ref Range   WBC 7.7 3.6 - 11.0 K/uL   RBC 4.80 3.80 - 5.20 MIL/uL   Hemoglobin 12.9 12.0 - 16.0 g/dL   HCT 40.0 35.0 - 47.0 %   MCV 83.4 80.0 - 100.0 fL   MCH 26.8 26.0 - 34.0 pg   MCHC 32.2 32.0 - 36.0 g/dL   RDW 16.2 (H) 11.5 - 14.5 %   Platelets 295 150 - 440 K/uL  Chlamydia/NGC rt PCR ([facility] only)     Status: None   Collection Time: 01/25/17  5:09 PM  Result Value Ref Range   Specimen source GC/Chlam URINE, RANDOM    Chlamydia Tr NOT DETECTED NOT DETECTED   N gonorrhoeae NOT DETECTED NOT DETECTED  Wet prep, genital     Status: Abnormal   Collection Time: 01/25/17  5:09 PM  Result Value Ref Range   Yeast Wet Prep HPF POC NONE SEEN NONE SEEN   Trich, Wet Prep NONE SEEN NONE SEEN   Clue Cells Wet Prep HPF POC PRESENT (A) NONE SEEN   WBC, Wet Prep HPF POC MANY (A) NONE SEEN   Sperm NONE SEEN    ____________________________________________  ____________________________________________  RADIOLOGY  I personally reviewed all radiographic images ordered to evaluate for the above acute complaints and reviewed radiology reports and findings.  These findings were personally discussed with the patient.  Please see medical record for radiology report.  ____________________________________________   PROCEDURES  Procedure(s) performed:  Procedures    Critical Care performed: no ____________________________________________   INITIAL IMPRESSION / ASSESSMENT AND PLAN / ED COURSE  Pertinent labs & imaging results that were available during my care of the patient were reviewed by me and considered in my medical decision making (see chart for details).  DDX: uti, abscess,  colitis, diverticulitis, vaginosis  Sarah Medina is a 51 y.o. who presents to the ED with Suprapubic abdominal pain as described above. CT imaging ordered for the above differential shows no evidence of abscess, colitis  or diverticulitis. There is no evidence of acute appendicitis. No evidence of UTI. Clinically is not consistent with PID she has no white count and no fever. Patient does have evidence of BV on wet prep. It certainly explain her discomfort. Patient will be started on Flagyl. At this point I do feel the patient will be appropriate for further workup as an outpatient.  Patient was able to tolerate PO and was able to ambulate with a steady gait.  Have discussed with the patient and available family all diagnostics and treatments performed thus far and all questions were answered to the best of my ability. The patient demonstrates understanding and agreement with plan.       ____________________________________________   FINAL CLINICAL IMPRESSION(S) / ED DIAGNOSES  Final diagnoses:  Chronic suprapubic pain  Bacterial vaginosis      NEW MEDICATIONS STARTED DURING THIS VISIT:  Discharge Medication List as of 01/25/2017  5:58 PM    START taking these medications   Details  metroNIDAZOLE (FLAGYL) 250 MG tablet Take 1 tablet (250 mg total) by mouth 3 (three) times daily., Starting Thu 01/25/2017, Until Thu 02/01/2017, Print    ondansetron (ZOFRAN) 4 MG tablet Take 1 tablet (4 mg total) by mouth daily as needed for nausea or vomiting., Starting Thu 01/25/2017, Until Fri 01/25/2018, Print         Note:  This document was prepared using Dragon voice recognition software and may include unintentional dictation errors.    Merlyn Lot, MD 01/25/17 2215

## 2017-01-25 NOTE — Discharge Instructions (Signed)

## 2017-01-25 NOTE — ED Triage Notes (Signed)
Pt reports lower abdominal pain for three weeks. Denies NVD. Denies fever. Pt seen here 01/21/17 for the same.

## 2017-03-26 ENCOUNTER — Other Ambulatory Visit: Payer: Self-pay | Admitting: Internal Medicine

## 2017-03-26 DIAGNOSIS — R2232 Localized swelling, mass and lump, left upper limb: Secondary | ICD-10-CM

## 2017-03-29 ENCOUNTER — Ambulatory Visit
Admission: RE | Admit: 2017-03-29 | Discharge: 2017-03-29 | Disposition: A | Discharge status: Home / Self Care | Payer: Medicaid Other | Source: Ambulatory Visit | Attending: Internal Medicine | Admitting: Internal Medicine

## 2017-03-29 DIAGNOSIS — R2232 Localized swelling, mass and lump, left upper limb: Secondary | ICD-10-CM | POA: Diagnosis not present

## 2017-07-20 ENCOUNTER — Emergency Department
Admission: EM | Admit: 2017-07-20 | Discharge: 2017-07-20 | Disposition: A | Discharge status: Home / Self Care | Payer: Medicaid Other | Attending: Emergency Medicine | Admitting: Emergency Medicine

## 2017-07-20 DIAGNOSIS — Z7982 Long term (current) use of aspirin: Secondary | ICD-10-CM | POA: Diagnosis not present

## 2017-07-20 DIAGNOSIS — Z7901 Long term (current) use of anticoagulants: Secondary | ICD-10-CM | POA: Insufficient documentation

## 2017-07-20 DIAGNOSIS — I10 Essential (primary) hypertension: Secondary | ICD-10-CM | POA: Insufficient documentation

## 2017-07-20 DIAGNOSIS — I252 Old myocardial infarction: Secondary | ICD-10-CM | POA: Diagnosis not present

## 2017-07-20 DIAGNOSIS — N3001 Acute cystitis with hematuria: Secondary | ICD-10-CM | POA: Insufficient documentation

## 2017-07-20 DIAGNOSIS — I2510 Atherosclerotic heart disease of native coronary artery without angina pectoris: Secondary | ICD-10-CM | POA: Diagnosis not present

## 2017-07-20 DIAGNOSIS — Z79899 Other long term (current) drug therapy: Secondary | ICD-10-CM | POA: Diagnosis not present

## 2017-07-20 DIAGNOSIS — Z955 Presence of coronary angioplasty implant and graft: Secondary | ICD-10-CM | POA: Insufficient documentation

## 2017-07-20 DIAGNOSIS — M545 Low back pain: Secondary | ICD-10-CM | POA: Diagnosis present

## 2017-07-20 LAB — URINALYSIS, COMPLETE (UACMP) WITH MICROSCOPIC
BILIRUBIN URINE: NEGATIVE
Bacteria, UA: NONE SEEN
Glucose, UA: NEGATIVE mg/dL
Ketones, ur: NEGATIVE mg/dL
Nitrite: NEGATIVE
PH: 5 (ref 5.0–8.0)
Protein, ur: 30 mg/dL — AB
Specific Gravity, Urine: 1.015 (ref 1.005–1.030)

## 2017-07-20 MED ORDER — SULFAMETHOXAZOLE-TRIMETHOPRIM 800-160 MG PO TABS: 1.0000 | ORAL_TABLET | Freq: Two times a day (BID) | ORAL | 0 refills | Status: DC

## 2017-07-20 NOTE — ED Notes (Signed)
See triage note presents with urinary freq and lower back pain

## 2017-07-20 NOTE — ED Notes (Signed)
Pt to POV in wheelchair with husband. VSS. NAD. Discharge instructions, RX and follow reviewed with patient. All questions answered.

## 2017-07-20 NOTE — ED Triage Notes (Signed)
Pt states that she was standing in the bathroom on Wednesday and felt a "catch".  Pt states that her lower back has been hurting ever since.  Pt states that she does have some burning when she pees which also started on Wednesday.  Pt states she has never had a pain like this before.  Pt states she took tylenol yesterday that relieved the pain, but nothing today.  Pt is A&Ox4, in NAD.

## 2017-07-20 NOTE — ED Provider Notes (Signed)
[facility] Emergency Department Provider Note ____________________________________________  Time seen: Approximately 1:54 PM  I have reviewed the triage vital signs and the nursing notes.   HISTORY  Chief Complaint Back Pain    HPI Sarah Medina is a 52 y.o. female who presents to the emergency department for treatment and evaluation of back pain and dysuria.  Patient started 2 days ago.  She denies injury.  She denies frequent urinary tract infections.  She states she took Tylenol yesterday that helped with the pain, but pain returned today.  She has not taken any medicines for her symptoms prior to arrival.  Past Medical History:  Diagnosis Date  . Arthritis    hips, right leg  . CAD in native artery, RCA, LAD and LCX 07/23/2015  . H/O myocardial infarction less than 8 weeks- STEMI inf wall 07/20/15- stent to RCA 07/20/15   inf wall STEMI with DES to RCA  . Hypercholesteremia   . Hypertension   . Periorbital edema    treated with steroids  . Seizures ([place])    as teenager, ages [age].  none since  . STEMI (ST elevation myocardial infarction) ([place]) 07/23/15    Patient Active Problem List   Diagnosis Date Noted  . Chest pain, rule out acute myocardial infarction 07/11/2016  . Ischemic heart disease 10/20/2015  . Essential hypertension 10/20/2015  . Hyperlipidemia 10/20/2015  . Acute posthemorrhagic anemia   . H/O myocardial infarction less than 8 weeks- STEMI inf wall 07/20/15- stent to RCA 07/23/2015  . CAD in native artery, RCA, LAD and LCX 07/23/2015  . Ventricular fibrillation ([place]) 07/23/2015  . ST elevation myocardial infarction (STEMI) of inferior wall ([place]) 07/20/2015  . Abnormal LFTs 12/23/2014  . Osteoarthritis of right knee 11/24/2014    Past Surgical History:  Procedure Laterality Date  . CARDIAC CATHETERIZATION N/A 07/20/2015   Procedure: Left Heart Cath and Coronary Angiography;  Surgeon: Isaias Cowman, MD;  Location:  [place] [facility];  Service: Cardiovascular;  Laterality: N/A;  . CARDIAC CATHETERIZATION N/A 07/20/2015   Procedure: Coronary Stent Intervention;  Surgeon: Isaias Cowman, MD;  Location: [place] [facility];  Service: Cardiovascular;  Laterality: N/A;  . CARDIAC CATHETERIZATION N/A 07/23/2015   Procedure: Left Heart Cath and Coronary Angiography;  Surgeon: Lorretta Harp, MD;  Location: [place] [facility];  Service: Cardiovascular;  Laterality: N/A;  . CARDIAC CATHETERIZATION N/A 07/23/2015   Procedure: Coronary Balloon Angioplasty;  Surgeon: Lorretta Harp, MD;  Location: [place] [facility];  Service: Cardiovascular;  Laterality: N/A;  . CARDIAC CATHETERIZATION N/A 07/12/2016   Procedure: Left Heart Cath;  Surgeon: Teodoro Spray, MD;  Location: [place] [facility];  Service: Cardiovascular;  Laterality: N/A;  . CARDIAC CATHETERIZATION N/A 07/12/2016   Procedure: Left Heart Cath and Coronary Angiography;  Surgeon: Isaias Cowman, MD;  Location: [place] [facility];  Service: Cardiovascular;  Laterality: N/A;  . CARDIAC CATHETERIZATION N/A 07/12/2016   Procedure: Intravascular Pressure Wire/FFR Study;  Surgeon: Isaias Cowman, MD;  Location: [place] [facility];  Service: Cardiovascular;  Laterality: N/A;  . CARDIAC CATHETERIZATION N/A 07/12/2016   Procedure: Coronary Stent Intervention;  Surgeon: Isaias Cowman, MD;  Location: [place] [facility];  Service: Cardiovascular;  Laterality: N/A;  . KNEE SURGERY Right 15 years ago    Prior to Admission medications   Medication Sig Start Date End Date Taking? Authorizing Provider  aspirin EC 81 MG tablet Take 81 mg by mouth daily.  [provider]  atorvastatin (LIPITOR) 80 MG tablet Take 1 tablet (80 mg total) by mouth daily at 6 PM. Patient taking differently: Take 40 mg by mouth daily at 6 PM.  07/27/15   Eileen Stanford, PA-C  clopidogrel (PLAVIX) 75 MG tablet Take 1 tablet (75 mg total) by mouth daily.  01/26/16   Tawni Millers, MD  losartan (COZAAR) 50 MG tablet Take 50 mg by mouth daily.    [provider]  metoprolol tartrate (LOPRESSOR) 25 MG tablet Take 0.5 tablets (12.5 mg total) by mouth 2 (two) times daily. 07/13/16   Hillary Bow, MD  nitroGLYCERIN (NITROSTAT) 0.4 MG SL tablet Place 1 tablet (0.4 mg total) under the tongue every 5 (five) minutes x 3 doses as needed for chest pain. 07/27/15   Eileen Stanford, PA-C  ondansetron (ZOFRAN) 4 MG tablet Take 1 tablet (4 mg total) by mouth daily as needed for nausea or vomiting. 01/25/17 01/25/18  Merlyn Lot, MD  sulfamethoxazole-trimethoprim (BACTRIM DS,SEPTRA DS) 800-160 MG tablet Take 1 tablet by mouth 2 (two) times daily. 07/20/17   Edithe Dobbin, Johnette Abraham B, FNP    Allergies Enalapril  Family History  Problem Relation Age of Onset  . Breast cancer Sister 63    Social History Social History   Tobacco Use  . Smoking status: Never Smoker  . Smokeless tobacco: Former Systems developer    Types: Snuff  Substance Use Topics  . Alcohol use: No  . Drug use: Not on file    Review of Systems Constitutional: Negative for fever. Cardiovascular: Negative for chest pain. Respiratory: Negative for shortness of breath. Musculoskeletal: Positive for lower back pain. Skin: Negative for rash, lesion, or wound. Neurological: Negative for paresthesias.  ____________________________________________   PHYSICAL EXAM:  VITAL SIGNS: ED Triage Vitals  Enc Vitals Group     BP 07/20/17 1138 107/74     Pulse Rate 07/20/17 1138 88     Resp 07/20/17 1138 18     Temp 07/20/17 1138 98.6 F (37 C)     Temp Source 07/20/17 1138 Oral     SpO2 07/20/17 1138 97 %     Weight 07/20/17 1138 280 lb (127 kg)     Height --      Head Circumference --      Peak Flow --      Pain Score 07/20/17 1137 8     Pain Loc --      Pain Edu? --      Excl. in [place]? --     Constitutional: Alert and oriented. Well appearing and in no acute distress. Eyes:  Conjunctivae are clear without discharge or drainage Head: Atraumatic Neck: Supple Respiratory: Respirations even and unlabored. Musculoskeletal: Pain is reproducible with percussion over the lumbar area.  Straight leg raise is negative.  Patient is able to transition from sitting to standing without assistance.  Stable gait is observed. Neurologic: Awake, alert, and oriented Skin: Intact Psychiatric: Behavior and affect are appropriate.  ____________________________________________   LABS (all labs ordered are listed, but only abnormal results are displayed)  Labs Reviewed  URINALYSIS, COMPLETE (UACMP) WITH MICROSCOPIC - Abnormal; Notable for the following components:      Result Value   Color, Urine YELLOW (*)    APPearance TURBID (*)    Hgb urine dipstick SMALL (*)    Protein, ur 30 (*)    Leukocytes, UA LARGE (*)    Squamous Epithelial / LPF 6-30 (*)    Non Squamous Epithelial 0-5 (*)  All other components within normal limits   ____________________________________________  RADIOLOGY  Not indicated ____________________________________________   PROCEDURES  Procedures  ____________________________________________   INITIAL IMPRESSION / ASSESSMENT AND PLAN / ED COURSE  Sarah Medina is a 52 y.o. female who presents to the emergency department for evaluation.  Urinalysis is consistent with acute cystitis.  She will be placed on Bactrim for the next 3 days.  She was also advised to continue taking the Tylenol.  She was instructed to follow-up with her primary care provider for symptoms that are not improving with this medication.  She was instructed to return to the emergency department for symptoms change or worsen if she is unable to schedule an appointment.  Medications - No data to display  Pertinent labs & imaging results that were available during my care of the patient were reviewed by me and considered in my medical decision making (see chart for  details).  _________________________________________   FINAL CLINICAL IMPRESSION(S) / ED DIAGNOSES  Final diagnoses:  Acute cystitis with hematuria    ED Discharge Orders        Ordered    sulfamethoxazole-trimethoprim (BACTRIM DS,SEPTRA DS) 800-160 MG tablet  2 times daily     07/20/17 1248       If controlled substance prescribed during this visit, 12 month history viewed on the [facility] prior to issuing an initial prescription for Schedule II or III opiod.    Victorino Dike, FNP 07/20/17 1357    Harvest Dark, MD 07/20/17 1436

## 2017-09-14 ENCOUNTER — Other Ambulatory Visit: Payer: Self-pay | Admitting: Internal Medicine

## 2017-09-14 DIAGNOSIS — Z1231 Encounter for screening mammogram for malignant neoplasm of breast: Secondary | ICD-10-CM

## 2017-11-27 ENCOUNTER — Ambulatory Visit
Admission: RE | Admit: 2017-11-27 | Discharge: 2017-11-27 | Disposition: A | Discharge status: Home / Self Care | Payer: Medicaid Other | Source: Ambulatory Visit | Attending: Internal Medicine | Admitting: Internal Medicine

## 2017-11-27 DIAGNOSIS — Z1231 Encounter for screening mammogram for malignant neoplasm of breast: Secondary | ICD-10-CM

## 2018-01-09 ENCOUNTER — Ambulatory Visit: Admission: RE | Admit: 2018-01-09 | Payer: Medicare Other | Source: Ambulatory Visit | Admitting: Internal Medicine

## 2018-01-09 ENCOUNTER — Encounter: Admission: RE | Payer: Self-pay | Source: Ambulatory Visit

## 2018-01-09 SURGERY — COLONOSCOPY WITH PROPOFOL
Anesthesia: General

## 2018-02-13 ENCOUNTER — Other Ambulatory Visit
Admission: RE | Admit: 2018-02-13 | Discharge: 2018-02-13 | Disposition: A | Discharge status: Home / Self Care | Payer: Medicare Other | Source: Ambulatory Visit | Attending: Internal Medicine | Admitting: Internal Medicine

## 2018-04-09 ENCOUNTER — Encounter: Payer: Self-pay | Admitting: *Deleted

## 2018-04-10 ENCOUNTER — Ambulatory Visit
Admission: RE | Admit: 2018-04-10 | Discharge: 2018-04-10 | Disposition: A | Discharge status: Home / Self Care | Payer: Medicare Other | Source: Ambulatory Visit | Attending: Internal Medicine | Admitting: Internal Medicine

## 2018-04-10 ENCOUNTER — Encounter
Admission: RE | Disposition: A | Discharge status: Home / Self Care | Payer: Self-pay | Source: Ambulatory Visit | Attending: Internal Medicine

## 2018-04-10 ENCOUNTER — Ambulatory Visit: Payer: Medicare Other | Admitting: Certified Registered"

## 2018-04-10 ENCOUNTER — Encounter: Payer: Self-pay | Admitting: *Deleted

## 2018-04-10 HISTORY — DX: Occlusion and stenosis of unspecified carotid artery: I65.29

## 2018-04-10 SURGERY — COLONOSCOPY WITH PROPOFOL
Anesthesia: General

## 2018-04-10 MED ORDER — MIDAZOLAM HCL 2 MG/2ML IJ SOLN
INTRAMUSCULAR | Status: AC
  Filled 2018-04-10: qty 2

## 2018-04-10 MED ORDER — LIDOCAINE HCL (PF) 2 % IJ SOLN
INTRAMUSCULAR | Status: AC
  Filled 2018-04-10: qty 10

## 2018-04-10 MED ORDER — SODIUM CHLORIDE 0.9 % IV SOLN: INTRAVENOUS | Status: DC

## 2018-04-10 MED ORDER — PROPOFOL 500 MG/50ML IV EMUL
INTRAVENOUS | Status: AC
  Filled 2018-04-10: qty 50

## 2018-04-10 MED ORDER — PROPOFOL 10 MG/ML IV BOLUS
INTRAVENOUS | Status: AC
  Filled 2018-04-10: qty 20

## 2018-04-10 NOTE — Interval H&P Note (Signed)
History and Physical Interval Note:  04/10/2018 1:50 PM  Sarah Medina  has presented today for surgery, with the diagnosis of CCA  The various methods of treatment have been discussed with the patient and family. After consideration of risks, benefits and other options for treatment, the patient has consented to  Procedure(s): COLONOSCOPY WITH PROPOFOL (N/A) as a surgical intervention .  The patient's history has been reviewed, patient examined, no change in status, stable for surgery.  I have reviewed the patient's chart and labs.  Questions were answered to the patient's satisfaction.     [place], [place]

## 2018-04-10 NOTE — H&P (Signed)
Outpatient short stay form Pre-procedure 04/10/2018 1:47 PM Sarah Medina K. Alice Reichert, M.D.  Primary Physician: Lamonte Sakai, M.D.  Reason for visit:  Colon cancer screening  History of present illness: Patient with a hx of NSTEMI s/p mid-LAD stent placement in 07/2016 presents for colonoscopy. Patient takes Plavix but has not taken it in the last 5 days in preparation for colonoscopy.     Current Facility-Administered Medications:  .  0.9 %  sodium chloride infusion, , Intravenous, Continuous, Rodney Yera, Benay Pike, MD  Medications Prior to Admission  Medication Sig Dispense Refill Last Dose  . aspirin EC 81 MG tablet Take 81 mg by mouth daily.   04/09/2018 at Unknown time  . atorvastatin (LIPITOR) 80 MG tablet Take 1 tablet (80 mg total) by mouth daily at 6 PM. (Patient taking differently: Take 40 mg by mouth daily at 6 PM. ) 30 tablet 11 04/09/2018 at Unknown time  . cholecalciferol (VITAMIN D) 400 units TABS tablet Take 50,000 Units by mouth 7 days.   Past Week at Unknown time  . fluticasone (FLONASE) 50 MCG/ACT nasal spray Place 1 spray into both nostrils daily.   Past Week at Unknown time  . hydrochlorothiazide (HYDRODIURIL) 12.5 MG tablet Take 12.5 mg by mouth daily.   04/09/2018 at Unknown time  . loratadine (CLARITIN) 10 MG tablet Take 10 mg by mouth daily.   04/09/2018 at Unknown time  . losartan (COZAAR) 50 MG tablet Take 50 mg by mouth daily.   04/09/2018 at Unknown time  . medroxyPROGESTERone (PROVERA) 10 MG tablet Take 10 mg by mouth daily.   04/09/2018 at Unknown time  . metoprolol tartrate (LOPRESSOR) 25 MG tablet Take 0.5 tablets (12.5 mg total) by mouth 2 (two) times daily. 180 tablet 0 04/09/2018 at 2100  . ranitidine (ZANTAC) 150 MG tablet Take 150 mg by mouth 2 (two) times daily.   Past Week at Unknown time  . clopidogrel (PLAVIX) 75 MG tablet Take 1 tablet (75 mg total) by mouth daily. 30 tablet 3 04/04/2018  . nitroGLYCERIN (NITROSTAT) 0.4 MG SL tablet Place 1 tablet (0.4 mg total) under  the tongue every 5 (five) minutes x 3 doses as needed for chest pain. 25 tablet 12 prn at prn  . sulfamethoxazole-trimethoprim (BACTRIM DS,SEPTRA DS) 800-160 MG tablet Take 1 tablet by mouth 2 (two) times daily. (Patient not taking: Reported on 04/10/2018) 6 tablet 0 Completed Course at Unknown time     Allergies  Allergen Reactions  . Enalapril Swelling    Swelling face     Past Medical History:  Diagnosis Date  . Arthritis    hips, right leg  . CAD in native artery, RCA, LAD and LCX 07/23/2015  . Carotid artery occlusion   . H/O myocardial infarction less than 8 weeks- STEMI inf wall 07/20/15- stent to RCA 07/20/15   inf wall STEMI with DES to RCA  . Hypercholesteremia   . Hypertension   . Periorbital edema    treated with steroids  . Seizures ([place])    as teenager, ages [age].  none since  . STEMI (ST elevation myocardial infarction) ([place]) 07/23/15    Review of systems:  Otherwise negative.    Physical Exam  Gen: Alert, oriented. Appears stated age.  HEENT: /AT. PERRLA. Lungs: CTA, no wheezes. CV: RR nl S1, S2. Abd: soft, benign, no masses. BS+ Ext: No edema. Pulses 2+    Planned procedures: Proceed with colonoscopy. The patient understands the nature of the planned procedure, indications, risks, alternatives and  potential complications including but not limited to bleeding, infection, perforation, damage to internal organs and possible oversedation/side effects from anesthesia. The patient agrees and gives consent to proceed.  Please refer to procedure notes for findings, recommendations and patient disposition/instructions.     Sarah Medina K. Alice Reichert, M.D. Gastroenterology 04/10/2018  1:47 PM

## 2018-04-10 NOTE — Anesthesia Preprocedure Evaluation (Addendum)
Anesthesia Evaluation  Patient identified by MRN, date of birth, ID band Patient awake    Reviewed: Allergy & Precautions, NPO status , Patient's Chart, lab work & pertinent test results  Airway Mallampati: II  TM Distance: <3 FB     Dental  (+) Teeth Intact   Pulmonary neg pulmonary ROS,    Pulmonary exam normal        Cardiovascular hypertension, + CAD, + Past MI and + Peripheral Vascular Disease  Normal cardiovascular exam     Neuro/Psych Seizures -, Well Controlled,  negative psych ROS   GI/Hepatic negative GI ROS, Neg liver ROS,   Endo/Other  negative endocrine ROS  Renal/GU negative Renal ROS     Musculoskeletal  (+) Arthritis , Osteoarthritis,    Abdominal Normal abdominal exam  (+)   Peds negative pediatric ROS (+)  Hematology  (+) anemia ,   Anesthesia Other Findings   Reproductive/Obstetrics                            Anesthesia Physical Anesthesia Plan  ASA: III  Anesthesia Plan: General   Post-op Pain Management:    Induction: Intravenous  PONV Risk Score and Plan: Propofol infusion  Airway Management Planned: Nasal Cannula  Additional Equipment:   Intra-op Plan:   Post-operative Plan:   Informed Consent: I have reviewed the patients History and Physical, chart, labs and discussed the procedure including the risks, benefits and alternatives for the proposed anesthesia with the patient or authorized representative who has indicated his/her understanding and acceptance.   Dental advisory given  Plan Discussed with: CRNA and Surgeon  Anesthesia Plan Comments:         Anesthesia Quick Evaluation

## 2018-04-16 ENCOUNTER — Ambulatory Visit: Admit: 2018-04-16 | Payer: Medicare Other | Admitting: Ophthalmology

## 2018-04-16 SURGERY — PHACOEMULSIFICATION, CATARACT, WITH IOL INSERTION
Anesthesia: Choice | Laterality: Right

## 2018-05-15 IMAGING — US US EXTREM LOW VENOUS*L*
1 series · 13 of 24 positions shown · non-contrast
Comparison: None.

CLINICAL DATA: Left lower extremity chronic edema for 3 years.



[Series 1: us extrem low venous*left* · 0.08mm/px · 13 of 34 slices shown]
[im 1/34]
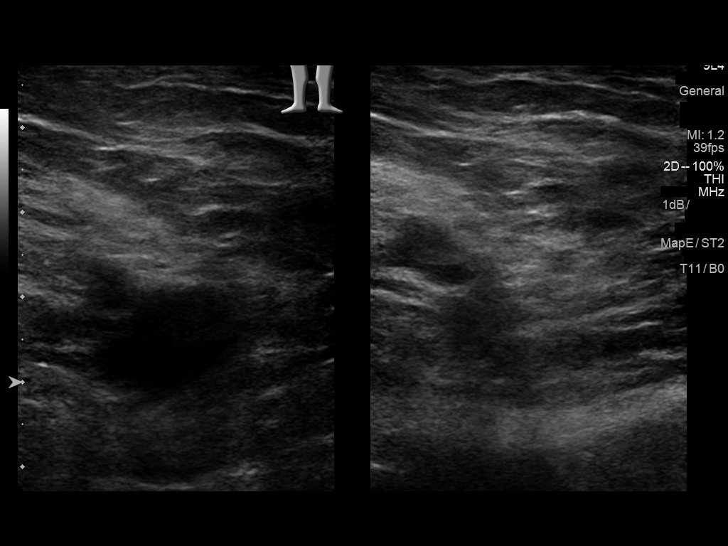
[im 3/34]
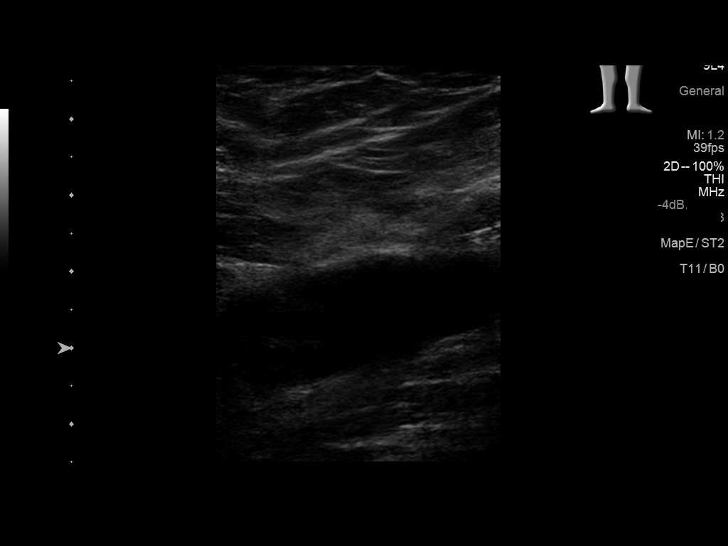
[im 6/34]
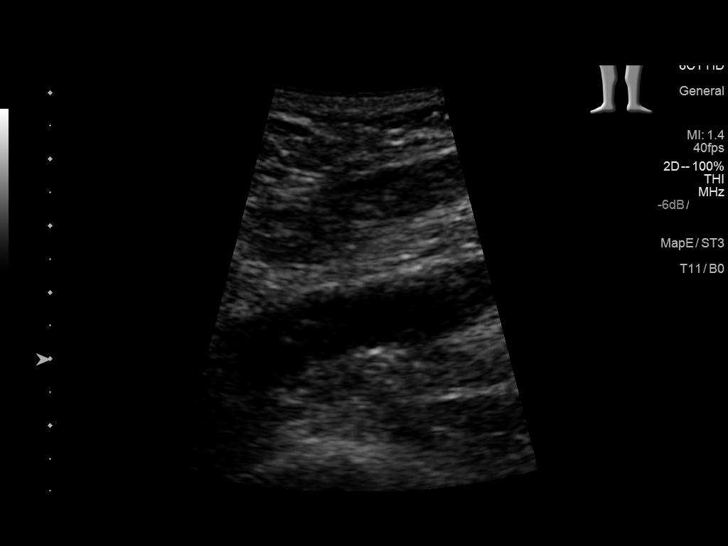
[im 9/34]
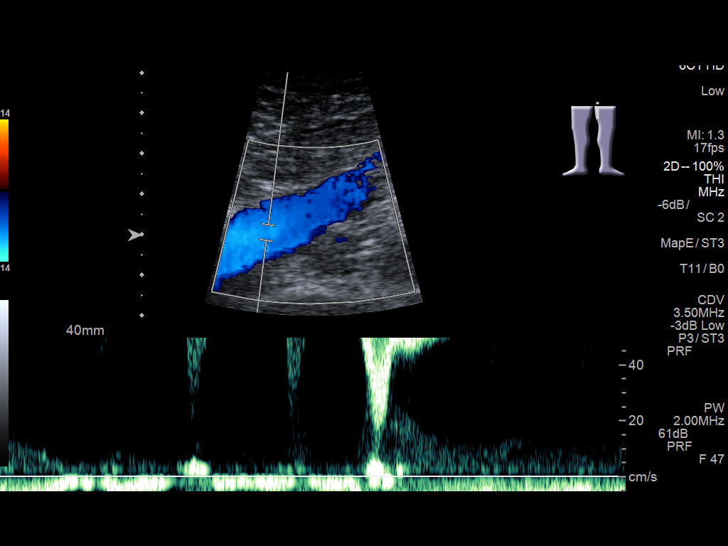
[im 12/34]
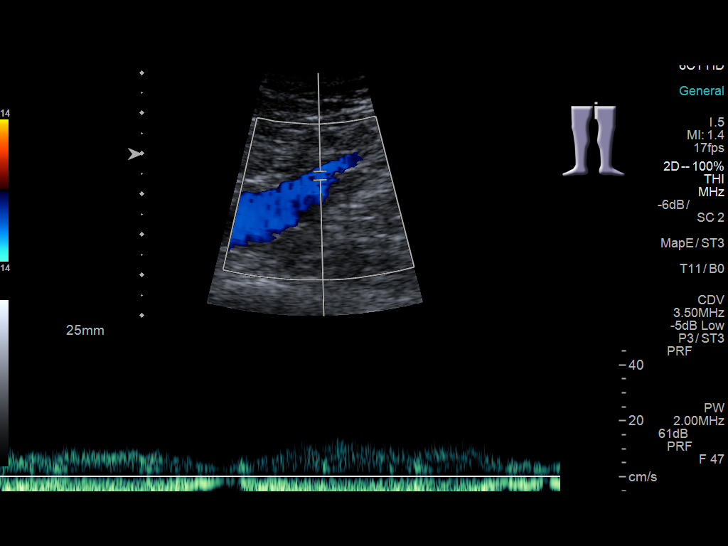
[im 15/34]
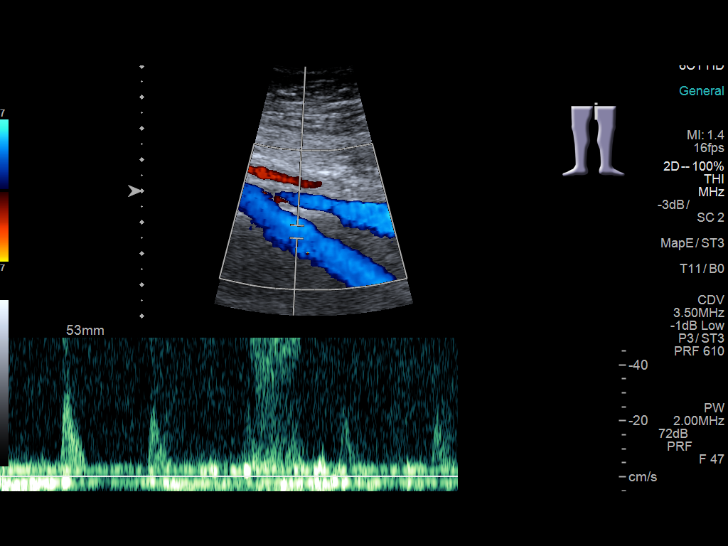
[im 18/34]
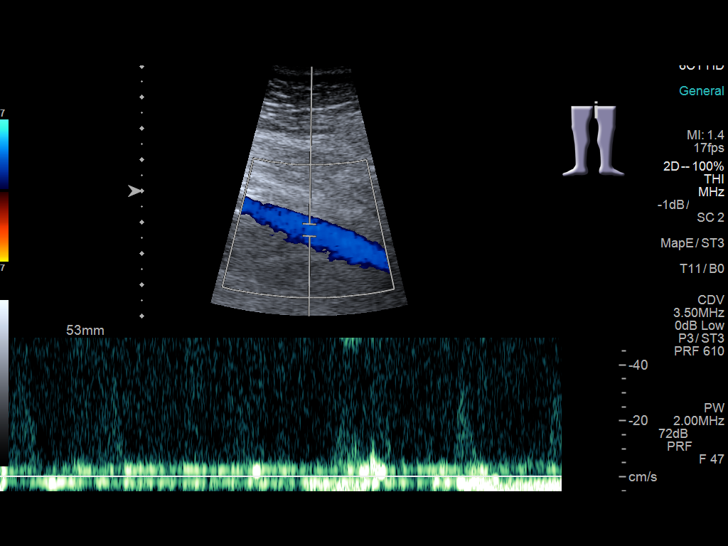
[im 19/34]
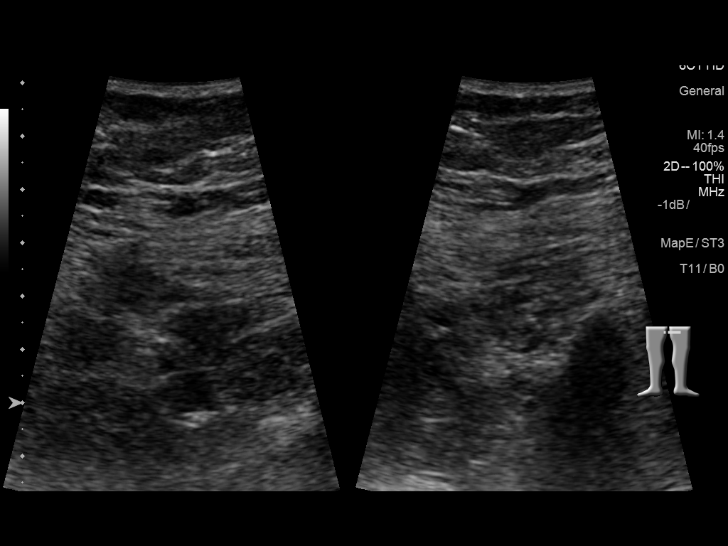
[im 22/34]
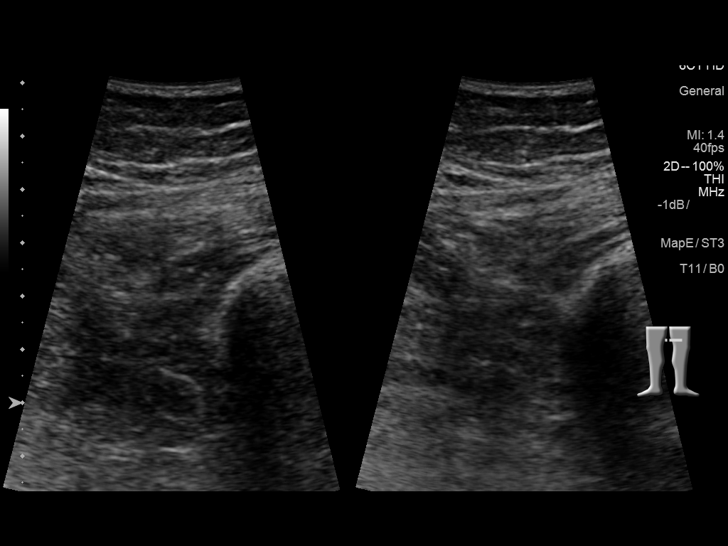
[im 25/34]
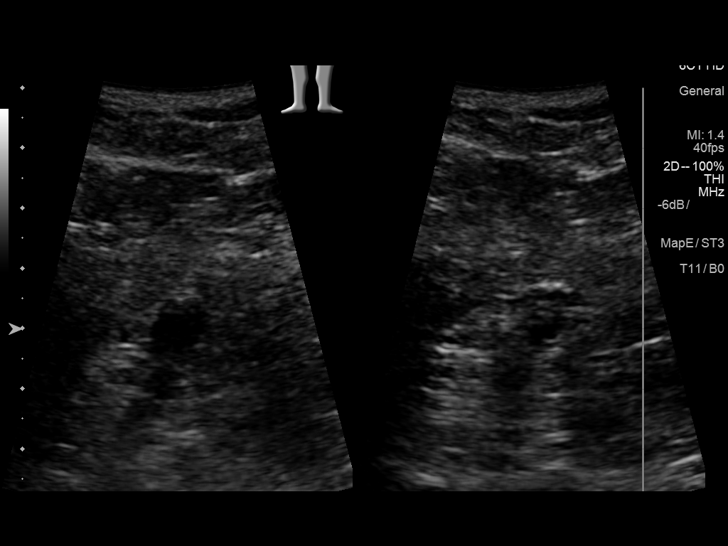
[im 28/34]
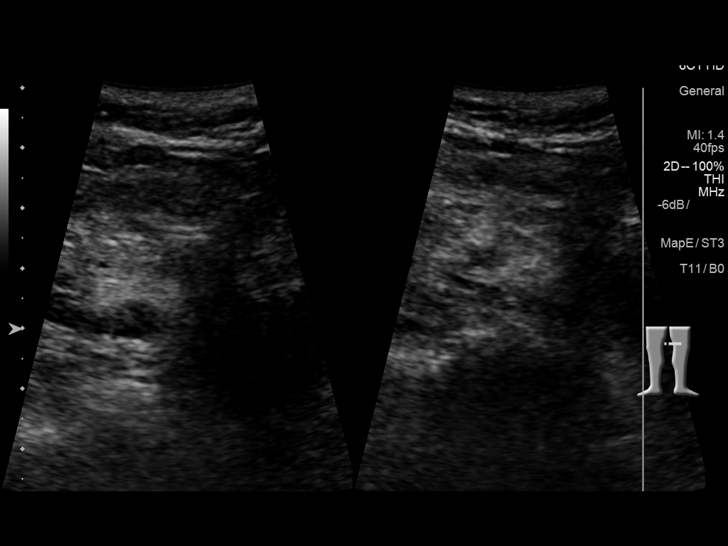
[im 31/34]
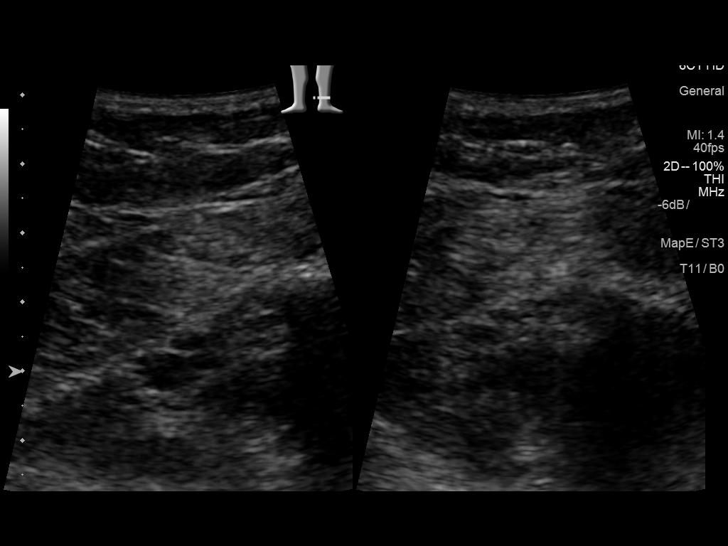
[im 34/34]
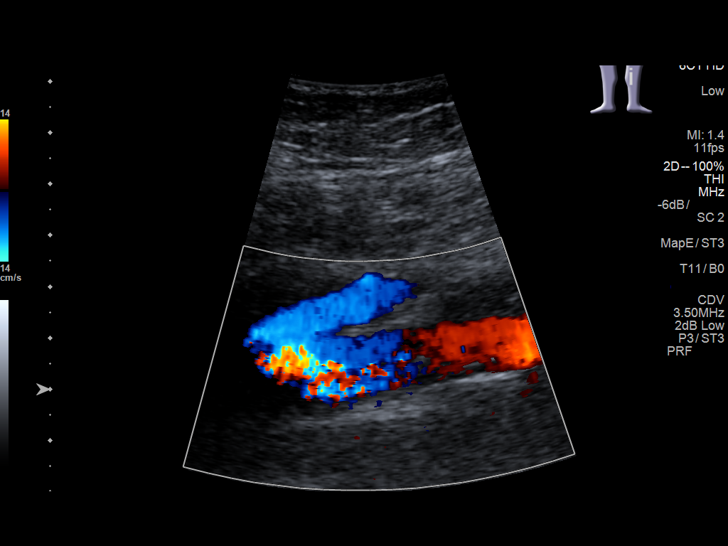

[13 of 24 positions shown; findings below may reference images not displayed]

FINDINGS: Contralateral Common Femoral Vein: Respiratory phasicity is normal
and symmetric with the symptomatic side. No evidence of thrombus.
Normal compressibility.

Common Femoral Vein: No evidence of thrombus. Normal
compressibility, respiratory phasicity and response to augmentation.

Saphenofemoral Junction: No evidence of thrombus. Normal
compressibility and flow on color Doppler imaging.

Profunda Femoral Vein: No evidence of thrombus. Normal
compressibility and flow on color Doppler imaging.

Femoral Vein: No evidence of thrombus. Normal compressibility,
respiratory phasicity and response to augmentation.

Popliteal Vein: No evidence of thrombus. Normal compressibility,
respiratory phasicity and response to augmentation.

Calf Veins: No evidence of thrombus. Normal compressibility and flow
on color Doppler imaging.

Superficial Great Saphenous Vein: No evidence of thrombus. Normal
compressibility and flow on color Doppler imaging.

Venous Reflux:  None.

Other Findings:  None.
IMPRESSION: No evidence of deep venous thrombosis.

## 2018-09-11 ENCOUNTER — Other Ambulatory Visit: Payer: Self-pay | Admitting: Cardiology

## 2018-09-11 DIAGNOSIS — I4901 Ventricular fibrillation: Secondary | ICD-10-CM

## 2018-09-11 DIAGNOSIS — Z01818 Encounter for other preprocedural examination: Secondary | ICD-10-CM

## 2018-09-11 DIAGNOSIS — I219 Acute myocardial infarction, unspecified: Secondary | ICD-10-CM

## 2018-09-18 ENCOUNTER — Ambulatory Visit: Admission: RE | Admit: 2018-09-18 | Payer: Medicare Other | Source: Ambulatory Visit | Admitting: Internal Medicine

## 2018-09-18 ENCOUNTER — Encounter: Admission: RE | Payer: Self-pay | Source: Ambulatory Visit

## 2018-09-18 SURGERY — COLONOSCOPY WITH PROPOFOL
Anesthesia: General

## 2018-09-19 ENCOUNTER — Other Ambulatory Visit: Payer: Medicare Other

## 2018-09-20 ENCOUNTER — Other Ambulatory Visit: Payer: Self-pay

## 2018-09-20 ENCOUNTER — Encounter
Admission: RE | Admit: 2018-09-20 | Discharge: 2018-09-20 | Disposition: A | Discharge status: Home / Self Care | Payer: Medicare Other | Source: Ambulatory Visit | Attending: Cardiology | Admitting: Cardiology

## 2018-09-20 DIAGNOSIS — I219 Acute myocardial infarction, unspecified: Secondary | ICD-10-CM | POA: Insufficient documentation

## 2018-09-20 DIAGNOSIS — Z01818 Encounter for other preprocedural examination: Secondary | ICD-10-CM | POA: Diagnosis present

## 2018-09-20 DIAGNOSIS — I4901 Ventricular fibrillation: Secondary | ICD-10-CM | POA: Insufficient documentation

## 2018-09-20 MED ORDER — TECHNETIUM TC 99M TETROFOSMIN IV KIT
30.0000 | PACK | Freq: Once | INTRAVENOUS | Status: AC | PRN
  Administered 2018-09-20: 31.1 via INTRAVENOUS

## 2018-09-20 MED ORDER — TECHNETIUM TC 99M TETROFOSMIN IV KIT
10.7300 | PACK | Freq: Once | INTRAVENOUS | Status: AC | PRN
  Administered 2018-09-20: 10.73 via INTRAVENOUS

## 2018-09-20 MED ORDER — REGADENOSON 0.4 MG/5ML IV SOLN
0.4000 mg | Freq: Once | INTRAVENOUS | Status: AC
  Administered 2018-09-20: 0.4 mg via INTRAVENOUS

## 2018-09-25 LAB — NM MYOCAR MULTI W/SPECT W/WALL MOTION / EF
Estimated workload: 1 METS
Exercise duration (min): 1 min
Exercise duration (sec): 0 s
LV dias vol: 82 mL (ref 46–106)
LV sys vol: 26 mL
MPHR: 167 {beats}/min
Peak HR: 95 {beats}/min
Percent HR: 56 %
Rest HR: 56 {beats}/min
SDS: 2
SRS: 6
SSS: 6
TID: 1.22

## 2018-09-27 ENCOUNTER — Other Ambulatory Visit: Payer: Self-pay | Admitting: Family

## 2018-09-27 DIAGNOSIS — Z1231 Encounter for screening mammogram for malignant neoplasm of breast: Secondary | ICD-10-CM

## 2018-12-06 ENCOUNTER — Other Ambulatory Visit: Payer: Self-pay | Admitting: Internal Medicine

## 2018-12-06 DIAGNOSIS — M25511 Pain in right shoulder: Secondary | ICD-10-CM

## 2018-12-18 ENCOUNTER — Ambulatory Visit
Admission: RE | Admit: 2018-12-18 | Discharge: 2018-12-18 | Disposition: A | Discharge status: Home / Self Care | Payer: Medicare Other | Source: Ambulatory Visit | Attending: Internal Medicine | Admitting: Internal Medicine

## 2018-12-18 ENCOUNTER — Other Ambulatory Visit: Payer: Self-pay

## 2018-12-18 ENCOUNTER — Other Ambulatory Visit: Payer: Self-pay | Admitting: Internal Medicine

## 2018-12-18 ENCOUNTER — Ambulatory Visit: Payer: Medicare Other

## 2018-12-18 DIAGNOSIS — M25511 Pain in right shoulder: Secondary | ICD-10-CM

## 2018-12-25 ENCOUNTER — Other Ambulatory Visit: Payer: Self-pay

## 2018-12-25 ENCOUNTER — Inpatient Hospital Stay: Payer: Medicare Other

## 2018-12-25 ENCOUNTER — Inpatient Hospital Stay
Admission: EM | Admit: 2018-12-25 | Discharge: 2018-12-29 | DRG: 872 | Disposition: A | Discharge status: Home / Self Care | Payer: Medicare Other | Attending: Specialist | Admitting: Specialist

## 2018-12-25 ENCOUNTER — Encounter: Payer: Self-pay | Admitting: Emergency Medicine

## 2018-12-25 ENCOUNTER — Emergency Department: Payer: Medicare Other

## 2018-12-25 DIAGNOSIS — A419 Sepsis, unspecified organism: Secondary | ICD-10-CM | POA: Diagnosis not present

## 2018-12-25 DIAGNOSIS — I2510 Atherosclerotic heart disease of native coronary artery without angina pectoris: Secondary | ICD-10-CM | POA: Diagnosis present

## 2018-12-25 DIAGNOSIS — M7989 Other specified soft tissue disorders: Secondary | ICD-10-CM | POA: Diagnosis not present

## 2018-12-25 DIAGNOSIS — E785 Hyperlipidemia, unspecified: Secondary | ICD-10-CM | POA: Diagnosis present

## 2018-12-25 DIAGNOSIS — I252 Old myocardial infarction: Secondary | ICD-10-CM | POA: Diagnosis not present

## 2018-12-25 DIAGNOSIS — Z1159 Encounter for screening for other viral diseases: Secondary | ICD-10-CM | POA: Diagnosis not present

## 2018-12-25 DIAGNOSIS — L03116 Cellulitis of left lower limb: Secondary | ICD-10-CM | POA: Diagnosis present

## 2018-12-25 DIAGNOSIS — N179 Acute kidney failure, unspecified: Secondary | ICD-10-CM | POA: Diagnosis present

## 2018-12-25 DIAGNOSIS — Z7982 Long term (current) use of aspirin: Secondary | ICD-10-CM

## 2018-12-25 DIAGNOSIS — Z87891 Personal history of nicotine dependence: Secondary | ICD-10-CM

## 2018-12-25 DIAGNOSIS — A4151 Sepsis due to Escherichia coli [E. coli]: Secondary | ICD-10-CM | POA: Diagnosis not present

## 2018-12-25 DIAGNOSIS — Z803 Family history of malignant neoplasm of breast: Secondary | ICD-10-CM

## 2018-12-25 DIAGNOSIS — B9620 Unspecified Escherichia coli [E. coli] as the cause of diseases classified elsewhere: Secondary | ICD-10-CM | POA: Diagnosis present

## 2018-12-25 DIAGNOSIS — Z888 Allergy status to other drugs, medicaments and biological substances status: Secondary | ICD-10-CM

## 2018-12-25 DIAGNOSIS — Z79899 Other long term (current) drug therapy: Secondary | ICD-10-CM

## 2018-12-25 DIAGNOSIS — N390 Urinary tract infection, site not specified: Secondary | ICD-10-CM | POA: Diagnosis present

## 2018-12-25 DIAGNOSIS — R17 Unspecified jaundice: Secondary | ICD-10-CM | POA: Diagnosis present

## 2018-12-25 DIAGNOSIS — I10 Essential (primary) hypertension: Secondary | ICD-10-CM | POA: Diagnosis present

## 2018-12-25 DIAGNOSIS — Z955 Presence of coronary angioplasty implant and graft: Secondary | ICD-10-CM | POA: Diagnosis not present

## 2018-12-25 DIAGNOSIS — R6520 Severe sepsis without septic shock: Secondary | ICD-10-CM | POA: Diagnosis present

## 2018-12-25 DIAGNOSIS — R531 Weakness: Secondary | ICD-10-CM | POA: Diagnosis not present

## 2018-12-25 DIAGNOSIS — E7800 Pure hypercholesterolemia, unspecified: Secondary | ICD-10-CM | POA: Diagnosis present

## 2018-12-25 DIAGNOSIS — Z7902 Long term (current) use of antithrombotics/antiplatelets: Secondary | ICD-10-CM | POA: Diagnosis not present

## 2018-12-25 DIAGNOSIS — E119 Type 2 diabetes mellitus without complications: Secondary | ICD-10-CM | POA: Diagnosis present

## 2018-12-25 DIAGNOSIS — I6529 Occlusion and stenosis of unspecified carotid artery: Secondary | ICD-10-CM | POA: Diagnosis present

## 2018-12-25 DIAGNOSIS — E876 Hypokalemia: Secondary | ICD-10-CM | POA: Diagnosis present

## 2018-12-25 DIAGNOSIS — I82409 Acute embolism and thrombosis of unspecified deep veins of unspecified lower extremity: Secondary | ICD-10-CM

## 2018-12-25 DIAGNOSIS — R7989 Other specified abnormal findings of blood chemistry: Secondary | ICD-10-CM | POA: Diagnosis not present

## 2018-12-25 HISTORY — DX: Sepsis, unspecified organism: A41.9

## 2018-12-25 LAB — CBC
HCT: 33.8 % — ABNORMAL LOW (ref 36.0–46.0)
Hemoglobin: 11 g/dL — ABNORMAL LOW (ref 12.0–15.0)
MCH: 27.3 pg (ref 26.0–34.0)
MCHC: 32.5 g/dL (ref 30.0–36.0)
MCV: 83.9 fL (ref 80.0–100.0)
Platelets: 262 10*3/uL (ref 150–400)
RBC: 4.03 MIL/uL (ref 3.87–5.11)
RDW: 15.7 % — ABNORMAL HIGH (ref 11.5–15.5)
WBC: 16.7 10*3/uL — ABNORMAL HIGH (ref 4.0–10.5)
nRBC: 0 % (ref 0.0–0.2)

## 2018-12-25 LAB — HEPATIC FUNCTION PANEL
ALT: 18 U/L (ref 0–44)
AST: 29 U/L (ref 15–41)
Albumin: 3.3 g/dL — ABNORMAL LOW (ref 3.5–5.0)
Alkaline Phosphatase: 67 U/L (ref 38–126)
Bilirubin, Direct: 0.3 mg/dL — ABNORMAL HIGH (ref 0.0–0.2)
Indirect Bilirubin: 1 mg/dL — ABNORMAL HIGH (ref 0.3–0.9)
Total Bilirubin: 1.3 mg/dL — ABNORMAL HIGH (ref 0.3–1.2)
Total Protein: 7.3 g/dL (ref 6.5–8.1)

## 2018-12-25 LAB — FIBRIN DERIVATIVES D-DIMER (ARMC ONLY): Fibrin derivatives D-dimer (ARMC): 1381.22 ng/mL (FEU) — ABNORMAL HIGH (ref 0.00–499.00)

## 2018-12-25 LAB — URINALYSIS, COMPLETE (UACMP) WITH MICROSCOPIC
Bilirubin Urine: NEGATIVE
Glucose, UA: NEGATIVE mg/dL
Ketones, ur: 5 mg/dL — AB
Nitrite: NEGATIVE
Protein, ur: 100 mg/dL — AB
Specific Gravity, Urine: 1.018 (ref 1.005–1.030)
WBC, UA: >50 WBC/hpf — ABNORMAL HIGH (ref 0–5)
pH: 5 (ref 5.0–8.0)

## 2018-12-25 LAB — BASIC METABOLIC PANEL
Anion gap: 11 (ref 5–15)
BUN: 28 mg/dL — ABNORMAL HIGH (ref 6–20)
CO2: 27 mmol/L (ref 22–32)
Calcium: 8.7 mg/dL — ABNORMAL LOW (ref 8.9–10.3)
Chloride: 100 mmol/L (ref 98–111)
Creatinine, Ser: 1.71 mg/dL — ABNORMAL HIGH (ref 0.44–1.00)
GFR calc Af Amer: 39 mL/min — ABNORMAL LOW (ref >60)
GFR calc non Af Amer: 33 mL/min — ABNORMAL LOW (ref >60)
Glucose, Bld: 126 mg/dL — ABNORMAL HIGH (ref 70–99)
Potassium: 2.9 mmol/L — ABNORMAL LOW (ref 3.5–5.1)
Sodium: 138 mmol/L (ref 135–145)

## 2018-12-25 LAB — SARS CORONAVIRUS 2 BY RT PCR (HOSPITAL ORDER, PERFORMED IN ~~LOC~~ HOSPITAL LAB): SARS Coronavirus 2: NEGATIVE

## 2018-12-25 LAB — PROCALCITONIN: Procalcitonin: 7.1 ng/mL

## 2018-12-25 LAB — LACTIC ACID, PLASMA
Lactic Acid, Venous: 1.2 mmol/L (ref 0.5–1.9)
Lactic Acid, Venous: 2.2 mmol/L (ref 0.5–1.9)
Lactic Acid, Venous: 2.3 mmol/L (ref 0.5–1.9)

## 2018-12-25 LAB — PREGNANCY, URINE: Preg Test, Ur: NEGATIVE

## 2018-12-25 MED ORDER — CHOLECALCIFEROL 10 MCG (400 UNIT) PO TABS: 50000.0000 [IU] | ORAL_TABLET | ORAL | Status: DC

## 2018-12-25 MED ORDER — SODIUM CHLORIDE 0.9 % IV BOLUS
1000.0000 mL | Freq: Once | INTRAVENOUS | Status: AC
  Administered 2018-12-25: 1000 mL via INTRAVENOUS

## 2018-12-25 MED ORDER — NITROGLYCERIN 0.4 MG SL SUBL: 0.4000 mg | SUBLINGUAL_TABLET | SUBLINGUAL | Status: DC | PRN

## 2018-12-25 MED ORDER — ACETAMINOPHEN 500 MG PO TABS
1000.0000 mg | ORAL_TABLET | Freq: Once | ORAL | Status: AC
  Administered 2018-12-25: 10:00:00 1000 mg via ORAL
  Filled 2018-12-25: qty 2

## 2018-12-25 MED ORDER — SODIUM CHLORIDE 0.9 % IV BOLUS
1000.0000 mL | Freq: Once | INTRAVENOUS | Status: AC
  Administered 2018-12-25: 12:00:00 1000 mL via INTRAVENOUS

## 2018-12-25 MED ORDER — SODIUM CHLORIDE 0.9% FLUSH
3.0000 mL | Freq: Once | INTRAVENOUS | Status: AC
  Administered 2018-12-25: 10:00:00 3 mL via INTRAVENOUS

## 2018-12-25 MED ORDER — FLUTICASONE PROPIONATE 50 MCG/ACT NA SUSP
1.0000 | Freq: Every day | NASAL | Status: DC
  Administered 2018-12-26 – 2018-12-29 (×4): 1 via NASAL
  Filled 2018-12-25: qty 16

## 2018-12-25 MED ORDER — LORATADINE 10 MG PO TABS
10.0000 mg | ORAL_TABLET | Freq: Every day | ORAL | Status: DC
  Administered 2018-12-29: 10 mg via ORAL
  Filled 2018-12-25 (×4): qty 1

## 2018-12-25 MED ORDER — SODIUM CHLORIDE 0.9 % IV SOLN
INTRAVENOUS | Status: DC
  Administered 2018-12-25 – 2018-12-28 (×6): via INTRAVENOUS

## 2018-12-25 MED ORDER — VITAMIN D (ERGOCALCIFEROL) 1.25 MG (50000 UNIT) PO CAPS
50000.0000 [IU] | ORAL_CAPSULE | ORAL | Status: DC
  Filled 2018-12-25: qty 1

## 2018-12-25 MED ORDER — ASPIRIN EC 81 MG PO TBEC
81.0000 mg | DELAYED_RELEASE_TABLET | Freq: Every day | ORAL | Status: DC
  Administered 2018-12-25 – 2018-12-29 (×5): 81 mg via ORAL
  Filled 2018-12-25 (×5): qty 1

## 2018-12-25 MED ORDER — PANTOPRAZOLE SODIUM 40 MG PO TBEC
40.0000 mg | DELAYED_RELEASE_TABLET | Freq: Every day | ORAL | Status: DC
  Administered 2018-12-25 – 2018-12-29 (×4): 40 mg via ORAL
  Filled 2018-12-25 (×5): qty 1

## 2018-12-25 MED ORDER — SODIUM CHLORIDE 0.9 % IV BOLUS
1000.0000 mL | Freq: Once | INTRAVENOUS | Status: AC
  Administered 2018-12-25: 13:00:00 1000 mL via INTRAVENOUS

## 2018-12-25 MED ORDER — POTASSIUM CHLORIDE 10 MEQ/100ML IV SOLN
10.0000 meq | INTRAVENOUS | Status: AC
  Administered 2018-12-25 (×4): 10 meq via INTRAVENOUS
  Filled 2018-12-25 (×4): qty 100

## 2018-12-25 MED ORDER — ATORVASTATIN CALCIUM 20 MG PO TABS
40.0000 mg | ORAL_TABLET | Freq: Every day | ORAL | Status: DC
  Administered 2018-12-27 – 2018-12-28 (×2): 40 mg via ORAL
  Filled 2018-12-25 (×3): qty 2

## 2018-12-25 MED ORDER — SODIUM CHLORIDE 0.9 % IV SOLN
1.0000 g | INTRAVENOUS | Status: DC
  Administered 2018-12-25 – 2018-12-29 (×5): 1 g via INTRAVENOUS
  Filled 2018-12-25: qty 1
  Filled 2018-12-25 (×2): qty 10
  Filled 2018-12-25 (×3): qty 1

## 2018-12-25 MED ORDER — MEDROXYPROGESTERONE ACETATE 10 MG PO TABS
10.0000 mg | ORAL_TABLET | Freq: Every day | ORAL | Status: DC
  Administered 2018-12-29: 10 mg via ORAL
  Filled 2018-12-25 (×5): qty 1

## 2018-12-25 MED ORDER — CLOPIDOGREL BISULFATE 75 MG PO TABS
75.0000 mg | ORAL_TABLET | Freq: Every day | ORAL | Status: DC
  Administered 2018-12-25 – 2018-12-29 (×5): 75 mg via ORAL
  Filled 2018-12-25 (×5): qty 1

## 2018-12-25 NOTE — Progress Notes (Signed)
CRITICAL VALUE ALERT  Critical Value:  Lactic 2.2  Date & Time Notied:  12/25/2018 1855  Provider Notified: On-call  Orders Received/Actions taken: Awaiting call back. Will notify oncoming RN assuming care.   Madlyn Frankel, RN

## 2018-12-25 NOTE — H&P (Addendum)
[facility] NAME: Sarah Medina    MR#:  [id number]  DATE OF BIRTH:  Nov 11, 1965  DATE OF ADMISSION:  12/25/2018  PRIMARY CARE PHYSICIAN: Perrin Maltese, MD   REQUESTING/REFERRING PHYSICIAN: Harvest Dark, MD  CHIEF COMPLAINT:   Chief Complaint  Patient presents with  . Weakness    HISTORY OF PRESENT ILLNESS:  53 y.o. female with pertinent past medical history of CAD, hypertension, hyperlipidemia, and MI presenting to the ED with chief complaints of generalized fatigue, fevers and chills since yesterday.  Patient reports onset of symptoms since 12/25/18 describes symptoms of feeling extremely tired, fever, chills, lower abdominal pain, headache, nausea and vomiting, dysuria, and burning on urination.  Denies associated symptoms of shortness of breath, chest pain, cough, or diarrhea.  He presented to the ED for further evaluation.  On arrival to the ED, she was afebrile temp of 100.6 with blood pressure 115/62 mm Hg and pulse rate 112 beats/min. There were no focal neurological deficits; she was lethargic but alert and oriented x 4.  She will labs revealed elevated procalcitonin 7.1, bilirubin 1.3, K+, BUN 28, creatinine 1.71, WBC 16.7, lactic acid 1.2, UA positive for UTI. ECG showed sinus rhythm of 110 beats per minute.  Code sepsis was initiated and started on IV antibiotics and IVF bolus.  Patient is being admitted to hospitalist service for further management.  PAST MEDICAL HISTORY:   Past Medical History:  Diagnosis Date  . Arthritis    hips, right leg  . CAD in native artery, RCA, LAD and LCX 07/23/2015  . Carotid artery occlusion   . H/O myocardial infarction less than 8 weeks- STEMI inf wall 07/20/15- stent to RCA 07/20/15   inf wall STEMI with DES to RCA  . Hypercholesteremia   . Hypertension   . Periorbital edema    treated with steroids  . Seizures ([employer])    as teenager, ages [age].  none since  . STEMI (ST  elevation myocardial infarction) ([place]) 07/23/15    PAST SURGICAL HISTORY:   Past Surgical History:  Procedure Laterality Date  . CARDIAC CATHETERIZATION N/A 07/20/2015   Procedure: Left Heart Cath and Coronary Angiography;  Surgeon: Isaias Cowman, MD;  Location: [facility];  Service: Cardiovascular;  Laterality: N/A;  . CARDIAC CATHETERIZATION N/A 07/20/2015   Procedure: Coronary Stent Intervention;  Surgeon: Isaias Cowman, MD;  Location: [place];  Service: Cardiovascular;  Laterality: N/A;  . CARDIAC CATHETERIZATION N/A 07/23/2015   Procedure: Left Heart Cath and Coronary Angiography;  Surgeon: Lorretta Harp, MD;  Location: [place] [facility];  Service: Cardiovascular;  Laterality: N/A;  . CARDIAC CATHETERIZATION N/A 07/23/2015   Procedure: Coronary Balloon Angioplasty;  Surgeon: Lorretta Harp, MD;  Location: [place];  Service: Cardiovascular;  Laterality: N/A;  . CARDIAC CATHETERIZATION N/A 07/12/2016   Procedure: Left Heart Cath;  Surgeon: Teodoro Spray, MD;  Location: [place] [facility];  Service: Cardiovascular;  Laterality: N/A;  . CARDIAC CATHETERIZATION N/A 07/12/2016   Procedure: Left Heart Cath and Coronary Angiography;  Surgeon: Isaias Cowman, MD;  Location: [place] [facility];  Service: Cardiovascular;  Laterality: N/A;  . CARDIAC CATHETERIZATION N/A 07/12/2016   Procedure: Intravascular Pressure Wire/FFR Study;  Surgeon: Isaias Cowman, MD;  Location: [facility];  Service: Cardiovascular;  Laterality: N/A;  . CARDIAC CATHETERIZATION N/A 07/12/2016   Procedure: Coronary Stent Intervention;  Surgeon: Isaias Cowman, MD;  Location:  [place];  Service: Cardiovascular;  Laterality: N/A;  . KNEE SURGERY Right 15 years ago   SOCIAL HISTORY:   Social History   Tobacco Use  . Smoking status: Never Smoker  . Smokeless tobacco: Former Systems developer    Types: Snuff  Substance Use Topics  . Alcohol use: No    FAMILY HISTORY:   Family History  Problem Relation Age of Onset  . Breast cancer Sister 12   DRUG ALLERGIES:   Allergies  Allergen Reactions  . Enalapril Swelling    Swelling face   REVIEW OF SYSTEMS:   Review of Systems  Constitutional: Positive for chills and fever. Negative for malaise/fatigue and weight loss.  HENT: Negative for congestion, hearing loss and sore throat.   Eyes: Negative for blurred vision and double vision.  Respiratory: Negative for cough, shortness of breath and wheezing.   Cardiovascular: Negative for chest pain, palpitations, orthopnea and leg swelling.  Gastrointestinal: Positive for abdominal pain, nausea and vomiting. Negative for diarrhea.  Genitourinary: Positive for dysuria and urgency.  Musculoskeletal: Positive for myalgias.  Skin: Negative for rash.  Neurological: Positive for weakness and headaches. Negative for dizziness, sensory change, speech change and focal weakness.  Psychiatric/Behavioral: Negative for depression.   MEDICATIONS AT HOME:   Prior to Admission medications   Medication Sig Start Date End Date Taking? Authorizing Provider  aspirin EC 81 MG tablet Take 81 mg by mouth daily.   Yes [provider]  atorvastatin (LIPITOR) 80 MG tablet Take 1 tablet (80 mg total) by mouth daily at 6 PM. Patient taking differently: Take 40 mg by mouth daily at 6 PM.  07/27/15  Yes Eileen Stanford, PA-C  cholecalciferol (VITAMIN D) 400 units TABS tablet Take 50,000 Units by mouth every Monday.    Yes [provider]  clopidogrel (PLAVIX) 75 MG tablet Take 1 tablet (75 mg total) by mouth daily. 01/26/16  Yes Tawni Millers, MD  hydrochlorothiazide (HYDRODIURIL) 12.5 MG tablet Take 12.5 mg by mouth daily.   Yes [provider]  losartan (COZAAR) 50 MG tablet Take 50 mg by mouth daily.   Yes [provider]  metoprolol tartrate (LOPRESSOR) 25 MG tablet Take 0.5 tablets (12.5 mg total) by mouth 2 (two) times  daily. 07/13/16  Yes Sudini, Alveta Heimlich, MD  nitroGLYCERIN (NITROSTAT) 0.4 MG SL tablet Place 1 tablet (0.4 mg total) under the tongue every 5 (five) minutes x 3 doses as needed for chest pain. 07/27/15  Yes Eileen Stanford, PA-C      VITAL SIGNS:  Blood pressure 112/60, pulse 82, temperature 99.8 F (37.7 C), temperature source Oral, resp. rate 20, height 5\' 6"  (1.676 m), weight 127 kg, last menstrual period 05/19/2015, SpO2 98 %.  PHYSICAL EXAMINATION:   Physical Exam  GENERAL:  53 y.o.-year-old patient lying in the bed with no acute distress.  EYES: Pupils equal, round, reactive to light and accommodation. No scleral icterus. Extraocular muscles intact.  HEENT: Head atraumatic, normocephalic. Oropharynx and nasopharynx clear.  NECK:  Supple, no jugular venous distention. No thyroid enlargement, no tenderness.  LUNGS: Normal breath sounds bilaterally, no wheezing, rales,rhonchi or crepitation. No use of accessory muscles of respiration.  CARDIOVASCULAR: S1, S2 normal. No murmurs, rubs, or gallops.  ABDOMEN: Soft, nontender, nondistended. Bowel sounds present. No organomegaly or mass.  EXTREMITIES: No pedal edema, cyanosis, or clubbing. No rash or lesions. + pedal pulses MUSCULOSKELETAL: Normal bulk, and power was 5+ grip and elbow, knee, and ankle flexion and extension  bilaterally.  NEUROLOGIC:Alert and oriented x 3. CN 2-12 intact. Sensation to light touch and cold stimuli intact bilaterally. Finger to nose nl. Babinski is downgoing. DTR's (biceps, patellar, and achilles) 2+ and symmetric throughout. Gait not tested due to safety concern. PSYCHIATRIC: The patient is alert and oriented x 3.  SKIN: No obvious rash, lesion, or ulcer.   DATA REVIEWED:  LABORATORY PANEL:   CBC Recent Labs  Lab 12/25/18 0916  WBC 16.7*  HGB 11.0*  HCT 33.8*  PLT 262   ------------------------------------------------------------------------------------------------------------------  Chemistries   Recent Labs  Lab 12/25/18 0816 12/25/18 0916  NA  --  138  K  --  2.9*  CL  --  100  CO2  --  27  GLUCOSE  --  126*  BUN  --  28*  CREATININE  --  1.71*  CALCIUM  --  8.7*  AST 29  --   ALT 18  --   ALKPHOS 67  --   BILITOT 1.3*  --    ------------------------------------------------------------------------------------------------------------------  Cardiac Enzymes No results for input(s): TROPONINI in the last 168 hours. ------------------------------------------------------------------------------------------------------------------  RADIOLOGY:  Dg Chest Portable 1 View  Result Date: 12/25/2018 CLINICAL DATA:  Weakness.  Headache.  Fever. EXAM: PORTABLE CHEST 1 VIEW COMPARISON:  Chest x-ray 07/11/2016. FINDINGS: Mediastinum and hilar structures normal. Lungs are clear. No pleural effusion or pneumothorax. Heart size normal. No acute bony abnormality. IMPRESSION: No acute cardiopulmonary disease. Electronically Signed   By: Marcello Moores  Register   On: 12/25/2018 10:17    EKG:  EKG: unchanged from previous tracings, sinus tachycardia. Vent. rate 110 BPM PR interval * ms QRS duration 91 ms QT/QTc 356/482 ms P-R-T axes 77 38 84 IMPRESSION AND PLAN:   53 y.o. female with pertinent past medical history of CAD, hypertension, hyperlipidemia, and MI presenting to the ED with chief complaints of generalized fatigue, fevers and chills since yesterday.  1. Sepsis - Patient presenting with fever 100.6, tachycardia, hypotension, elevated lactic acid, leukocytosis and elevated procalcitonin. - Likely secondary to UTI - Admit to MedSurg floor with telemetry monitoring - Blood cultures pending - IVFs and bolus to keep MAP> 60  2. UTI - UA shows UTI - Urine culture pending - Start IV ceftriaxone  3. Hypokalemia -Replace with IV potassium -Check in a.m.  4.  Elevated bilirubin  5.  AKI - Prerenal - Avoid nephrotoxins - IVFs - Continue to monitor renal function  6. Coronary  Artery Disease -in negative artery, right CEA, LAD, and LCx, history of MI with DES to RCA - ASA 81mg  PO daily - Clopidogrel 75mg  PO daily  - HTN, HLD, DM control as below  7. HLD  + Goal LDL<100 - Atorvastatin 40mg  PO qhs  8. HTN = no hypertensive + Goal BP <140/90 -Hold BP meds in the setting of sepsis -We will resume once BP stable  9. DVT prophylaxis - Heparin SubQ   All the records are reviewed and case discussed with ED provider. Management plans discussed with the patient, family and they are in agreement.  CODE STATUS: FULL  TOTAL TIME TAKING CARE OF THIS PATIENT: 45 minutes.    on 12/25/2018 at 2:35 PM  This patient was staffed with Dr. Fritzi Mandes who personally evaluated patient, reviewed documentation and agreed with assessment and plan of care as above.  Rufina Falco, DNP, FNP-BC [facility] Nurse Practitioner Between 7am to 6pm - Pager [phone]  After 6pm go to [email or web address] - [facility]  Office  [phone]  CC: Primary care physician; Perrin Maltese, MD

## 2018-12-25 NOTE — ED Notes (Signed)
purewick placed by this RN at this time due to patient c/o feeling like she needs to urinate.

## 2018-12-25 NOTE — ED Triage Notes (Signed)
Pt here from home via [facility] with c/o weakness/headache/fever since yesterday.  Pt A&O x4 upon arrival. Denies any other symptoms.

## 2018-12-25 NOTE — Progress Notes (Signed)
Notified provider of need to administer fluid bolus. Orders received and fluids administered to complete sepsis bundle.

## 2018-12-25 NOTE — ED Provider Notes (Signed)
[facility] Emergency Department Provider Note  Time seen: 9:31 AM  I have reviewed the triage vital signs and the nursing notes.   HISTORY  Chief Complaint Weakness   HPI Sarah Medina is a 53 y.o. female with a past medical history of CAD, hypertension, hyperlipidemia, presents to the emergency department for generalized fatigue, fever.  According to the patient since yesterday she has had a fever, fatigue, has been experiencing pain behind both of her eyes per patient (denies headache).  Denies any cough besides her "normal cough."  Does state sinus congestion and drainage over the past few days.  States mild dysuria as well.  Had one episode of vomiting this morning, no diarrhea.  No abdominal or chest pain.   Past Medical History:  Diagnosis Date  . Arthritis    hips, right leg  . CAD in native artery, RCA, LAD and LCX 07/23/2015  . Carotid artery occlusion   . H/O myocardial infarction less than 8 weeks- STEMI inf wall 07/20/15- stent to RCA 07/20/15   inf wall STEMI with DES to RCA  . Hypercholesteremia   . Hypertension   . Periorbital edema    treated with steroids  . Seizures ([place])    as teenager, ages [age].  none since  . STEMI (ST elevation myocardial infarction) ([place]) 07/23/15    Patient Active Problem List   Diagnosis Date Noted  . Chest pain, rule out acute myocardial infarction 07/11/2016  . Ischemic heart disease 10/20/2015  . Essential hypertension 10/20/2015  . Hyperlipidemia 10/20/2015  . Acute posthemorrhagic anemia   . H/O myocardial infarction less than 8 weeks- STEMI inf wall 07/20/15- stent to RCA 07/23/2015  . CAD in native artery, RCA, LAD and LCX 07/23/2015  . Ventricular fibrillation ([place]) 07/23/2015  . ST elevation myocardial infarction (STEMI) of inferior wall ([place]) 07/20/2015  . Abnormal LFTs 12/23/2014  . Osteoarthritis of right knee 11/24/2014    Past Surgical History:  Procedure Laterality Date  . CARDIAC  CATHETERIZATION N/A 07/20/2015   Procedure: Left Heart Cath and Coronary Angiography;  Surgeon: Isaias Cowman, MD;  Location: [place] [facility];  Service: Cardiovascular;  Laterality: N/A;  . CARDIAC CATHETERIZATION N/A 07/20/2015   Procedure: Coronary Stent Intervention;  Surgeon: Isaias Cowman, MD;  Location: [place] [facility];  Service: Cardiovascular;  Laterality: N/A;  . CARDIAC CATHETERIZATION N/A 07/23/2015   Procedure: Left Heart Cath and Coronary Angiography;  Surgeon: Lorretta Harp, MD;  Location: [place] [facility];  Service: Cardiovascular;  Laterality: N/A;  . CARDIAC CATHETERIZATION N/A 07/23/2015   Procedure: Coronary Balloon Angioplasty;  Surgeon: Lorretta Harp, MD;  Location: [place] [facility];  Service: Cardiovascular;  Laterality: N/A;  . CARDIAC CATHETERIZATION N/A 07/12/2016   Procedure: Left Heart Cath;  Surgeon: Teodoro Spray, MD;  Location: [place] [facility];  Service: Cardiovascular;  Laterality: N/A;  . CARDIAC CATHETERIZATION N/A 07/12/2016   Procedure: Left Heart Cath and Coronary Angiography;  Surgeon: Isaias Cowman, MD;  Location: [place] [facility];  Service: Cardiovascular;  Laterality: N/A;  . CARDIAC CATHETERIZATION N/A 07/12/2016   Procedure: Intravascular Pressure Wire/FFR Study;  Surgeon: Isaias Cowman, MD;  Location: [place] [facility];  Service: Cardiovascular;  Laterality: N/A;  . CARDIAC CATHETERIZATION N/A 07/12/2016   Procedure: Coronary Stent Intervention;  Surgeon: Isaias Cowman, MD;  Location: [place] [facility];  Service: Cardiovascular;  Laterality: N/A;  . KNEE SURGERY Right 15 years ago    Prior to  Admission medications   Medication Sig Start Date End Date Taking? Authorizing Provider  aspirin EC 81 MG tablet Take 81 mg by mouth daily.    [provider]  atorvastatin (LIPITOR) 80 MG tablet Take 1 tablet (80 mg total) by mouth daily at 6 PM. Patient taking differently: Take 40 mg by mouth  daily at 6 PM.  07/27/15   Eileen Stanford, PA-C  cholecalciferol (VITAMIN D) 400 units TABS tablet Take 50,000 Units by mouth 7 days.    [provider]  clopidogrel (PLAVIX) 75 MG tablet Take 1 tablet (75 mg total) by mouth daily. 01/26/16   Tawni Millers, MD  fluticasone (FLONASE) 50 MCG/ACT nasal spray Place 1 spray into both nostrils daily.    [provider]  hydrochlorothiazide (HYDRODIURIL) 12.5 MG tablet Take 12.5 mg by mouth daily.    [provider]  loratadine (CLARITIN) 10 MG tablet Take 10 mg by mouth daily.    [provider]  losartan (COZAAR) 50 MG tablet Take 50 mg by mouth daily.    [provider]  medroxyPROGESTERone (PROVERA) 10 MG tablet Take 10 mg by mouth daily.    [provider]  metoprolol tartrate (LOPRESSOR) 25 MG tablet Take 0.5 tablets (12.5 mg total) by mouth 2 (two) times daily. 07/13/16   Hillary Bow, MD  nitroGLYCERIN (NITROSTAT) 0.4 MG SL tablet Place 1 tablet (0.4 mg total) under the tongue every 5 (five) minutes x 3 doses as needed for chest pain. 07/27/15   Eileen Stanford, PA-C  ranitidine (ZANTAC) 150 MG tablet Take 150 mg by mouth 2 (two) times daily.    [provider]  sulfamethoxazole-trimethoprim (BACTRIM DS,SEPTRA DS) 800-160 MG tablet Take 1 tablet by mouth 2 (two) times daily. Patient not taking: Reported on 04/10/2018 07/20/17   Victorino Dike, FNP    Allergies  Allergen Reactions  . Enalapril Swelling    Swelling face    Family History  Problem Relation Age of Onset  . Breast cancer Sister 48    Social History Social History   Tobacco Use  . Smoking status: Never Smoker  . Smokeless tobacco: Former Systems developer    Types: Snuff  Substance Use Topics  . Alcohol use: No  . Drug use: Never    Review of Systems Constitutional: Positive for fever since yesterday Eyes: Negative for visual complaint.  Pain behind both of her eyes. ENT: Positive for sinus  drainage Cardiovascular: Negative for chest pain. Respiratory: Negative for shortness of breath.   Gastrointestinal: Negative for abdominal pain.  One episode of vomiting this morning.  No diarrhea. Genitourinary: Mild dysuria Musculoskeletal: Negative for musculoskeletal complaints Skin: Negative for skin complaints  Neurological: Negative for headache All other ROS negative  ____________________________________________   PHYSICAL EXAM:  VITAL SIGNS: ED Triage Vitals  Enc Vitals Group     BP 12/25/18 0910 115/62     Pulse Rate 12/25/18 0910 (!) 112     Resp 12/25/18 0910 (!) 26     Temp 12/25/18 0910 (!) 100.6 F (38.1 C)     Temp Source 12/25/18 0910 Oral     SpO2 12/25/18 0910 100 %     Weight 12/25/18 0912 280 lb (127 kg)     Height 12/25/18 0912 5\' 6"  (1.676 m)     Head Circumference --      Peak Flow --      Pain Score 12/25/18 0912 0     Pain Loc --  Pain Edu? --      Excl. in [place]? --    Constitutional: Alert and oriented. Well appearing and in no distress. Eyes: Normal exam ENT      Head: Normocephalic and atraumatic.      Mouth/Throat: Mucous membranes are moist. Cardiovascular: Normal rate, regular rhythm.  Respiratory: Normal respiratory effort without tachypnea nor retractions. Breath sounds are clear  Gastrointestinal: Soft and nontender. No distention.  Musculoskeletal: Nontender with normal range of motion in all extremities. Neurologic:  Normal speech and language. No gross focal neurologic deficits Skin:  Skin is warm, dry and intact.  Psychiatric: Mood and affect are normal.  ____________________________________________    EKG  EKG viewed and interpreted by myself shows sinus tachycardia at 110 bpm with a narrow QRS, normal axis, normal intervals, nonspecific ST changes.  ____________________________________________    RADIOLOGY  Chest x-ray is negative  ____________________________________________   INITIAL IMPRESSION / ASSESSMENT  AND PLAN / ED COURSE  Pertinent labs & imaging results that were available during my care of the patient were reviewed by me and considered in my medical decision making (see chart for details).   Patient presents to the emergency department for generalized fatigue, weakness, fever, one episode of vomiting this morning, sinus congestion over the past few days.  Differential would include sepsis, urinary tract infection, pyelonephritis, coronavirus, pneumonia.  We will check labs, urinalysis, chest x-ray as well as coronavirus swab.  Patient's labs have resulted showing a leukocytosis, urinalysis consistent with urinary tract infection.  COVID test is negative.  Chest x-ray is negative.  We will start on antibiotics to cover for community-acquired urinary tract infection.  Patient will require admission to the hospital service for ongoing treatment of her sepsis due to urinary tract infection.  Sarah Medina was evaluated in Emergency Department on 12/25/2018 for the symptoms described in the history of present illness. She was evaluated in the context of the global COVID-19 pandemic, which necessitated consideration that the patient might be at risk for infection with the SARS-CoV-2 virus that causes COVID-19. Institutional protocols and algorithms that pertain to the evaluation of patients at risk for COVID-19 are in a state of rapid change based on information released by regulatory bodies including the CDC and federal and state organizations. These policies and algorithms were followed during the patient's care in the ED.  CRITICAL CARE Performed by: Harvest Dark   Total critical care time: 30 minutes  Critical care time was exclusive of separately billable procedures and treating other patients.  Critical care was necessary to treat or prevent imminent or life-threatening deterioration.  Critical care was time spent personally by me on the following activities: development of  treatment plan with patient and/or surrogate as well as nursing, discussions with consultants, evaluation of patient's response to treatment, examination of patient, obtaining history from patient or surrogate, ordering and performing treatments and interventions, ordering and review of laboratory studies, ordering and review of radiographic studies, pulse oximetry and re-evaluation of patient's condition.   ____________________________________________   FINAL CLINICAL IMPRESSION(S) / ED DIAGNOSES  Fever Sepsis UTI   Harvest Dark, MD 12/25/18 1106

## 2018-12-25 NOTE — ED Notes (Signed)
Blue top collected at this time.

## 2018-12-25 NOTE — ED Notes (Signed)
Attempted to call husband at phone number provided by secretary, phone is going straight to voicemail.

## 2018-12-26 ENCOUNTER — Inpatient Hospital Stay: Payer: Medicare Other

## 2018-12-26 DIAGNOSIS — N390 Urinary tract infection, site not specified: Secondary | ICD-10-CM

## 2018-12-26 DIAGNOSIS — R7989 Other specified abnormal findings of blood chemistry: Secondary | ICD-10-CM

## 2018-12-26 DIAGNOSIS — M7989 Other specified soft tissue disorders: Secondary | ICD-10-CM

## 2018-12-26 LAB — CBC WITH DIFFERENTIAL/PLATELET
Abs Immature Granulocytes: 0.05 10*3/uL (ref 0.00–0.07)
Basophils Absolute: 0 10*3/uL (ref 0.0–0.1)
Basophils Relative: 0 %
Eosinophils Absolute: 0 10*3/uL (ref 0.0–0.5)
Eosinophils Relative: 0 %
HCT: 32.3 % — ABNORMAL LOW (ref 36.0–46.0)
Hemoglobin: 10.1 g/dL — ABNORMAL LOW (ref 12.0–15.0)
Immature Granulocytes: 0 %
Lymphocytes Relative: 10 %
Lymphs Abs: 1.5 10*3/uL (ref 0.7–4.0)
MCH: 26.9 pg (ref 26.0–34.0)
MCHC: 31.3 g/dL (ref 30.0–36.0)
MCV: 85.9 fL (ref 80.0–100.0)
Monocytes Absolute: 0.6 10*3/uL (ref 0.1–1.0)
Monocytes Relative: 4 %
Neutro Abs: 12.3 10*3/uL — ABNORMAL HIGH (ref 1.7–7.7)
Neutrophils Relative %: 86 %
Platelets: 232 10*3/uL (ref 150–400)
RBC: 3.76 MIL/uL — ABNORMAL LOW (ref 3.87–5.11)
RDW: 16 % — ABNORMAL HIGH (ref 11.5–15.5)
WBC: 14.5 10*3/uL — ABNORMAL HIGH (ref 4.0–10.5)
nRBC: 0 % (ref 0.0–0.2)

## 2018-12-26 LAB — BASIC METABOLIC PANEL
Anion gap: 10 (ref 5–15)
BUN: 21 mg/dL — ABNORMAL HIGH (ref 6–20)
CO2: 23 mmol/L (ref 22–32)
Calcium: 7.8 mg/dL — ABNORMAL LOW (ref 8.9–10.3)
Chloride: 106 mmol/L (ref 98–111)
Creatinine, Ser: 1.08 mg/dL — ABNORMAL HIGH (ref 0.44–1.00)
GFR calc Af Amer: >60 mL/min (ref >60)
GFR calc non Af Amer: 58 mL/min — ABNORMAL LOW (ref >60)
Glucose, Bld: 91 mg/dL (ref 70–99)
Potassium: 3.4 mmol/L — ABNORMAL LOW (ref 3.5–5.1)
Sodium: 139 mmol/L (ref 135–145)

## 2018-12-26 LAB — PROTIME-INR
INR: 1.1 (ref 0.8–1.2)
Prothrombin Time: 14.5 seconds (ref 11.4–15.2)

## 2018-12-26 LAB — LACTIC ACID, PLASMA: Lactic Acid, Venous: 1.3 mmol/L (ref 0.5–1.9)

## 2018-12-26 LAB — LACTATE DEHYDROGENASE: LDH: 154 U/L (ref 98–192)

## 2018-12-26 LAB — FIBRINOGEN: Fibrinogen: 742 mg/dL — ABNORMAL HIGH (ref 210–475)

## 2018-12-26 LAB — CORTISOL-AM, BLOOD: Cortisol - AM: 18.6 ug/dL (ref 6.7–22.6)

## 2018-12-26 LAB — PROCALCITONIN: Procalcitonin: 3.82 ng/mL

## 2018-12-26 MED ORDER — ACETAMINOPHEN 325 MG PO TABS
650.0000 mg | ORAL_TABLET | Freq: Four times a day (QID) | ORAL | Status: DC | PRN
  Administered 2018-12-26 – 2018-12-28 (×2): 650 mg via ORAL
  Filled 2018-12-26 (×2): qty 2

## 2018-12-26 MED ORDER — HEPARIN SODIUM (PORCINE) 5000 UNIT/ML IJ SOLN
5000.0000 [IU] | Freq: Three times a day (TID) | INTRAMUSCULAR | Status: DC
  Administered 2018-12-26 – 2018-12-28 (×7): 5000 [IU] via SUBCUTANEOUS
  Filled 2018-12-26 (×7): qty 1

## 2018-12-26 NOTE — Progress Notes (Signed)
[facility] NAME: Sarah Medina    MR#:  [id number]  DATE OF BIRTH:  08/31/1965  SUBJECTIVE:   Chief Complaint  Patient presents with   Weakness   Patient is seen at the bedside. She is sitting up in bed eating breakfast. Overall she feels that her condition is improving. She is still running fevers but better with Tylenol.  REVIEW OF SYSTEMS:  Review of Systems  Constitutional: Positive for fever. Negative for chills, malaise/fatigue and weight loss.  HENT: Negative for congestion, hearing loss and sore throat.   Eyes: Negative for blurred vision and double vision.  Respiratory: Negative for cough, shortness of breath and wheezing.   Cardiovascular: Negative for chest pain, palpitations, orthopnea and leg swelling.  Gastrointestinal: Negative for abdominal pain, diarrhea, nausea and vomiting.  Genitourinary: Negative for dysuria and urgency.  Musculoskeletal: Negative for myalgias.  Skin: Negative for rash.  Neurological: Negative for dizziness, sensory change, speech change, focal weakness and headaches.  Psychiatric/Behavioral: Negative for depression.    DRUG ALLERGIES:   Allergies  Allergen Reactions   Enalapril Swelling    Swelling face   VITALS:  Blood pressure (!) 117/57, pulse 90, temperature 99.5 F (37.5 C), temperature source Oral, resp. rate 17, height 5\' 6"  (1.676 m), weight 127 kg, last menstrual period 05/19/2015, SpO2 97 %. PHYSICAL EXAMINATION:   GENERAL:  53 y.o.-year-old patient lying in the bed with no acute distress.  EYES: Pupils equal, round, reactive to light and accommodation. No scleral icterus. Extraocular muscles intact.  HEENT: Head atraumatic, normocephalic. Oropharynx and nasopharynx clear.  NECK:  Supple, no jugular venous distention. No thyroid enlargement, no tenderness.  LUNGS: Normal breath sounds bilaterally, no wheezing, rales,rhonchi or crepitation. No use of accessory  muscles of respiration.  CARDIOVASCULAR: S1, S2 normal. No murmurs, rubs, or gallops.  ABDOMEN: Soft, nontender, nondistended. Bowel sounds present. No organomegaly or mass.  EXTREMITIES: No pedal edema, cyanosis, or clubbing. No rash or lesions. + pedal pulses MUSCULOSKELETAL: Normal bulk, and power was 5+ grip and elbow, knee, and ankle flexion and extension bilaterally.  NEUROLOGIC:Alert and oriented x 3. CN 2-12 intact. Sensation to light touch and cold stimuli intact bilaterally. Finger to nose nl. Babinski is downgoing. DTR's (biceps, patellar, and achilles) 2+ and symmetric throughout. Gait not tested due to safety concern. PSYCHIATRIC: The patient is alert and oriented x 3.  SKIN: No obvious rash, lesion, or ulcer.   DATA REVIEWED:  LABORATORY PANEL:  Female CBC Recent Labs  Lab 12/26/18 0526  WBC 14.5*  HGB 10.1*  HCT 32.3*  PLT 232   ------------------------------------------------------------------------------------------------------------------ Chemistries  Recent Labs  Lab 12/25/18 0816  12/26/18 0526  NA  --    < > 139  K  --    < > 3.4*  CL  --    < > 106  CO2  --    < > 23  GLUCOSE  --    < > 91  BUN  --    < > 21*  CREATININE  --    < > 1.08*  CALCIUM  --    < > 7.8*  AST 29  --   --   ALT 18  --   --   ALKPHOS 67  --   --   BILITOT 1.3*  --   --    < > = values in this interval not displayed.   RADIOLOGY:  Ct Head Wo Contrast  Result Date: 12/25/2018 CLINICAL DATA:  Weakness and fever with headache EXAM: CT HEAD WITHOUT CONTRAST TECHNIQUE: Contiguous axial images were obtained from the base of the skull through the vertex without intravenous contrast. COMPARISON:  Head CT 09/08/2015 FINDINGS: Brain: There is no mass, hemorrhage or extra-axial collection. The size and configuration of the ventricles and extra-axial CSF spaces are normal. The brain parenchyma is normal, without acute or chronic infarction. Vascular: No abnormal hyperdensity of the major  intracranial arteries or dural venous sinuses. No intracranial atherosclerosis. Skull: The visualized skull base, calvarium and extracranial soft tissues are normal. Sinuses/Orbits: No fluid levels or advanced mucosal thickening of the visualized paranasal sinuses. No mastoid or middle ear effusion. The orbits are normal. IMPRESSION: Normal head CT. Electronically Signed   By: Ulyses Jarred M.D.   On: 12/25/2018 21:24   US Venous Img Lower Unilateral Left  Result Date: 12/26/2018 CLINICAL DATA:  53 year old female with left leg swelling. EXAM: Left LOWER EXTREMITY VENOUS DOPPLER ULTRASOUND TECHNIQUE: Gray-scale sonography with graded compression, as well as color Doppler and duplex ultrasound were performed to evaluate the lower extremity deep venous systems from the level of the common femoral vein and including the common femoral, femoral, profunda femoral, popliteal and calf veins including the posterior tibial, peroneal and gastrocnemius veins when visible. The superficial great saphenous vein was also interrogated. Spectral Doppler was utilized to evaluate flow at rest and with distal augmentation maneuvers in the common femoral, femoral and popliteal veins. COMPARISON:  Ultrasound dated 05/23/2016 FINDINGS: Contralateral Common Femoral Vein: Respiratory phasicity is normal and symmetric with the symptomatic side. No evidence of thrombus. Normal compressibility. Common Femoral Vein: No evidence of thrombus. Normal compressibility, respiratory phasicity and response to augmentation. Saphenofemoral Junction: No evidence of thrombus. Normal compressibility and flow on color Doppler imaging. Profunda Femoral Vein: No evidence of thrombus. Normal compressibility and flow on color Doppler imaging. Femoral Vein: No evidence of thrombus. Normal compressibility, respiratory phasicity and response to augmentation. Popliteal Vein: No evidence of thrombus. Normal compressibility, respiratory phasicity and response to  augmentation. Calf Veins: No evidence of thrombus. Normal compressibility and flow on color Doppler imaging. Superficial Great Saphenous Vein: No evidence of thrombus. Normal compressibility. Venous Reflux:  None. Other Findings:  None. IMPRESSION: No evidence of deep venous thrombosis. Electronically Signed   By: Anner Crete M.D.   On: 12/26/2018 00:46   US Venous Img Lower Unilateral Right  Result Date: 12/26/2018 CLINICAL DATA:  Right lower extremity pain and edema. Elevated D-dimer. EXAM: RIGHT LOWER EXTREMITY VENOUS DOPPLER ULTRASOUND TECHNIQUE: Gray-scale sonography with graded compression, as well as color Doppler and duplex ultrasound were performed to evaluate the lower extremity deep venous systems from the level of the common femoral vein and including the common femoral, femoral, profunda femoral, popliteal and calf veins including the posterior tibial, peroneal and gastrocnemius veins when visible. The superficial great saphenous vein was also interrogated. Spectral Doppler was utilized to evaluate flow at rest and with distal augmentation maneuvers in the common femoral, femoral and popliteal veins. COMPARISON:  Left lower extremity study on 12/25/2018 FINDINGS: Contralateral Common Femoral Vein: Respiratory phasicity is normal and symmetric with the symptomatic side. No evidence of thrombus. Normal compressibility. Common Femoral Vein: No evidence of thrombus. Normal compressibility, respiratory phasicity and response to augmentation. Saphenofemoral Junction: No evidence of thrombus. Normal compressibility and flow on color Doppler imaging. Profunda Femoral Vein: No evidence of thrombus. Normal compressibility and flow on color Doppler imaging. Femoral Vein: No evidence of thrombus. Normal  compressibility, respiratory phasicity and response to augmentation. Popliteal Vein: No evidence of thrombus. Normal compressibility, respiratory phasicity and response to augmentation. Calf Veins: No  evidence of thrombus. Normal compressibility and flow on color Doppler imaging. Superficial Great Saphenous Vein: No evidence of thrombus. Normal compressibility. Venous Reflux:  None. Other Findings: No evidence of superficial thrombophlebitis or abnormal fluid collection. IMPRESSION: No evidence of right lower extremity deep venous thrombosis. Electronically Signed   By: Aletta Edouard M.D.   On: 12/26/2018 12:14   Dg Chest Portable 1 View  Result Date: 12/25/2018 CLINICAL DATA:  Weakness.  Headache.  Fever. EXAM: PORTABLE CHEST 1 VIEW COMPARISON:  Chest x-ray 07/11/2016. FINDINGS: Mediastinum and hilar structures normal. Lungs are clear. No pleural effusion or pneumothorax. Heart size normal. No acute bony abnormality. IMPRESSION: No acute cardiopulmonary disease. Electronically Signed   By: Marcello Moores  Register   On: 12/25/2018 10:17   ASSESSMENT AND PLAN:   53 y.o. female with pertinent past medical history of CAD, hypertension, hyperlipidemia, and MI presenting to the ED with chief complaints of generalized fatigue, fevers and chills since 12/26/2018  1. Sepsis - Patient presenting with fever 100.6, tachycardia, hypotension, elevated lactic acid, leukocytosis and elevated procalcitonin. - Likely secondary to UTI - Blood cultures pending - IVFs   2. UTI - UA shows UTI - Urine culture shows E.Coli - Continue IV ceftriaxone  3. Hypokalemia - improved with IV replacement -Check in a.m.  4.  Elevated D-dimer - No evidence of DIC, hemolysis or signs of PE - Likely due to sepsis - check Haptoglobin, LDH, Fibrinogen, PT/INR - US venous bilaterally - Oncology consult  5.  AKI - Prerenal - Avoid nephrotoxins - IVFs - Continue to monitor renal function  6. Coronary Artery Disease -in negative artery, right CEA, LAD, and LCx, history of MI with DES to RCA - ASA 81mg  PO daily - Clopidogrel 75mg  PO daily  - HTN, HLD, DM control as below  7. HLD  + Goal LDL<100 - Atorvastatin 40mg   PO qhs  8. HTN = now hypotensive + Goal BP <140/90 -Hold BP meds in the setting of sepsis -We will resume once BP stable  9. DVT prophylaxis - Heparin SubQ    All the records are reviewed and case discussed with Care Management/Social Worker. Management plans discussed with the patient, family and they are in agreement.  CODE STATUS: Full Code  TOTAL TIME TAKING CARE OF THIS PATIENT: 38 minutes.   More than 50% of the time was spent in counseling/coordination of care: YES  POSSIBLE D/C IN 1 DAYS, DEPENDING ON CLINICAL CONDITION.   on 12/26/2018 at 1:22 PM  Rufina Falco, DNP, FNP-BC [facility] Nurse Practitioner Between 7am to 6pm - Pager - [phone]  After 6pm go to [email or web address] - password [email or web address] [facility]  Office  [phone]  CC: Primary care physician; Perrin Maltese, MD  Note: This dictation was prepared with Dragon dictation along with smaller phrase technology. Any transcriptional errors that result from this process are unintentional.

## 2018-12-26 NOTE — Consult Note (Signed)
Hematology/Oncology Consult note [facility] Telephone:([phone] [phone]  Patient Care Team: Perrin Maltese, MD as PCP - General (Internal Medicine) Rico Junker, RN as Registered Nurse Theodore Demark, RN as Registered Nurse   Name of the patient: Sarah Medina  [id number]  04/09/1966   Date of visit: 12/26/18 REASON FOR COSULTATION:  Elevated d-dimer  History of presenting illness-  53 y.o. female with PMH listed at below who presents to ER for evaluation of generalized weakness and fever.  she also had some dysuria symptoms.  Denies any shortness of breath, cough, chest pain or diarrhea. In the emergency room she had a temperature of 100.6, tachycardic. Labs reviewed pro calcitonin 7.1, bilirubin 1.3, lactic acid 2.2, UA positive for urinary tract infection. She was admitted for sepsis.   In the emergency room, for unclear reason, d-dimer was checked and was found to be significantly elevated. I was called by ER 8 AM on 12/25/2020 discussed the case.  ER physician consulted hematology oncology for an opinion of patient's extremely elevated d-dimer, since patient's left lower extremity ultrasound is negative and the patient has no breathing difficulties to warrant a PE study. Advised to check PT PTT,  fibrinogen to rule out DIC given the sepsis picture, LDH, haptoglobin, to rule out hemolysis. Obtain bilateral lower extremity ultrasound to rule out DVT.  Regarding if patient needs urgent CT chest angiogram, I discussed with the ER physician that since I have not evaluated the patient yet.  It is ER physician's judgment for the next management plan after obtaining lab result to ruling or rule out thrombosis.   Patient was seen and examined at the bedside this afternoon.  She reports feeling little better since admitted. Physical examination, I found that patient did have left lower extremity swelling, she reports that leg has been warm and  swelling for about 2 days.  Denies any chronic history of leg swelling or pain.  Ultrasound venous bilateral lower extremity have been already obtained which were both negative for DVT. She denies any previous history of blood clots or any family history of blood clots.   Review of Systems  Constitutional: Positive for chills, fatigue and fever. Negative for appetite change.  HENT:   Negative for hearing loss and voice change.   Eyes: Negative for eye problems.  Respiratory: Negative for chest tightness and cough.   Cardiovascular: Positive for leg swelling. Negative for chest pain.  Gastrointestinal: Negative for abdominal distention, abdominal pain and blood in stool.  Endocrine: Negative for hot flashes.  Genitourinary: Negative for difficulty urinating and frequency.   Musculoskeletal: Negative for arthralgias.  Skin: Negative for itching and rash.  Neurological: Negative for extremity weakness.  Hematological: Negative for adenopathy.  Psychiatric/Behavioral: Negative for confusion.    Allergies  Allergen Reactions   Enalapril Swelling    Swelling face    Patient Active Problem List   Diagnosis Date Noted   Sepsis due to urinary tract infection ([place]) 12/25/2018   Chest pain, rule out acute myocardial infarction 07/11/2016   Ischemic heart disease 10/20/2015   Essential hypertension 10/20/2015   Hyperlipidemia 10/20/2015   Acute posthemorrhagic anemia    H/O myocardial infarction less than 8 weeks- STEMI inf wall 07/20/15- stent to RCA 07/23/2015   CAD in native artery, RCA, LAD and LCX 07/23/2015   Ventricular fibrillation ([place]) 07/23/2015   ST elevation myocardial infarction (STEMI) of inferior wall ([facility]) 07/20/2015   Abnormal LFTs 12/23/2014   Osteoarthritis of right  knee 11/24/2014     Past Medical History:  Diagnosis Date   Arthritis    hips, right leg   CAD in native artery, RCA, LAD and LCX 07/23/2015   Carotid artery occlusion    H/O  myocardial infarction less than 8 weeks- STEMI inf wall 07/20/15- stent to RCA 07/20/15   inf wall STEMI with DES to RCA   Hypercholesteremia    Hypertension    Periorbital edema    treated with steroids   Seizures ([place])    as teenager, ages [age].  none since   STEMI (ST elevation myocardial infarction) ([place]) 07/23/15     Past Surgical History:  Procedure Laterality Date   CARDIAC CATHETERIZATION N/A 07/20/2015   Procedure: Left Heart Cath and Coronary Angiography;  Surgeon: Isaias Cowman, MD;  Location: [place] [facility];  Service: Cardiovascular;  Laterality: N/A;   CARDIAC CATHETERIZATION N/A 07/20/2015   Procedure: Coronary Stent Intervention;  Surgeon: Isaias Cowman, MD;  Location: [place] [facility];  Service: Cardiovascular;  Laterality: N/A;   CARDIAC CATHETERIZATION N/A 07/23/2015   Procedure: Left Heart Cath and Coronary Angiography;  Surgeon: Lorretta Harp, MD;  Location: [place] [facility];  Service: Cardiovascular;  Laterality: N/A;   CARDIAC CATHETERIZATION N/A 07/23/2015   Procedure: Coronary Balloon Angioplasty;  Surgeon: Lorretta Harp, MD;  Location: [place] [facility];  Service: Cardiovascular;  Laterality: N/A;   CARDIAC CATHETERIZATION N/A 07/12/2016   Procedure: Left Heart Cath;  Surgeon: Teodoro Spray, MD;  Location: [place] [facility];  Service: Cardiovascular;  Laterality: N/A;   CARDIAC CATHETERIZATION N/A 07/12/2016   Procedure: Left Heart Cath and Coronary Angiography;  Surgeon: Isaias Cowman, MD;  Location: [place] [facility];  Service: Cardiovascular;  Laterality: N/A;   CARDIAC CATHETERIZATION N/A 07/12/2016   Procedure: Intravascular Pressure Wire/FFR Study;  Surgeon: Isaias Cowman, MD;  Location: [place] [facility];  Service: Cardiovascular;  Laterality: N/A;   CARDIAC CATHETERIZATION N/A 07/12/2016   Procedure: Coronary Stent Intervention;  Surgeon: Isaias Cowman, MD;  Location: [place] [facility];   Service: Cardiovascular;  Laterality: N/A;   KNEE SURGERY Right 15 years ago    Social History   Socioeconomic History   Marital status: Married    Spouse name: Not on file   Number of children: Not on file   Years of education: Not on file   Highest education level: Not on file  Occupational History   Not on file  Social Needs   Financial resource strain: Not on file   Food insecurity    Worry: Not on file    Inability: Not on file   Transportation needs    Medical: Not on file    Non-medical: Not on file  Tobacco Use   Smoking status: Never Smoker   Smokeless tobacco: Former Systems developer    Types: Snuff  Substance and Sexual Activity   Alcohol use: No   Drug use: Never   Sexual activity: Not on file  Lifestyle   Physical activity    Days per week: Not on file    Minutes per session: Not on file   Stress: Not on file  Relationships   Social connections    Talks on phone: Not on file    Gets together: Not on file    Attends religious service: Not on file    Active member of club or organization: Not on file    Attends meetings of clubs or organizations: Not on file  Relationship status: Not on file   Intimate partner violence    Fear of current or ex partner: Not on file    Emotionally abused: Not on file    Physically abused: Not on file    Forced sexual activity: Not on file  Other Topics Concern   Not on file  Social History Narrative   Not on file     Family History  Problem Relation Age of Onset   Breast cancer Sister 36     Current Facility-Administered Medications:    0.9 %  sodium chloride infusion, , Intravenous, Continuous, Lance Coon, MD, Last Rate: 125 mL/hr at 12/26/18 1306   acetaminophen (TYLENOL) tablet 650 mg, 650 mg, Oral, Q6H PRN, Harrie Foreman, MD, 650 mg at 12/26/18 5366   aspirin EC tablet 81 mg, 81 mg, Oral, Daily, Lang Snow, NP, 81 mg at 12/26/18 0953   atorvastatin (LIPITOR) tablet 40 mg,  40 mg, Oral, q1800, Ouma, Bing Neighbors, NP   cefTRIAXone (ROCEPHIN) 1 g in sodium chloride 0.9 % 100 mL IVPB, 1 g, Intravenous, Q24H, Paduchowski, Lennette Bihari, MD, Last Rate: 200 mL/hr at 12/26/18 1308, 1 g at 12/26/18 1308   clopidogrel (PLAVIX) tablet 75 mg, 75 mg, Oral, Daily, Ouma, Bing Neighbors, NP, 75 mg at 12/26/18 0953   fluticasone (FLONASE) 50 MCG/ACT nasal spray 1 spray, 1 spray, Each Nare, Daily, Ouma, Bing Neighbors, NP, 1 spray at 12/26/18 0953   heparin injection 5,000 Units, 5,000 Units, Subcutaneous, Q8H, Lance Coon, MD, 5,000 Units at 12/26/18 1651   loratadine (CLARITIN) tablet 10 mg, 10 mg, Oral, Daily, Ouma, Bing Neighbors, NP   medroxyPROGESTERone (PROVERA) tablet 10 mg, 10 mg, Oral, Daily, Ouma, Bing Neighbors, NP   nitroGLYCERIN (NITROSTAT) SL tablet 0.4 mg, 0.4 mg, Sublingual, Q5 Min x 3 PRN, Ouma, Bing Neighbors, NP   pantoprazole (PROTONIX) EC tablet 40 mg, 40 mg, Oral, Daily, Ouma, Bing Neighbors, NP, 40 mg at 12/25/18 1441   Vitamin D (Ergocalciferol) (DRISDOL) capsule 50,000 Units, 50,000 Units, Oral, Q7 days, Lang Snow, NP   Physical exam:  Vitals:   12/26/18 0420 12/26/18 0607 12/26/18 1514 12/26/18 1918  BP: (!) 117/57  (!) 94/50 (!) 111/57  Pulse: 90  99 92  Resp: 17  20 20   Temp: (!) 101.1 F (38.4 C) 99.5 F (37.5 C) 99.3 F (37.4 C) 98.8 F (37.1 C)  TempSrc: Oral Oral Oral Oral  SpO2: 97%  98% 99%  Weight:      Height:       Physical Exam  Constitutional: She is oriented to person, place, and time. No distress.  HENT:  Head: Normocephalic and atraumatic.  Mouth/Throat: No oropharyngeal exudate.  Eyes: Pupils are equal, round, and reactive to light. EOM are normal. No scleral icterus.  Neck: Normal range of motion. Neck supple.  Cardiovascular: Normal rate and regular rhythm.  No murmur heard. Pulmonary/Chest: Effort normal. No respiratory distress.  Abdominal: Soft.  Musculoskeletal: Normal range  of motion.        General: No edema.     Comments: Left lower extremity warmth and swelling.  Neurological: She is alert and oriented to person, place, and time. No cranial nerve deficit. She exhibits normal muscle tone. Coordination normal.  Skin: Skin is warm and dry. No erythema.  Psychiatric: Affect normal.        CMP Latest Ref Rng & Units 12/26/2018  Glucose 70 - 99 mg/dL 91  BUN 6 - 20 mg/dL 21(H)  Creatinine 0.44 -  1.00 mg/dL 1.08(H)  Sodium 135 - 145 mmol/L 139  Potassium 3.5 - 5.1 mmol/L 3.4(L)  Chloride 98 - 111 mmol/L 106  CO2 22 - 32 mmol/L 23  Calcium 8.9 - 10.3 mg/dL 7.8(L)  Total Protein 6.5 - 8.1 g/dL -  Total Bilirubin 0.3 - 1.2 mg/dL -  Alkaline Phos 38 - 126 U/L -  AST 15 - 41 U/L -  ALT 0 - 44 U/L -   CBC Latest Ref Rng & Units 12/26/2018  WBC 4.0 - 10.5 K/uL 14.5(H)  Hemoglobin 12.0 - 15.0 g/dL 10.1(L)  Hematocrit 36.0 - 46.0 % 32.3(L)  Platelets 150 - 400 K/uL 232   RADIOGRAPHIC STUDIES: I have personally reviewed the radiological images as listed and agreed with the findings in the report.  Ct Head Wo Contrast  Result Date: 12/25/2018 CLINICAL DATA:  Weakness and fever with headache EXAM: CT HEAD WITHOUT CONTRAST TECHNIQUE: Contiguous axial images were obtained from the base of the skull through the vertex without intravenous contrast. COMPARISON:  Head CT 09/08/2015 FINDINGS: Brain: There is no mass, hemorrhage or extra-axial collection. The size and configuration of the ventricles and extra-axial CSF spaces are normal. The brain parenchyma is normal, without acute or chronic infarction. Vascular: No abnormal hyperdensity of the major intracranial arteries or dural venous sinuses. No intracranial atherosclerosis. Skull: The visualized skull base, calvarium and extracranial soft tissues are normal. Sinuses/Orbits: No fluid levels or advanced mucosal thickening of the visualized paranasal sinuses. No mastoid or middle ear effusion. The orbits are normal.  IMPRESSION: Normal head CT. Electronically Signed   By: Ulyses Jarred M.D.   On: 12/25/2018 21:24   Mr Shoulder Right Wo Contrast  Result Date: 12/18/2018 CLINICAL DATA:  Right lateral neck pain radiating into the right shoulder and arm for the past 3 weeks. Limited shoulder range of motion. EXAM: MRI OF THE RIGHT SHOULDER WITHOUT CONTRAST TECHNIQUE: Multiplanar, multisequence MR imaging of the shoulder was performed. No intravenous contrast was administered. COMPARISON:  None. FINDINGS: Rotator cuff: Mild to moderate supraspinatus tendinosis with low-grade partial-thickness articular surface tear of the mid to distal tendon. The infraspinatus, teres minor, and subscapularis tendons are unremarkable. Muscles: No atrophy or abnormal signal of the muscles of the rotator cuff. Biceps long head: Intact and normally positioned. Mild intra-articular tendinosis. Acromioclavicular Joint: Mild arthropathy of the acromioclavicular joint. Type II acromion. Trace fluid in the subacromial/subdeltoid bursa. Glenohumeral Joint: No joint effusion. No chondral defect. Labrum: Grossly intact, but evaluation is limited by lack of intraarticular fluid. Bones:  No marrow abnormality, fracture or dislocation. Other: None. IMPRESSION: 1. Mild to moderate supraspinatus tendinosis with low-grade partial-thickness articular surface tear of the mid to distal tendon. 2. Mild intra-articular biceps tendinosis. 3. Mild acromioclavicular osteoarthritis. Electronically Signed   By: Titus Dubin M.D.   On: 12/18/2018 15:06   US Venous Img Lower Unilateral Left  Result Date: 12/26/2018 CLINICAL DATA:  53 year old female with left leg swelling. EXAM: Left LOWER EXTREMITY VENOUS DOPPLER ULTRASOUND TECHNIQUE: Gray-scale sonography with graded compression, as well as color Doppler and duplex ultrasound were performed to evaluate the lower extremity deep venous systems from the level of the common femoral vein and including the common femoral,  femoral, profunda femoral, popliteal and calf veins including the posterior tibial, peroneal and gastrocnemius veins when visible. The superficial great saphenous vein was also interrogated. Spectral Doppler was utilized to evaluate flow at rest and with distal augmentation maneuvers in the common femoral, femoral and popliteal veins. COMPARISON:  Ultrasound  dated 05/23/2016 FINDINGS: Contralateral Common Femoral Vein: Respiratory phasicity is normal and symmetric with the symptomatic side. No evidence of thrombus. Normal compressibility. Common Femoral Vein: No evidence of thrombus. Normal compressibility, respiratory phasicity and response to augmentation. Saphenofemoral Junction: No evidence of thrombus. Normal compressibility and flow on color Doppler imaging. Profunda Femoral Vein: No evidence of thrombus. Normal compressibility and flow on color Doppler imaging. Femoral Vein: No evidence of thrombus. Normal compressibility, respiratory phasicity and response to augmentation. Popliteal Vein: No evidence of thrombus. Normal compressibility, respiratory phasicity and response to augmentation. Calf Veins: No evidence of thrombus. Normal compressibility and flow on color Doppler imaging. Superficial Great Saphenous Vein: No evidence of thrombus. Normal compressibility. Venous Reflux:  None. Other Findings:  None. IMPRESSION: No evidence of deep venous thrombosis. Electronically Signed   By: Anner Crete M.D.   On: 12/26/2018 00:46   US Venous Img Lower Unilateral Right  Result Date: 12/26/2018 CLINICAL DATA:  Right lower extremity pain and edema. Elevated D-dimer. EXAM: RIGHT LOWER EXTREMITY VENOUS DOPPLER ULTRASOUND TECHNIQUE: Gray-scale sonography with graded compression, as well as color Doppler and duplex ultrasound were performed to evaluate the lower extremity deep venous systems from the level of the common femoral vein and including the common femoral, femoral, profunda femoral, popliteal and calf  veins including the posterior tibial, peroneal and gastrocnemius veins when visible. The superficial great saphenous vein was also interrogated. Spectral Doppler was utilized to evaluate flow at rest and with distal augmentation maneuvers in the common femoral, femoral and popliteal veins. COMPARISON:  Left lower extremity study on 12/25/2018 FINDINGS: Contralateral Common Femoral Vein: Respiratory phasicity is normal and symmetric with the symptomatic side. No evidence of thrombus. Normal compressibility. Common Femoral Vein: No evidence of thrombus. Normal compressibility, respiratory phasicity and response to augmentation. Saphenofemoral Junction: No evidence of thrombus. Normal compressibility and flow on color Doppler imaging. Profunda Femoral Vein: No evidence of thrombus. Normal compressibility and flow on color Doppler imaging. Femoral Vein: No evidence of thrombus. Normal compressibility, respiratory phasicity and response to augmentation. Popliteal Vein: No evidence of thrombus. Normal compressibility, respiratory phasicity and response to augmentation. Calf Veins: No evidence of thrombus. Normal compressibility and flow on color Doppler imaging. Superficial Great Saphenous Vein: No evidence of thrombus. Normal compressibility. Venous Reflux:  None. Other Findings: No evidence of superficial thrombophlebitis or abnormal fluid collection. IMPRESSION: No evidence of right lower extremity deep venous thrombosis. Electronically Signed   By: Aletta Edouard M.D.   On: 12/26/2018 12:14   Dg Chest Portable 1 View  Result Date: 12/25/2018 CLINICAL DATA:  Weakness.  Headache.  Fever. EXAM: PORTABLE CHEST 1 VIEW COMPARISON:  Chest x-ray 07/11/2016. FINDINGS: Mediastinum and hilar structures normal. Lungs are clear. No pleural effusion or pneumothorax. Heart size normal. No acute bony abnormality. IMPRESSION: No acute cardiopulmonary disease. Electronically Signed   By: Marcello Moores  Register   On: 12/25/2018 10:17     Assessment and plan- Patient is a 53 y.o. female presented with fever and a general weakness.  #UTI/sepsis, blood cultures pending.  Urine culture shows E. coli.  Continue IV ceftriaxone. #Elevated d-dimer, left lower extremity swelling and warmth cellulitis versus thrombosis. Left lower extremity ultrasound venous duplex negative for thrombosis.  Questionable higher level thrombosis Other etiology of elevated d-dimer, possible secondary to sepsis.  Discussed with primary team, recommend obtaining abdominal pelvis image for further evaluation, and vascular surgery evaluation. This is cellulitis, patient has been on antibiotics with ceftriaxone. Patient denies any breathing difficulties or chest pain.  Discussed with patient and she agrees with holding CT angiogram at this point.  If she develops symptoms in the future, will obtain.  DVT prophylaxis with heparin subcu.   Thank you for allowing me to participate in the care of this patient.  Total face to face encounter time for this patient visit was 70 min. >50% of the time was  spent in counseling and coordination of care.    Earlie Server, MD, PhD Hematology Oncology [facility] Pager- [phone] 12/26/2018

## 2018-12-27 ENCOUNTER — Inpatient Hospital Stay: Payer: Medicare Other

## 2018-12-27 DIAGNOSIS — I10 Essential (primary) hypertension: Secondary | ICD-10-CM

## 2018-12-27 DIAGNOSIS — M7989 Other specified soft tissue disorders: Secondary | ICD-10-CM

## 2018-12-27 DIAGNOSIS — Z79899 Other long term (current) drug therapy: Secondary | ICD-10-CM

## 2018-12-27 DIAGNOSIS — Z7982 Long term (current) use of aspirin: Secondary | ICD-10-CM

## 2018-12-27 DIAGNOSIS — L03116 Cellulitis of left lower limb: Secondary | ICD-10-CM

## 2018-12-27 DIAGNOSIS — N390 Urinary tract infection, site not specified: Secondary | ICD-10-CM

## 2018-12-27 DIAGNOSIS — A419 Sepsis, unspecified organism: Secondary | ICD-10-CM

## 2018-12-27 DIAGNOSIS — E785 Hyperlipidemia, unspecified: Secondary | ICD-10-CM

## 2018-12-27 LAB — CBC WITH DIFFERENTIAL/PLATELET
Abs Immature Granulocytes: 0.05 10*3/uL (ref 0.00–0.07)
Basophils Absolute: 0 10*3/uL (ref 0.0–0.1)
Basophils Relative: 0 %
Eosinophils Absolute: 0.1 10*3/uL (ref 0.0–0.5)
Eosinophils Relative: 1 %
HCT: 29.6 % — ABNORMAL LOW (ref 36.0–46.0)
Hemoglobin: 9.5 g/dL — ABNORMAL LOW (ref 12.0–15.0)
Immature Granulocytes: 1 %
Lymphocytes Relative: 16 %
Lymphs Abs: 1.8 10*3/uL (ref 0.7–4.0)
MCH: 27.2 pg (ref 26.0–34.0)
MCHC: 32.1 g/dL (ref 30.0–36.0)
MCV: 84.8 fL (ref 80.0–100.0)
Monocytes Absolute: 0.8 10*3/uL (ref 0.1–1.0)
Monocytes Relative: 8 %
Neutro Abs: 8 10*3/uL — ABNORMAL HIGH (ref 1.7–7.7)
Neutrophils Relative %: 74 %
Platelets: 235 10*3/uL (ref 150–400)
RBC: 3.49 MIL/uL — ABNORMAL LOW (ref 3.87–5.11)
RDW: 16.2 % — ABNORMAL HIGH (ref 11.5–15.5)
WBC: 10.8 10*3/uL — ABNORMAL HIGH (ref 4.0–10.5)
nRBC: 0 % (ref 0.0–0.2)

## 2018-12-27 LAB — URINE CULTURE: Culture: >=100000 — AB

## 2018-12-27 LAB — BASIC METABOLIC PANEL
Anion gap: 8 (ref 5–15)
BUN: 11 mg/dL (ref 6–20)
CO2: 25 mmol/L (ref 22–32)
Calcium: 7.6 mg/dL — ABNORMAL LOW (ref 8.9–10.3)
Chloride: 107 mmol/L (ref 98–111)
Creatinine, Ser: 0.82 mg/dL (ref 0.44–1.00)
GFR calc Af Amer: >60 mL/min (ref >60)
GFR calc non Af Amer: >60 mL/min (ref >60)
Glucose, Bld: 91 mg/dL (ref 70–99)
Potassium: 3.4 mmol/L — ABNORMAL LOW (ref 3.5–5.1)
Sodium: 140 mmol/L (ref 135–145)

## 2018-12-27 LAB — HAPTOGLOBIN: Haptoglobin: 462 mg/dL — ABNORMAL HIGH (ref 33–346)

## 2018-12-27 MED ORDER — IOHEXOL 350 MG/ML SOLN
150.0000 mL | Freq: Once | INTRAVENOUS | Status: AC | PRN
  Administered 2018-12-27: 150 mL via INTRAVENOUS

## 2018-12-27 MED ORDER — VANCOMYCIN HCL 10 G IV SOLR
2000.0000 mg | INTRAVENOUS | Status: DC
  Administered 2018-12-28: 2000 mg via INTRAVENOUS
  Filled 2018-12-27 (×2): qty 2000

## 2018-12-27 MED ORDER — VANCOMYCIN HCL 10 G IV SOLR
2500.0000 mg | Freq: Once | INTRAVENOUS | Status: AC
  Administered 2018-12-27: 18:00:00 2500 mg via INTRAVENOUS
  Filled 2018-12-27: qty 2500

## 2018-12-27 NOTE — Progress Notes (Signed)
   12/27/18 1100  Clinical Encounter Type  Visited With Patient  Visit Type Initial  Ch was rounding. Pt said she feels great. Pt complained of the room being too cold and was coughing a little bit so the ch turned up the temperature. Pt is in good mood and laughed with the ch as ch used humor.

## 2018-12-27 NOTE — Discharge Instructions (Signed)
Vascular Surgery Discharge Instructions 1) Recommend medical grade one compression socks (20-49mmgh) knee high worn everyday. Placed in the AM removed in the PM. 2) Elevation of lower extremities heart level or higher as much as possible.

## 2018-12-27 NOTE — Consult Note (Signed)
Date of Consultation:  12/27/2018  Requesting Physician:  Rufina Falco, NP  Reason for Consultation:  Left lower extremity swelling  History of Present Illness: Sarah Medina is a 53 y.o. female admitted two days ago with weakness, generalized fatigue, fevers, and chills.  Concern initially was for sepsis due to UTI and was started on ceftriaxone.  Patient reports that on the day that she came to ED, she also noted that her left leg was more swollen and heavier.  Over the past two days, she has developed left lower extremity swelling and cellulitis.  She had an U/S yesterday of both lower extremities which showed no DVT.  However, her cellulitis worsened today as well as her swelling.  The patient presented with WBC of 16.7, lactic acid which peaked at 2.3, her urine culture revealed E coli.  D-dimer was elevated at 1381.  Oncology has been consulted and Vascular surgery as well.  Surgery is consulted because of her worsening cellulitis and edema, for evaluation of possible necrotizing fasciitis.    Over her stay, while on ceftriaxone, her WBC has improved to 10.8, lactic acid normalized to 1.3.  Past Medical History: Past Medical History:  Diagnosis Date  . Arthritis    hips, right leg  . CAD in native artery, RCA, LAD and LCX 07/23/2015  . Carotid artery occlusion   . H/O myocardial infarction less than 8 weeks- STEMI inf wall 07/20/15- stent to RCA 07/20/15   inf wall STEMI with DES to RCA  . Hypercholesteremia   . Hypertension   . Periorbital edema    treated with steroids  . Seizures ([place])    as teenager, ages [age].  none since  . STEMI (ST elevation myocardial infarction) ([place]) 07/23/15     Past Surgical History: Past Surgical History:  Procedure Laterality Date  . CARDIAC CATHETERIZATION N/A 07/20/2015   Procedure: Left Heart Cath and Coronary Angiography;  Surgeon: Isaias Cowman, MD;  Location: [facility];  Service: Cardiovascular;  Laterality: N/A;  .  CARDIAC CATHETERIZATION N/A 07/20/2015   Procedure: Coronary Stent Intervention;  Surgeon: Isaias Cowman, MD;  Location: [place];  Service: Cardiovascular;  Laterality: N/A;  . CARDIAC CATHETERIZATION N/A 07/23/2015   Procedure: Left Heart Cath and Coronary Angiography;  Surgeon: Lorretta Harp, MD;  Location: [place] [facility] [facility];  Service: Cardiovascular;  Laterality: N/A;  . CARDIAC CATHETERIZATION N/A 07/23/2015   Procedure: Coronary Balloon Angioplasty;  Surgeon: Lorretta Harp, MD;  Location: [facility] [facility];  Service: Cardiovascular;  Laterality: N/A;  . CARDIAC CATHETERIZATION N/A 07/12/2016   Procedure: Left Heart Cath;  Surgeon: Teodoro Spray, MD;  Location: [facility];  Service: Cardiovascular;  Laterality: N/A;  . CARDIAC CATHETERIZATION N/A 07/12/2016   Procedure: Left Heart Cath and Coronary Angiography;  Surgeon: Isaias Cowman, MD;  Location: [place];  Service: Cardiovascular;  Laterality: N/A;  . CARDIAC CATHETERIZATION N/A 07/12/2016   Procedure: Intravascular Pressure Wire/FFR Study;  Surgeon: Isaias Cowman, MD;  Location: [place] [facility];  Service: Cardiovascular;  Laterality: N/A;  . CARDIAC CATHETERIZATION N/A 07/12/2016   Procedure: Coronary Stent Intervention;  Surgeon: Isaias Cowman, MD;  Location: [place] [facility];  Service: Cardiovascular;  Laterality: N/A;  . KNEE SURGERY Right 15 years ago    Home Medications: Prior to Admission medications   Medication Sig Start Date End Date Taking? Authorizing Provider  aspirin EC 81 MG tablet Take 81 mg by mouth daily.  Yes [provider]  atorvastatin (LIPITOR) 80 MG tablet Take 1 tablet (80 mg total) by mouth daily at 6 PM. Patient taking differently: Take 40 mg by mouth daily at 6 PM.  07/27/15  Yes Eileen Stanford, PA-C  cholecalciferol (VITAMIN D) 400 units TABS tablet Take 50,000 Units by mouth every Monday.    Yes [provider]   clopidogrel (PLAVIX) 75 MG tablet Take 1 tablet (75 mg total) by mouth daily. 01/26/16  Yes Tawni Millers, MD  hydrochlorothiazide (HYDRODIURIL) 12.5 MG tablet Take 12.5 mg by mouth daily.   Yes [provider]  losartan (COZAAR) 50 MG tablet Take 50 mg by mouth daily.   Yes [provider]  metoprolol tartrate (LOPRESSOR) 25 MG tablet Take 0.5 tablets (12.5 mg total) by mouth 2 (two) times daily. 07/13/16  Yes Sudini, Alveta Heimlich, MD  nitroGLYCERIN (NITROSTAT) 0.4 MG SL tablet Place 1 tablet (0.4 mg total) under the tongue every 5 (five) minutes x 3 doses as needed for chest pain. 07/27/15  Yes Eileen Stanford, PA-C    Allergies: Allergies  Allergen Reactions  . Enalapril Swelling    Swelling face    Social History:  reports that she has never smoked. She quit smokeless tobacco use about 6 years ago.  Her smokeless tobacco use included snuff. She reports that she does not drink alcohol or use drugs.   Family History: Family History  Problem Relation Age of Onset  . Breast cancer Sister 66    Review of Systems: Review of Systems  Constitutional: Positive for chills, fever and malaise/fatigue.  HENT: Negative for hearing loss.   Respiratory: Negative for shortness of breath.   Cardiovascular: Negative for chest pain.  Gastrointestinal: Negative for abdominal pain, nausea and vomiting.  Genitourinary: Negative for dysuria.  Musculoskeletal: Negative for myalgias.       Left lower extremity swelling  Skin:       Cellulitis of left lower extremity  Neurological: Negative for dizziness.  Psychiatric/Behavioral: Negative for depression.    Physical Exam BP 102/60 (BP Location: Left Arm)   Pulse 93   Temp 99.2 F (37.3 C)   Resp 18   Ht 5\' 6"  (1.676 m)   Wt 127 kg   LMP 05/19/2015 (LMP Unknown)   SpO2 98%   BMI 45.19 kg/m  CONSTITUTIONAL: No acute distress HEENT:  Normocephalic, atraumatic, extraocular motion intact. NECK: Trachea is midline, and there  is no jugular venous distension. RESPIRATORY:  Lungs are clear, and breath sounds are equal bilaterally. Normal respiratory effort without pathologic use of accessory muscles. CARDIOVASCULAR: Heart is regular without murmurs, gallops, or rubs. GI: The abdomen is soft, obese, nondistended, nontender.  MUSCULOSKELETAL:  Left lower extremity has edema from knee down to foot.  There is no edema of the right lower extremity.  There is also cellulitis with skin warm to touch of the left lower leg from ankle to knee.  There are no wounds, ulcers, cuts.   SKIN: Skin turgor is normal. There are no pathologic skin lesions.  NEUROLOGIC:  Motor and sensation is grossly normal.  Cranial nerves are grossly intact. PSYCH:  Alert and oriented to person, place and time. Affect is normal.  Laboratory Analysis: Results for orders placed or performed during the hospital encounter of 12/25/18 (from the past 24 hour(s))  Basic metabolic panel     Status: Abnormal   Collection Time: 12/27/18  6:01 AM  Result Value Ref Range   Sodium 140 135 -  145 mmol/L   Potassium 3.4 (L) 3.5 - 5.1 mmol/L   Chloride 107 98 - 111 mmol/L   CO2 25 22 - 32 mmol/L   Glucose, Bld 91 70 - 99 mg/dL   BUN 11 6 - 20 mg/dL   Creatinine, Ser 0.82 0.44 - 1.00 mg/dL   Calcium 7.6 (L) 8.9 - 10.3 mg/dL   GFR calc non Af Amer >60 >60 mL/min   GFR calc Af Amer >60 >60 mL/min   Anion gap 8 5 - 15  CBC with Differential/Platelet     Status: Abnormal   Collection Time: 12/27/18  6:01 AM  Result Value Ref Range   WBC 10.8 (H) 4.0 - 10.5 K/uL   RBC 3.49 (L) 3.87 - 5.11 MIL/uL   Hemoglobin 9.5 (L) 12.0 - 15.0 g/dL   HCT 29.6 (L) 36.0 - 46.0 %   MCV 84.8 80.0 - 100.0 fL   MCH 27.2 26.0 - 34.0 pg   MCHC 32.1 30.0 - 36.0 g/dL   RDW 16.2 (H) 11.5 - 15.5 %   Platelets 235 150 - 400 K/uL   nRBC 0.0 0.0 - 0.2 %   Neutrophils Relative % 74 %   Neutro Abs 8.0 (H) 1.7 - 7.7 K/uL   Lymphocytes Relative 16 %   Lymphs Abs 1.8 0.7 - 4.0 K/uL    Monocytes Relative 8 %   Monocytes Absolute 0.8 0.1 - 1.0 K/uL   Eosinophils Relative 1 %   Eosinophils Absolute 0.1 0.0 - 0.5 K/uL   Basophils Relative 0 %   Basophils Absolute 0.0 0.0 - 0.1 K/uL   Immature Granulocytes 1 %   Abs Immature Granulocytes 0.05 0.00 - 0.07 K/uL    Imaging: Ct Tibia Fibula Left W Contrast  Result Date: 12/27/2018 CLINICAL DATA:  Left lower extremity pain and swelling. EXAM: CT OF THE LOWER RIGHT EXTREMITY WITH CONTRAST TECHNIQUE: Multidetector CT imaging of the lower right extremity was performed according to the standard protocol following intravenous contrast administration. COMPARISON:  None. CONTRAST:  156mL OMNIPAQUE IOHEXOL 350 MG/ML SOLN FINDINGS: Subcutaneous soft tissue swelling/edema/fluid mainly below the knee suggesting cellulitis. No discrete drainable rim enhancing soft tissue abscess is identified. The hip joint is maintained. Moderate degenerative changes are noted at the knee, mainly involving the medial compartment with joint space narrowing, osteophytic spurring and mild chondrocalcinosis. Small knee joint effusion. The ankle joint is maintained. No findings suspicious for septic arthritis. No findings for osteomyelitis involving the femur, tibia or fibula. No CT findings suspicious for myofasciitis or pyomyositis. The major venous structures are patent. No findings for deep venous thrombosis. The major arterial structures are also normal. No significant atherosclerotic disease. IMPRESSION: 1. CT findings suggest cellulitis involving the left lower extremity mainly below the knee. No discrete soft tissue abscess. 2. No findings for myofasciitis or pyomyositis. 3. No CT findings to suggest septic arthritis or osteomyelitis. 4. Unremarkable appearance of the major arterial vascular structures in the left lower extremity. No findings for deep venous thrombosis. Electronically Signed   By: Marijo Sanes M.D.   On: 12/27/2018 17:02    Assessment and  Plan: This is a 53 y.o. female with UTI and sepsis, now with worsening left lower extremity cellulitis and edema.  CT scan done today of her left lower extremity and CT venogram of abdmen/pelvis does not reveal a DVT and does not reveal necrotizing fasciitis.  I have independently viewed the images and agree with the findings.  She does have diffuse cellulitis extending  from knee level to ankle, but no fluid collection or abscess.  At this point, no acute surgical need, but would recommend broadening her antibiotic coverage to vancomycin instead of just ceftriaxone.  She still needs treatment for there UTI.  Continue left lower extremity elevation to help with edema.  Will continue to follow along with you.  Face-to-face time spent with the patient and care providers was 55 minutes, with more than 50% of the time spent counseling, educating, and coordinating care of the patient.     Melvyn Neth, MD [facility] Pg:  [phone]

## 2018-12-27 NOTE — Consult Note (Signed)
[facility] Vascular Consult Note  MRN : [id number]  Sarah Medina is a 53 y.o. (June 25, 1966) female who presents with chief complaint of  Chief Complaint  Patient presents with  . Weakness   History of Present Illness:  The patient is a 53 year old female with a past medical history of STEMI (07/23/15), seizures, hypertension, hypercholesterolemia, carotid artery occlusion, coronary artery disease, arthritis, chronic lower extremity edema who presented to [facility] emergency department via EMS complaining of "weakness" approximately 2 days ago.  The patient was also experiencing "fever" and "pain" behind "both of her eyes".  Noted one episode of nonbilious vomiting that morning.  Some mild dysuria.  Patient was admitted for UTI sepsis.  Patient with an elevated d-dimer and edematous left lower extremity.  Patient endorses worsening pain and erythema to the left lower extremity over the last 2 days.  Patient does note chronic mild swelling to the bilateral legs.  At this time, the patient does not engage in conservative therapy including wearing medical grade 1 compression socks or elevating her legs on a regular basis.  Bilateral venous duplex were notable for no DVT or SVT.  She denies any history of blood clots in the past.  Vascular surgery was consulted by NP Ouma for further recommendations. Current Facility-Administered Medications  Medication Dose Route Frequency Provider Last Rate Last Dose  . 0.9 %  sodium chloride infusion   Intravenous Continuous Lance Coon, MD 125 mL/hr at 12/27/18 0636    . acetaminophen (TYLENOL) tablet 650 mg  650 mg Oral Q6H PRN Harrie Foreman, MD   650 mg at 12/26/18 0438  . aspirin EC tablet 81 mg  81 mg Oral Daily Lang Snow, NP   81 mg at 12/27/18 0941  . atorvastatin (LIPITOR) tablet 40 mg  40 mg Oral q1800 Lang Snow, NP      . cefTRIAXone (ROCEPHIN) 1 g in sodium  chloride 0.9 % 100 mL IVPB  1 g Intravenous Q24H Harvest Dark, MD 200 mL/hr at 12/27/18 0943 1 g at 12/27/18 0943  . clopidogrel (PLAVIX) tablet 75 mg  75 mg Oral Daily Lang Snow, NP   75 mg at 12/27/18 0941  . fluticasone (FLONASE) 50 MCG/ACT nasal spray 1 spray  1 spray Each Nare Daily Lang Snow, NP   1 spray at 12/27/18 0941  . heparin injection 5,000 Units  5,000 Units Subcutaneous Camelia Phenes Lance Coon, MD   5,000 Units at 12/27/18 [phone]  . loratadine (CLARITIN) tablet 10 mg  10 mg Oral Daily Ouma, Bing Neighbors, NP      . medroxyPROGESTERone (PROVERA) tablet 10 mg  10 mg Oral Daily Ouma, Bing Neighbors, NP      . nitroGLYCERIN (NITROSTAT) SL tablet 0.4 mg  0.4 mg Sublingual Q5 Min x 3 PRN Lang Snow, NP      . pantoprazole (PROTONIX) EC tablet 40 mg  40 mg Oral Daily Lang Snow, NP   40 mg at 12/27/18 0941  . Vitamin D (Ergocalciferol) (DRISDOL) capsule 50,000 Units  50,000 Units Oral Q7 days Lang Snow, NP       Past Medical History:  Diagnosis Date  . Arthritis    hips, right leg  . CAD in native artery, RCA, LAD and LCX 07/23/2015  . Carotid artery occlusion   . H/O myocardial infarction less than 8 weeks- STEMI inf wall 07/20/15- stent to RCA 07/20/15   inf wall STEMI with DES  to RCA  . Hypercholesteremia   . Hypertension   . Periorbital edema    treated with steroids  . Seizures ([place])    as teenager, ages [age].  none since  . STEMI (ST elevation myocardial infarction) ([place]) 07/23/15   Past Surgical History:  Procedure Laterality Date  . CARDIAC CATHETERIZATION N/A 07/20/2015   Procedure: Left Heart Cath and Coronary Angiography;  Surgeon: Isaias Cowman, MD;  Location: [facility];  Service: Cardiovascular;  Laterality: N/A;  . CARDIAC CATHETERIZATION N/A 07/20/2015   Procedure: Coronary Stent Intervention;  Surgeon: Isaias Cowman, MD;  Location: [place] [facility];  Service:  Cardiovascular;  Laterality: N/A;  . CARDIAC CATHETERIZATION N/A 07/23/2015   Procedure: Left Heart Cath and Coronary Angiography;  Surgeon: Lorretta Harp, MD;  Location: [place] [facility];  Service: Cardiovascular;  Laterality: N/A;  . CARDIAC CATHETERIZATION N/A 07/23/2015   Procedure: Coronary Balloon Angioplasty;  Surgeon: Lorretta Harp, MD;  Location: [place] CV [facility];  Service: Cardiovascular;  Laterality: N/A;  . CARDIAC CATHETERIZATION N/A 07/12/2016   Procedure: Left Heart Cath;  Surgeon: Teodoro Spray, MD;  Location: [place] [facility];  Service: Cardiovascular;  Laterality: N/A;  . CARDIAC CATHETERIZATION N/A 07/12/2016   Procedure: Left Heart Cath and Coronary Angiography;  Surgeon: Isaias Cowman, MD;  Location: [place] [facility];  Service: Cardiovascular;  Laterality: N/A;  . CARDIAC CATHETERIZATION N/A 07/12/2016   Procedure: Intravascular Pressure Wire/FFR Study;  Surgeon: Isaias Cowman, MD;  Location: [place] [facility];  Service: Cardiovascular;  Laterality: N/A;  . CARDIAC CATHETERIZATION N/A 07/12/2016   Procedure: Coronary Stent Intervention;  Surgeon: Isaias Cowman, MD;  Location: [place] CV [facility];  Service: Cardiovascular;  Laterality: N/A;  . KNEE SURGERY Right 15 years ago   Social History Social History   Tobacco Use  . Smoking status: Never Smoker  . Smokeless tobacco: Former Systems developer    Types: Snuff  Substance Use Topics  . Alcohol use: No  . Drug use: Never   Family History Family History  Problem Relation Age of Onset  . Breast cancer Sister 77  Denies family history of peripheral artery disease, venous disease and/or bleeding/clotting disorders.  Allergies  Allergen Reactions  . Enalapril Swelling    Swelling face   REVIEW OF SYSTEMS (Negative unless checked)  Constitutional: [] Weight loss  [] Fever  [] Chills Cardiac: [] Chest pain   [] Chest pressure   [] Palpitations   [] Shortness of breath when laying flat   [] Shortness  of breath at rest   [x] Shortness of breath with exertion. Vascular:  [] Pain in legs with walking   [] Pain in legs at rest   [] Pain in legs when laying flat   [] Claudication   [] Pain in feet when walking  [] Pain in feet at rest  [] Pain in feet when laying flat   [] History of DVT   [] Phlebitis   [x] Swelling in legs   [] Varicose veins   [] Non-healing ulcers Pulmonary:   [] Uses home oxygen   [] Productive cough   [] Hemoptysis   [] Wheeze  [] COPD   [] Asthma Neurologic:  [] Dizziness  [] Blackouts   [] Seizures   [] History of stroke   [] History of TIA  [] Aphasia   [] Temporary blindness   [] Dysphagia   [] Weakness or numbness in arms   [] Weakness or numbness in legs Musculoskeletal:  [x] Arthritis   [] Joint swelling   [] Joint pain   [] Low back pain Hematologic:  [] Easy bruising  [] Easy bleeding   [] Hypercoagulable state   [] Anemic  []   Hepatitis Gastrointestinal:  [] Blood in stool   [] Vomiting blood  [] Gastroesophageal reflux/heartburn   [] Difficulty swallowing. Genitourinary:  [] Chronic kidney disease   [] Difficult urination  [] Frequent urination  [x] Burning with urination   [] Blood in urine Skin:  [] Rashes   [] Ulcers   [] Wounds Psychological:  [] History of anxiety   []  History of major depression.  Physical Examination  Vitals:   12/26/18 0607 12/26/18 1514 12/26/18 1918 12/27/18 0536  BP:  (!) 94/50 (!) 111/57 131/65  Pulse:  99 92 79  Resp:  20 20 17   Temp: 99.5 F (37.5 C) 99.3 F (37.4 C) 98.8 F (37.1 C) 99.8 F (37.7 C)  TempSrc: Oral Oral Oral Oral  SpO2:  98% 99% 98%  Weight:      Height:       Body mass index is 45.19 kg/m. Gen:  WD/WN, NAD Head: [place]/AT, No temporalis wasting. Prominent temp pulse not noted. Ear/Nose/Throat: Hearing grossly intact, nares w/o erythema or drainage, oropharynx w/o Erythema/Exudate Eyes: Sclera non-icteric, conjunctiva clear Neck: Trachea midline.  No JVD.  Pulmonary:  Good air movement, respirations not labored, equal bilaterally.  Cardiac: RRR, normal S1,  S2. Vascular:  Vessel Right Left  Radial Palpable Palpable  Ulnar Palpable Palpable  Brachial Palpable Palpable  Carotid Palpable, without bruit Palpable, without bruit  Aorta Not palpable N/A  Femoral Palpable Palpable  Popliteal Palpable Palpable  PT Palpable Palpable  DP Palpable Palpable   Left Lower Extremity: Thigh soft.  Calf soft.  There is moderate edema noted to the left lower extremity.  The left lower extremity is greater in size when compared to the right.  Skin with erythema.  Tender to palpation from the toes to approximately the knee.  There is no pain with dorsiflexion.  Skin is intact.  Gastrointestinal: soft, non-tender/non-distended. No guarding/reflex.  Musculoskeletal: M/S 5/5 throughout.  Extremities without ischemic changes.  No deformity or atrophy.  Neurologic: Sensation grossly intact in extremities.  Symmetrical.  Speech is fluent. Motor exam as listed above. Psychiatric: Judgment intact, Mood & affect appropriate for pt's clinical situation. Dermatologic: No rashes or ulcers noted.  No cellulitis or open wounds. Lymph : No Cervical, Axillary, or Inguinal lymphadenopathy.  CBC Lab Results  Component Value Date   WBC 10.8 (H) 12/27/2018   HGB 9.5 (L) 12/27/2018   HCT 29.6 (L) 12/27/2018   MCV 84.8 12/27/2018   PLT 235 12/27/2018   BMET    Component Value Date/Time   NA 140 12/27/2018 0601   NA 141 12/25/2011 1332   K 3.4 (L) 12/27/2018 0601   K 3.4 (L) 12/25/2011 1332   CL 107 12/27/2018 0601   CL 106 12/25/2011 1332   CO2 25 12/27/2018 0601   CO2 28 12/25/2011 1332   GLUCOSE 91 12/27/2018 0601   GLUCOSE 98 12/25/2011 1332   BUN 11 12/27/2018 0601   BUN 8 12/25/2011 1332   CREATININE 0.82 12/27/2018 0601   CREATININE 0.83 12/25/2011 1332   CALCIUM 7.6 (L) 12/27/2018 0601   CALCIUM 8.7 12/25/2011 1332   GFRNONAA >60 12/27/2018 0601   GFRNONAA >60 12/25/2011 1332   GFRAA >60 12/27/2018 0601   GFRAA >60 12/25/2011 1332   Estimated  Creatinine Clearance: 107 mL/min (by C-G formula based on SCr of 0.82 mg/dL).  COAG Lab Results  Component Value Date   INR 1.1 12/26/2018   INR 1.07 07/12/2016   INR 0.98 07/11/2016   Radiology Ct Head Wo Contrast  Result Date: 12/25/2018 CLINICAL DATA:  Weakness and  fever with headache EXAM: CT HEAD WITHOUT CONTRAST TECHNIQUE: Contiguous axial images were obtained from the base of the skull through the vertex without intravenous contrast. COMPARISON:  Head CT 09/08/2015 FINDINGS: Brain: There is no mass, hemorrhage or extra-axial collection. The size and configuration of the ventricles and extra-axial CSF spaces are normal. The brain parenchyma is normal, without acute or chronic infarction. Vascular: No abnormal hyperdensity of the major intracranial arteries or dural venous sinuses. No intracranial atherosclerosis. Skull: The visualized skull base, calvarium and extracranial soft tissues are normal. Sinuses/Orbits: No fluid levels or advanced mucosal thickening of the visualized paranasal sinuses. No mastoid or middle ear effusion. The orbits are normal. IMPRESSION: Normal head CT. Electronically Signed   By: Ulyses Jarred M.D.   On: 12/25/2018 21:24   Mr Shoulder Right Wo Contrast  Result Date: 12/18/2018 CLINICAL DATA:  Right lateral neck pain radiating into the right shoulder and arm for the past 3 weeks. Limited shoulder range of motion. EXAM: MRI OF THE RIGHT SHOULDER WITHOUT CONTRAST TECHNIQUE: Multiplanar, multisequence MR imaging of the shoulder was performed. No intravenous contrast was administered. COMPARISON:  None. FINDINGS: Rotator cuff: Mild to moderate supraspinatus tendinosis with low-grade partial-thickness articular surface tear of the mid to distal tendon. The infraspinatus, teres minor, and subscapularis tendons are unremarkable. Muscles: No atrophy or abnormal signal of the muscles of the rotator cuff. Biceps long head: Intact and normally positioned. Mild intra-articular  tendinosis. Acromioclavicular Joint: Mild arthropathy of the acromioclavicular joint. Type II acromion. Trace fluid in the subacromial/subdeltoid bursa. Glenohumeral Joint: No joint effusion. No chondral defect. Labrum: Grossly intact, but evaluation is limited by lack of intraarticular fluid. Bones:  No marrow abnormality, fracture or dislocation. Other: None. IMPRESSION: 1. Mild to moderate supraspinatus tendinosis with low-grade partial-thickness articular surface tear of the mid to distal tendon. 2. Mild intra-articular biceps tendinosis. 3. Mild acromioclavicular osteoarthritis. Electronically Signed   By: Titus Dubin M.D.   On: 12/18/2018 15:06   US Venous Img Lower Unilateral Left  Result Date: 12/26/2018 CLINICAL DATA:  54 year old female with left leg swelling. EXAM: Left LOWER EXTREMITY VENOUS DOPPLER ULTRASOUND TECHNIQUE: Gray-scale sonography with graded compression, as well as color Doppler and duplex ultrasound were performed to evaluate the lower extremity deep venous systems from the level of the common femoral vein and including the common femoral, femoral, profunda femoral, popliteal and calf veins including the posterior tibial, peroneal and gastrocnemius veins when visible. The superficial great saphenous vein was also interrogated. Spectral Doppler was utilized to evaluate flow at rest and with distal augmentation maneuvers in the common femoral, femoral and popliteal veins. COMPARISON:  Ultrasound dated 05/23/2016 FINDINGS: Contralateral Common Femoral Vein: Respiratory phasicity is normal and symmetric with the symptomatic side. No evidence of thrombus. Normal compressibility. Common Femoral Vein: No evidence of thrombus. Normal compressibility, respiratory phasicity and response to augmentation. Saphenofemoral Junction: No evidence of thrombus. Normal compressibility and flow on color Doppler imaging. Profunda Femoral Vein: No evidence of thrombus. Normal compressibility and flow on  color Doppler imaging. Femoral Vein: No evidence of thrombus. Normal compressibility, respiratory phasicity and response to augmentation. Popliteal Vein: No evidence of thrombus. Normal compressibility, respiratory phasicity and response to augmentation. Calf Veins: No evidence of thrombus. Normal compressibility and flow on color Doppler imaging. Superficial Great Saphenous Vein: No evidence of thrombus. Normal compressibility. Venous Reflux:  None. Other Findings:  None. IMPRESSION: No evidence of deep venous thrombosis. Electronically Signed   By: Anner Crete M.D.   On: 12/26/2018 00:46  US Venous Img Lower Unilateral Right  Result Date: 12/26/2018 CLINICAL DATA:  Right lower extremity pain and edema. Elevated D-dimer. EXAM: RIGHT LOWER EXTREMITY VENOUS DOPPLER ULTRASOUND TECHNIQUE: Gray-scale sonography with graded compression, as well as color Doppler and duplex ultrasound were performed to evaluate the lower extremity deep venous systems from the level of the common femoral vein and including the common femoral, femoral, profunda femoral, popliteal and calf veins including the posterior tibial, peroneal and gastrocnemius veins when visible. The superficial great saphenous vein was also interrogated. Spectral Doppler was utilized to evaluate flow at rest and with distal augmentation maneuvers in the common femoral, femoral and popliteal veins. COMPARISON:  Left lower extremity study on 12/25/2018 FINDINGS: Contralateral Common Femoral Vein: Respiratory phasicity is normal and symmetric with the symptomatic side. No evidence of thrombus. Normal compressibility. Common Femoral Vein: No evidence of thrombus. Normal compressibility, respiratory phasicity and response to augmentation. Saphenofemoral Junction: No evidence of thrombus. Normal compressibility and flow on color Doppler imaging. Profunda Femoral Vein: No evidence of thrombus. Normal compressibility and flow on color Doppler imaging. Femoral  Vein: No evidence of thrombus. Normal compressibility, respiratory phasicity and response to augmentation. Popliteal Vein: No evidence of thrombus. Normal compressibility, respiratory phasicity and response to augmentation. Calf Veins: No evidence of thrombus. Normal compressibility and flow on color Doppler imaging. Superficial Great Saphenous Vein: No evidence of thrombus. Normal compressibility. Venous Reflux:  None. Other Findings: No evidence of superficial thrombophlebitis or abnormal fluid collection. IMPRESSION: No evidence of right lower extremity deep venous thrombosis. Electronically Signed   By: Aletta Edouard M.D.   On: 12/26/2018 12:14   Dg Chest Portable 1 View  Result Date: 12/25/2018 CLINICAL DATA:  Weakness.  Headache.  Fever. EXAM: PORTABLE CHEST 1 VIEW COMPARISON:  Chest x-ray 07/11/2016. FINDINGS: Mediastinum and hilar structures normal. Lungs are clear. No pleural effusion or pneumothorax. Heart size normal. No acute bony abnormality. IMPRESSION: No acute cardiopulmonary disease. Electronically Signed   By: Marcello Moores  Register   On: 12/25/2018 10:17   Assessment/Plan The patient is a 53 year old female with a past medical history of STEMI (07/23/15), seizures, hypertension, hypercholesterolemia, carotid artery occlusion, coronary artery disease, arthritis, chronic lower extremity edema who presented to [facility] emergency department via EMS complaining of "weakness" approximately 2 days ago.  Admitted with UTI sepsis. 1.  Left lower extremity cellulitis: Patient endorses a history of chronic what she describes is mild lower extremity edema.  At this time, patient does not engage in conservative therapy including wearing medical grade 1 compression socks or elevation on a daily basis.  Physical exam with erythema and tenderness from the toes to the knee.  Left lower extremity venous duplex without SVT or DVT.  Chronic edema could be venous insufficiency versus  lymphedema.  Be happy to see in the outpatient setting to continue this work-up.  Recommend compression socks and elevation. Rocephin is broad spectrum and appropriate for cellulitis. No other vascular recommendations are indicated at this time.  2.  Hyperlipidemia: On ASA and statin for medical management. Encouraged good control as its slows the progression of atherosclerotic disease. 3.  Hypertension: On appropriate medications. Encouraged good control as its slows the progression of atherosclerotic disease.  Discussed with Schnier / Mayme Genta, PA-C  12/27/2018 12:22 PM  This note was created with Dragon medical transcription system.  Any error is purely unintentional

## 2018-12-27 NOTE — Progress Notes (Signed)
Hematology/Oncology Progress Note [facility] [phone] [phone]  Patient Care Team: Perrin Maltese, MD as PCP - General (Internal Medicine) Rico Junker, RN as Registered Nurse Theodore Demark, RN as Registered Nurse   Name of the patient: Sarah Medina  [id number]  23-Mar-1966  Date of visit: 12/27/18   INTERVAL HISTORY-  Reports doing better today.  Afebrile.  Denies any breathing difficulties. Left lower extremity erythema/warmth slightly better.  Was seen by vascular surgery.    Review of systems- Review of Systems  Constitutional: Negative for chills and fever.  Respiratory: Negative for chest tightness, cough and shortness of breath.   Cardiovascular: Positive for leg swelling. Negative for chest pain.  Gastrointestinal: Negative for abdominal distention and abdominal pain.  Genitourinary: Negative for difficulty urinating and dysuria.   Neurological: Negative for dizziness.  Hematological: Negative for adenopathy.  Psychiatric/Behavioral: The patient is not nervous/anxious.     Allergies  Allergen Reactions   Enalapril Swelling    Swelling face    Patient Active Problem List   Diagnosis Date Noted   Urinary tract infection without hematuria    Leg swelling    Positive D dimer    Sepsis ([place]) 12/25/2018   Chest pain, rule out acute myocardial infarction 07/11/2016   Ischemic heart disease 10/20/2015   Essential hypertension 10/20/2015   Hyperlipidemia 10/20/2015   Acute posthemorrhagic anemia    H/O myocardial infarction less than 8 weeks- STEMI inf wall 07/20/15- stent to RCA 07/23/2015   CAD in native artery, RCA, LAD and LCX 07/23/2015   Ventricular fibrillation ([place]) 07/23/2015   ST elevation myocardial infarction (STEMI) of inferior wall ([facility]) 07/20/2015   Abnormal LFTs 12/23/2014   Osteoarthritis of right knee 11/24/2014     Past Medical History:  Diagnosis Date   Arthritis      hips, right leg   CAD in native artery, RCA, LAD and LCX 07/23/2015   Carotid artery occlusion    H/O myocardial infarction less than 8 weeks- STEMI inf wall 07/20/15- stent to RCA 07/20/15   inf wall STEMI with DES to RCA   Hypercholesteremia    Hypertension    Periorbital edema    treated with steroids   Seizures ([place])    as teenager, ages [age].  none since   STEMI (ST elevation myocardial infarction) ([place]) 07/23/15     Past Surgical History:  Procedure Laterality Date   CARDIAC CATHETERIZATION N/A 07/20/2015   Procedure: Left Heart Cath and Coronary Angiography;  Surgeon: Isaias Cowman, MD;  Location: [facility];  Service: Cardiovascular;  Laterality: N/A;   CARDIAC CATHETERIZATION N/A 07/20/2015   Procedure: Coronary Stent Intervention;  Surgeon: Isaias Cowman, MD;  Location: [facility];  Service: Cardiovascular;  Laterality: N/A;   CARDIAC CATHETERIZATION N/A 07/23/2015   Procedure: Left Heart Cath and Coronary Angiography;  Surgeon: Lorretta Harp, MD;  Location: [place];  Service: Cardiovascular;  Laterality: N/A;   CARDIAC CATHETERIZATION N/A 07/23/2015   Procedure: Coronary Balloon Angioplasty;  Surgeon: Lorretta Harp, MD;  Location: [facility];  Service: Cardiovascular;  Laterality: N/A;   CARDIAC CATHETERIZATION N/A 07/12/2016   Procedure: Left Heart Cath;  Surgeon: Teodoro Spray, MD;  Location: [facility];  Service: Cardiovascular;  Laterality: N/A;   CARDIAC CATHETERIZATION N/A 07/12/2016   Procedure: Left Heart Cath and Coronary Angiography;  Surgeon: Isaias Cowman, MD;  Location: [facility];  Service: Cardiovascular;  Laterality: N/A;   CARDIAC CATHETERIZATION N/A 07/12/2016   Procedure: Intravascular Pressure Wire/FFR Study;  Surgeon: Isaias Cowman, MD;  Location: [facility];  Service: Cardiovascular;  Laterality: N/A;   CARDIAC CATHETERIZATION N/A 07/12/2016    Procedure: Coronary Stent Intervention;  Surgeon: Isaias Cowman, MD;  Location: [facility];  Service: Cardiovascular;  Laterality: N/A;   KNEE SURGERY Right 15 years ago    Social History   Socioeconomic History   Marital status: Married    Spouse name: Not on file   Number of children: Not on file   Years of education: Not on file   Highest education level: Not on file  Occupational History   Not on file  Social Needs   Financial resource strain: Not on file   Food insecurity    Worry: Not on file    Inability: Not on file   Transportation needs    Medical: Not on file    Non-medical: Not on file  Tobacco Use   Smoking status: Never Smoker   Smokeless tobacco: Former Systems developer    Types: Snuff  Substance and Sexual Activity   Alcohol use: No   Drug use: Never   Sexual activity: Not on file  Lifestyle   Physical activity    Days per week: Not on file    Minutes per session: Not on file   Stress: Not on file  Relationships   Social connections    Talks on phone: Not on file    Gets together: Not on file    Attends religious service: Not on file    Active member of club or organization: Not on file    Attends meetings of clubs or organizations: Not on file    Relationship status: Not on file   Intimate partner violence    Fear of current or ex partner: Not on file    Emotionally abused: Not on file    Physically abused: Not on file    Forced sexual activity: Not on file  Other Topics Concern   Not on file  Social History Narrative   Not on file     Family History  Problem Relation Age of Onset   Breast cancer Sister 67     Current Facility-Administered Medications:    0.9 %  sodium chloride infusion, , Intravenous, Continuous, Lance Coon, MD, Last Rate: 125 mL/hr at 12/27/18 0636   acetaminophen (TYLENOL) tablet 650 mg, 650 mg, Oral, Q6H PRN, Harrie Foreman, MD, 650 mg at 12/26/18 9924   aspirin EC tablet 81 mg, 81  mg, Oral, Daily, Ouma, Bing Neighbors, NP, 81 mg at 12/27/18 0941   atorvastatin (LIPITOR) tablet 40 mg, 40 mg, Oral, q1800, Ouma, Bing Neighbors, NP   cefTRIAXone (ROCEPHIN) 1 g in sodium chloride 0.9 % 100 mL IVPB, 1 g, Intravenous, Q24H, Paduchowski, Lennette Bihari, MD, Last Rate: 200 mL/hr at 12/27/18 0943, 1 g at 12/27/18 0943   clopidogrel (PLAVIX) tablet 75 mg, 75 mg, Oral, Daily, Ouma, Bing Neighbors, NP, 75 mg at 12/27/18 0941   fluticasone (FLONASE) 50 MCG/ACT nasal spray 1 spray, 1 spray, Each Nare, Daily, Ouma, Bing Neighbors, NP, 1 spray at 12/27/18 0941   heparin injection 5,000 Units, 5,000 Units, Subcutaneous, Q8H, Lance Coon, MD, 5,000 Units at 12/27/18 0626   loratadine (CLARITIN) tablet 10 mg, 10 mg, Oral, Daily, Ouma, Bing Neighbors, NP   medroxyPROGESTERone (PROVERA) tablet 10 mg, 10 mg, Oral, Daily, Ouma, Bing Neighbors, NP   nitroGLYCERIN (NITROSTAT)  SL tablet 0.4 mg, 0.4 mg, Sublingual, Q5 Min x 3 PRN, Ouma, Bing Neighbors, NP   pantoprazole (PROTONIX) EC tablet 40 mg, 40 mg, Oral, Daily, Ouma, Bing Neighbors, NP, 40 mg at 12/27/18 0941   Vitamin D (Ergocalciferol) (DRISDOL) capsule 50,000 Units, 50,000 Units, Oral, Q7 days, Lang Snow, NP   Physical exam:  Vitals:   12/26/18 1918 12/27/18 0536 12/27/18 1435 12/27/18 1436  BP: (!) 111/57 131/65  102/60  Pulse: 92 79  93  Resp: 20 17  18   Temp: 98.8 F (37.1 C) 99.8 F (37.7 C) 99.2 F (37.3 C)   TempSrc: Oral Oral    SpO2: 99% 98%  98%  Weight:      Height:       Physical Exam  Constitutional: She is oriented to person, place, and time. No distress.  HENT:  Head: Normocephalic and atraumatic.  Nose: Nose normal.  Mouth/Throat: Oropharynx is clear and moist. No oropharyngeal exudate.  Eyes: Pupils are equal, round, and reactive to light. EOM are normal. No scleral icterus.  Neck: Normal range of motion. Neck supple.  Cardiovascular: Normal rate and regular rhythm.    No murmur heard. Pulmonary/Chest: Effort normal. No respiratory distress.  Abdominal: Soft. She exhibits no distension. There is no abdominal tenderness.  Musculoskeletal: Normal range of motion.        General: No edema.     Comments: Left lower extremity warmth and swelling  Neurological: She is alert and oriented to person, place, and time.  Skin: Skin is warm and dry. She is not diaphoretic. No erythema.  Psychiatric: Affect normal.       CMP Latest Ref Rng & Units 12/27/2018  Glucose 70 - 99 mg/dL 91  BUN 6 - 20 mg/dL 11  Creatinine 0.44 - 1.00 mg/dL 0.82  Sodium 135 - 145 mmol/L 140  Potassium 3.5 - 5.1 mmol/L 3.4(L)  Chloride 98 - 111 mmol/L 107  CO2 22 - 32 mmol/L 25  Calcium 8.9 - 10.3 mg/dL 7.6(L)  Total Protein 6.5 - 8.1 g/dL -  Total Bilirubin 0.3 - 1.2 mg/dL -  Alkaline Phos 38 - 126 U/L -  AST 15 - 41 U/L -  ALT 0 - 44 U/L -   CBC Latest Ref Rng & Units 12/27/2018  WBC 4.0 - 10.5 K/uL 10.8(H)  Hemoglobin 12.0 - 15.0 g/dL 9.5(L)  Hematocrit 36.0 - 46.0 % 29.6(L)  Platelets 150 - 400 K/uL 235    @IMAGES @  Ct Head Wo Contrast  Result Date: 12/25/2018 CLINICAL DATA:  Weakness and fever with headache EXAM: CT HEAD WITHOUT CONTRAST TECHNIQUE: Contiguous axial images were obtained from the base of the skull through the vertex without intravenous contrast. COMPARISON:  Head CT 09/08/2015 FINDINGS: Brain: There is no mass, hemorrhage or extra-axial collection. The size and configuration of the ventricles and extra-axial CSF spaces are normal. The brain parenchyma is normal, without acute or chronic infarction. Vascular: No abnormal hyperdensity of the major intracranial arteries or dural venous sinuses. No intracranial atherosclerosis. Skull: The visualized skull base, calvarium and extracranial soft tissues are normal. Sinuses/Orbits: No fluid levels or advanced mucosal thickening of the visualized paranasal sinuses. No mastoid or middle ear effusion. The orbits are  normal. IMPRESSION: Normal head CT. Electronically Signed   By: Ulyses Jarred M.D.   On: 12/25/2018 21:24   Mr Shoulder Right Wo Contrast  Result Date: 12/18/2018 CLINICAL DATA:  Right lateral neck pain radiating into the right shoulder and arm for the  past 3 weeks. Limited shoulder range of motion. EXAM: MRI OF THE RIGHT SHOULDER WITHOUT CONTRAST TECHNIQUE: Multiplanar, multisequence MR imaging of the shoulder was performed. No intravenous contrast was administered. COMPARISON:  None. FINDINGS: Rotator cuff: Mild to moderate supraspinatus tendinosis with low-grade partial-thickness articular surface tear of the mid to distal tendon. The infraspinatus, teres minor, and subscapularis tendons are unremarkable. Muscles: No atrophy or abnormal signal of the muscles of the rotator cuff. Biceps long head: Intact and normally positioned. Mild intra-articular tendinosis. Acromioclavicular Joint: Mild arthropathy of the acromioclavicular joint. Type II acromion. Trace fluid in the subacromial/subdeltoid bursa. Glenohumeral Joint: No joint effusion. No chondral defect. Labrum: Grossly intact, but evaluation is limited by lack of intraarticular fluid. Bones:  No marrow abnormality, fracture or dislocation. Other: None. IMPRESSION: 1. Mild to moderate supraspinatus tendinosis with low-grade partial-thickness articular surface tear of the mid to distal tendon. 2. Mild intra-articular biceps tendinosis. 3. Mild acromioclavicular osteoarthritis. Electronically Signed   By: Titus Dubin M.D.   On: 12/18/2018 15:06   US Venous Img Lower Unilateral Left  Result Date: 12/26/2018 CLINICAL DATA:  53 year old female with left leg swelling. EXAM: Left LOWER EXTREMITY VENOUS DOPPLER ULTRASOUND TECHNIQUE: Gray-scale sonography with graded compression, as well as color Doppler and duplex ultrasound were performed to evaluate the lower extremity deep venous systems from the level of the common femoral vein and including the common  femoral, femoral, profunda femoral, popliteal and calf veins including the posterior tibial, peroneal and gastrocnemius veins when visible. The superficial great saphenous vein was also interrogated. Spectral Doppler was utilized to evaluate flow at rest and with distal augmentation maneuvers in the common femoral, femoral and popliteal veins. COMPARISON:  Ultrasound dated 05/23/2016 FINDINGS: Contralateral Common Femoral Vein: Respiratory phasicity is normal and symmetric with the symptomatic side. No evidence of thrombus. Normal compressibility. Common Femoral Vein: No evidence of thrombus. Normal compressibility, respiratory phasicity and response to augmentation. Saphenofemoral Junction: No evidence of thrombus. Normal compressibility and flow on color Doppler imaging. Profunda Femoral Vein: No evidence of thrombus. Normal compressibility and flow on color Doppler imaging. Femoral Vein: No evidence of thrombus. Normal compressibility, respiratory phasicity and response to augmentation. Popliteal Vein: No evidence of thrombus. Normal compressibility, respiratory phasicity and response to augmentation. Calf Veins: No evidence of thrombus. Normal compressibility and flow on color Doppler imaging. Superficial Great Saphenous Vein: No evidence of thrombus. Normal compressibility. Venous Reflux:  None. Other Findings:  None. IMPRESSION: No evidence of deep venous thrombosis. Electronically Signed   By: Anner Crete M.D.   On: 12/26/2018 00:46   US Venous Img Lower Unilateral Right  Result Date: 12/26/2018 CLINICAL DATA:  Right lower extremity pain and edema. Elevated D-dimer. EXAM: RIGHT LOWER EXTREMITY VENOUS DOPPLER ULTRASOUND TECHNIQUE: Gray-scale sonography with graded compression, as well as color Doppler and duplex ultrasound were performed to evaluate the lower extremity deep venous systems from the level of the common femoral vein and including the common femoral, femoral, profunda femoral, popliteal  and calf veins including the posterior tibial, peroneal and gastrocnemius veins when visible. The superficial great saphenous vein was also interrogated. Spectral Doppler was utilized to evaluate flow at rest and with distal augmentation maneuvers in the common femoral, femoral and popliteal veins. COMPARISON:  Left lower extremity study on 12/25/2018 FINDINGS: Contralateral Common Femoral Vein: Respiratory phasicity is normal and symmetric with the symptomatic side. No evidence of thrombus. Normal compressibility. Common Femoral Vein: No evidence of thrombus. Normal compressibility, respiratory phasicity and response to augmentation. Saphenofemoral Junction:  No evidence of thrombus. Normal compressibility and flow on color Doppler imaging. Profunda Femoral Vein: No evidence of thrombus. Normal compressibility and flow on color Doppler imaging. Femoral Vein: No evidence of thrombus. Normal compressibility, respiratory phasicity and response to augmentation. Popliteal Vein: No evidence of thrombus. Normal compressibility, respiratory phasicity and response to augmentation. Calf Veins: No evidence of thrombus. Normal compressibility and flow on color Doppler imaging. Superficial Great Saphenous Vein: No evidence of thrombus. Normal compressibility. Venous Reflux:  None. Other Findings: No evidence of superficial thrombophlebitis or abnormal fluid collection. IMPRESSION: No evidence of right lower extremity deep venous thrombosis. Electronically Signed   By: Aletta Edouard M.D.   On: 12/26/2018 12:14   Dg Chest Portable 1 View  Result Date: 12/25/2018 CLINICAL DATA:  Weakness.  Headache.  Fever. EXAM: PORTABLE CHEST 1 VIEW COMPARISON:  Chest x-ray 07/11/2016. FINDINGS: Mediastinum and hilar structures normal. Lungs are clear. No pleural effusion or pneumothorax. Heart size normal. No acute bony abnormality. IMPRESSION: No acute cardiopulmonary disease. Electronically Signed   By: H[place]   On: 12/25/2018  10:17    Assessment and plan-  #UTI/sepsis, blood cultures pending.  Urine culture shows E. coli.   Continue IV ceftriaxone. #Elevated d-dimer, left lower extremity swelling and warmth cellulitis versus thrombosis. Left lower extremity ultrasound venous duplex negative for thrombosis.   Seen by vascular surgeon service, felt to be chronic vein insufficiency. If this is cellulitis, she is on antibiotics which will help as well. Clinically no indication for PE study.  Continue monitor.  Thank you for allowing me to participate in the care of this patient.   Earlie Server, MD, PhD 12/27/2018

## 2018-12-27 NOTE — Progress Notes (Addendum)
[facility] [facility] [place] NAME: Yvone Slape    MR#:  [id number]  DATE OF BIRTH:  1965/11/09  SUBJECTIVE:   Chief Complaint  Patient presents with   Weakness   Patient is seen at the bedside. She is sitting up in bed eating breakfast. Overall she feels that her condition is improving. She is still with low grade fevers but better with Tylenol. Complaining of left lower leg pain and swelling since yesterday evening.  REVIEW OF SYSTEMS:  Review of Systems  Constitutional: Positive for fever. Negative for chills, malaise/fatigue and weight loss.  HENT: Negative for congestion, hearing loss and sore throat.   Eyes: Negative for blurred vision and double vision.  Respiratory: Negative for cough, shortness of breath and wheezing.   Cardiovascular: Positive for leg swelling. Negative for chest pain, palpitations and orthopnea.       Left leg decreased sensation and pain  Gastrointestinal: Negative for abdominal pain, diarrhea, nausea and vomiting.  Genitourinary: Negative for dysuria and urgency.  Musculoskeletal: Negative for myalgias.  Skin: Negative for rash.  Neurological: Negative for dizziness, sensory change, speech change, focal weakness and headaches.  Psychiatric/Behavioral: Negative for depression.   DRUG ALLERGIES:   Allergies  Allergen Reactions   Enalapril Swelling    Swelling face   VITALS:  Blood pressure 131/65, pulse 79, temperature 99.8 F (37.7 C), temperature source Oral, resp. rate 17, height 5\' 6"  (1.676 m), weight 127 kg, last menstrual period 05/19/2015, SpO2 98 %. PHYSICAL EXAMINATION:   GENERAL:  53 y.o.-year-old patient lying in the bed with no acute distress.  EYES: Pupils equal, round, reactive to light and accommodation. No scleral icterus. Extraocular muscles intact.  HEENT: Head atraumatic, normocephalic. Oropharynx and nasopharynx clear.  NECK:  Supple, no jugular venous distention. No thyroid  enlargement, no tenderness.  LUNGS: Normal breath sounds bilaterally, no wheezing, rales,rhonchi or crepitation. No use of accessory muscles of respiration.  CARDIOVASCULAR: S1, S2 normal. No murmurs, rubs, or gallops.  ABDOMEN: Soft, nontender, nondistended. Bowel sounds present. No organomegaly or mass.  EXTREMITIES:Left leg erythema, warmth with tightness cyanosis, or clubbing. No rash or lesions. + pedal pulses MUSCULOSKELETAL: Normal bulk, and power was 5+ grip and elbow, knee, and ankle flexion and extension bilaterally.  NEUROLOGIC:Alert and oriented x 3. CN 2-12 intact. Sensation to light touch and cold stimuli intact bilaterally. Finger to nose nl. Babinski is downgoing. DTR's (biceps, patellar, and achilles) 2+ and symmetric throughout. Gait not tested due to safety concern. PSYCHIATRIC: The patient is alert and oriented x 3.  SKIN: No obvious rash, lesion, or ulcer.     DATA REVIEWED:  LABORATORY PANEL:  Female CBC Recent Labs  Lab 12/27/18 0601  WBC 10.8*  HGB 9.5*  HCT 29.6*  PLT 235   ------------------------------------------------------------------------------------------------------------------ Chemistries  Recent Labs  Lab 12/25/18 0816  12/27/18 0601  NA  --    < > 140  K  --    < > 3.4*  CL  --    < > 107  CO2  --    < > 25  GLUCOSE  --    < > 91  BUN  --    < > 11  CREATININE  --    < > 0.82  CALCIUM  --    < > 7.6*  AST 29  --   --   ALT 18  --   --   ALKPHOS 67  --   --  BILITOT 1.3*  --   --    < > = values in this interval not displayed.   RADIOLOGY:  Ct Head Wo Contrast  Result Date: 12/25/2018 CLINICAL DATA:  Weakness and fever with headache EXAM: CT HEAD WITHOUT CONTRAST TECHNIQUE: Contiguous axial images were obtained from the base of the skull through the vertex without intravenous contrast. COMPARISON:  Head CT 09/08/2015 FINDINGS: Brain: There is no mass, hemorrhage or extra-axial collection. The size and configuration of the ventricles  and extra-axial CSF spaces are normal. The brain parenchyma is normal, without acute or chronic infarction. Vascular: No abnormal hyperdensity of the major intracranial arteries or dural venous sinuses. No intracranial atherosclerosis. Skull: The visualized skull base, calvarium and extracranial soft tissues are normal. Sinuses/Orbits: No fluid levels or advanced mucosal thickening of the visualized paranasal sinuses. No mastoid or middle ear effusion. The orbits are normal. IMPRESSION: Normal head CT. Electronically Signed   By: Ulyses Jarred M.D.   On: 12/25/2018 21:24   US Venous Img Lower Unilateral Left  Result Date: 12/26/2018 CLINICAL DATA:  53 year old female with left leg swelling. EXAM: Left LOWER EXTREMITY VENOUS DOPPLER ULTRASOUND TECHNIQUE: Gray-scale sonography with graded compression, as well as color Doppler and duplex ultrasound were performed to evaluate the lower extremity deep venous systems from the level of the common femoral vein and including the common femoral, femoral, profunda femoral, popliteal and calf veins including the posterior tibial, peroneal and gastrocnemius veins when visible. The superficial great saphenous vein was also interrogated. Spectral Doppler was utilized to evaluate flow at rest and with distal augmentation maneuvers in the common femoral, femoral and popliteal veins. COMPARISON:  Ultrasound dated 05/23/2016 FINDINGS: Contralateral Common Femoral Vein: Respiratory phasicity is normal and symmetric with the symptomatic side. No evidence of thrombus. Normal compressibility. Common Femoral Vein: No evidence of thrombus. Normal compressibility, respiratory phasicity and response to augmentation. Saphenofemoral Junction: No evidence of thrombus. Normal compressibility and flow on color Doppler imaging. Profunda Femoral Vein: No evidence of thrombus. Normal compressibility and flow on color Doppler imaging. Femoral Vein: No evidence of thrombus. Normal compressibility,  respiratory phasicity and response to augmentation. Popliteal Vein: No evidence of thrombus. Normal compressibility, respiratory phasicity and response to augmentation. Calf Veins: No evidence of thrombus. Normal compressibility and flow on color Doppler imaging. Superficial Great Saphenous Vein: No evidence of thrombus. Normal compressibility. Venous Reflux:  None. Other Findings:  None. IMPRESSION: No evidence of deep venous thrombosis. Electronically Signed   By: Anner Crete M.D.   On: 12/26/2018 00:46   US Venous Img Lower Unilateral Right  Result Date: 12/26/2018 CLINICAL DATA:  Right lower extremity pain and edema. Elevated D-dimer. EXAM: RIGHT LOWER EXTREMITY VENOUS DOPPLER ULTRASOUND TECHNIQUE: Gray-scale sonography with graded compression, as well as color Doppler and duplex ultrasound were performed to evaluate the lower extremity deep venous systems from the level of the common femoral vein and including the common femoral, femoral, profunda femoral, popliteal and calf veins including the posterior tibial, peroneal and gastrocnemius veins when visible. The superficial great saphenous vein was also interrogated. Spectral Doppler was utilized to evaluate flow at rest and with distal augmentation maneuvers in the common femoral, femoral and popliteal veins. COMPARISON:  Left lower extremity study on 12/25/2018 FINDINGS: Contralateral Common Femoral Vein: Respiratory phasicity is normal and symmetric with the symptomatic side. No evidence of thrombus. Normal compressibility. Common Femoral Vein: No evidence of thrombus. Normal compressibility, respiratory phasicity and response to augmentation. Saphenofemoral Junction: No evidence of thrombus. Normal compressibility  and flow on color Doppler imaging. Profunda Femoral Vein: No evidence of thrombus. Normal compressibility and flow on color Doppler imaging. Femoral Vein: No evidence of thrombus. Normal compressibility, respiratory phasicity and response  to augmentation. Popliteal Vein: No evidence of thrombus. Normal compressibility, respiratory phasicity and response to augmentation. Calf Veins: No evidence of thrombus. Normal compressibility and flow on color Doppler imaging. Superficial Great Saphenous Vein: No evidence of thrombus. Normal compressibility. Venous Reflux:  None. Other Findings: No evidence of superficial thrombophlebitis or abnormal fluid collection. IMPRESSION: No evidence of right lower extremity deep venous thrombosis. Electronically Signed   By: Aletta Edouard M.D.   On: 12/26/2018 12:14   ASSESSMENT AND PLAN:   53 y.o. female with pertinent past medical history of CAD, hypertension, hyperlipidemia, and MI presenting to the ED with chief complaints of generalized fatigue, fevers and chills since 12/26/2018  1. Sepsis - Patient presenting with fever 100.6, tachycardia, hypotension, elevated lactic acid, leukocytosis and elevated procalcitonin. - Likely secondary to UTI and left leg cellulitis - Blood cultures shows no growth so far - IVFs   2. UTI - UA shows UTI - Urine culture shows E.Coli - Continue IV ceftriaxone  3. Hypokalemia - improved with IV replacement - Recheck check in a.m.  4.  Elevated D-dimer - No evidence of DIC, hemolysis or signs of PE - Likely due to sepsis versus thrombophlebitis of left leg? - Fibrinogen elevated  - Haptoglobin, LDH, PT/INR normal - Oncology input appreciated  5. Left lower extremities swelling and erythema - Likely cellulitis - [place] negative for DVT - Ceftriaxone as above -* Will start Vancomycin for broad coverage - Check CT venogram for possible iliac thrombosis or abcess - Vascular consult - discussed with Dr. Lucky Cowboy who will see patient  6.  AKI - Improving - Prerenal - Avoid nephrotoxins - IVFs - Continue to monitor renal function  7. Coronary Artery Disease -in negative artery, right CEA, LAD, and LCx, history of MI with DES to RCA - ASA 81mg  PO daily -  Clopidogrel 75mg  PO daily  - HTN, HLD, DM control as below  8. HLD  + Goal LDL<100 - Atorvastatin 40mg  PO qhs  9. HTN = now hypotensive + Goal BP <140/90 -Hold BP meds in the setting of sepsis -We will resume once BP stable  10. DVT prophylaxis - Heparin SubQ    All the records are reviewed and case discussed with Care Management/Social Worker. Management plans discussed with the patient, family and they are in agreement.  CODE STATUS: Full Code  TOTAL TIME TAKING CARE OF THIS PATIENT: 38 minutes.   More than 50% of the time was spent in counseling/coordination of care: YES  POSSIBLE D/C IN 1 DAYS, DEPENDING ON CLINICAL CONDITION.   on 12/27/2018 at 11:26 AM  Rufina Falco, DNP, FNP-BC [facility] Nurse Practitioner Between 7am to 6pm - Pager - [phone]  After 6pm go to [email or web address] - password [email or web address] [facility]  Office  [phone]  CC: Primary care physician; Perrin Maltese, MD  Note: This dictation was prepared with Dragon dictation along with smaller phrase technology. Any transcriptional errors that result from this process are unintentional.

## 2018-12-27 NOTE — Consult Note (Signed)
Pharmacy Antibiotic Note  Sarah Medina is a 53 y.o. female admitted on 12/25/2018 with cellulitis.  Patient's cellulitis is worsening and severe. No purulence noted in picture. Pharmacy has been consulted for Vancomycin dosing.  Prior to consult, patient was in AKI but it is now improving. Presently, she is not on any antibiotics that would cause nephrotoxicity while being on Vancomycin.    BMI 45   Plan: Will order a loading dose of 2500 mg x 1 dose, then a maintenance dose of 2000 mg  Q24H will start on 12/28/18 @ 1800. (doses > 3g/day could increase risk of nephrotoxicity).  AUC goal: 400-450 AUC expected: 482  Height: 5\' 6"  (167.6 cm) Weight: 280 lb (127 kg) IBW/kg (Calculated) : 59.3  Temp (24hrs), Avg:99.3 F (37.4 C), Min:98.8 F (37.1 C), Max:99.8 F (37.7 C)  Recent Labs  Lab 12/25/18 0916 12/25/18 0931 12/25/18 1811 12/25/18 2259 12/26/18 0450 12/26/18 0526 12/27/18 0601  WBC 16.7*  --   --   --   --  14.5* 10.8*  CREATININE 1.71*  --   --   --   --  1.08* 0.82  LATICACIDVEN  --  1.2 2.2* 2.3* 1.3  --   --     Estimated Creatinine Clearance: 107 mL/min (by C-G formula based on SCr of 0.82 mg/dL).    Allergies  Allergen Reactions  . Enalapril Swelling    Swelling face    Antimicrobials this admission: 6/24 CTX >> 6/26 Vanc >>   Dose adjustments this admission: N/A  Microbiology results: 6/24 BCx: NGTD 6/24 UCx: resistant to ampicillin and unasyn   Thank you for allowing pharmacy to be a part of this patient's care.  Rowland Lathe 12/27/2018 5:00 PM

## 2018-12-28 LAB — CBC WITH DIFFERENTIAL/PLATELET
Abs Immature Granulocytes: 0.08 10*3/uL — ABNORMAL HIGH (ref 0.00–0.07)
Basophils Absolute: 0 10*3/uL (ref 0.0–0.1)
Basophils Relative: 0 %
Eosinophils Absolute: 0.2 10*3/uL (ref 0.0–0.5)
Eosinophils Relative: 2 %
HCT: 29 % — ABNORMAL LOW (ref 36.0–46.0)
Hemoglobin: 9.1 g/dL — ABNORMAL LOW (ref 12.0–15.0)
Immature Granulocytes: 1 %
Lymphocytes Relative: 19 %
Lymphs Abs: 1.7 10*3/uL (ref 0.7–4.0)
MCH: 26.9 pg (ref 26.0–34.0)
MCHC: 31.4 g/dL (ref 30.0–36.0)
MCV: 85.8 fL (ref 80.0–100.0)
Monocytes Absolute: 0.8 10*3/uL (ref 0.1–1.0)
Monocytes Relative: 9 %
Neutro Abs: 6.2 10*3/uL (ref 1.7–7.7)
Neutrophils Relative %: 69 %
Platelets: 260 10*3/uL (ref 150–400)
RBC: 3.38 MIL/uL — ABNORMAL LOW (ref 3.87–5.11)
RDW: 16.2 % — ABNORMAL HIGH (ref 11.5–15.5)
WBC: 9.1 10*3/uL (ref 4.0–10.5)
nRBC: 0 % (ref 0.0–0.2)

## 2018-12-28 LAB — BASIC METABOLIC PANEL
Anion gap: 6 (ref 5–15)
BUN: 9 mg/dL (ref 6–20)
CO2: 26 mmol/L (ref 22–32)
Calcium: 7.5 mg/dL — ABNORMAL LOW (ref 8.9–10.3)
Chloride: 107 mmol/L (ref 98–111)
Creatinine, Ser: 0.72 mg/dL (ref 0.44–1.00)
GFR calc Af Amer: >60 mL/min (ref >60)
GFR calc non Af Amer: >60 mL/min (ref >60)
Glucose, Bld: 96 mg/dL (ref 70–99)
Potassium: 3.4 mmol/L — ABNORMAL LOW (ref 3.5–5.1)
Sodium: 139 mmol/L (ref 135–145)

## 2018-12-28 MED ORDER — ENOXAPARIN SODIUM 40 MG/0.4ML ~~LOC~~ SOLN
40.0000 mg | SUBCUTANEOUS | Status: DC
  Administered 2018-12-28: 40 mg via SUBCUTANEOUS
  Filled 2018-12-28: qty 0.4

## 2018-12-28 MED ORDER — SODIUM CHLORIDE 0.9 % IV SOLN
INTRAVENOUS | Status: DC | PRN
  Administered 2018-12-28: 250 mL via INTRAVENOUS

## 2018-12-28 NOTE — Progress Notes (Signed)
[place] [facility] [place] NAME: Sarah Medina    MR#:  [id number]  DATE OF BIRTH:  05/24/1966  SUBJECTIVE:   Patient overall feels much better since admission.  Still complaining of some left leg swelling and pain.  REVIEW OF SYSTEMS:    Review of Systems  Constitutional: Negative for chills and fever.  HENT: Negative for congestion and tinnitus.   Eyes: Negative for blurred vision and double vision.  Respiratory: Negative for cough, shortness of breath and wheezing.   Cardiovascular: Negative for chest pain, orthopnea and PND.  Gastrointestinal: Negative for abdominal pain, diarrhea, nausea and vomiting.  Genitourinary: Negative for dysuria and hematuria.  Musculoskeletal: Positive for joint pain (left leg pain. ).  Neurological: Negative for dizziness, sensory change and focal weakness.  All other systems reviewed and are negative.   Nutrition: Heart Healthy/Carb control Tolerating Diet: yes Tolerating PT: Eval noted.   DRUG ALLERGIES:   Allergies  Allergen Reactions   Enalapril Swelling    Swelling face    VITALS:  Blood pressure 135/66, pulse 78, temperature 98.7 F (37.1 C), resp. rate 18, height 5\' 6"  (1.676 m), weight 127 kg, last menstrual period 05/19/2015, SpO2 97 %.  PHYSICAL EXAMINATION:   Physical Exam  GENERAL:  53 y.o.-year-old patient lying in bed in no acute distress.  EYES: Pupils equal, round, reactive to light and accommodation. No scleral icterus. Extraocular muscles intact.  HEENT: Head atraumatic, normocephalic. Oropharynx and nasopharynx clear.  NECK:  Supple, no jugular venous distention. No thyroid enlargement, no tenderness.  LUNGS: Normal breath sounds bilaterally, no wheezing, rales, rhonchi. No use of accessory muscles of respiration.  CARDIOVASCULAR: S1, S2 normal. No murmurs, rubs, or gallops.  ABDOMEN: Soft, nontender, nondistended. Bowel sounds present. No organomegaly or mass.    EXTREMITIES: No cyanosis, clubbing, +1 edema on LLE > right.  + LLE edema swelling, warmth   NEUROLOGIC: Cranial nerves II through XII are intact. No focal Motor or sensory deficits b/l.   PSYCHIATRIC: The patient is alert and oriented x 3.  SKIN: No obvious rash, lesion, or ulcer.    LABORATORY PANEL:   CBC Recent Labs  Lab 12/28/18 0346  WBC 9.1  HGB 9.1*  HCT 29.0*  PLT 260   ------------------------------------------------------------------------------------------------------------------  Chemistries  Recent Labs  Lab 12/25/18 0816  12/28/18 0346  NA  --    < > 139  K  --    < > 3.4*  CL  --    < > 107  CO2  --    < > 26  GLUCOSE  --    < > 96  BUN  --    < > 9  CREATININE  --    < > 0.72  CALCIUM  --    < > 7.5*  AST 29  --   --   ALT 18  --   --   ALKPHOS 67  --   --   BILITOT 1.3*  --   --    < > = values in this interval not displayed.   ------------------------------------------------------------------------------------------------------------------  Cardiac Enzymes No results for input(s): TROPONINI in the last 168 hours. ------------------------------------------------------------------------------------------------------------------  RADIOLOGY:  Ct Tibia Fibula Left W Contrast  Result Date: 12/27/2018 CLINICAL DATA:  Left lower extremity pain and swelling. EXAM: CT OF THE LOWER RIGHT EXTREMITY WITH CONTRAST TECHNIQUE: Multidetector CT imaging of the lower right extremity was performed according to the standard protocol following intravenous contrast  administration. COMPARISON:  None. CONTRAST:  151mL OMNIPAQUE IOHEXOL 350 MG/ML SOLN FINDINGS: Subcutaneous soft tissue swelling/edema/fluid mainly below the knee suggesting cellulitis. No discrete drainable rim enhancing soft tissue abscess is identified. The hip joint is maintained. Moderate degenerative changes are noted at the knee, mainly involving the medial compartment with joint space narrowing,  osteophytic spurring and mild chondrocalcinosis. Small knee joint effusion. The ankle joint is maintained. No findings suspicious for septic arthritis. No findings for osteomyelitis involving the femur, tibia or fibula. No CT findings suspicious for myofasciitis or pyomyositis. The major venous structures are patent. No findings for deep venous thrombosis. The major arterial structures are also normal. No significant atherosclerotic disease. IMPRESSION: 1. CT findings suggest cellulitis involving the left lower extremity mainly below the knee. No discrete soft tissue abscess. 2. No findings for myofasciitis or pyomyositis. 3. No CT findings to suggest septic arthritis or osteomyelitis. 4. Unremarkable appearance of the major arterial vascular structures in the left lower extremity. No findings for deep venous thrombosis. Electronically Signed   By: Marijo Sanes M.D.   On: 12/27/2018 17:02   Ct Femur Left W Contrast  Result Date: 12/27/2018 CLINICAL DATA:  Left lower extremity pain and swelling. EXAM: CT OF THE LOWER RIGHT EXTREMITY WITH CONTRAST TECHNIQUE: Multidetector CT imaging of the lower right extremity was performed according to the standard protocol following intravenous contrast administration. COMPARISON:  None. CONTRAST:  17mL OMNIPAQUE IOHEXOL 350 MG/ML SOLN FINDINGS: Subcutaneous soft tissue swelling/edema/fluid mainly below the knee suggesting cellulitis. No discrete drainable rim enhancing soft tissue abscess is identified. The hip joint is maintained. Moderate degenerative changes are noted at the knee, mainly involving the medial compartment with joint space narrowing, osteophytic spurring and mild chondrocalcinosis. Small knee joint effusion. The ankle joint is maintained. No findings suspicious for septic arthritis. No findings for osteomyelitis involving the femur, tibia or fibula. No CT findings suspicious for myofasciitis or pyomyositis. The major venous structures are patent. No findings  for deep venous thrombosis. The major arterial structures are also normal. No significant atherosclerotic disease. IMPRESSION: 1. CT findings suggest cellulitis involving the left lower extremity mainly below the knee. No discrete soft tissue abscess. 2. No findings for myofasciitis or pyomyositis. 3. No CT findings to suggest septic arthritis or osteomyelitis. 4. Unremarkable appearance of the major arterial vascular structures in the left lower extremity. No findings for deep venous thrombosis. Electronically Signed   By: Marijo Sanes M.D.   On: 12/27/2018 17:02   Ct Venogram Abd/pel  Result Date: 12/27/2018 CLINICAL DATA:  Left lower extremity pain and swelling. EXAM: CT OF THE LOWER RIGHT EXTREMITY WITH CONTRAST TECHNIQUE: Multidetector CT imaging of the lower right extremity was performed according to the standard protocol following intravenous contrast administration. COMPARISON:  None. CONTRAST:  161mL OMNIPAQUE IOHEXOL 350 MG/ML SOLN FINDINGS: Subcutaneous soft tissue swelling/edema/fluid mainly below the knee suggesting cellulitis. No discrete drainable rim enhancing soft tissue abscess is identified. The hip joint is maintained. Moderate degenerative changes are noted at the knee, mainly involving the medial compartment with joint space narrowing, osteophytic spurring and mild chondrocalcinosis. Small knee joint effusion. The ankle joint is maintained. No findings suspicious for septic arthritis. No findings for osteomyelitis involving the femur, tibia or fibula. No CT findings suspicious for myofasciitis or pyomyositis. The major venous structures are patent. No findings for deep venous thrombosis. The major arterial structures are also normal. No significant atherosclerotic disease. IMPRESSION: 1. CT findings suggest cellulitis involving the left lower extremity mainly below the  knee. No discrete soft tissue abscess. 2. No findings for myofasciitis or pyomyositis. 3. No CT findings to suggest septic  arthritis or osteomyelitis. 4. Unremarkable appearance of the major arterial vascular structures in the left lower extremity. No findings for deep venous thrombosis. Electronically Signed   By: Marijo Sanes M.D.   On: 12/27/2018 17:02     ASSESSMENT AND PLAN:   53 y.o. female with pertinent past medical history ofCAD, hypertension, hyperlipidemia, and MI presenting to the ED with chief complaints of generalized fatigue, fevers and chills since 12/26/2018  1. Sepsis -Patient presenting with fever 100.6, tachycardia, hypotension, elevated lactic acid, leukocytosis and elevated procalcitonin. -Continue IV ceftriaxone, vancomycin.  Blood cultures are negative.   - Afebrile and hemodynamically stable now.  2. UTI - UA shows UTI - Urine culture shows E.Coli - Continue IVceftriaxone and switch to Oral Ceftin/keflex upon discharge.   3.Hypokalemia -  Improving with supplementation and will cont. To monitor.   4.Elevated D-dimer - No evidence of DIC, hemolysis or signs of PE - Likely due to sepsis/Cellulitis - Fibrinogen elevated  - Haptoglobin, LDH, PT/INR normal - appreciate Oncology input and cont. Current care they have signed off.   5. Left lower extremities swelling and erythema - due to cellulitis - [place] negative for DVT - Vanc was added yesterday.  -CT venogram showing no evidence of thrombosis or abscess.  Seen by both vascular surgery and general surgery no acute indication for surgery at this time.  Vascular surgery will continue follow-up as outpatient.  6.AKI - Improving -Prerenal azotemia and improved and resolved with IV fluid hydration.  Creatinine is at baseline.  7.Coronary Artery Disease-in negative artery, right CEA, LAD, and LCx, history of MI withDES to RCA - ASA 81mg  PO daily - Clopidogrel 75mg  PO daily  - HTN, HLD, DM control as below  8.HLD  + Goal LDL<100 - Atorvastatin40mg  PO qhs  9.HTN - BP on low side  - cont. To follow BP.  Hold  anti-HTN for now.   PT eval noted today and likely discharge home tomorrow.  All the records are reviewed and case discussed with Care Management/Social Worker. Management plans discussed with the patient, family and they are in agreement.  CODE STATUS: Full code  DVT Prophylaxis: Lovenox  TOTAL TIME TAKING CARE OF THIS PATIENT: 30 minutes.   POSSIBLE D/C IN 1-2 DAYS, DEPENDING ON CLINICAL CONDITION.   Henreitta Leber M.D on 12/28/2018 at 1:22 PM  Between 7am to 6pm - Pager - [phone]  After 6pm go to [email or web address] - Proofreader  [facility]  Office  [phone]  CC: Primary care physician; Perrin Maltese, MD

## 2018-12-28 NOTE — Progress Notes (Signed)
Hematology/Oncology Progress Note [facility] ([place]) [phone] [phone]  Patient Care Team: Perrin Maltese, MD as PCP - General (Internal Medicine) Rico Junker, RN as Registered Nurse Theodore Demark, RN as Registered Nurse   Name of the patient: Sarah Medina  [id number]  1966-07-02  Date of visit: 12/28/18   INTERVAL HISTORY-  Sitting in the chair,  Afebrile.  Denies any breathing difficulties. Left lower extremity erythema/warmth  still tender.  Review of systems- Review of Systems  Constitutional: Negative for chills and fever.  Respiratory: Negative for chest tightness, cough and shortness of breath.   Cardiovascular: Positive for leg swelling. Negative for chest pain.  Gastrointestinal: Negative for abdominal distention and abdominal pain.  Genitourinary: Negative for difficulty urinating and dysuria.   Neurological: Negative for dizziness.  Hematological: Negative for adenopathy.  Psychiatric/Behavioral: The patient is not nervous/anxious.     Allergies  Allergen Reactions   Enalapril Swelling    Swelling face    Patient Active Problem List   Diagnosis Date Noted   Cellulitis of left lower extremity    Urinary tract infection without hematuria    Leg swelling    Positive D dimer    Sepsis ([place]) 12/25/2018   Chest pain, rule out acute myocardial infarction 07/11/2016   Ischemic heart disease 10/20/2015   Essential hypertension 10/20/2015   Hyperlipidemia 10/20/2015   Acute posthemorrhagic anemia    H/O myocardial infarction less than 8 weeks- STEMI inf wall 07/20/15- stent to RCA 07/23/2015   CAD in native artery, RCA, LAD and LCX 07/23/2015   Ventricular fibrillation ([place]) 07/23/2015   ST elevation myocardial infarction (STEMI) of inferior wall (HCC) 07/20/2015   Abnormal LFTs 12/23/2014   Osteoarthritis of right knee 11/24/2014     Past Medical History:  Diagnosis Date   Arthritis    hips, right leg   CAD in native artery, RCA, LAD and LCX 07/23/2015   Carotid artery occlusion    H/O myocardial infarction less than 8 weeks- STEMI inf wall 07/20/15- stent to RCA 07/20/15   inf wall STEMI with DES to RCA   Hypercholesteremia    Hypertension    Periorbital edema    treated with steroids   Seizures ([place])    as teenager, ages [age].  none since   STEMI (ST elevation myocardial infarction) ([place]) 07/23/15     Past Surgical History:  Procedure Laterality Date   CARDIAC CATHETERIZATION N/A 07/20/2015   Procedure: Left Heart Cath and Coronary Angiography;  Surgeon: Isaias Cowman, MD;  Location: [place] [facility];  Service: Cardiovascular;  Laterality: N/A;   CARDIAC CATHETERIZATION N/A 07/20/2015   Procedure: Coronary Stent Intervention;  Surgeon: Isaias Cowman, MD;  Location: [place] [facility];  Service: Cardiovascular;  Laterality: N/A;   CARDIAC CATHETERIZATION N/A 07/23/2015   Procedure: Left Heart Cath and Coronary Angiography;  Surgeon: Lorretta Harp, MD;  Location: [place] [facility];  Service: Cardiovascular;  Laterality: N/A;   CARDIAC CATHETERIZATION N/A 07/23/2015   Procedure: Coronary Balloon Angioplasty;  Surgeon: Lorretta Harp, MD;  Location: [place] [facility];  Service: Cardiovascular;  Laterality: N/A;   CARDIAC CATHETERIZATION N/A 07/12/2016   Procedure: Left Heart Cath;  Surgeon: Teodoro Spray, MD;  Location: [place] [facility];  Service: Cardiovascular;  Laterality: N/A;   CARDIAC CATHETERIZATION N/A 07/12/2016   Procedure: Left Heart Cath and Coronary Angiography;  Surgeon: Isaias Cowman, MD;  Location: [place] [facility];  Service: Cardiovascular;  Laterality:  N/A;   CARDIAC CATHETERIZATION N/A 07/12/2016   Procedure: Intravascular Pressure Wire/FFR Study;  Surgeon: Isaias Cowman, MD;  Location: [place] [facility];  Service: Cardiovascular;  Laterality: N/A;   CARDIAC CATHETERIZATION N/A 07/12/2016    Procedure: Coronary Stent Intervention;  Surgeon: Isaias Cowman, MD;  Location: [place] [facility];  Service: Cardiovascular;  Laterality: N/A;   KNEE SURGERY Right 15 years ago    Social History   Socioeconomic History   Marital status: Married    Spouse name: Not on file   Number of children: Not on file   Years of education: Not on file   Highest education level: Not on file  Occupational History   Not on file  Social Needs   Financial resource strain: Not on file   Food insecurity    Worry: Not on file    Inability: Not on file   Transportation needs    Medical: Not on file    Non-medical: Not on file  Tobacco Use   Smoking status: Never Smoker   Smokeless tobacco: Former Systems developer    Types: Snuff  Substance and Sexual Activity   Alcohol use: No   Drug use: Never   Sexual activity: Not on file  Lifestyle   Physical activity    Days per week: Not on file    Minutes per session: Not on file   Stress: Not on file  Relationships   Social connections    Talks on phone: Not on file    Gets together: Not on file    Attends religious service: Not on file    Active member of club or organization: Not on file    Attends meetings of clubs or organizations: Not on file    Relationship status: Not on file   Intimate partner violence    Fear of current or ex partner: Not on file    Emotionally abused: Not on file    Physically abused: Not on file    Forced sexual activity: Not on file  Other Topics Concern   Not on file  Social History Narrative   Not on file     Family History  Problem Relation Age of Onset   Breast cancer Sister 11     Current Facility-Administered Medications:    acetaminophen (TYLENOL) tablet 650 mg, 650 mg, Oral, Q6H PRN, Harrie Foreman, MD, 650 mg at 12/26/18 0438   aspirin EC tablet 81 mg, 81 mg, Oral, Daily, Ouma, Bing Neighbors, NP, 81 mg at 12/28/18 0854   atorvastatin (LIPITOR) tablet 40 mg, 40 mg,  Oral, q1800, Lang Snow, NP, 40 mg at 12/27/18 1747   cefTRIAXone (ROCEPHIN) 1 g in sodium chloride 0.9 % 100 mL IVPB, 1 g, Intravenous, Q24H, Paduchowski, Lennette Bihari, MD, Last Rate: 200 mL/hr at 12/28/18 0901, 1 g at 12/28/18 0901   clopidogrel (PLAVIX) tablet 75 mg, 75 mg, Oral, Daily, Ouma, Bing Neighbors, NP, 75 mg at 12/28/18 0854   fluticasone (FLONASE) 50 MCG/ACT nasal spray 1 spray, 1 spray, Each Nare, Daily, Ouma, Bing Neighbors, NP, 1 spray at 12/28/18 0854   heparin injection 5,000 Units, 5,000 Units, Subcutaneous, Q8H, Lance Coon, MD, 5,000 Units at 12/28/18 0602   loratadine (CLARITIN) tablet 10 mg, 10 mg, Oral, Daily, Ouma, Bing Neighbors, NP   medroxyPROGESTERone (PROVERA) tablet 10 mg, 10 mg, Oral, Daily, Ouma, Bing Neighbors, NP   nitroGLYCERIN (NITROSTAT) SL tablet 0.4 mg, 0.4 mg, Sublingual, Q5 Min x 3 PRN, Lang Snow, NP  pantoprazole (PROTONIX) EC tablet 40 mg, 40 mg, Oral, Daily, Ouma, Bing Neighbors, NP, 40 mg at 12/28/18 0853   vancomycin (VANCOCIN) 2,000 mg in sodium chloride 0.9 % 500 mL IVPB, 2,000 mg, Intravenous, Q24H, Duncan, Asajah R, RPH   Vitamin D (Ergocalciferol) (DRISDOL) capsule 50,000 Units, 50,000 Units, Oral, Q7 days, Lang Snow, NP   Physical exam:  Vitals:   12/27/18 1435 12/27/18 1436 12/27/18 1934 12/28/18 0421  BP:  102/60 118/67 135/66  Pulse:  93 81 78  Resp:  18  18  Temp: 99.2 F (37.3 C)  98.8 F (37.1 C) 98.7 F (37.1 C)  TempSrc:      SpO2:  98% 97% 97%  Weight:      Height:       Physical Exam  Constitutional: She is oriented to person, place, and time. No distress.  HENT:  Head: Normocephalic and atraumatic.  Mouth/Throat: No oropharyngeal exudate.  Eyes: Pupils are equal, round, and reactive to light. EOM are normal. No scleral icterus.  Neck: Normal range of motion. Neck supple.  Cardiovascular: Normal rate and regular rhythm.  No murmur heard. Pulmonary/Chest:  Effort normal. No respiratory distress. She has no rales. She exhibits no tenderness.  Abdominal: Soft. She exhibits no distension. There is no abdominal tenderness.  Musculoskeletal: Normal range of motion.        General: No edema.     Comments: Left lower extremity warmth and swelling  Neurological: She is alert and oriented to person, place, and time.  Skin: Skin is warm and dry. She is not diaphoretic. No erythema.  Psychiatric: Affect normal.       CMP Latest Ref Rng & Units 12/28/2018  Glucose 70 - 99 mg/dL 96  BUN 6 - 20 mg/dL 9  Creatinine 0.44 - 1.00 mg/dL 0.72  Sodium 135 - 145 mmol/L 139  Potassium 3.5 - 5.1 mmol/L 3.4(L)  Chloride 98 - 111 mmol/L 107  CO2 22 - 32 mmol/L 26  Calcium 8.9 - 10.3 mg/dL 7.5(L)  Total Protein 6.5 - 8.1 g/dL -  Total Bilirubin 0.3 - 1.2 mg/dL -  Alkaline Phos 38 - 126 U/L -  AST 15 - 41 U/L -  ALT 0 - 44 U/L -   CBC Latest Ref Rng & Units 12/28/2018  WBC 4.0 - 10.5 K/uL 9.1  Hemoglobin 12.0 - 15.0 g/dL 9.1(L)  Hematocrit 36.0 - 46.0 % 29.0(L)  Platelets 150 - 400 K/uL 260    RADIOGRAPHIC STUDIES: I have personally reviewed the radiological images as listed and agreed with the findings in the report.   Ct Head Wo Contrast  Result Date: 12/25/2018 CLINICAL DATA:  Weakness and fever with headache EXAM: CT HEAD WITHOUT CONTRAST TECHNIQUE: Contiguous axial images were obtained from the base of the skull through the vertex without intravenous contrast. COMPARISON:  Head CT 09/08/2015 FINDINGS: Brain: There is no mass, hemorrhage or extra-axial collection. The size and configuration of the ventricles and extra-axial CSF spaces are normal. The brain parenchyma is normal, without acute or chronic infarction. Vascular: No abnormal hyperdensity of the major intracranial arteries or dural venous sinuses. No intracranial atherosclerosis. Skull: The visualized skull base, calvarium and extracranial soft tissues are normal. Sinuses/Orbits: No fluid  levels or advanced mucosal thickening of the visualized paranasal sinuses. No mastoid or middle ear effusion. The orbits are normal. IMPRESSION: Normal head CT. Electronically Signed   By: Ulyses Jarred M.D.   On: 12/25/2018 21:24   Ct Tibia Fibula Left W  Contrast  Result Date: 12/27/2018 CLINICAL DATA:  Left lower extremity pain and swelling. EXAM: CT OF THE LOWER RIGHT EXTREMITY WITH CONTRAST TECHNIQUE: Multidetector CT imaging of the lower right extremity was performed according to the standard protocol following intravenous contrast administration. COMPARISON:  None. CONTRAST:  152mL OMNIPAQUE IOHEXOL 350 MG/ML SOLN FINDINGS: Subcutaneous soft tissue swelling/edema/fluid mainly below the knee suggesting cellulitis. No discrete drainable rim enhancing soft tissue abscess is identified. The hip joint is maintained. Moderate degenerative changes are noted at the knee, mainly involving the medial compartment with joint space narrowing, osteophytic spurring and mild chondrocalcinosis. Small knee joint effusion. The ankle joint is maintained. No findings suspicious for septic arthritis. No findings for osteomyelitis involving the femur, tibia or fibula. No CT findings suspicious for myofasciitis or pyomyositis. The major venous structures are patent. No findings for deep venous thrombosis. The major arterial structures are also normal. No significant atherosclerotic disease. IMPRESSION: 1. CT findings suggest cellulitis involving the left lower extremity mainly below the knee. No discrete soft tissue abscess. 2. No findings for myofasciitis or pyomyositis. 3. No CT findings to suggest septic arthritis or osteomyelitis. 4. Unremarkable appearance of the major arterial vascular structures in the left lower extremity. No findings for deep venous thrombosis. Electronically Signed   By: Marijo Sanes M.D.   On: 12/27/2018 17:02   Ct Femur Left W Contrast  Result Date: 12/27/2018 CLINICAL DATA:  Left lower extremity  pain and swelling. EXAM: CT OF THE LOWER RIGHT EXTREMITY WITH CONTRAST TECHNIQUE: Multidetector CT imaging of the lower right extremity was performed according to the standard protocol following intravenous contrast administration. COMPARISON:  None. CONTRAST:  145mL OMNIPAQUE IOHEXOL 350 MG/ML SOLN FINDINGS: Subcutaneous soft tissue swelling/edema/fluid mainly below the knee suggesting cellulitis. No discrete drainable rim enhancing soft tissue abscess is identified. The hip joint is maintained. Moderate degenerative changes are noted at the knee, mainly involving the medial compartment with joint space narrowing, osteophytic spurring and mild chondrocalcinosis. Small knee joint effusion. The ankle joint is maintained. No findings suspicious for septic arthritis. No findings for osteomyelitis involving the femur, tibia or fibula. No CT findings suspicious for myofasciitis or pyomyositis. The major venous structures are patent. No findings for deep venous thrombosis. The major arterial structures are also normal. No significant atherosclerotic disease. IMPRESSION: 1. CT findings suggest cellulitis involving the left lower extremity mainly below the knee. No discrete soft tissue abscess. 2. No findings for myofasciitis or pyomyositis. 3. No CT findings to suggest septic arthritis or osteomyelitis. 4. Unremarkable appearance of the major arterial vascular structures in the left lower extremity. No findings for deep venous thrombosis. Electronically Signed   By: Marijo Sanes M.D.   On: 12/27/2018 17:02   Mr Shoulder Right Wo Contrast  Result Date: 12/18/2018 CLINICAL DATA:  Right lateral neck pain radiating into the right shoulder and arm for the past 3 weeks. Limited shoulder range of motion. EXAM: MRI OF THE RIGHT SHOULDER WITHOUT CONTRAST TECHNIQUE: Multiplanar, multisequence MR imaging of the shoulder was performed. No intravenous contrast was administered. COMPARISON:  None. FINDINGS: Rotator cuff: Mild to  moderate supraspinatus tendinosis with low-grade partial-thickness articular surface tear of the mid to distal tendon. The infraspinatus, teres minor, and subscapularis tendons are unremarkable. Muscles: No atrophy or abnormal signal of the muscles of the rotator cuff. Biceps long head: Intact and normally positioned. Mild intra-articular tendinosis. Acromioclavicular Joint: Mild arthropathy of the acromioclavicular joint. Type II acromion. Trace fluid in the subacromial/subdeltoid bursa. Glenohumeral Joint: No joint  effusion. No chondral defect. Labrum: Grossly intact, but evaluation is limited by lack of intraarticular fluid. Bones:  No marrow abnormality, fracture or dislocation. Other: None. IMPRESSION: 1. Mild to moderate supraspinatus tendinosis with low-grade partial-thickness articular surface tear of the mid to distal tendon. 2. Mild intra-articular biceps tendinosis. 3. Mild acromioclavicular osteoarthritis. Electronically Signed   By: Titus Dubin M.D.   On: 12/18/2018 15:06   US Venous Img Lower Unilateral Left  Result Date: 12/26/2018 CLINICAL DATA:  53 year old female with left leg swelling. EXAM: Left LOWER EXTREMITY VENOUS DOPPLER ULTRASOUND TECHNIQUE: Gray-scale sonography with graded compression, as well as color Doppler and duplex ultrasound were performed to evaluate the lower extremity deep venous systems from the level of the common femoral vein and including the common femoral, femoral, profunda femoral, popliteal and calf veins including the posterior tibial, peroneal and gastrocnemius veins when visible. The superficial great saphenous vein was also interrogated. Spectral Doppler was utilized to evaluate flow at rest and with distal augmentation maneuvers in the common femoral, femoral and popliteal veins. COMPARISON:  Ultrasound dated 05/23/2016 FINDINGS: Contralateral Common Femoral Vein: Respiratory phasicity is normal and symmetric with the symptomatic side. No evidence of  thrombus. Normal compressibility. Common Femoral Vein: No evidence of thrombus. Normal compressibility, respiratory phasicity and response to augmentation. Saphenofemoral Junction: No evidence of thrombus. Normal compressibility and flow on color Doppler imaging. Profunda Femoral Vein: No evidence of thrombus. Normal compressibility and flow on color Doppler imaging. Femoral Vein: No evidence of thrombus. Normal compressibility, respiratory phasicity and response to augmentation. Popliteal Vein: No evidence of thrombus. Normal compressibility, respiratory phasicity and response to augmentation. Calf Veins: No evidence of thrombus. Normal compressibility and flow on color Doppler imaging. Superficial Great Saphenous Vein: No evidence of thrombus. Normal compressibility. Venous Reflux:  None. Other Findings:  None. IMPRESSION: No evidence of deep venous thrombosis. Electronically Signed   By: Anner Crete M.D.   On: 12/26/2018 00:46   US Venous Img Lower Unilateral Right  Result Date: 12/26/2018 CLINICAL DATA:  Right lower extremity pain and edema. Elevated D-dimer. EXAM: RIGHT LOWER EXTREMITY VENOUS DOPPLER ULTRASOUND TECHNIQUE: Gray-scale sonography with graded compression, as well as color Doppler and duplex ultrasound were performed to evaluate the lower extremity deep venous systems from the level of the common femoral vein and including the common femoral, femoral, profunda femoral, popliteal and calf veins including the posterior tibial, peroneal and gastrocnemius veins when visible. The superficial great saphenous vein was also interrogated. Spectral Doppler was utilized to evaluate flow at rest and with distal augmentation maneuvers in the common femoral, femoral and popliteal veins. COMPARISON:  Left lower extremity study on 12/25/2018 FINDINGS: Contralateral Common Femoral Vein: Respiratory phasicity is normal and symmetric with the symptomatic side. No evidence of thrombus. Normal compressibility.  Common Femoral Vein: No evidence of thrombus. Normal compressibility, respiratory phasicity and response to augmentation. Saphenofemoral Junction: No evidence of thrombus. Normal compressibility and flow on color Doppler imaging. Profunda Femoral Vein: No evidence of thrombus. Normal compressibility and flow on color Doppler imaging. Femoral Vein: No evidence of thrombus. Normal compressibility, respiratory phasicity and response to augmentation. Popliteal Vein: No evidence of thrombus. Normal compressibility, respiratory phasicity and response to augmentation. Calf Veins: No evidence of thrombus. Normal compressibility and flow on color Doppler imaging. Superficial Great Saphenous Vein: No evidence of thrombus. Normal compressibility. Venous Reflux:  None. Other Findings: No evidence of superficial thrombophlebitis or abnormal fluid collection. IMPRESSION: No evidence of right lower extremity deep venous thrombosis. Electronically Signed  By: Aletta Edouard M.D.   On: 12/26/2018 12:14   Dg Chest Portable 1 View  Result Date: 12/25/2018 CLINICAL DATA:  Weakness.  Headache.  Fever. EXAM: PORTABLE CHEST 1 VIEW COMPARISON:  Chest x-ray 07/11/2016. FINDINGS: Mediastinum and hilar structures normal. Lungs are clear. No pleural effusion or pneumothorax. Heart size normal. No acute bony abnormality. IMPRESSION: No acute cardiopulmonary disease. Electronically Signed   By: Marcello Moores  Register   On: 12/25/2018 10:17   Ct Venogram Abd/pel  Result Date: 12/27/2018 CLINICAL DATA:  Left lower extremity pain and swelling. EXAM: CT OF THE LOWER RIGHT EXTREMITY WITH CONTRAST TECHNIQUE: Multidetector CT imaging of the lower right extremity was performed according to the standard protocol following intravenous contrast administration. COMPARISON:  None. CONTRAST:  181mL OMNIPAQUE IOHEXOL 350 MG/ML SOLN FINDINGS: Subcutaneous soft tissue swelling/edema/fluid mainly below the knee suggesting cellulitis. No discrete drainable rim  enhancing soft tissue abscess is identified. The hip joint is maintained. Moderate degenerative changes are noted at the knee, mainly involving the medial compartment with joint space narrowing, osteophytic spurring and mild chondrocalcinosis. Small knee joint effusion. The ankle joint is maintained. No findings suspicious for septic arthritis. No findings for osteomyelitis involving the femur, tibia or fibula. No CT findings suspicious for myofasciitis or pyomyositis. The major venous structures are patent. No findings for deep venous thrombosis. The major arterial structures are also normal. No significant atherosclerotic disease. IMPRESSION: 1. CT findings suggest cellulitis involving the left lower extremity mainly below the knee. No discrete soft tissue abscess. 2. No findings for myofasciitis or pyomyositis. 3. No CT findings to suggest septic arthritis or osteomyelitis. 4. Unremarkable appearance of the major arterial vascular structures in the left lower extremity. No findings for deep venous thrombosis. Electronically Signed   By: Marijo Sanes M.D.   On: 12/27/2018 17:02    Assessment and plan-  #UTI/sepsis, blood cultures no growth for 3 days.  Urine culture shows E. coli.   Continue IV ceftriaxone. # Leg lower extremity swelling, US Venous duplex negative.  CT venogram abd/pelvis -Subcutaneous soft tissue swelling/edema/fluid mainly below the knee suggesting cellulitis. Unremarkable appearance of the major arterial vascular structures  # Elevated D Dimer, so evidence of thrombosis so far. No breathing difficulties, Clinically no indication for PE study.. Likely due to acute infection.  #Cellulitis, seen by surgery. No drainable fluid collection. Vancomycin was added. .   HemOnc will sign off. Please call if new concerns or symptoms.   Thank you for allowing me to participate in the care of this patient.   Earlie Server, MD, PhD 12/28/2018

## 2018-12-28 NOTE — Progress Notes (Signed)
12/28/2018  Subjective: No acute events.  Patient reports decreased pain and swelling of the leg, but still present.  Vital signs: Temp:  [98.7 F (37.1 C)-98.8 F (37.1 C)] 98.7 F (37.1 C) (06/27 1542) Pulse Rate:  [78-82] 82 (06/27 1542) Resp:  [18-20] 20 (06/27 1542) BP: (118-135)/(66-77) 127/77 (06/27 1542) SpO2:  [97 %-100 %] 100 % (06/27 1542)   Intake/Output: 06/26 0701 - 06/27 0700 In: 4548.7 [I.V.:3924.2; IV Piggyback:624.5] Out: 2400 [Urine:2400] Last BM Date: 12/26/18  Physical Exam: Constitutional: No acute distress Extremity:  1+ edema of the left lower leg, with somewhat improved erythema.  No fluctuance.  Improved tenderness with palpation.  Labs:  Recent Labs    12/27/18 0601 12/28/18 0346  WBC 10.8* 9.1  HGB 9.5* 9.1*  HCT 29.6* 29.0*  PLT 235 260   Recent Labs    12/27/18 0601 12/28/18 0346  NA 140 139  K 3.4* 3.4*  CL 107 107  CO2 25 26  GLUCOSE 91 96  BUN 11 9  CREATININE 0.82 0.72  CALCIUM 7.6* 7.5*   Recent Labs    12/26/18 0526  LABPROT 14.5  INR 1.1    Imaging: Ct Tibia Fibula Left W Contrast  Result Date: 12/27/2018 CLINICAL DATA:  Left lower extremity pain and swelling. EXAM: CT OF THE LOWER RIGHT EXTREMITY WITH CONTRAST TECHNIQUE: Multidetector CT imaging of the lower right extremity was performed according to the standard protocol following intravenous contrast administration. COMPARISON:  None. CONTRAST:  127mL OMNIPAQUE IOHEXOL 350 MG/ML SOLN FINDINGS: Subcutaneous soft tissue swelling/edema/fluid mainly below the knee suggesting cellulitis. No discrete drainable rim enhancing soft tissue abscess is identified. The hip joint is maintained. Moderate degenerative changes are noted at the knee, mainly involving the medial compartment with joint space narrowing, osteophytic spurring and mild chondrocalcinosis. Small knee joint effusion. The ankle joint is maintained. No findings suspicious for septic arthritis. No findings for  osteomyelitis involving the femur, tibia or fibula. No CT findings suspicious for myofasciitis or pyomyositis. The major venous structures are patent. No findings for deep venous thrombosis. The major arterial structures are also normal. No significant atherosclerotic disease. IMPRESSION: 1. CT findings suggest cellulitis involving the left lower extremity mainly below the knee. No discrete soft tissue abscess. 2. No findings for myofasciitis or pyomyositis. 3. No CT findings to suggest septic arthritis or osteomyelitis. 4. Unremarkable appearance of the major arterial vascular structures in the left lower extremity. No findings for deep venous thrombosis. Electronically Signed   By: Marijo Sanes M.D.   On: 12/27/2018 17:02   Ct Femur Left W Contrast  Result Date: 12/27/2018 CLINICAL DATA:  Left lower extremity pain and swelling. EXAM: CT OF THE LOWER RIGHT EXTREMITY WITH CONTRAST TECHNIQUE: Multidetector CT imaging of the lower right extremity was performed according to the standard protocol following intravenous contrast administration. COMPARISON:  None. CONTRAST:  184mL OMNIPAQUE IOHEXOL 350 MG/ML SOLN FINDINGS: Subcutaneous soft tissue swelling/edema/fluid mainly below the knee suggesting cellulitis. No discrete drainable rim enhancing soft tissue abscess is identified. The hip joint is maintained. Moderate degenerative changes are noted at the knee, mainly involving the medial compartment with joint space narrowing, osteophytic spurring and mild chondrocalcinosis. Small knee joint effusion. The ankle joint is maintained. No findings suspicious for septic arthritis. No findings for osteomyelitis involving the femur, tibia or fibula. No CT findings suspicious for myofasciitis or pyomyositis. The major venous structures are patent. No findings for deep venous thrombosis. The major arterial structures are also normal. No significant atherosclerotic  disease. IMPRESSION: 1. CT findings suggest cellulitis  involving the left lower extremity mainly below the knee. No discrete soft tissue abscess. 2. No findings for myofasciitis or pyomyositis. 3. No CT findings to suggest septic arthritis or osteomyelitis. 4. Unremarkable appearance of the major arterial vascular structures in the left lower extremity. No findings for deep venous thrombosis. Electronically Signed   By: Marijo Sanes M.D.   On: 12/27/2018 17:02   Ct Venogram Abd/pel  Result Date: 12/27/2018 CLINICAL DATA:  Left lower extremity pain and swelling. EXAM: CT OF THE LOWER RIGHT EXTREMITY WITH CONTRAST TECHNIQUE: Multidetector CT imaging of the lower right extremity was performed according to the standard protocol following intravenous contrast administration. COMPARISON:  None. CONTRAST:  143mL OMNIPAQUE IOHEXOL 350 MG/ML SOLN FINDINGS: Subcutaneous soft tissue swelling/edema/fluid mainly below the knee suggesting cellulitis. No discrete drainable rim enhancing soft tissue abscess is identified. The hip joint is maintained. Moderate degenerative changes are noted at the knee, mainly involving the medial compartment with joint space narrowing, osteophytic spurring and mild chondrocalcinosis. Small knee joint effusion. The ankle joint is maintained. No findings suspicious for septic arthritis. No findings for osteomyelitis involving the femur, tibia or fibula. No CT findings suspicious for myofasciitis or pyomyositis. The major venous structures are patent. No findings for deep venous thrombosis. The major arterial structures are also normal. No significant atherosclerotic disease. IMPRESSION: 1. CT findings suggest cellulitis involving the left lower extremity mainly below the knee. No discrete soft tissue abscess. 2. No findings for myofasciitis or pyomyositis. 3. No CT findings to suggest septic arthritis or osteomyelitis. 4. Unremarkable appearance of the major arterial vascular structures in the left lower extremity. No findings for deep venous  thrombosis. Electronically Signed   By: Marijo Sanes M.D.   On: 12/27/2018 17:02    Assessment/Plan: This is a 53 y.o. female with left lower extremity cellulitis.  WBC improved today, clinically patient feels better today.  Reminded patient to continue leg elevation to help with edema.  Continue antibiotics for now.  No need for debridement at this point.  Please consult again if any worsening for re-evaluation.   Melvyn Neth, [facility]

## 2018-12-28 NOTE — Evaluation (Signed)
Physical Therapy Evaluation Patient Details Name: Sarah Medina MRN: [id number] DOB: 12/20/1965 Today's Date: 12/28/2018   History of Present Illness  presented to ER secondary to general malaise, nausea/vomiting, abdominal pain; admitted for management of sepsis secondary to UTI (ecoli) and L LE cellulitis.  Clinical Impression  Upon evaluation, patient alert and oriented; follows all commands and demonstrates good effort with all functional activities.  Bilat UE/LE strength and ROM grossly symmetrical and WFL; no focal weakness, sensory deficit appreciated.  Mod edema throughout L LE, distal to knee.  Currently requiring min assist for bed mobility; cga/min assist for sit/stand, basic transfers and gait (35') with RW, cga/min assist.  Demonstrates broad BOS, forward flexed posture with mod WBing bilat UEs; choppy steps with decreased stance time/weight shift to L LE (due to pain).  Pain increase to 5/10 with WBing (gait distance limited as result). Would benefit from skilled PT to address above deficits and promote optimal return to PLOF; Recommend transition to [place] upon discharge from acute hospitalization.     Follow Up Recommendations Home health PT    Equipment Recommendations       Recommendations for Other Services       Precautions / Restrictions Precautions Precautions: Fall Restrictions Weight Bearing Restrictions: No      Mobility  Bed Mobility Overal bed mobility: Needs Assistance Bed Mobility: Supine to Sit     Supine to sit: Min guard;Min assist     General bed mobility comments: for truncal elevation, though reaches for therapist before attempting indep (likely able to complete indep)  Transfers Overall transfer level: Needs assistance Equipment used: Rolling walker (2 wheeled) Transfers: Sit to/from Stand Sit to Stand: Min guard         General transfer comment: increased time, use of momentum; heavy use of bilat UEs to complete movement  transition  Ambulation/Gait Ambulation/Gait assistance: Min assist Gait Distance (Feet): 80 Feet Assistive device: Rolling walker (2 wheeled)       General Gait Details: broad BOS, forward flexed posture with mod WBing bilat UEs; choppy steps with decreased stance time/weight shift to L LE (due to pain).  Pain increase to 5/10 with WBing.  Stairs            Wheelchair Mobility    Modified Rankin (Stroke Patients Only)       Balance Overall balance assessment: Needs assistance Sitting-balance support: No upper extremity supported;Feet supported Sitting balance-Leahy Scale: Good     Standing balance support: Bilateral upper extremity supported Standing balance-Leahy Scale: Fair                               Pertinent Vitals/Pain Pain Assessment: 0-10 Pain Score: 3  Pain Location: L LE Pain Descriptors / Indicators: Aching;Guarding;Grimacing Pain Intervention(s): Limited activity within patient's tolerance;Monitored during session;Repositioned    Home Living Family/patient expects to be discharged to:: Private residence Living Arrangements: Spouse/significant other Available Help at Discharge: Family Type of Home: Apartment Home Access: Level entry     Home Layout: One level        Prior Function Level of Independence: Independent         Comments: Indep with ADLs, household and community mobilization without assist device     Hand Dominance        Extremity/Trunk Assessment   Upper Extremity Assessment Upper Extremity Assessment: Overall WFL for tasks assessed    Lower Extremity Assessment Lower Extremity Assessment: Overall WFL for  tasks assessed(grossly 4-/5 throughout; generalized edema noted throughout L LE distal to knee)       Communication   Communication: No difficulties  Cognition Arousal/Alertness: Awake/alert Behavior During Therapy: WFL for tasks assessed/performed Overall Cognitive Status: Within Functional  Limits for tasks assessed                                        General Comments      Exercises     Assessment/Plan    PT Assessment Patient needs continued PT services  PT Problem List Decreased strength;Decreased range of motion;Decreased activity tolerance;Decreased balance;Decreased mobility;Decreased knowledge of use of DME;Decreased safety awareness;Decreased knowledge of precautions;Obesity;Pain       PT Treatment Interventions DME instruction;Gait training;Functional mobility training;Therapeutic activities;Therapeutic exercise;Balance training;Patient/family education    PT Goals (Current goals can be found in the Care Plan section)  Acute Rehab PT Goals Patient Stated Goal: to return home; agreeable to session PT Goal Formulation: With patient Time For Goal Achievement: 01/11/19 Potential to Achieve Goals: Good    Frequency Min 2X/week   Barriers to discharge        Co-evaluation               AM-PAC PT "6 Clicks" Mobility  Outcome Measure Help needed turning from your back to your side while in a flat bed without using bedrails?: None Help needed moving from lying on your back to sitting on the side of a flat bed without using bedrails?: None Help needed moving to and from a bed to a chair (including a wheelchair)?: A Little Help needed standing up from a chair using your arms (e.g., wheelchair or bedside chair)?: A Little Help needed to walk in hospital room?: A Little Help needed climbing 3-5 steps with a railing? : A Little 6 Click Score: 20    End of Session Equipment Utilized During Treatment: Gait belt Activity Tolerance: Patient tolerated treatment well Patient left: in chair;with chair alarm set;with call bell/phone within reach Nurse Communication: Mobility status PT Visit Diagnosis: Difficulty in walking, not elsewhere classified (R26.2);Muscle weakness (generalized) (M62.81)    Time: 3295-1884 PT Time Calculation (min)  (ACUTE ONLY): 16 min   Charges:   PT Evaluation $PT Eval Moderate Complexity: 1 Mod          Saroya Riccobono H. Owens Shark, PT, DPT, NCS 12/28/18, 11:19 AM [phone]

## 2018-12-29 LAB — POTASSIUM: Potassium: 3.8 mmol/L (ref 3.5–5.1)

## 2018-12-29 LAB — MAGNESIUM: Magnesium: 1.6 mg/dL — ABNORMAL LOW (ref 1.7–2.4)

## 2018-12-29 LAB — CREATININE, SERUM
Creatinine, Ser: 0.75 mg/dL (ref 0.44–1.00)
GFR calc Af Amer: >60 mL/min (ref >60)
GFR calc non Af Amer: >60 mL/min (ref >60)

## 2018-12-29 MED ORDER — MAGNESIUM SULFATE 2 GM/50ML IV SOLN
2.0000 g | Freq: Once | INTRAVENOUS | Status: AC
  Administered 2018-12-29: 2 g via INTRAVENOUS
  Filled 2018-12-29: qty 50

## 2018-12-29 MED ORDER — ENOXAPARIN SODIUM 40 MG/0.4ML ~~LOC~~ SOLN
40.0000 mg | Freq: Two times a day (BID) | SUBCUTANEOUS | Status: DC
  Administered 2018-12-29: 40 mg via SUBCUTANEOUS
  Filled 2018-12-29: qty 0.4

## 2018-12-29 MED ORDER — LORATADINE 10 MG PO TABS: 10.0000 mg | ORAL_TABLET | Freq: Every day | ORAL | Status: DC

## 2018-12-29 MED ORDER — FLUTICASONE PROPIONATE 50 MCG/ACT NA SUSP: 1.0000 | Freq: Every day | NASAL | Status: DC

## 2018-12-29 MED ORDER — SULFAMETHOXAZOLE-TRIMETHOPRIM 800-160 MG PO TABS: 1.0000 | ORAL_TABLET | Freq: Two times a day (BID) | ORAL | 0 refills | Status: AC

## 2018-12-29 MED ORDER — MEDROXYPROGESTERONE ACETATE 10 MG PO TABS: 10.0000 mg | ORAL_TABLET | Freq: Every day | ORAL | Status: DC

## 2018-12-29 NOTE — TOC Transition Note (Signed)
Transition of Care [facility]) - CM/SW Discharge Note   Patient Details  Name: Sarah Medina MRN: [id number] Date of Birth: Nov 30, 1965  Transition of Care [facility] CM/SW Contact:  Weston Anna, LCSW Phone Number: 12/29/2018, 10:02 AM   Clinical Narrative:     CSW met with patient via bedside to discuss discharge plans- PT is currently recommending HH at discharge. Patient has declines [place] services at this time due to not wanting individuals in her house hold (covid-19 pandemic). Patient stated she felt comfortable with returning home with her spouse at this time and felt he would be able to meet her needs.   Patient encouraged to follow up with her PCP if she has any concerns or determines she would like to start home health in the future   Final next level of care: Home/Self Care Barriers to Discharge: No Barriers Identified   Patient Goals and CMS Choice Patient states their goals for this hospitalization and ongoing recovery are:: Getting back home with he husband CMS Medicare.gov Compare Post Acute Care list provided to:: Patient Choice offered to / list presented to : Patient  Discharge Placement                       Discharge Plan and Services In-house Referral: NA Discharge Planning Services: NA Post Acute Care Choice: NA                    HH Arranged: NA          Social Determinants of Health (SDOH) Interventions     Readmission Risk Interventions No flowsheet data found.

## 2018-12-29 NOTE — Progress Notes (Signed)
Anticoagulation monitoring(Lovenox):  53yo  female ordered Lovenox 40 mg Q24h  Filed Weights   12/25/18 0912  Weight: 280 lb (127 kg)   BMI 45.19   Lab Results  Component Value Date   CREATININE 0.72 12/28/2018   CREATININE 0.82 12/27/2018   CREATININE 1.08 (H) 12/26/2018   Estimated Creatinine Clearance: 109.7 mL/min (by C-G formula based on SCr of 0.72 mg/dL). Hemoglobin & Hematocrit     Component Value Date/Time   HGB 9.1 (L) 12/28/2018 0346   HGB 12.4 12/25/2011 1332   HCT 29.0 (L) 12/28/2018 0346   HCT 39.1 12/25/2011 1332     Per Protocol for Patient with estCrcl > 30 ml/min and BMI > 40, will transition to Lovenox 40 mg Q12h.

## 2018-12-29 NOTE — Discharge Summary (Signed)
[facility] NAME: Sarah Medina    MR#:  [id number]  DATE OF BIRTH:  Mar 09, 1966  DATE OF ADMISSION:  12/25/2018 ADMITTING PHYSICIAN: Lang Snow, NP  DATE OF DISCHARGE: 12/29/2018  PRIMARY CARE PHYSICIAN: Perrin Maltese, MD    ADMISSION DIAGNOSIS:  Urinary tract infection without hematuria, site unspecified [N39.0] Sepsis, due to unspecified organism, unspecified whether acute organ dysfunction present ([place]) [A41.9]  DISCHARGE DIAGNOSIS:  Active Problems:   Sepsis ([place])   Urinary tract infection without hematuria   Leg swelling   Positive D dimer   Cellulitis of left lower extremity   SECONDARY DIAGNOSIS:   Past Medical History:  Diagnosis Date  . Arthritis    hips, right leg  . CAD in native artery, RCA, LAD and LCX 07/23/2015  . Carotid artery occlusion   . H/O myocardial infarction less than 8 weeks- STEMI inf wall 07/20/15- stent to RCA 07/20/15   inf wall STEMI with DES to RCA  . Hypercholesteremia   . Hypertension   . Periorbital edema    treated with steroids  . Seizures ([place])    as teenager, ages [age].  none since  . STEMI (ST elevation myocardial infarction) ([place]) 07/23/15    HOSPITAL COURSE:   53 y.o.femalewith pertinent past medical history ofCAD, hypertension, hyperlipidemia, and MI presenting to the ED with chief complaints of generalized fatigue, fevers and chills since 12/26/2018  1. Sepsis -Patient presenting with fever 100.6, tachycardia, hypotension, elevated lactic acid, leukocytosis and elevated procalcitonin. -Source of sepsis was secondary to UTI combined with left lower extremity cellulitis.  Patient's blood cultures remain negative.  She was treated with IV ceftriaxone and vancomycin.  Currently afebrile and hemodynamically stable. -Urine cultures are positive for E. Coli.  Patient is now being discharged on oral Bactrim for another week.  2. UTI - UA shows UTI - Urine  culture shows E.Coli and pt. Has been adequately treated for her UTI now.    3.Hypokalemia -  Improved and resolved with supplementation.    4.Elevated D-dimer -  - Likely due to sepsis/Cellulitis - No evidence of DIC, hemolysis or signs of PE -Fibrinogenelevated - Haptoglobin, LDH, PT/INRnormal - appreciate Oncology input and they think this was more sepsis related too.   5. Left lower extremities swelling and erythema-due to cellulitis - [place] was negative for DVT.  CT venogram also showing no evidence of myositis, compartment syndrome/pyomyositis.  No thrombosis or abscess noted.  Both vascular surgery and general surgery was consulted.  They think this was likely due to cellulitis. - Patient was treated with IV vancomycin for couple days and now being discharged on oral Bactrim.  She was advised to keep her leg elevated. -Outpatient follow-up with vascular surgery.  6.AKI- Improving -Prerenal azotemia and improved and resolved with IV fluid hydration.  Creatinine is at baseline now.  7.Coronary Artery Disease-in native artery, right CEA, LAD, and LCx, history of MI withDES to RCA - no acute chest pain while in hospital. Cont. ASA, Plavix, metoprolol, statin.   8.HLD  + Goal LDL<100 - cont. Atorvastatin40mg  PO qhs  9.HTN - BP initially was on low side while in hospital and anti-HTN help but will resume them upon discharge.    DISCHARGE CONDITIONS:   Stable.   CONSULTS OBTAINED:    DRUG ALLERGIES:   Allergies  Allergen Reactions  . Enalapril Swelling    Swelling face    DISCHARGE MEDICATIONS:   Allergies as of  12/29/2018      Reactions   Enalapril Swelling   Swelling face      Medication List    TAKE these medications   aspirin EC 81 MG tablet Take 81 mg by mouth daily.   atorvastatin 80 MG tablet Commonly known as: LIPITOR Take 1 tablet (80 mg total) by mouth daily at 6 PM. What changed: how much to take   cholecalciferol 10 MCG  (400 UNIT) Tabs tablet Commonly known as: VITAMIN D3 Take 50,000 Units by mouth every Monday.   clopidogrel 75 MG tablet Commonly known as: PLAVIX Take 1 tablet (75 mg total) by mouth daily.   fluticasone 50 MCG/ACT nasal spray Commonly known as: FLONASE Place 1 spray into both nostrils daily.   hydrochlorothiazide 12.5 MG tablet Commonly known as: HYDRODIURIL Take 12.5 mg by mouth daily.   loratadine 10 MG tablet Commonly known as: Claritin Take 1 tablet (10 mg total) by mouth daily.   losartan 50 MG tablet Commonly known as: COZAAR Take 50 mg by mouth daily.   medroxyPROGESTERone 10 MG tablet Commonly known as: PROVERA Take 1 tablet (10 mg total) by mouth daily.   metoprolol tartrate 25 MG tablet Commonly known as: LOPRESSOR Take 0.5 tablets (12.5 mg total) by mouth 2 (two) times daily.   nitroGLYCERIN 0.4 MG SL tablet Commonly known as: NITROSTAT Place 1 tablet (0.4 mg total) under the tongue every 5 (five) minutes x 3 doses as needed for chest pain.   sulfamethoxazole-trimethoprim 800-160 MG tablet Commonly known as: BACTRIM DS Take 1 tablet by mouth 2 (two) times daily for 7 days.         DISCHARGE INSTRUCTIONS:   DIET:  Cardiac diet  DISCHARGE CONDITION:  Stable  ACTIVITY:  Activity as tolerated  OXYGEN:  Home Oxygen: No.   Oxygen Delivery: room air  DISCHARGE LOCATION:  home   If you experience worsening of your admission symptoms, develop shortness of breath, life threatening emergency, suicidal or homicidal thoughts you must seek medical attention immediately by calling 911 or calling your MD immediately  if symptoms less severe.  You Must read complete instructions/literature along with all the possible adverse reactions/side effects for all the Medicines you take and that have been prescribed to you. Take any new Medicines after you have completely understood and accpet all the possible adverse reactions/side effects.   Please note  You  were cared for by a hospitalist during your hospital stay. If you have any questions about your discharge medications or the care you received while you were in the hospital after you are discharged, you can call the unit and asked to speak with the hospitalist on call if the hospitalist that took care of you is not available. Once you are discharged, your primary care physician will handle any further medical issues. Please note that NO REFILLS for any discharge medications will be authorized once you are discharged, as it is imperative that you return to your primary care physician (or establish a relationship with a primary care physician if you do not have one) for your aftercare needs so that they can reassess your need for medications and monitor your lab values.     Today   Still having some pain in that left lower extremity but improved.  Afebrile, hemodynamically stable.  Blood cultures remain negative.  Worked with physical therapy and the recommend home health services but the patient refused.  Will discharge home today.  VITAL SIGNS:  Blood pressure 128/69, pulse  74, temperature 98.4 F (36.9 C), resp. rate 15, height 5\' 6"  (1.676 m), weight 127 kg, last menstrual period 05/19/2015, SpO2 98 %.  I/O:    Intake/Output Summary (Last 24 hours) at 12/29/2018 1144 Last data filed at 12/29/2018 0952 Gross per 24 hour  Intake 698.74 ml  Output -  Net 698.74 ml    PHYSICAL EXAMINATION:  GENERAL:  53 y.o.-year-old patient lying in the bed with no acute distress.  EYES: Pupils equal, round, reactive to light and accommodation. No scleral icterus. Extraocular muscles intact.  HEENT: Head atraumatic, normocephalic. Oropharynx and nasopharynx clear.  NECK:  Supple, no jugular venous distention. No thyroid enlargement, no tenderness.  LUNGS: Normal breath sounds bilaterally, no wheezing, rales,rhonchi. No use of accessory muscles of respiration.  CARDIOVASCULAR: S1, S2 normal. No murmurs,  rubs, or gallops.  ABDOMEN: Soft, non-tender, non-distended. Bowel sounds present. No organomegaly or mass.  EXTREMITIES: + 1 edema Left > right, No cyanosis, or clubbing.  NEUROLOGIC: Cranial nerves II through XII are intact. No focal motor or sensory defecits b/l.  PSYCHIATRIC: The patient is alert and oriented x 3.   SKIN: No obvious rash, lesion, or ulcer.   DATA REVIEW:   CBC Recent Labs  Lab 12/28/18 0346  WBC 9.1  HGB 9.1*  HCT 29.0*  PLT 260    Chemistries  Recent Labs  Lab 12/25/18 0816  12/28/18 0346 12/29/18 0806  NA  --    < > 139  --   K  --    < > 3.4* 3.8  CL  --    < > 107  --   CO2  --    < > 26  --   GLUCOSE  --    < > 96  --   BUN  --    < > 9  --   CREATININE  --    < > 0.72 0.75  CALCIUM  --    < > 7.5*  --   MG  --   --   --  1.6*  AST 29  --   --   --   ALT 18  --   --   --   ALKPHOS 67  --   --   --   BILITOT 1.3*  --   --   --    < > = values in this interval not displayed.    Cardiac Enzymes No results for input(s): TROPONINI in the last 168 hours.  Microbiology Results  Results for orders placed or performed during the hospital encounter of 12/25/18  Blood Culture (routine x 2)     Status: None (Preliminary result)   Collection Time: 12/25/18  9:31 AM   Specimen: BLOOD  Result Value Ref Range Status   Specimen Description   Final    BLOOD RIGHT ANTECUBITAL Blood Culture results may not be optimal due to an excessive volume of blood received in culture bottles   Special Requests NONE  Final   Culture   Final    NO GROWTH 4 DAYS Performed at [facility], [address], [place], [place] [address]    Report Status PENDING  Incomplete  SARS Coronavirus 2 (CEPHEID- Performed in [facility] [place] hospital lab), Hosp Order     Status: None   Collection Time: 12/25/18  9:32 AM   Specimen: Nasopharyngeal Swab  Result Value Ref Range Status   SARS Coronavirus 2 NEGATIVE NEGATIVE Final    Comment: (NOTE) If result  is  NEGATIVE SARS-CoV-2 target nucleic acids are NOT DETECTED. The SARS-CoV-2 RNA is generally detectable in upper and lower  respiratory specimens during the acute phase of infection. The lowest  concentration of SARS-CoV-2 viral copies this assay can detect is 250  copies / mL. A negative result does not preclude SARS-CoV-2 infection  and should not be used as the sole basis for treatment or other  patient management decisions.  A negative result may occur with  improper specimen collection / handling, submission of specimen other  than nasopharyngeal swab, presence of viral mutation(s) within the  areas targeted by this assay, and inadequate number of viral copies  (<250 copies / mL). A negative result must be combined with clinical  observations, patient history, and epidemiological information. If result is POSITIVE SARS-CoV-2 target nucleic acids are DETECTED. The SARS-CoV-2 RNA is generally detectable in upper and lower  respiratory specimens dur ing the acute phase of infection.  Positive  results are indicative of active infection with SARS-CoV-2.  Clinical  correlation with patient history and other diagnostic information is  necessary to determine patient infection status.  Positive results do  not rule out bacterial infection or co-infection with other viruses. If result is PRESUMPTIVE POSTIVE SARS-CoV-2 nucleic acids MAY BE PRESENT.   A presumptive positive result was obtained on the submitted specimen  and confirmed on repeat testing.  While 2019 novel coronavirus  (SARS-CoV-2) nucleic acids may be present in the submitted sample  additional confirmatory testing may be necessary for epidemiological  and / or clinical management purposes  to differentiate between  SARS-CoV-2 and other Sarbecovirus currently known to infect humans.  If clinically indicated additional testing with an alternate test  methodology [phone]) is advised. The SARS-CoV-2 RNA is generally  detectable  in upper and lower respiratory sp ecimens during the acute  phase of infection. The expected result is Negative. Fact Sheet for Patients:  StrictlyIdeas.no Fact Sheet for Healthcare Providers: BankingDealers.co.za This test is not yet approved or cleared by the [place] FDA and has been authorized for detection and/or diagnosis of SARS-CoV-2 by FDA under an Emergency Use Authorization (EUA).  This EUA will remain in effect (meaning this test can be used) for the duration of the COVID-19 declaration under Section 564(b)(1) of the Act, 21 U.S.C. section 360bbb-3(b)(1), unless the authorization is terminated or revoked sooner. Performed at [facility], [address] [place], [place] [address]   Blood Culture (routine x 2)     Status: None (Preliminary result)   Collection Time: 12/25/18  9:36 AM   Specimen: BLOOD  Result Value Ref Range Status   Specimen Description   Final    BLOOD BLOOD LEFT HAND Blood Culture results may not be optimal due to an excessive volume of blood received in culture bottles   Special Requests NONE  Final   Culture   Final    NO GROWTH 4 DAYS Performed at [facility], [address], [place], [place] [address]    Report Status PENDING  Incomplete  Urine culture     Status: Abnormal   Collection Time: 12/25/18 10:27 AM   Specimen: Urine, Random  Result Value Ref Range Status   Specimen Description   Final    URINE, RANDOM Performed at [facility], [address] [place], [place] [address]    Special Requests   Final    NONE Performed at [facility], [address] [place],  [address]  Culture >=100,000 COLONIES/mL ESCHERICHIA COLI (A)  Final   Report Status 12/27/2018 FINAL  Final   Organism ID, Bacteria ESCHERICHIA COLI (A)  Final      Susceptibility   Escherichia coli - MIC*    AMPICILLIN >=32 RESISTANT Resistant     CEFAZOLIN <=4  SENSITIVE Sensitive     CEFTRIAXONE <=1 SENSITIVE Sensitive     CIPROFLOXACIN <=0.25 SENSITIVE Sensitive     GENTAMICIN <=1 SENSITIVE Sensitive     IMIPENEM <=0.25 SENSITIVE Sensitive     NITROFURANTOIN <=16 SENSITIVE Sensitive     TRIMETH/SULFA <=20 SENSITIVE Sensitive     AMPICILLIN/SULBACTAM >=32 RESISTANT Resistant     PIP/TAZO <=4 SENSITIVE Sensitive     Extended ESBL NEGATIVE Sensitive     * >=100,000 COLONIES/mL ESCHERICHIA COLI    RADIOLOGY:  Ct Tibia Fibula Left W Contrast  Result Date: 12/27/2018 CLINICAL DATA:  Left lower extremity pain and swelling. EXAM: CT OF THE LOWER RIGHT EXTREMITY WITH CONTRAST TECHNIQUE: Multidetector CT imaging of the lower right extremity was performed according to the standard protocol following intravenous contrast administration. COMPARISON:  None. CONTRAST:  123mL OMNIPAQUE IOHEXOL 350 MG/ML SOLN FINDINGS: Subcutaneous soft tissue swelling/edema/fluid mainly below the knee suggesting cellulitis. No discrete drainable rim enhancing soft tissue abscess is identified. The hip joint is maintained. Moderate degenerative changes are noted at the knee, mainly involving the medial compartment with joint space narrowing, osteophytic spurring and mild chondrocalcinosis. Small knee joint effusion. The ankle joint is maintained. No findings suspicious for septic arthritis. No findings for osteomyelitis involving the femur, tibia or fibula. No CT findings suspicious for myofasciitis or pyomyositis. The major venous structures are patent. No findings for deep venous thrombosis. The major arterial structures are also normal. No significant atherosclerotic disease. IMPRESSION: 1. CT findings suggest cellulitis involving the left lower extremity mainly below the knee. No discrete soft tissue abscess. 2. No findings for myofasciitis or pyomyositis. 3. No CT findings to suggest septic arthritis or osteomyelitis. 4. Unremarkable appearance of the major arterial vascular  structures in the left lower extremity. No findings for deep venous thrombosis. Electronically Signed   By: Marijo Sanes M.D.   On: 12/27/2018 17:02   Ct Femur Left W Contrast  Result Date: 12/27/2018 CLINICAL DATA:  Left lower extremity pain and swelling. EXAM: CT OF THE LOWER RIGHT EXTREMITY WITH CONTRAST TECHNIQUE: Multidetector CT imaging of the lower right extremity was performed according to the standard protocol following intravenous contrast administration. COMPARISON:  None. CONTRAST:  120mL OMNIPAQUE IOHEXOL 350 MG/ML SOLN FINDINGS: Subcutaneous soft tissue swelling/edema/fluid mainly below the knee suggesting cellulitis. No discrete drainable rim enhancing soft tissue abscess is identified. The hip joint is maintained. Moderate degenerative changes are noted at the knee, mainly involving the medial compartment with joint space narrowing, osteophytic spurring and mild chondrocalcinosis. Small knee joint effusion. The ankle joint is maintained. No findings suspicious for septic arthritis. No findings for osteomyelitis involving the femur, tibia or fibula. No CT findings suspicious for myofasciitis or pyomyositis. The major venous structures are patent. No findings for deep venous thrombosis. The major arterial structures are also normal. No significant atherosclerotic disease. IMPRESSION: 1. CT findings suggest cellulitis involving the left lower extremity mainly below the knee. No discrete soft tissue abscess. 2. No findings for myofasciitis or pyomyositis. 3. No CT findings to suggest septic arthritis or osteomyelitis. 4. Unremarkable appearance of the major arterial vascular structures in the left lower extremity. No findings for deep venous thrombosis. Electronically Signed  By: Marijo Sanes M.D.   On: 12/27/2018 17:02   Ct Venogram Abd/pel  Result Date: 12/27/2018 CLINICAL DATA:  Left lower extremity pain and swelling. EXAM: CT OF THE LOWER RIGHT EXTREMITY WITH CONTRAST TECHNIQUE:  Multidetector CT imaging of the lower right extremity was performed according to the standard protocol following intravenous contrast administration. COMPARISON:  None. CONTRAST:  137mL OMNIPAQUE IOHEXOL 350 MG/ML SOLN FINDINGS: Subcutaneous soft tissue swelling/edema/fluid mainly below the knee suggesting cellulitis. No discrete drainable rim enhancing soft tissue abscess is identified. The hip joint is maintained. Moderate degenerative changes are noted at the knee, mainly involving the medial compartment with joint space narrowing, osteophytic spurring and mild chondrocalcinosis. Small knee joint effusion. The ankle joint is maintained. No findings suspicious for septic arthritis. No findings for osteomyelitis involving the femur, tibia or fibula. No CT findings suspicious for myofasciitis or pyomyositis. The major venous structures are patent. No findings for deep venous thrombosis. The major arterial structures are also normal. No significant atherosclerotic disease. IMPRESSION: 1. CT findings suggest cellulitis involving the left lower extremity mainly below the knee. No discrete soft tissue abscess. 2. No findings for myofasciitis or pyomyositis. 3. No CT findings to suggest septic arthritis or osteomyelitis. 4. Unremarkable appearance of the major arterial vascular structures in the left lower extremity. No findings for deep venous thrombosis. Electronically Signed   By: Marijo Sanes M.D.   On: 12/27/2018 17:02      Management plans discussed with the patient, family and they are in agreement.  CODE STATUS:     Code Status Orders  (From admission, onward)         Start     Ordered   12/25/18 1207  Full code  Continuous     12/25/18 1215       TOTAL TIME TAKING CARE OF THIS PATIENT: 40 minutes.    Henreitta Leber M.D on 12/29/2018 at 11:44 AM  Between 7am to 6pm - Pager - [phone]  After 6pm go to [email or web address] - Proofreader  [facility] [place] [facility]   Office  [phone]  CC: Primary care physician; Perrin Maltese, MD

## 2018-12-30 LAB — CULTURE, BLOOD (ROUTINE X 2)
Culture: NO GROWTH
Culture: NO GROWTH

## 2019-01-07 ENCOUNTER — Other Ambulatory Visit: Payer: Self-pay | Admitting: Family Medicine

## 2019-01-07 ENCOUNTER — Other Ambulatory Visit: Payer: Self-pay | Admitting: Internal Medicine

## 2019-01-07 ENCOUNTER — Emergency Department: Payer: Medicare Other

## 2019-01-07 ENCOUNTER — Ambulatory Visit: Payer: Medicare Other

## 2019-01-07 ENCOUNTER — Emergency Department
Admission: EM | Admit: 2019-01-07 | Discharge: 2019-01-07 | Disposition: A | Discharge status: Home / Self Care | Payer: Medicare Other | Attending: Emergency Medicine | Admitting: Emergency Medicine

## 2019-01-07 ENCOUNTER — Encounter: Payer: Self-pay | Admitting: Emergency Medicine

## 2019-01-07 ENCOUNTER — Other Ambulatory Visit: Payer: Medicare Other

## 2019-01-07 ENCOUNTER — Other Ambulatory Visit: Payer: Self-pay

## 2019-01-07 DIAGNOSIS — Z7982 Long term (current) use of aspirin: Secondary | ICD-10-CM | POA: Insufficient documentation

## 2019-01-07 DIAGNOSIS — I252 Old myocardial infarction: Secondary | ICD-10-CM | POA: Diagnosis not present

## 2019-01-07 DIAGNOSIS — I10 Essential (primary) hypertension: Secondary | ICD-10-CM | POA: Insufficient documentation

## 2019-01-07 DIAGNOSIS — Z79899 Other long term (current) drug therapy: Secondary | ICD-10-CM | POA: Diagnosis not present

## 2019-01-07 DIAGNOSIS — Z87891 Personal history of nicotine dependence: Secondary | ICD-10-CM | POA: Insufficient documentation

## 2019-01-07 DIAGNOSIS — R609 Edema, unspecified: Secondary | ICD-10-CM | POA: Insufficient documentation

## 2019-01-07 DIAGNOSIS — I2510 Atherosclerotic heart disease of native coronary artery without angina pectoris: Secondary | ICD-10-CM | POA: Diagnosis not present

## 2019-01-07 DIAGNOSIS — R2240 Localized swelling, mass and lump, unspecified lower limb: Secondary | ICD-10-CM

## 2019-01-07 DIAGNOSIS — R102 Pelvic and perineal pain: Secondary | ICD-10-CM

## 2019-01-07 DIAGNOSIS — M79662 Pain in left lower leg: Secondary | ICD-10-CM | POA: Diagnosis present

## 2019-01-07 NOTE — ED Provider Notes (Signed)
[facility] Emergency Department Provider Note ____________________________________________  Time seen: Approximately 8:41 PM  I have reviewed the triage vital signs and the nursing notes.   HISTORY  Chief Complaint Leg Pain    HPI Meya Clutter is a 53 y.o. female who presents to the emergency department for evaluation and treatment of left lower extremity pain and that started a few days ago. She was evaluated by her PCP who gave her a "fluid pill and a pain pill." The swelling has gone down some, but the pain is still present. Her PCP sent her to the ER for [place] to look for DVT. She denies chest pain or shortness of breath.   Past Medical History:  Diagnosis Date  . Arthritis    hips, right leg  . CAD in native artery, RCA, LAD and LCX 07/23/2015  . Carotid artery occlusion   . H/O myocardial infarction less than 8 weeks- STEMI inf wall 07/20/15- stent to RCA 07/20/15   inf wall STEMI with DES to RCA  . Hypercholesteremia   . Hypertension   . Periorbital edema    treated with steroids  . Seizures ([place])    as teenager, ages [age].  none since  . STEMI (ST elevation myocardial infarction) ([place]) 07/23/15    Patient Active Problem List   Diagnosis Date Noted  . Cellulitis of left lower extremity   . Urinary tract infection without hematuria   . Leg swelling   . Positive D dimer   . Sepsis ([place]) 12/25/2018  . Chest pain, rule out acute myocardial infarction 07/11/2016  . Ischemic heart disease 10/20/2015  . Essential hypertension 10/20/2015  . Hyperlipidemia 10/20/2015  . Acute posthemorrhagic anemia   . H/O myocardial infarction less than 8 weeks- STEMI inf wall 07/20/15- stent to RCA 07/23/2015  . CAD in native artery, RCA, LAD and LCX 07/23/2015  . Ventricular fibrillation ([place]) 07/23/2015  . ST elevation myocardial infarction (STEMI) of inferior wall ([place]) 07/20/2015  . Abnormal LFTs 12/23/2014  . Osteoarthritis of right knee 11/24/2014     Past Surgical History:  Procedure Laterality Date  . CARDIAC CATHETERIZATION N/A 07/20/2015   Procedure: Left Heart Cath and Coronary Angiography;  Surgeon: Isaias Cowman, MD;  Location: [place] [facility];  Service: Cardiovascular;  Laterality: N/A;  . CARDIAC CATHETERIZATION N/A 07/20/2015   Procedure: Coronary Stent Intervention;  Surgeon: Isaias Cowman, MD;  Location: [place] CV [facility];  Service: Cardiovascular;  Laterality: N/A;  . CARDIAC CATHETERIZATION N/A 07/23/2015   Procedure: Left Heart Cath and Coronary Angiography;  Surgeon: Lorretta Harp, MD;  Location: [place] CV [facility];  Service: Cardiovascular;  Laterality: N/A;  . CARDIAC CATHETERIZATION N/A 07/23/2015   Procedure: Coronary Balloon Angioplasty;  Surgeon: Lorretta Harp, MD;  Location: [place] CV [facility];  Service: Cardiovascular;  Laterality: N/A;  . CARDIAC CATHETERIZATION N/A 07/12/2016   Procedure: Left Heart Cath;  Surgeon: Teodoro Spray, MD;  Location: [place] [facility];  Service: Cardiovascular;  Laterality: N/A;  . CARDIAC CATHETERIZATION N/A 07/12/2016   Procedure: Left Heart Cath and Coronary Angiography;  Surgeon: Isaias Cowman, MD;  Location: [place] [facility];  Service: Cardiovascular;  Laterality: N/A;  . CARDIAC CATHETERIZATION N/A 07/12/2016   Procedure: Intravascular Pressure Wire/FFR Study;  Surgeon: Isaias Cowman, MD;  Location: [place] [facility];  Service: Cardiovascular;  Laterality: N/A;  . CARDIAC CATHETERIZATION N/A 07/12/2016   Procedure: Coronary Stent Intervention;  Surgeon: Isaias Cowman, MD;  Location: [place]  CV LAB;  Service: Cardiovascular;  Laterality: N/A;  . KNEE SURGERY Right 15 years ago    Prior to Admission medications   Medication Sig Start Date End Date Taking? Authorizing Provider  aspirin EC 81 MG tablet Take 81 mg by mouth daily.    [provider]  atorvastatin (LIPITOR) 80 MG tablet Take 1 tablet (80 mg total) by  mouth daily at 6 PM. Patient taking differently: Take 40 mg by mouth daily at 6 PM.  07/27/15   Eileen Stanford, PA-C  cholecalciferol (VITAMIN D) 400 units TABS tablet Take 50,000 Units by mouth every Monday.     [provider]  clopidogrel (PLAVIX) 75 MG tablet Take 1 tablet (75 mg total) by mouth daily. 01/26/16   Tawni Millers, MD  fluticasone (FLONASE) 50 MCG/ACT nasal spray Place 1 spray into both nostrils daily. 12/29/18   Henreitta Leber, MD  hydrochlorothiazide (HYDRODIURIL) 12.5 MG tablet Take 12.5 mg by mouth daily.    [provider]  loratadine (CLARITIN) 10 MG tablet Take 1 tablet (10 mg total) by mouth daily. 12/29/18   Henreitta Leber, MD  losartan (COZAAR) 50 MG tablet Take 50 mg by mouth daily.    [provider]  medroxyPROGESTERone (PROVERA) 10 MG tablet Take 1 tablet (10 mg total) by mouth daily. 12/29/18   Henreitta Leber, MD  metoprolol tartrate (LOPRESSOR) 25 MG tablet Take 0.5 tablets (12.5 mg total) by mouth 2 (two) times daily. 07/13/16   Hillary Bow, MD  nitroGLYCERIN (NITROSTAT) 0.4 MG SL tablet Place 1 tablet (0.4 mg total) under the tongue every 5 (five) minutes x 3 doses as needed for chest pain. 07/27/15   Eileen Stanford, PA-C    Allergies Enalapril  Family History  Problem Relation Age of Onset  . Breast cancer Sister 87    Social History Social History   Tobacco Use  . Smoking status: Never Smoker  . Smokeless tobacco: Former Systems developer    Types: Snuff  Substance Use Topics  . Alcohol use: No  . Drug use: Never    Review of Systems Constitutional: Negative for fever. Cardiovascular: Negative for chest pain. Respiratory: Negative for shortness of breath. Musculoskeletal: positive for LLE pain and swelling Skin: Positive for LLE swelling  Neurological: Negative for decrease in sensation  ____________________________________________   PHYSICAL EXAM:  VITAL SIGNS: ED Triage Vitals  Enc Vitals Group     BP  01/07/19 1247 118/84     Pulse Rate 01/07/19 1247 (!) 113     Resp 01/07/19 1247 (!) 22     Temp 01/07/19 1247 98.2 F (36.8 C)     Temp Source 01/07/19 1247 Oral     SpO2 01/07/19 1247 96 %     Weight 01/07/19 1248 280 lb (127 kg)     Height 01/07/19 1248 5\' 6"  (1.676 m)     Head Circumference --      Peak Flow --      Pain Score 01/07/19 1507 2     Pain Loc --      Pain Edu? --      Excl. in [place]? --     Constitutional: Alert and oriented. Well appearing and in no acute distress. Eyes: Conjunctivae are clear without discharge or drainage Head: Atraumatic Neck: Supple Cardiopulmonary: DP pulse present bilaterally, skin is warm, capillary refill is <3 Respiratory: No cough. Respirations are even and unlabored. Musculoskeletal: Diffuse swelling of the LLE from ankle to knee. Neurologic:  Motor and sensory is intact.  Skin: LLE is dry and peeling without obvious cellulitis or open lesions.  Psychiatric: Affect and behavior are appropriate.  ____________________________________________   LABS (all labs ordered are listed, but only abnormal results are displayed)  Labs Reviewed - No data to display ____________________________________________  RADIOLOGY  US of the LLE is negative for DVT. ____________________________________________   PROCEDURES  Procedures  ____________________________________________   INITIAL IMPRESSION / ASSESSMENT AND PLAN / ED COURSE  Mikailah Morel is a 53 y.o. who presents to the emergency department for treatment and evaluation of left lower extremity swelling.  Ultrasound is negative for DVT.  Primary care provider has already evaluated her for this complaint and simply wanted her to come here today for an ultrasound.  Sounds like the primary care provider told her that if the DVT work-up was negative that she should continue her fluid pill and pain medication until her scheduled follow-up appointment early next week. She will be  discharged home with strict ER return instructions.  Medications - No data to display  Pertinent labs & imaging results that were available during my care of the patient were reviewed by me and considered in my medical decision making (see chart for details).  _________________________________________   FINAL CLINICAL IMPRESSION(S) / ED DIAGNOSES  Final diagnoses:  Peripheral edema    ED Discharge Orders    None       If controlled substance prescribed during this visit, 12 month history viewed on the [place] prior to issuing an initial prescription for Schedule II or III opiod.   Victorino Dike, FNP 01/07/19 2050    Duffy Bruce, MD 01/08/19 1025

## 2019-01-07 NOTE — ED Triage Notes (Signed)
Says she has pain in lower left leg all the way around--calf and front.  She was admited last week for infection.  She does not have hany redness or weeping, but she has flaky skin.  Says her doctor wanted her to come in to check for blood clot.

## 2019-01-20 ENCOUNTER — Other Ambulatory Visit: Payer: Self-pay

## 2019-01-20 ENCOUNTER — Other Ambulatory Visit: Payer: Self-pay | Admitting: Internal Medicine

## 2019-01-20 ENCOUNTER — Ambulatory Visit
Admission: RE | Admit: 2019-01-20 | Discharge: 2019-01-20 | Disposition: A | Discharge status: Home / Self Care | Payer: Medicare Other | Source: Ambulatory Visit | Attending: Internal Medicine | Admitting: Internal Medicine

## 2019-01-20 DIAGNOSIS — R102 Pelvic and perineal pain: Secondary | ICD-10-CM

## 2019-01-28 ENCOUNTER — Other Ambulatory Visit (INDEPENDENT_AMBULATORY_CARE_PROVIDER_SITE_OTHER): Payer: Self-pay | Admitting: Vascular Surgery

## 2019-01-28 ENCOUNTER — Ambulatory Visit
Admission: RE | Admit: 2019-01-28 | Discharge: 2019-01-28 | Disposition: A | Discharge status: Home / Self Care | Payer: Medicare Other | Source: Ambulatory Visit | Attending: Family | Admitting: Family

## 2019-01-28 DIAGNOSIS — I6529 Occlusion and stenosis of unspecified carotid artery: Secondary | ICD-10-CM

## 2019-01-28 DIAGNOSIS — M7989 Other specified soft tissue disorders: Secondary | ICD-10-CM

## 2019-01-28 DIAGNOSIS — Z1231 Encounter for screening mammogram for malignant neoplasm of breast: Secondary | ICD-10-CM | POA: Diagnosis present

## 2019-01-30 ENCOUNTER — Other Ambulatory Visit (INDEPENDENT_AMBULATORY_CARE_PROVIDER_SITE_OTHER): Payer: Self-pay | Admitting: Vascular Surgery

## 2019-01-30 DIAGNOSIS — L03116 Cellulitis of left lower limb: Secondary | ICD-10-CM

## 2019-01-30 DIAGNOSIS — M7989 Other specified soft tissue disorders: Secondary | ICD-10-CM

## 2019-01-31 ENCOUNTER — Ambulatory Visit (INDEPENDENT_AMBULATORY_CARE_PROVIDER_SITE_OTHER): Payer: Medicare Other

## 2019-01-31 ENCOUNTER — Other Ambulatory Visit: Payer: Self-pay

## 2019-01-31 ENCOUNTER — Encounter (INDEPENDENT_AMBULATORY_CARE_PROVIDER_SITE_OTHER): Payer: Self-pay

## 2019-01-31 ENCOUNTER — Encounter (INDEPENDENT_AMBULATORY_CARE_PROVIDER_SITE_OTHER): Payer: Self-pay | Admitting: Nurse Practitioner

## 2019-01-31 ENCOUNTER — Ambulatory Visit (INDEPENDENT_AMBULATORY_CARE_PROVIDER_SITE_OTHER): Payer: Medicare Other | Admitting: Nurse Practitioner

## 2019-01-31 VITALS — BP 163/92 | HR 76 | Resp 12 | Ht 66.0 in | Wt 279.0 lb

## 2019-01-31 DIAGNOSIS — M7989 Other specified soft tissue disorders: Secondary | ICD-10-CM

## 2019-01-31 DIAGNOSIS — R600 Localized edema: Secondary | ICD-10-CM

## 2019-01-31 DIAGNOSIS — I10 Essential (primary) hypertension: Secondary | ICD-10-CM

## 2019-01-31 DIAGNOSIS — E785 Hyperlipidemia, unspecified: Secondary | ICD-10-CM | POA: Diagnosis not present

## 2019-01-31 DIAGNOSIS — I739 Peripheral vascular disease, unspecified: Secondary | ICD-10-CM | POA: Diagnosis not present

## 2019-01-31 DIAGNOSIS — L03116 Cellulitis of left lower limb: Secondary | ICD-10-CM

## 2019-02-17 ENCOUNTER — Encounter (INDEPENDENT_AMBULATORY_CARE_PROVIDER_SITE_OTHER): Payer: Self-pay | Admitting: Nurse Practitioner

## 2019-02-17 NOTE — Progress Notes (Signed)
SUBJECTIVE:  Patient ID: Sarah Medina, female    DOB: Jul 09, 1965, 53 y.o.   MRN: [id number] Chief Complaint  Patient presents with  . Follow-up    HPI  Sarah Medina is a 53 y.o. female Patient is seen for evaluation of leg pain and leg swelling. The patient first noticed the swelling remotely. The swelling is associated with pain and discoloration. The pain and swelling worsens with prolonged dependency and improves with elevation. The pain is unrelated to activity.  The patient notes that in the morning the legs are significantly improved but they steadily worsened throughout the course of the day. The patient also notes a steady worsening of the discoloration in the ankle and shin area.   The patient denies claudication symptoms.  The patient denies symptoms consistent with rest pain.  The patient denies and extensive history of DJD and LS spine disease.  The patient has no had any past angiography, interventions or vascular surgery.  Elevation makes the leg symptoms better, dependency makes them much worse. There is no history of ulcerations. The patient denies any recent changes in medications.  The patient has not been wearing graduated compression.  The patient denies a history of DVT or PE. There is no prior history of phlebitis. There is no history of primary lymphedema.  No history of malignancies. No history of trauma or groin or pelvic surgery. There is no history of radiation treatment to the groin or pelvis  The patient denies amaurosis fugax or recent TIA symptoms. There are no recent neurological changes noted. The patient denies recent episodes of angina or shortness of breath  The patient has evidence of reflux in the CFV bilaterally and the SFJ in the right leg and Femoral vein of the left.    The patient had a an abi of 1.17 on the right and 1.22 on the left with triphasic waveforms bilaterally.  The right toe had a pressure of 59 with dampened  waveforms.  Past Medical History:  Diagnosis Date  . Arthritis    hips, right leg  . CAD in native artery, RCA, LAD and LCX 07/23/2015  . Carotid artery occlusion   . H/O myocardial infarction less than 8 weeks- STEMI inf wall 07/20/15- stent to RCA 07/20/15   inf wall STEMI with DES to RCA  . Hypercholesteremia   . Hypertension   . Periorbital edema    treated with steroids  . Seizures ([place])    as teenager, ages [age].  none since  . STEMI (ST elevation myocardial infarction) ([place]) 07/23/15    Past Surgical History:  Procedure Laterality Date  . CARDIAC CATHETERIZATION N/A 07/20/2015   Procedure: Left Heart Cath and Coronary Angiography;  Surgeon: Isaias Cowman, MD;  Location: [place] [facility];  Service: Cardiovascular;  Laterality: N/A;  . CARDIAC CATHETERIZATION N/A 07/20/2015   Procedure: Coronary Stent Intervention;  Surgeon: Isaias Cowman, MD;  Location: [place] [facility];  Service: Cardiovascular;  Laterality: N/A;  . CARDIAC CATHETERIZATION N/A 07/23/2015   Procedure: Left Heart Cath and Coronary Angiography;  Surgeon: Lorretta Harp, MD;  Location: [place] [facility];  Service: Cardiovascular;  Laterality: N/A;  . CARDIAC CATHETERIZATION N/A 07/23/2015   Procedure: Coronary Balloon Angioplasty;  Surgeon: Lorretta Harp, MD;  Location: [place] [facility];  Service: Cardiovascular;  Laterality: N/A;  . CARDIAC CATHETERIZATION N/A 07/12/2016   Procedure: Left Heart Cath;  Surgeon: Teodoro Spray, MD;  Location: [place] [facility];  Service:  Cardiovascular;  Laterality: N/A;  . CARDIAC CATHETERIZATION N/A 07/12/2016   Procedure: Left Heart Cath and Coronary Angiography;  Surgeon: Isaias Cowman, MD;  Location: [place] [facility];  Service: Cardiovascular;  Laterality: N/A;  . CARDIAC CATHETERIZATION N/A 07/12/2016   Procedure: Intravascular Pressure Wire/FFR Study;  Surgeon: Isaias Cowman, MD;  Location: [place] [facility];  Service: Cardiovascular;   Laterality: N/A;  . CARDIAC CATHETERIZATION N/A 07/12/2016   Procedure: Coronary Stent Intervention;  Surgeon: Isaias Cowman, MD;  Location: [place] [facility];  Service: Cardiovascular;  Laterality: N/A;  . KNEE SURGERY Right 15 years ago    Social History   Socioeconomic History  . Marital status: Married    Spouse name: Not on file  . Number of children: Not on file  . Years of education: Not on file  . Highest education level: Not on file  Occupational History  . Not on file  Social Needs  . Financial resource strain: Not on file  . Food insecurity    Worry: Not on file    Inability: Not on file  . Transportation needs    Medical: Not on file    Non-medical: Not on file  Tobacco Use  . Smoking status: Never Smoker  . Smokeless tobacco: Former Systems developer    Types: Snuff  Substance and Sexual Activity  . Alcohol use: No  . Drug use: Never  . Sexual activity: Not on file  Lifestyle  . Physical activity    Days per week: Not on file    Minutes per session: Not on file  . Stress: Not on file  Relationships  . Social Herbalist on phone: Not on file    Gets together: Not on file    Attends religious service: Not on file    Active member of club or organization: Not on file    Attends meetings of clubs or organizations: Not on file    Relationship status: Not on file  . Intimate partner violence    Fear of current or ex partner: Not on file    Emotionally abused: Not on file    Physically abused: Not on file    Forced sexual activity: Not on file  Other Topics Concern  . Not on file  Social History Narrative  . Not on file    Family History  Problem Relation Age of Onset  . Breast cancer Sister 46    Allergies  Allergen Reactions  . Enalapril Swelling    Swelling face     Review of Systems   Review of Systems: Negative Unless Checked Constitutional: [] Weight loss  [] Fever  [] Chills Cardiac: [] Chest pain   []  Atrial Fibrillation   [] Palpitations   [] Shortness of breath when laying flat   [] Shortness of breath with exertion. [] Shortness of breath at rest Vascular:  [] Pain in legs with walking   [] Pain in legs with standing [] Pain in legs when laying flat   [] Claudication    [] Pain in feet when laying flat    [] History of DVT   [] Phlebitis   [x] Swelling in legs   [] Varicose veins   [] Non-healing ulcers Pulmonary:   [] Uses home oxygen   [] Productive cough   [] Hemoptysis   [] Wheeze  [] COPD   [] Asthma Neurologic:  [] Dizziness   [] Seizures  [] Blackouts [] History of stroke   [] History of TIA  [] Aphasia   [] Temporary Blindness   [] Weakness or numbness in arm   [] Weakness or numbness in leg Musculoskeletal:   []   Joint swelling   [] Joint pain   [] Low back pain  []  History of Knee Replacement [x] Arthritis [] back Surgeries  []  Spinal Stenosis    Hematologic:  [] Easy bruising  [] Easy bleeding   [] Hypercoagulable state   [] Anemic Gastrointestinal:  [] Diarrhea   [] Vomiting  [] Gastroesophageal reflux/heartburn   [] Difficulty swallowing. [] Abdominal pain Genitourinary:  [] Chronic kidney disease   [] Difficult urination  [] Anuric   [] Blood in urine [] Frequent urination  [] Burning with urination   [] Hematuria Skin:  [] Rashes   [] Ulcers [] Wounds Psychological:  [] History of anxiety   []  History of major depression  []  Memory Difficulties      OBJECTIVE:   Physical Exam  BP (!) 163/92 (BP Location: Left Wrist, Patient Position: Sitting, Cuff Size: Normal)   Pulse 76   Resp 12   Ht 5\' 6"  (1.676 m)   Wt 279 lb (126.6 kg)   LMP 05/19/2015 (LMP Unknown)   BMI 45.03 kg/m   Gen: WD/WN, NAD Head: [place]/AT, No temporalis wasting.  Ear/Nose/Throat: Hearing grossly intact, nares w/o erythema or drainage Eyes: PER, EOMI, sclera nonicteric.  Neck: Supple, no masses.  No JVD.  Pulmonary:  Good air movement, no use of accessory muscles.  Cardiac: RRR Vascular:  Vessel Right Left  Radial Palpable Palpable  Dorsalis Pedis Palpable Palpable   Posterior Tibial Palpable Palpable   Gastrointestinal: soft, non-distended. No guarding/no peritoneal signs.  Musculoskeletal: M/S 5/5 throughout.  No deformity or atrophy.  Neurologic: Pain and light touch intact in extremities.  Symmetrical.  Speech is fluent. Motor exam as listed above. Psychiatric: Judgment intact, Mood & affect appropriate for pt's clinical situation. Dermatologic: No Venous rashes. No Ulcers Noted.  No changes consistent with cellulitis. Lymph : No Cervical lymphadenopathy, no lichenification or skin changes of chronic lymphedema.       ASSESSMENT AND PLAN:  1. Leg swelling No surgery or intervention at this point in time.  I have reviewed my discussion with the patient regarding venous insufficiency and why it causes symptoms. I have discussed with the patient the chronic skin changes that accompany venous insufficiency and the long term sequela such as ulceration. Patient will contnue wearing graduated compression stockings on a daily basis, as this has provided excellent control of his edema. The patient will put the stockings on first thing in the morning and removing them in the evening. The patient is reminded not to sleep in the stockings.  In addition, behavioral modification including elevation during the day will be initiated. Exercise is strongly encouraged.   Given the patient's good control and lack of any problems regarding the venous insufficiency and lymphedema a lymph pump in not need at this time.  The patient will follow up with me PRN should anything change.  The patient voices agreement with this plan.   2. Hyperlipidemia, unspecified hyperlipidemia type Continue statin as ordered and reviewed, no changes at this time   3. Essential hypertension Continue antihypertensive medications as already ordered, these medications have been reviewed and there are no changes at this time.   4. PAD (peripheral artery disease) (HCC)  Recommend:  The  patient has evidence of atherosclerosis of the lower extremities with claudication.  The patient does not voice lifestyle limiting changes at this point in time.  Noninvasive studies do not suggest clinically significant change.  No invasive studies, angiography or surgery at this time The patient should continue walking and begin a more formal exercise program.  The patient should continue antiplatelet therapy and aggressive treatment of the  lipid abnormalities  No changes in the patient's medications at this time  The patient should continue wearing graduated compression socks 10-15 mmHg strength to control the mild edema.   - VAS [place] ABI WITH/WO TBI; Future   Current Outpatient Medications on File Prior to Visit  Medication Sig Dispense Refill  . aspirin EC 81 MG tablet Take 81 mg by mouth daily.    Marland Kitchen atorvastatin (LIPITOR) 80 MG tablet Take 1 tablet (80 mg total) by mouth daily at 6 PM. (Patient taking differently: Take 40 mg by mouth daily at 6 PM. ) 30 tablet 11  . cholecalciferol (VITAMIN D) 400 units TABS tablet Take 50,000 Units by mouth every Monday.     . clopidogrel (PLAVIX) 75 MG tablet Take 1 tablet (75 mg total) by mouth daily. 30 tablet 3  . fluticasone (FLONASE) 50 MCG/ACT nasal spray Place 1 spray into both nostrils daily.    . furosemide (LASIX) 20 MG tablet     . loratadine (CLARITIN) 10 MG tablet Take 1 tablet (10 mg total) by mouth daily.    Marland Kitchen losartan (COZAAR) 50 MG tablet Take 50 mg by mouth daily.    . metoprolol tartrate (LOPRESSOR) 25 MG tablet Take 0.5 tablets (12.5 mg total) by mouth 2 (two) times daily. 180 tablet 0  . nitroGLYCERIN (NITROSTAT) 0.4 MG SL tablet Place 1 tablet (0.4 mg total) under the tongue every 5 (five) minutes x 3 doses as needed for chest pain. 25 tablet 12  . hydrochlorothiazide (HYDRODIURIL) 12.5 MG tablet Take 12.5 mg by mouth daily.    . medroxyPROGESTERone (PROVERA) 10 MG tablet Take 1 tablet (10 mg total) by mouth daily. (Patient not  taking: Reported on 01/31/2019)     No current facility-administered medications on file prior to visit.     There are no Patient Instructions on file for this visit. No follow-ups on file.   Kris Hartmann, NP  This note was completed with Sales executive.  Any errors are purely unintentional.

## 2019-02-19 DIAGNOSIS — R609 Edema, unspecified: Secondary | ICD-10-CM | POA: Insufficient documentation

## 2019-02-19 DIAGNOSIS — R600 Localized edema: Secondary | ICD-10-CM | POA: Insufficient documentation

## 2019-02-20 ENCOUNTER — Ambulatory Visit: Payer: Medicare Other | Admitting: Obstetrics and Gynecology

## 2019-02-26 ENCOUNTER — Other Ambulatory Visit (HOSPITAL_COMMUNITY)
Admission: RE | Admit: 2019-02-26 | Discharge: 2019-02-26 | Disposition: A | Discharge status: Home / Self Care | Payer: Medicare Other | Source: Ambulatory Visit | Attending: Obstetrics and Gynecology | Admitting: Obstetrics and Gynecology

## 2019-02-26 ENCOUNTER — Ambulatory Visit (INDEPENDENT_AMBULATORY_CARE_PROVIDER_SITE_OTHER): Payer: Medicare Other | Admitting: Obstetrics and Gynecology

## 2019-02-26 ENCOUNTER — Other Ambulatory Visit: Payer: Self-pay

## 2019-02-26 ENCOUNTER — Encounter: Payer: Self-pay | Admitting: Obstetrics and Gynecology

## 2019-02-26 VITALS — BP 119/69 | Ht 66.0 in | Wt 278.0 lb

## 2019-02-26 DIAGNOSIS — Z113 Encounter for screening for infections with a predominantly sexual mode of transmission: Secondary | ICD-10-CM | POA: Diagnosis present

## 2019-02-26 DIAGNOSIS — N950 Postmenopausal bleeding: Secondary | ICD-10-CM | POA: Diagnosis not present

## 2019-02-26 DIAGNOSIS — Z780 Asymptomatic menopausal state: Secondary | ICD-10-CM | POA: Diagnosis not present

## 2019-02-26 DIAGNOSIS — Z124 Encounter for screening for malignant neoplasm of cervix: Secondary | ICD-10-CM

## 2019-02-26 DIAGNOSIS — Z1151 Encounter for screening for human papillomavirus (HPV): Secondary | ICD-10-CM | POA: Diagnosis not present

## 2019-02-26 NOTE — Progress Notes (Signed)
Gynecology H&P  Chief Complaint:  Chief Complaint  Patient presents with  . postmenopausal bleeding    bleeding in June Referred by Dr. Chancy Milroy    History of Present Illness: Patient is a 53 y.o. G1P0 seen in consultation at the request of Dr. Smiley Houseman at [facility] for the evaluation of postmenopausal bleeding. The patient states she has had a single episode(s) of bleeding in June 2020.  The most recent episode occurred  2  month(s) ago.  The bleeding has been flow consistent with normal menstrual cycle, duration 2 days . She describes the blood as Bright red in appearance.  She has had no additional complaints. She deniestrauma or other inciting event, bloating, cramping, early satiety and abdominal pain. She does not have a history of abnormal pap smears.  Her last pap smear was 2017 years ago and was read as NIL and HR HPV negative .  The patient is currently sexually active with no postcoital bleeding noted There are no other aggravating factors reported. There are no alleviating factors reported. The patient's past medical history is notable for plavix use.    She hashad prior work up for postmenopausal bleeding.  She has prior pelvic imaging.  She declines transvaginal imaging and the endometrial stripe was note well visualized and measured at approximately 79mm.    Review of Systems: 10 point review of systems negative unless otherwise noted in HPI  Past Medical History:  Past Medical History:  Diagnosis Date  . Arthritis    hips, right leg  . CAD in native artery, RCA, LAD and LCX 07/23/2015  . Carotid artery occlusion   . H/O myocardial infarction less than 8 weeks- STEMI inf wall 07/20/15- stent to RCA 07/20/15   inf wall STEMI with DES to RCA  . Hypercholesteremia   . Hypertension   . Periorbital edema    treated with steroids  . Seizures ([place])    as teenager, ages [age].  none since  . STEMI (ST elevation myocardial infarction) ([place]) 07/23/15    Past  Surgical History:  Past Surgical History:  Procedure Laterality Date  . CARDIAC CATHETERIZATION N/A 07/20/2015   Procedure: Left Heart Cath and Coronary Angiography;  Surgeon: Isaias Cowman, MD;  Location: [facility];  Service: Cardiovascular;  Laterality: N/A;  . CARDIAC CATHETERIZATION N/A 07/20/2015   Procedure: Coronary Stent Intervention;  Surgeon: Isaias Cowman, MD;  Location: [facility];  Service: Cardiovascular;  Laterality: N/A;  . CARDIAC CATHETERIZATION N/A 07/23/2015   Procedure: Left Heart Cath and Coronary Angiography;  Surgeon: Lorretta Harp, MD;  Location: [facility];  Service: Cardiovascular;  Laterality: N/A;  . CARDIAC CATHETERIZATION N/A 07/23/2015   Procedure: Coronary Balloon Angioplasty;  Surgeon: Lorretta Harp, MD;  Location: [facility];  Service: Cardiovascular;  Laterality: N/A;  . CARDIAC CATHETERIZATION N/A 07/12/2016   Procedure: Left Heart Cath;  Surgeon: Teodoro Spray, MD;  Location: [facility];  Service: Cardiovascular;  Laterality: N/A;  . CARDIAC CATHETERIZATION N/A 07/12/2016   Procedure: Left Heart Cath and Coronary Angiography;  Surgeon: Isaias Cowman, MD;  Location: [facility];  Service: Cardiovascular;  Laterality: N/A;  . CARDIAC CATHETERIZATION N/A 07/12/2016   Procedure: Intravascular Pressure Wire/FFR Study;  Surgeon: Isaias Cowman, MD;  Location: [facility];  Service: Cardiovascular;  Laterality: N/A;  . CARDIAC CATHETERIZATION N/A 07/12/2016   Procedure: Coronary Stent Intervention;  Surgeon: Isaias Cowman, MD;  Location: [facility]  INVASIVE [facility];  Service: Cardiovascular;  Laterality: N/A;  . KNEE SURGERY Right 15 years ago    Family History:  Family History  Problem Relation Age of Onset  . Breast cancer Sister 47    Social History:  Social History   Socioeconomic History  . Marital status: Married    Spouse name: Not on file  . Number of  children: Not on file  . Years of education: Not on file  . Highest education level: Not on file  Occupational History  . Not on file  Social Needs  . Financial resource strain: Not on file  . Food insecurity    Worry: Not on file    Inability: Not on file  . Transportation needs    Medical: Not on file    Non-medical: Not on file  Tobacco Use  . Smoking status: Never Smoker  . Smokeless tobacco: Former Systems developer    Types: Snuff  Substance and Sexual Activity  . Alcohol use: No  . Drug use: Never  . Sexual activity: Yes    Birth control/protection: None  Lifestyle  . Physical activity    Days per week: Not on file    Minutes per session: Not on file  . Stress: Not on file  Relationships  . Social Herbalist on phone: Not on file    Gets together: Not on file    Attends religious service: Not on file    Active member of club or organization: Not on file    Attends meetings of clubs or organizations: Not on file    Relationship status: Not on file  . Intimate partner violence    Fear of current or ex partner: Not on file    Emotionally abused: Not on file    Physically abused: Not on file    Forced sexual activity: Not on file  Other Topics Concern  . Not on file  Social History Narrative  . Not on file    Allergies:  Allergies  Allergen Reactions  . Enalapril Swelling    Swelling face    Medications: Prior to Admission medications   Medication Sig Start Date End Date Taking? Authorizing Provider  aspirin EC 81 MG tablet Take 81 mg by mouth daily.    [provider]  atorvastatin (LIPITOR) 80 MG tablet Take 1 tablet (80 mg total) by mouth daily at 6 PM. Patient taking differently: Take 40 mg by mouth daily at 6 PM.  07/27/15   Eileen Stanford, PA-C  cholecalciferol (VITAMIN D) 400 units TABS tablet Take 50,000 Units by mouth every Monday.     [provider]  clopidogrel (PLAVIX) 75 MG tablet Take 1 tablet (75 mg total) by mouth  daily. 01/26/16   Tawni Millers, MD  fluticasone (FLONASE) 50 MCG/ACT nasal spray Place 1 spray into both nostrils daily. 12/29/18   Henreitta Leber, MD  furosemide (LASIX) 20 MG tablet  01/02/19   [provider]  hydrochlorothiazide (HYDRODIURIL) 12.5 MG tablet Take 12.5 mg by mouth daily.    [provider]  loratadine (CLARITIN) 10 MG tablet Take 1 tablet (10 mg total) by mouth daily. 12/29/18   Henreitta Leber, MD  losartan (COZAAR) 50 MG tablet Take 50 mg by mouth daily.    [provider]  medroxyPROGESTERone (PROVERA) 10 MG tablet Take 1 tablet (10 mg total) by mouth daily. Patient not taking: Reported on 01/31/2019 12/29/18   Henreitta Leber, MD  metoprolol tartrate (LOPRESSOR) 25 MG tablet Take 0.5 tablets (12.5 mg total) by mouth 2 (two) times daily. 07/13/16   Hillary Bow, MD  nitroGLYCERIN (NITROSTAT) 0.4 MG SL tablet Place 1 tablet (0.4 mg total) under the tongue every 5 (five) minutes x 3 doses as needed for chest pain. 07/27/15   Eileen Stanford, PA-C    Physical Exam Vitals: Blood pressure 119/69, height 5\' 6"  (1.676 m), weight 278 lb (126.1 kg), last menstrual period 05/19/2015.  General: NAD, well nourished, appears stated age 20: normocephalic, anicteric Pulmonary: No increased work of breathing Abdomen: soft, non-tender, non-distended.  Umbilicus without lesions.  No hepatomegaly, splenomegaly or masses palpable. No evidence of hernia  Genitourinary:  External: Normal external female genitalia.  Normal urethral meatus, normal  Bartholin's and Skene's glands.    Vagina: Normal vaginal mucosa, no evidence of prolapse.    Cervix: Grossly normal in appearance, no bleeding  Uterus: Non-enlarged, mobile, normal contour.  No CMT  Adnexa: ovaries non-enlarged, no adnexal masses  Rectal: deferred Extremities: no edema, erythema, or tenderness Neurologic: Grossly intact Psychiatric: mood appropriate, affect full  Assessment: 53 y.o. G1P0  presenting for evaluation of postmenopausal bleeding  Plan: Problem List Items Addressed This Visit    None    Visit Diagnoses    Postmenopausal bleeding    -  Primary   Relevant Orders   US Transvaginal Non-OB   Screening for malignant neoplasm of cervix       Relevant Orders   Cytology - PAP   Routine screening for STI (sexually transmitted infection)       Relevant Orders   Cytology - PAP      1) We discussed that menopause is a clinical diagnosis made after 12 months of amenorrhea.  The average age of menopause in the  [place] population is 15 but there may be significant variation.  Any bleeding that happens after a 12 month period of amenorrhea warrants further work.  Possible etiologies of postmenopausal bleeding were discussed with the patient today.  These may range from benign etiologies such as urethral prolapse and atrophy, to indeterminate lesions such as submucosal fibroids or polyps which would require resection to accurately evaluate. The role of unopposed estrogen in the development of  dndometrial hyperplasia or carcinoma is discussed.  The risk of endometrial hyperplasia is linearly correlated with increasing BMI given the production of estrone by adipose tissue.  Work up will be include transvaginal ultrasound to assess the thickness of the endometrial lining as well as to assess for focal uterine lesions.  Negative ultrasound evaluation, defined as the absence of focal lesions and endometrial stripe of <87mm, effectively rules out carcinoma and confirms atrophy as the most likely etiology.  Should focal lesions be present these generally require hysteroscopic resection.  Should lining be greater >81mm endometrial biopsy is warranted to rule out hyperplasia or frank endometrial cancer.  Continued episodes of bleeding despite negative ultrasound also warrant endometrial sampling.  As the cervical pathology may also be implicated in postmenopausal bleeding prior cervical  cytology was reviewed and repeated if required per ASCCP guidelines.   2) Evaluation by pelvic ultrasound scheduled, with follow up after [place]. EMB discussed and may be performed as well. Pros and cons of these modalities of testing discussed.   3) Return in about 1 week (around 03/05/2019) for 1-2 week TVUS and follow up.   Malachy Mood, MD, [facility], [facility] 02/26/2019, 11:17 AM

## 2019-02-28 LAB — CYTOLOGY - PAP
Chlamydia: NEGATIVE
Diagnosis: NEGATIVE
HPV: NOT DETECTED
Neisseria Gonorrhea: NEGATIVE
Trichomonas: NEGATIVE

## 2019-03-07 ENCOUNTER — Ambulatory Visit: Payer: Medicare Other | Admitting: Obstetrics and Gynecology

## 2019-03-07 ENCOUNTER — Other Ambulatory Visit: Payer: Medicare Other

## 2019-03-08 ENCOUNTER — Emergency Department: Payer: Medicare Other

## 2019-03-08 ENCOUNTER — Observation Stay
Admission: EM | Admit: 2019-03-08 | Discharge: 2019-03-10 | Disposition: A | Discharge status: Home / Self Care | Payer: Medicare Other | Attending: Internal Medicine | Admitting: Internal Medicine

## 2019-03-08 ENCOUNTER — Encounter: Payer: Self-pay | Admitting: Emergency Medicine

## 2019-03-08 ENCOUNTER — Other Ambulatory Visit: Payer: Self-pay

## 2019-03-08 DIAGNOSIS — I25119 Atherosclerotic heart disease of native coronary artery with unspecified angina pectoris: Secondary | ICD-10-CM | POA: Diagnosis present

## 2019-03-08 DIAGNOSIS — R1011 Right upper quadrant pain: Secondary | ICD-10-CM

## 2019-03-08 DIAGNOSIS — I252 Old myocardial infarction: Secondary | ICD-10-CM | POA: Insufficient documentation

## 2019-03-08 DIAGNOSIS — E7800 Pure hypercholesterolemia, unspecified: Secondary | ICD-10-CM | POA: Diagnosis not present

## 2019-03-08 DIAGNOSIS — L03116 Cellulitis of left lower limb: Secondary | ICD-10-CM | POA: Diagnosis present

## 2019-03-08 DIAGNOSIS — Z20828 Contact with and (suspected) exposure to other viral communicable diseases: Secondary | ICD-10-CM | POA: Insufficient documentation

## 2019-03-08 DIAGNOSIS — Z955 Presence of coronary angioplasty implant and graft: Secondary | ICD-10-CM | POA: Diagnosis not present

## 2019-03-08 DIAGNOSIS — E785 Hyperlipidemia, unspecified: Secondary | ICD-10-CM | POA: Diagnosis not present

## 2019-03-08 DIAGNOSIS — R300 Dysuria: Secondary | ICD-10-CM | POA: Diagnosis present

## 2019-03-08 DIAGNOSIS — N390 Urinary tract infection, site not specified: Secondary | ICD-10-CM | POA: Diagnosis not present

## 2019-03-08 DIAGNOSIS — I6529 Occlusion and stenosis of unspecified carotid artery: Secondary | ICD-10-CM | POA: Diagnosis not present

## 2019-03-08 DIAGNOSIS — Z7902 Long term (current) use of antithrombotics/antiplatelets: Secondary | ICD-10-CM | POA: Insufficient documentation

## 2019-03-08 DIAGNOSIS — Z7982 Long term (current) use of aspirin: Secondary | ICD-10-CM | POA: Insufficient documentation

## 2019-03-08 DIAGNOSIS — I10 Essential (primary) hypertension: Secondary | ICD-10-CM | POA: Diagnosis present

## 2019-03-08 DIAGNOSIS — Z793 Long term (current) use of hormonal contraceptives: Secondary | ICD-10-CM | POA: Diagnosis not present

## 2019-03-08 DIAGNOSIS — M1990 Unspecified osteoarthritis, unspecified site: Secondary | ICD-10-CM | POA: Insufficient documentation

## 2019-03-08 DIAGNOSIS — I2510 Atherosclerotic heart disease of native coronary artery without angina pectoris: Secondary | ICD-10-CM | POA: Diagnosis not present

## 2019-03-08 DIAGNOSIS — Z79899 Other long term (current) drug therapy: Secondary | ICD-10-CM | POA: Diagnosis not present

## 2019-03-08 LAB — URINALYSIS, COMPLETE (UACMP) WITH MICROSCOPIC
Bilirubin Urine: NEGATIVE
Glucose, UA: NEGATIVE mg/dL
Hgb urine dipstick: NEGATIVE
Ketones, ur: 5 mg/dL — AB
Nitrite: NEGATIVE
Protein, ur: 100 mg/dL — AB
Specific Gravity, Urine: 1.03 (ref 1.005–1.030)
pH: 5 (ref 5.0–8.0)

## 2019-03-08 LAB — LIPASE, BLOOD: Lipase: 25 U/L (ref 11–51)

## 2019-03-08 LAB — CBC
HCT: 36.6 % (ref 36.0–46.0)
Hemoglobin: 11.4 g/dL — ABNORMAL LOW (ref 12.0–15.0)
MCH: 26.5 pg (ref 26.0–34.0)
MCHC: 31.1 g/dL (ref 30.0–36.0)
MCV: 85.1 fL (ref 80.0–100.0)
Platelets: 265 10*3/uL (ref 150–400)
RBC: 4.3 MIL/uL (ref 3.87–5.11)
RDW: 16.4 % — ABNORMAL HIGH (ref 11.5–15.5)
WBC: 17.9 10*3/uL — ABNORMAL HIGH (ref 4.0–10.5)
nRBC: 0 % (ref 0.0–0.2)

## 2019-03-08 LAB — BASIC METABOLIC PANEL
Anion gap: 11 (ref 5–15)
BUN: 17 mg/dL (ref 6–20)
CO2: 21 mmol/L — ABNORMAL LOW (ref 22–32)
Calcium: 8.7 mg/dL — ABNORMAL LOW (ref 8.9–10.3)
Chloride: 105 mmol/L (ref 98–111)
Creatinine, Ser: 1.33 mg/dL — ABNORMAL HIGH (ref 0.44–1.00)
GFR calc Af Amer: 52 mL/min — ABNORMAL LOW (ref >60)
GFR calc non Af Amer: 45 mL/min — ABNORMAL LOW (ref >60)
Glucose, Bld: 114 mg/dL — ABNORMAL HIGH (ref 70–99)
Potassium: 3.3 mmol/L — ABNORMAL LOW (ref 3.5–5.1)
Sodium: 137 mmol/L (ref 135–145)

## 2019-03-08 LAB — HEPATIC FUNCTION PANEL
ALT: 18 U/L (ref 0–44)
AST: 28 U/L (ref 15–41)
Albumin: 3.4 g/dL — ABNORMAL LOW (ref 3.5–5.0)
Alkaline Phosphatase: 70 U/L (ref 38–126)
Bilirubin, Direct: 0.2 mg/dL (ref 0.0–0.2)
Indirect Bilirubin: 0.9 mg/dL (ref 0.3–0.9)
Total Bilirubin: 1.1 mg/dL (ref 0.3–1.2)
Total Protein: 7.7 g/dL (ref 6.5–8.1)

## 2019-03-08 LAB — SARS CORONAVIRUS 2 BY RT PCR (HOSPITAL ORDER, PERFORMED IN ~~LOC~~ HOSPITAL LAB): SARS Coronavirus 2: NEGATIVE

## 2019-03-08 MED ORDER — SODIUM CHLORIDE 0.9 % IV BOLUS
500.0000 mL | Freq: Once | INTRAVENOUS | Status: AC
  Administered 2019-03-08: 21:00:00 500 mL via INTRAVENOUS

## 2019-03-08 MED ORDER — IOHEXOL 300 MG/ML  SOLN
125.0000 mL | Freq: Once | INTRAMUSCULAR | Status: AC | PRN
  Administered 2019-03-08: 23:00:00 150 mL via INTRAVENOUS

## 2019-03-08 MED ORDER — SODIUM CHLORIDE 0.9% FLUSH: 3.0000 mL | Freq: Once | INTRAVENOUS | Status: DC

## 2019-03-08 MED ORDER — LIDOCAINE HCL (PF) 1 % IJ SOLN: 5.0000 mL | Freq: Once | INTRAMUSCULAR | Status: DC

## 2019-03-08 MED ORDER — SODIUM CHLORIDE 0.9 % IV SOLN
1.0000 g | Freq: Once | INTRAVENOUS | Status: AC
  Administered 2019-03-09: 1 g via INTRAVENOUS
  Filled 2019-03-08: qty 10

## 2019-03-08 NOTE — ED Notes (Addendum)
Pts upper arm swollen and painful around IV site, fluids stopped at this time. Ariel RN aware.

## 2019-03-08 NOTE — ED Notes (Signed)
Patient transported to CT 

## 2019-03-08 NOTE — ED Notes (Signed)
2 unsuccessful attempts at IV establishment

## 2019-03-08 NOTE — ED Notes (Addendum)
Pt states her head and her stomach have been hurting since yesterday- denies v/d and constipation- pt states she was warm to touch yesterday and has been taking tylenol- last does of tylenol was last night

## 2019-03-08 NOTE — ED Notes (Signed)
Dr Quentin Cornwall at bedside to attempt ultrasound IV

## 2019-03-08 NOTE — ED Notes (Addendum)
Dr Quentin Cornwall aware of inability to get IV access

## 2019-03-08 NOTE — ED Notes (Signed)
IV team at bedside 

## 2019-03-08 NOTE — ED Provider Notes (Signed)
[facility] Emergency Department Provider Note    First MD Initiated Contact with Patient 03/08/19 [phone]     (approximate)  I have reviewed the triage vital signs and the nursing notes.   HISTORY  Chief Complaint Weakness    HPI Sarah Medina is a 53 y.o. female presents the ER for fever at home for the past 24hours.  Complaining of weakness difficulty walking as well as abdominal pain particularly right upper quadrant abdominal pain associated with some nausea.  Does endorse increased urinary frequency and burning when she urinates.  Have also noticed a very foul odor.  No recent antibiotics.  No recent sick contacts.    Past Medical History:  Diagnosis Date  . Arthritis    hips, right leg  . CAD in native artery, RCA, LAD and LCX 07/23/2015  . Carotid artery occlusion   . H/O myocardial infarction less than 8 weeks- STEMI inf wall 07/20/15- stent to RCA 07/20/15   inf wall STEMI with DES to RCA  . Hypercholesteremia   . Hypertension   . Periorbital edema    treated with steroids  . Seizures ([place])    as teenager, ages [age].  none since  . STEMI (ST elevation myocardial infarction) ([place]) 07/23/15   Family History  Problem Relation Age of Onset  . Breast cancer Sister 37   Past Surgical History:  Procedure Laterality Date  . CARDIAC CATHETERIZATION N/A 07/20/2015   Procedure: Left Heart Cath and Coronary Angiography;  Surgeon: Isaias Cowman, MD;  Location: [facility];  Service: Cardiovascular;  Laterality: N/A;  . CARDIAC CATHETERIZATION N/A 07/20/2015   Procedure: Coronary Stent Intervention;  Surgeon: Isaias Cowman, MD;  Location: [facility];  Service: Cardiovascular;  Laterality: N/A;  . CARDIAC CATHETERIZATION N/A 07/23/2015   Procedure: Left Heart Cath and Coronary Angiography;  Surgeon: Lorretta Harp, MD;  Location: [facility];  Service: Cardiovascular;  Laterality: N/A;  . CARDIAC  CATHETERIZATION N/A 07/23/2015   Procedure: Coronary Balloon Angioplasty;  Surgeon: Lorretta Harp, MD;  Location: [facility];  Service: Cardiovascular;  Laterality: N/A;  . CARDIAC CATHETERIZATION N/A 07/12/2016   Procedure: Left Heart Cath;  Surgeon: Teodoro Spray, MD;  Location: [facility];  Service: Cardiovascular;  Laterality: N/A;  . CARDIAC CATHETERIZATION N/A 07/12/2016   Procedure: Left Heart Cath and Coronary Angiography;  Surgeon: Isaias Cowman, MD;  Location: [facility];  Service: Cardiovascular;  Laterality: N/A;  . CARDIAC CATHETERIZATION N/A 07/12/2016   Procedure: Intravascular Pressure Wire/FFR Study;  Surgeon: Isaias Cowman, MD;  Location: [facility] [facility];  Service: Cardiovascular;  Laterality: N/A;  . CARDIAC CATHETERIZATION N/A 07/12/2016   Procedure: Coronary Stent Intervention;  Surgeon: Isaias Cowman, MD;  Location: [facility];  Service: Cardiovascular;  Laterality: N/A;  . KNEE SURGERY Right 15 years ago   Patient Active Problem List   Diagnosis Date Noted  . Cellulitis of left lower extremity   . UTI (urinary tract infection)   . Leg swelling   . Positive D dimer   . Sepsis ([place]) 12/25/2018  . Chest pain, rule out acute myocardial infarction 07/11/2016  . Ischemic heart disease 10/20/2015  . Essential hypertension 10/20/2015  . Hyperlipidemia 10/20/2015  . Acute posthemorrhagic anemia   . H/O myocardial infarction less than 8 weeks- STEMI inf wall 07/20/15- stent to RCA 07/23/2015  . CAD in native artery, RCA, LAD and LCX 07/23/2015  .  Ventricular fibrillation ([place]) 07/23/2015  . ST elevation myocardial infarction (STEMI) of inferior wall ([place]) 07/20/2015  . Abnormal LFTs 12/23/2014  . Osteoarthritis of right knee 11/24/2014      Prior to Admission medications   Medication Sig Start Date End Date Taking? Authorizing Provider  aspirin EC 81 MG tablet Take 81 mg by mouth daily.   Yes [provider]  ergocalciferol (VITAMIN D2) 1.25 MG (50000 UT) capsule Take 50,000 Units by mouth every Monday.   Yes [provider]  ferrous sulfate 325 (65 FE) MG tablet Take 325 mg by mouth daily.   Yes [provider]  atorvastatin (LIPITOR) 80 MG tablet Take 1 tablet (80 mg total) by mouth daily at 6 PM. Patient taking differently: Take 40 mg by mouth daily at 6 PM.  07/27/15   Eileen Stanford, PA-C  clopidogrel (PLAVIX) 75 MG tablet Take 1 tablet (75 mg total) by mouth daily. 01/26/16   Tawni Millers, MD  fluticasone (FLONASE) 50 MCG/ACT nasal spray Place 1 spray into both nostrils daily. 12/29/18   Henreitta Leber, MD  furosemide (LASIX) 20 MG tablet  01/02/19   [provider]  hydrochlorothiazide (HYDRODIURIL) 12.5 MG tablet Take 12.5 mg by mouth daily.    [provider]  losartan (COZAAR) 50 MG tablet Take 50 mg by mouth daily.    [provider]  medroxyPROGESTERone (PROVERA) 10 MG tablet Take 1 tablet (10 mg total) by mouth daily. Patient not taking: Reported on 01/31/2019 12/29/18   Henreitta Leber, MD  metoprolol tartrate (LOPRESSOR) 25 MG tablet Take 0.5 tablets (12.5 mg total) by mouth 2 (two) times daily. 07/13/16   Hillary Bow, MD  nitroGLYCERIN (NITROSTAT) 0.4 MG SL tablet Place 1 tablet (0.4 mg total) under the tongue every 5 (five) minutes x 3 doses as needed for chest pain. 07/27/15   Eileen Stanford, PA-C    Allergies Enalapril    Social History Social History   Tobacco Use  . Smoking status: Never Smoker  . Smokeless tobacco: Former Systems developer    Types: Snuff  Substance Use Topics  . Alcohol use: No  . Drug use: Never    Review of Systems Patient denies headaches, rhinorrhea, blurry vision, numbness, shortness of breath, chest pain, edema, cough, abdominal pain, nausea, vomiting, diarrhea, dysuria, fevers, rashes or hallucinations unless otherwise stated above in HPI. ____________________________________________    PHYSICAL EXAM:  VITAL SIGNS: Vitals:   03/08/19 2205 03/08/19 2206  BP: 128/65   Pulse:  65  Resp:    Temp:    SpO2:  96%    Constitutional: Alert and oriented.  Eyes: Conjunctivae are normal.  Head: Atraumatic. Nose: No congestion/rhinnorhea. Mouth/Throat: Mucous membranes are moist.   Neck: No stridor. Painless ROM.  Cardiovascular: Normal rate, regular rhythm. Grossly normal heart sounds.  Good peripheral circulation. Respiratory: Normal respiratory effort.  No retractions. Lungs CTAB. Gastrointestinal: Soft obese with ttp in RUQ. No distention. No abdominal bruits. No CVA tenderness. Genitourinary:  Musculoskeletal:LLE is warm and ttp.  No crepitus.  Strong DP and PT.   No joint effusions. Neurologic:  Normal speech and language. No gross focal neurologic deficits are appreciated. No facial droop Skin:  Skin is warm, dry and intact. No rash noted. Psychiatric: Mood and affect are normal. Speech and behavior are normal.  ____________________________________________   LABS (all labs ordered are listed, but only abnormal results are displayed)  Results for orders placed or performed during the hospital encounter of  03/08/19 (from the past 24 hour(s))  Basic metabolic panel     Status: Abnormal   Collection Time: 03/08/19  1:50 PM  Result Value Ref Range   Sodium 137 135 - 145 mmol/L   Potassium 3.3 (L) 3.5 - 5.1 mmol/L   Chloride 105 98 - 111 mmol/L   CO2 21 (L) 22 - 32 mmol/L   Glucose, Bld 114 (H) 70 - 99 mg/dL   BUN 17 6 - 20 mg/dL   Creatinine, Ser 1.33 (H) 0.44 - 1.00 mg/dL   Calcium 8.7 (L) 8.9 - 10.3 mg/dL   GFR calc non Af Amer 45 (L) >60 mL/min   GFR calc Af Amer 52 (L) >60 mL/min   Anion gap 11 5 - 15  CBC     Status: Abnormal   Collection Time: 03/08/19  1:50 PM  Result Value Ref Range   WBC 17.9 (H) 4.0 - 10.5 K/uL   RBC 4.30 3.87 - 5.11 MIL/uL   Hemoglobin 11.4 (L) 12.0 - 15.0 g/dL   HCT 36.6 36.0 - 46.0 %   MCV 85.1 80.0 - 100.0 fL   MCH 26.5  26.0 - 34.0 pg   MCHC 31.1 30.0 - 36.0 g/dL   RDW 16.4 (H) 11.5 - 15.5 %   Platelets 265 150 - 400 K/uL   nRBC 0.0 0.0 - 0.2 %  Hepatic function panel     Status: Abnormal   Collection Time: 03/08/19  1:50 PM  Result Value Ref Range   Total Protein 7.7 6.5 - 8.1 g/dL   Albumin 3.4 (L) 3.5 - 5.0 g/dL   AST 28 15 - 41 U/L   ALT 18 0 - 44 U/L   Alkaline Phosphatase 70 38 - 126 U/L   Total Bilirubin 1.1 0.3 - 1.2 mg/dL   Bilirubin, Direct 0.2 0.0 - 0.2 mg/dL   Indirect Bilirubin 0.9 0.3 - 0.9 mg/dL  Lipase, blood     Status: None   Collection Time: 03/08/19  1:50 PM  Result Value Ref Range   Lipase 25 11 - 51 U/L  SARS Coronavirus 2 [facility] order, Performed in [place] hospital lab) Nasopharyngeal Nasopharyngeal Swab     Status: None   Collection Time: 03/08/19  7:54 PM   Specimen: Nasopharyngeal Swab  Result Value Ref Range   SARS Coronavirus 2 NEGATIVE NEGATIVE  Urinalysis, Complete w Microscopic     Status: Abnormal   Collection Time: 03/08/19  9:25 PM  Result Value Ref Range   Color, Urine AMBER (A) YELLOW   APPearance CLOUDY (A) CLEAR   Specific Gravity, Urine 1.030 1.005 - 1.030   pH 5.0 5.0 - 8.0   Glucose, UA NEGATIVE NEGATIVE mg/dL   Hgb urine dipstick NEGATIVE NEGATIVE   Bilirubin Urine NEGATIVE NEGATIVE   Ketones, ur 5 (A) NEGATIVE mg/dL   Protein, ur 100 (A) NEGATIVE mg/dL   Nitrite NEGATIVE NEGATIVE   Leukocytes,Ua MODERATE (A) NEGATIVE   RBC / HPF 6-10 0 - 5 RBC/hpf   WBC, UA 21-50 0 - 5 WBC/hpf   Bacteria, UA RARE (A) NONE SEEN   Squamous Epithelial / LPF 21-50 0 - 5   Mucus PRESENT    Hyaline Casts, UA PRESENT    ____________________________________________  EKG My review and personal interpretation at Time: 13:58   Indication: weakness  Rate: 100  Rhythm: sinus Axis: normal Other: nonspecific st abn ____________________________________________  RADIOLOGY  I personally reviewed all radiographic images ordered to evaluate for the above acute  complaints  and reviewed radiology reports and findings.  These findings were personally discussed with the patient.  Please see medical record for radiology report.  ____________________________________________   PROCEDURES  Procedure(s) performed:  Procedures  Due to difficulty with obtaining IV access, a 20G peripheral IV catheter was inserted using US guidance into the [place].  The site was prepped with chlorhexidine and allowed to dry.  The patient tolerated the procedure without any complications.   Critical Care performed: no ____________________________________________   INITIAL IMPRESSION / ASSESSMENT AND PLAN / ED COURSE  Pertinent labs & imaging results that were available during my care of the patient were reviewed by me and considered in my medical decision making (see chart for details).   DDX: sepsis, uti, pyelo, cholecystitis, colitis, cellulitis  Marlis Junius Roads is a 53 y.o. who presents to the ED with sx as described above.  The patient will be placed on continuous pulse oximetry and telemetry for monitoring.  Laboratory evaluation will be sent to evaluate for the above complaints.     Clinical Course as of Mar 08 22  Sat Mar 08, 2019  2359 CT imaging fortunately has reassuring does not explain any of her right upper quadrant abdominal pain.  Patient is endorsing dysuria urinary frequency and given her fever and low blood pressure here with white count of 17,000 will admit.  Also complaining of left lower extremity pain which does feel warm to touch and very likely has some cellulitis.  Will give antibiotics.  Discussed with hospitalist.   [PR]    Clinical Course User Index [PR] Merlyn Lot, MD    The patient was evaluated in Emergency Department today for the symptoms described in the history of present illness. He/she was evaluated in the context of the global COVID-19 pandemic, which necessitated consideration that the patient might be at risk for  infection with the SARS-CoV-2 virus that causes COVID-19. Institutional protocols and algorithms that pertain to the evaluation of patients at risk for COVID-19 are in a state of rapid change based on information released by regulatory bodies including the CDC and federal and state organizations. These policies and algorithms were followed during the patient's care in the ED.  As part of my medical decision making, I reviewed the following data within the [place] notes reviewed and incorporated, Labs reviewed, notes from prior ED visits and [place] Controlled Substance Database   ____________________________________________   FINAL CLINICAL IMPRESSION(S) / ED DIAGNOSES  Final diagnoses:  Cellulitis of left lower extremity  Right upper quadrant abdominal pain  Dysuria      NEW MEDICATIONS STARTED DURING THIS VISIT:  New Prescriptions   No medications on file     Note:  This document was prepared using Dragon voice recognition software and may include unintentional dictation errors.    Merlyn Lot, MD 03/09/19 [phone]

## 2019-03-08 NOTE — ED Notes (Signed)
Report given to Gracie, RN 

## 2019-03-08 NOTE — ED Notes (Signed)
X-ray at bedside

## 2019-03-08 NOTE — ED Triage Notes (Signed)
Pt to ED via POV with c/o generalized weakness since yesterday. Pt also reports some fever at home. Pt states that she did not check her temperature, just felt like she had fever. Pt reports feeling nauseated and having abdominal pain today. Pt is in NAD at this time.

## 2019-03-09 ENCOUNTER — Observation Stay
Admit: 2019-03-09 | Discharge: 2019-03-09 | Disposition: A | Discharge status: Home / Self Care | Payer: Medicare Other | Attending: Internal Medicine | Admitting: Internal Medicine

## 2019-03-09 ENCOUNTER — Emergency Department: Payer: Medicare Other

## 2019-03-09 DIAGNOSIS — N390 Urinary tract infection, site not specified: Secondary | ICD-10-CM | POA: Diagnosis not present

## 2019-03-09 DIAGNOSIS — R079 Chest pain, unspecified: Secondary | ICD-10-CM

## 2019-03-09 MED ORDER — ACETAMINOPHEN 325 MG PO TABS: 650.0000 mg | ORAL_TABLET | Freq: Four times a day (QID) | ORAL | Status: DC | PRN

## 2019-03-09 MED ORDER — METOPROLOL TARTRATE 25 MG PO TABS
12.5000 mg | ORAL_TABLET | Freq: Two times a day (BID) | ORAL | Status: DC
  Administered 2019-03-09 – 2019-03-10 (×3): 12.5 mg via ORAL
  Filled 2019-03-09 (×3): qty 1

## 2019-03-09 MED ORDER — VANCOMYCIN HCL IN DEXTROSE 1-5 GM/200ML-% IV SOLN
1000.0000 mg | INTRAVENOUS | Status: DC
  Administered 2019-03-09: 20:00:00 1000 mg via INTRAVENOUS
  Filled 2019-03-09 (×2): qty 200

## 2019-03-09 MED ORDER — FUROSEMIDE 10 MG/ML IJ SOLN
40.0000 mg | Freq: Every day | INTRAMUSCULAR | Status: DC
  Administered 2019-03-09: 13:00:00 40 mg via INTRAVENOUS
  Filled 2019-03-09: qty 4

## 2019-03-09 MED ORDER — SODIUM CHLORIDE 0.9 % IV SOLN
1.0000 g | INTRAVENOUS | Status: DC
  Administered 2019-03-09: 23:00:00 1 g via INTRAVENOUS
  Filled 2019-03-09 (×2): qty 10

## 2019-03-09 MED ORDER — ONDANSETRON HCL 4 MG PO TABS: 4.0000 mg | ORAL_TABLET | Freq: Four times a day (QID) | ORAL | Status: DC | PRN

## 2019-03-09 MED ORDER — ATORVASTATIN CALCIUM 20 MG PO TABS
40.0000 mg | ORAL_TABLET | Freq: Every day | ORAL | Status: DC
  Administered 2019-03-09: 40 mg via ORAL
  Filled 2019-03-09: qty 2

## 2019-03-09 MED ORDER — FERROUS SULFATE 325 (65 FE) MG PO TABS
325.0000 mg | ORAL_TABLET | Freq: Every day | ORAL | Status: DC
  Administered 2019-03-09 – 2019-03-10 (×2): 325 mg via ORAL
  Filled 2019-03-09 (×2): qty 1

## 2019-03-09 MED ORDER — ONDANSETRON HCL 4 MG/2ML IJ SOLN: 4.0000 mg | Freq: Four times a day (QID) | INTRAMUSCULAR | Status: DC | PRN

## 2019-03-09 MED ORDER — LOSARTAN POTASSIUM 50 MG PO TABS
50.0000 mg | ORAL_TABLET | Freq: Every day | ORAL | Status: DC
  Administered 2019-03-09 – 2019-03-10 (×2): 50 mg via ORAL
  Filled 2019-03-09 (×2): qty 1

## 2019-03-09 MED ORDER — ENOXAPARIN SODIUM 40 MG/0.4ML ~~LOC~~ SOLN
40.0000 mg | Freq: Two times a day (BID) | SUBCUTANEOUS | Status: DC
  Filled 2019-03-09: qty 0.4

## 2019-03-09 MED ORDER — VANCOMYCIN HCL 10 G IV SOLR
2000.0000 mg | Freq: Once | INTRAVENOUS | Status: AC
  Administered 2019-03-09: 02:00:00 2000 mg via INTRAVENOUS
  Filled 2019-03-09: qty 2000

## 2019-03-09 MED ORDER — PERFLUTREN LIPID MICROSPHERE
1.0000 mL | INTRAVENOUS | Status: AC | PRN
  Administered 2019-03-09: 16:00:00 3 mL via INTRAVENOUS
  Filled 2019-03-09: qty 10

## 2019-03-09 MED ORDER — ASPIRIN EC 81 MG PO TBEC
81.0000 mg | DELAYED_RELEASE_TABLET | Freq: Every day | ORAL | Status: DC
  Administered 2019-03-09 – 2019-03-10 (×2): 81 mg via ORAL
  Filled 2019-03-09 (×2): qty 1

## 2019-03-09 MED ORDER — ACETAMINOPHEN 650 MG RE SUPP: 650.0000 mg | Freq: Four times a day (QID) | RECTAL | Status: DC | PRN

## 2019-03-09 MED ORDER — CLOPIDOGREL BISULFATE 75 MG PO TABS
75.0000 mg | ORAL_TABLET | Freq: Every day | ORAL | Status: DC
  Administered 2019-03-09 – 2019-03-10 (×2): 75 mg via ORAL
  Filled 2019-03-09 (×2): qty 1

## 2019-03-09 MED ORDER — VITAMIN D (ERGOCALCIFEROL) 1.25 MG (50000 UNIT) PO CAPS
50000.0000 [IU] | ORAL_CAPSULE | ORAL | Status: DC
  Administered 2019-03-10: 10:00:00 50000 [IU] via ORAL
  Filled 2019-03-09: qty 1

## 2019-03-09 NOTE — Care Management Obs Status (Signed)
[facility] NOTIFICATION   Patient Details  Name: Kaysie Glazener MRN: [id number] Date of Birth: [birth year]   Medicare Observation Status Notification Given:  Yes    Mignonne Afonso A Neylan Koroma, RN 03/09/2019, 1:16 PM

## 2019-03-09 NOTE — Progress Notes (Addendum)
Physical Therapy Evaluation Patient Details Name: Sarah Medina MRN: [id number] DOB: December 05, 1965 Today's Date: 03/09/2019   History of Present Illness  Sarah Medina  is a 53 y.o. female who presents with chief complaint   Clinical Impression  Patient performs bed mobility MI,  transfers  MI, and gait with RW  MI 300 feet and decreased gait speed. She has no static or dynamic standing balance deficits and strength is WFL.  She lives in an apartment with her husband,  with no steps, and has no skilled PT needs at this time. She will be DC from therapy.   Follow Up Recommendations  no follow up needed    Equipment Recommendations    no   Recommendations for Other Services   no    Precautions / Restrictions Precautions Precautions: None Restrictions Weight Bearing Restrictions: No none     Mobility  Bed Mobility Overal bed mobility: Independent                Transfers Overall transfer level: Modified independent Equipment used: Rolling walker (2 wheeled)                Ambulation/Gait Ambulation/Gait assistance: Modified independent (Device/Increase time) Gait Distance (Feet): 300 Feet Assistive device: Rolling walker (2 wheeled) Gait Pattern/deviations: Step-through pattern     General Gait Details: (decreased gait speed)  Stairs            Wheelchair Mobility    Modified Rankin (Stroke Patients Only)       Balance Overall balance assessment: Independent                                           Pertinent Vitals/Pain Pain Assessment: No/denies pain    Home Living Family/patient expects to be discharged to:: Private residence Living Arrangements: Spouse/significant other Available Help at Discharge: Family Type of Home: Apartment Home Access: Level entry     Home Layout: One level Home Equipment: Environmental consultant - 2 wheels      Prior Function Level of Independence: Independent with assistive device(s)          Comments: Indep with ADLs, household and community mobilization without assist device     Hand Dominance        Extremity/Trunk Assessment   Upper Extremity Assessment Upper Extremity Assessment: Overall WFL for tasks assessed    Lower Extremity Assessment Lower Extremity Assessment: Overall WFL for tasks assessed       Communication   Communication: No difficulties  Cognition Arousal/Alertness: Awake/alert Behavior During Therapy: WFL for tasks assessed/performed Overall Cognitive Status: Within Functional Limits for tasks assessed                                        General Comments      Exercises     Assessment/Plan    PT Assessment Patent does not need any further PT services  PT Problem List         PT Treatment Interventions      PT Goals (Current goals can be found in the Care Plan section)  Acute Rehab PT Goals Patient Stated Goal: (to go home) PT Goal Formulation: All assessment and education complete, DC therapy    Frequency     Barriers to discharge  Co-evaluation               AM-PAC PT "6 Clicks" Mobility  Outcome Measure Help needed turning from your back to your side while in a flat bed without using bedrails?: None Help needed moving from lying on your back to sitting on the side of a flat bed without using bedrails?: None Help needed moving to and from a bed to a chair (including a wheelchair)?: None Help needed standing up from a chair using your arms (e.g., wheelchair or bedside chair)?: None Help needed to walk in hospital room?: None Help needed climbing 3-5 steps with a railing? : None 6 Click Score: 24    End of Session Equipment Utilized During Treatment: Gait belt Activity Tolerance: Patient tolerated treatment well;Patient limited by fatigue Patient left: in bed;with bed alarm set   PT Visit Diagnosis: Muscle weakness (generalized) (M62.81)    Time: [id number] PT Time Calculation (min)  (ACUTE ONLY): 12 min   Charges:   PT Evaluation $PT Eval Low Complexity: 1 Low            Alanson Puls, PT DPT 03/09/2019, 2:37 PM

## 2019-03-09 NOTE — Progress Notes (Signed)
Pharmacy Antibiotic Note  Sarah Medina is a 53 y.o. female admitted on 03/08/2019 with cellulitis.  Pharmacy has been consulted for Vancomycin dosing.  Plan: Pt received vancomycin 2000 mg x 1. Creatinine is elevated, baseline is around 0.7-0.8. Will need to dose by levels. Vancomycin random ordered for midnight. Recommendations (but use clinical judgement): If level > 20 - hold dose and re-check level in 12-24 hours, if 12-20 dose with 15 mg/kg, if < 12 re-dose with 20 mg/kg. Goal trough 10-15 for cellulitis.    Height: 5\' 6"  (167.6 cm) Weight: 273 lb 9.5 oz (124.1 kg) IBW/kg (Calculated) : 59.3  Temp (24hrs), Avg:98.7 F (37.1 C), Min:98.2 F (36.8 C), Max:99.1 F (37.3 C)  Recent Labs  Lab 03/08/19 1350  WBC 17.9*  CREATININE 1.33*    Estimated Creatinine Clearance: 65 mL/min (A) (by C-G formula based on SCr of 1.33 mg/dL (H)).    Allergies  Allergen Reactions  . Enalapril Swelling    Swelling face    Antimicrobials this admission: Rocephin 9/6 >>  Vancomycin 9/6 >>   Dose adjustments this admission:   Microbiology results: 9/5 UCx: pending   Thank you for allowing pharmacy to be a part of this patient's care.  Oswald Hillock 03/09/2019 9:34 AM

## 2019-03-09 NOTE — Progress Notes (Signed)
*  PRELIMINARY RESULTS* Echocardiogram 2D Echocardiogram has been performed. Definity IV Contrast used on this study.  Sarah Medina Sarah Medina 03/09/2019, 3:42 PM

## 2019-03-09 NOTE — Progress Notes (Signed)
Patient ID: Sarah Medina, female   DOB: [birth year], 53 y.o.   MRN: [id number]  [employer] [facility] PROGRESS NOTE  Sarah Medina [id number] DOB: 1966-01-10 DOA: 03/08/2019 PCP: Perrin Maltese, MD  HPI/Subjective: Patient coming in with abdominal pain.  Also some leg swelling.  Does not really complain of shortness of breath or burning on urination.  Objective: Vitals:   03/09/19 0900 03/09/19 1239  BP: 120/64 127/73  Pulse: 92 81  Resp: 20   Temp:    SpO2: 100%     Intake/Output Summary (Last 24 hours) at 03/09/2019 1429 Last data filed at 03/09/2019 0423 Gross per 24 hour  Intake 1171.52 ml  Output -  Net 1171.52 ml   Filed Weights   03/08/19 1347 03/09/19 0207  Weight: (!) 171.5 kg 124.1 kg    ROS: Review of Systems  Constitutional: Negative for chills and fever.  Eyes: Negative for blurred vision.  Respiratory: Negative for cough, shortness of breath and wheezing.   Cardiovascular: Negative for chest pain.  Gastrointestinal: Positive for abdominal pain. Negative for constipation, diarrhea, nausea and vomiting.  Genitourinary: Negative for dysuria.  Musculoskeletal: Negative for joint pain.  Neurological: Negative for dizziness and headaches.   Exam: Physical Exam  Constitutional: She is oriented to person, place, and time.  HENT:  Nose: No mucosal edema.  Mouth/Throat: No oropharyngeal exudate or posterior oropharyngeal edema.  Eyes: Pupils are equal, round, and reactive to light. Conjunctivae, EOM and lids are normal.  Neck: No JVD present. Carotid bruit is not present. No edema present. No thyroid mass and no thyromegaly present.  Cardiovascular: S1 normal and S2 normal. Exam reveals no gallop.  No murmur heard. Pulses:      Dorsalis pedis pulses are 2+ on the right side and 2+ on the left side.  Respiratory: No respiratory distress. She has decreased breath sounds in the right lower field and the left lower field. She has no wheezes. She has no  rhonchi. She has no rales.  GI: Soft. Bowel sounds are normal. There is no abdominal tenderness.  Musculoskeletal:     Right ankle: She exhibits swelling.     Left ankle: She exhibits swelling.  Lymphadenopathy:    She has no cervical adenopathy.  Neurological: She is alert and oriented to person, place, and time. No cranial nerve deficit.  Skin: Skin is warm. No rash noted. Nails show no clubbing.  Psychiatric: She has a normal mood and affect.      Data Reviewed: Basic Metabolic Panel: Recent Labs  Lab 03/08/19 1350  NA 137  K 3.3*  CL 105  CO2 21*  GLUCOSE 114*  BUN 17  CREATININE 1.33*  CALCIUM 8.7*   Liver Function Tests: Recent Labs  Lab 03/08/19 1350  AST 28  ALT 18  ALKPHOS 70  BILITOT 1.1  PROT 7.7  ALBUMIN 3.4*   Recent Labs  Lab 03/08/19 1350  LIPASE 25   CBC: Recent Labs  Lab 03/08/19 1350  WBC 17.9*  HGB 11.4*  HCT 36.6  MCV 85.1  PLT 265     Recent Results (from the past 240 hour(s))  SARS Coronavirus 2 [facility] order, Performed in [facility] hospital lab) Nasopharyngeal Nasopharyngeal Swab     Status: None   Collection Time: 03/08/19  7:54 PM   Specimen: Nasopharyngeal Swab  Result Value Ref Range Status   SARS Coronavirus 2 NEGATIVE NEGATIVE Final    Comment: (NOTE) If result is NEGATIVE SARS-CoV-2 target nucleic acids are NOT  DETECTED. The SARS-CoV-2 RNA is generally detectable in upper and lower  respiratory specimens during the acute phase of infection. The lowest  concentration of SARS-CoV-2 viral copies this assay can detect is 250  copies / mL. A negative result does not preclude SARS-CoV-2 infection  and should not be used as the sole basis for treatment or other  patient management decisions.  A negative result may occur with  improper specimen collection / handling, submission of specimen other  than nasopharyngeal swab, presence of viral mutation(s) within the  areas targeted by this assay, and inadequate number of  viral copies  (<250 copies / mL). A negative result must be combined with clinical  observations, patient history, and epidemiological information. If result is POSITIVE SARS-CoV-2 target nucleic acids are DETECTED. The SARS-CoV-2 RNA is generally detectable in upper and lower  respiratory specimens dur ing the acute phase of infection.  Positive  results are indicative of active infection with SARS-CoV-2.  Clinical  correlation with patient history and other diagnostic information is  necessary to determine patient infection status.  Positive results do  not rule out bacterial infection or co-infection with other viruses. If result is PRESUMPTIVE POSTIVE SARS-CoV-2 nucleic acids MAY BE PRESENT.   A presumptive positive result was obtained on the submitted specimen  and confirmed on repeat testing.  While 2019 novel coronavirus  (SARS-CoV-2) nucleic acids may be present in the submitted sample  additional confirmatory testing may be necessary for epidemiological  and / or clinical management purposes  to differentiate between  SARS-CoV-2 and other Sarbecovirus currently known to infect humans.  If clinically indicated additional testing with an alternate test  methodology [id number]) is advised. The SARS-CoV-2 RNA is generally  detectable in upper and lower respiratory sp ecimens during the acute  phase of infection. The expected result is Negative. Fact Sheet for Patients:  StrictlyIdeas.no Fact Sheet for Healthcare Providers: BankingDealers.co.za This test is not yet approved or cleared by the [place] FDA and has been authorized for detection and/or diagnosis of SARS-CoV-2 by FDA under an Emergency Use Authorization (EUA).  This EUA will remain in effect (meaning this test can be used) for the duration of the COVID-19 declaration under Section 564(b)(1) of the Act, 21 U.S.C. section 360bbb-3(b)(1), unless the authorization is  terminated or revoked sooner. Performed at [facility], [address] [place], [place] [address]      Studies: Ct Abdomen Pelvis W Contrast  Result Date: 03/08/2019 CLINICAL DATA:  Abdominal pain, fever. EXAM: CT ABDOMEN AND PELVIS WITH CONTRAST TECHNIQUE: Multidetector CT imaging of the abdomen and pelvis was performed using the standard protocol following bolus administration of intravenous contrast. CONTRAST:  120mL OMNIPAQUE IOHEXOL 300 MG/ML  SOLN COMPARISON:  12/27/2018 FINDINGS: Lower chest: No acute abnormality. Hepatobiliary: No focal hepatic abnormality. Gallbladder unremarkable. Pancreas: No focal abnormality or ductal dilatation. Spleen: No focal abnormality.  Normal size. Adrenals/Urinary Tract: No adrenal abnormality. No focal renal abnormality. No stones or hydronephrosis. Urinary bladder is unremarkable. Stomach/Bowel: Normal appendix. Stomach, large and small bowel grossly unremarkable. Vascular/Lymphatic: No evidence of aneurysm or adenopathy. Reproductive: 3.9 cm exophytic fibroid off the lower anterior uterus. No adnexal mass. Other: No free fluid or free air. Musculoskeletal: No acute bony abnormality. IMPRESSION: No acute findings in the abdomen or pelvis. Uterine fibroid. Electronically Signed   By: Rolm Baptise M.D.   On: 03/08/2019 23:33   US Venous Img Lower Unilateral Left  Result Date: 03/09/2019 CLINICAL DATA:  Initial evaluation for acute  left leg swelling AND PAIN. EXAM: Left LOWER EXTREMITY VENOUS DOPPLER ULTRASOUND TECHNIQUE: Gray-scale sonography with graded compression, as well as color Doppler and duplex ultrasound were performed to evaluate the lower extremity deep venous systems from the level of the common femoral vein and including the common femoral, femoral, profunda femoral, popliteal and calf veins including the posterior tibial, peroneal and gastrocnemius veins when visible. The superficial great saphenous vein was also interrogated. Spectral  Doppler was utilized to evaluate flow at rest and with distal augmentation maneuvers in the common femoral, femoral and popliteal veins. COMPARISON:  None. FINDINGS: Contralateral Common Femoral Vein: Respiratory phasicity is normal and symmetric with the symptomatic side. No evidence of thrombus. Normal compressibility. Common Femoral Vein: No evidence of thrombus. Normal compressibility, respiratory phasicity and response to augmentation. Saphenofemoral Junction: No evidence of thrombus. Normal compressibility and flow on color Doppler imaging. Profunda Femoral Vein: No evidence of thrombus. Normal compressibility and flow on color Doppler imaging. Femoral Vein: No evidence of thrombus. Normal compressibility, respiratory phasicity and response to augmentation. Popliteal Vein: No evidence of thrombus. Normal compressibility, respiratory phasicity and response to augmentation. Calf Veins: No evidence of thrombus. Normal compressibility and flow on color Doppler imaging. Superficial Great Saphenous Vein: No evidence of thrombus. Normal compressibility. Venous Reflux:  None. Other Findings:  None. IMPRESSION: No evidence of deep venous thrombosis. Electronically Signed   By: Jeannine Boga M.D.   On: 03/09/2019 01:07   Dg Chest Portable 1 View  Result Date: 03/08/2019 CLINICAL DATA:  Fever, weakness EXAM: PORTABLE CHEST 1 VIEW COMPARISON:  None. FINDINGS: Heart and mediastinal contours are within normal limits. No focal opacities or effusions. No acute bony abnormality. IMPRESSION: No active disease. Electronically Signed   By: Rolm Baptise M.D.   On: 03/08/2019 20:04    Scheduled Meds: . aspirin EC  81 mg Oral Daily  . atorvastatin  40 mg Oral q1800  . clopidogrel  75 mg Oral Daily  . ferrous sulfate  325 mg Oral Daily  . furosemide  40 mg Intravenous Daily  . lidocaine (PF)  5 mL Intradermal Once  . losartan  50 mg Oral Daily  . metoprolol tartrate  12.5 mg Oral BID  . sodium chloride flush  3  mL Intravenous Once  . [START ON 03/10/2019] Vitamin D (Ergocalciferol)  50,000 Units Oral Q Mon   Continuous Infusions: . cefTRIAXone (ROCEPHIN)  IV    . vancomycin      Assessment/Plan:  1. Lower abdominal pain with acute cystitis.  Continue IV Rocephin and follow-up urine culture. 2. Left lower extremity swelling. Questionable cellulitis. Patient on rocephin and given vancomycin.  No dvt on ultrasound.  Start lasix and get echo. 3. Hypertension. Continue bp meds 4. Hyperlipidemia on statin 5. Hx cad- on asa, plavix, metoprolol and statin 6. Weakness- PT evaluation  Code Status:     Code Status Orders  (From admission, onward)         Start     Ordered   03/09/19 0204  Full code  Continuous     03/09/19 0203        Code Status History    Date Active Date Inactive Code Status Order ID Comments User Context   12/25/2018 1215 12/29/2018 1827 Full Code [id number]  Lang Snow, NP ED   07/12/2016 0034 07/13/2016 1607 Full Code [id number]  Harvie Bridge, DO ED   07/23/2015 1633 07/27/2015 1800 Full Code [id number]  Lorretta Harp, MD Inpatient   07/23/2015 1435  07/23/2015 1633 Full Code [id number]  Isaiah Serge, NP Inpatient   07/20/2015 1012 07/22/2015 1824 Full Code [id number]  Isaias Cowman, MD Inpatient   Advance Care Planning Activity     Family Communication: spoke with husband on the phone Disposition Plan: potential discharge tomorrow  Antibiotics:  Rocephin and vancomycin  Time spent: 36 minutes  [place]

## 2019-03-09 NOTE — ED Notes (Signed)
Per lab unable to get blood cultures, md notified

## 2019-03-09 NOTE — H&P (Signed)
[facility] NAME: Sarah Medina    MR#:  [id number]  DATE OF BIRTH:  12-Jul-1965  DATE OF ADMISSION:  03/08/2019  PRIMARY CARE PHYSICIAN: Perrin Maltese, MD   REQUESTING/REFERRING PHYSICIAN: Quentin Cornwall, MD  CHIEF COMPLAINT:   Chief Complaint  Patient presents with  . Weakness    HISTORY OF PRESENT ILLNESS:  Sheray Erling  is a 53 y.o. female who presents with chief complaint as above.  Planes of weakness, left lower extremity swelling and warmth, as well as some dysuria and foul-smelling urine.  Here in the ED she is found to have a UTI, and likely lower extremity cellulitis.  She states that she was here in the hospital not too long ago and treated with antibiotics for cellulitis in the same leg.  She states that it improved for a very brief period of time and then started to get worse again.  Hospitalist were called for admission  PAST MEDICAL HISTORY:   Past Medical History:  Diagnosis Date  . Arthritis    hips, right leg  . CAD in native artery, RCA, LAD and LCX 07/23/2015  . Carotid artery occlusion   . H/O myocardial infarction less than 8 weeks- STEMI inf wall 07/20/15- stent to RCA 07/20/15   inf wall STEMI with DES to RCA  . Hypercholesteremia   . Hypertension   . Periorbital edema    treated with steroids  . Seizures ([place])    as teenager, ages [age].  none since  . STEMI (ST elevation myocardial infarction) ([place]) 07/23/15     PAST SURGICAL HISTORY:   Past Surgical History:  Procedure Laterality Date  . CARDIAC CATHETERIZATION N/A 07/20/2015   Procedure: Left Heart Cath and Coronary Angiography;  Surgeon: Isaias Cowman, MD;  Location: [facility];  Service: Cardiovascular;  Laterality: N/A;  . CARDIAC CATHETERIZATION N/A 07/20/2015   Procedure: Coronary Stent Intervention;  Surgeon: Isaias Cowman, MD;  Location: [facility];  Service: Cardiovascular;  Laterality: N/A;  .  CARDIAC CATHETERIZATION N/A 07/23/2015   Procedure: Left Heart Cath and Coronary Angiography;  Surgeon: Lorretta Harp, MD;  Location: [facility];  Service: Cardiovascular;  Laterality: N/A;  . CARDIAC CATHETERIZATION N/A 07/23/2015   Procedure: Coronary Balloon Angioplasty;  Surgeon: Lorretta Harp, MD;  Location: [facility];  Service: Cardiovascular;  Laterality: N/A;  . CARDIAC CATHETERIZATION N/A 07/12/2016   Procedure: Left Heart Cath;  Surgeon: Teodoro Spray, MD;  Location: [facility];  Service: Cardiovascular;  Laterality: N/A;  . CARDIAC CATHETERIZATION N/A 07/12/2016   Procedure: Left Heart Cath and Coronary Angiography;  Surgeon: Isaias Cowman, MD;  Location: [facility];  Service: Cardiovascular;  Laterality: N/A;  . CARDIAC CATHETERIZATION N/A 07/12/2016   Procedure: Intravascular Pressure Wire/FFR Study;  Surgeon: Isaias Cowman, MD;  Location: [facility];  Service: Cardiovascular;  Laterality: N/A;  . CARDIAC CATHETERIZATION N/A 07/12/2016   Procedure: Coronary Stent Intervention;  Surgeon: Isaias Cowman, MD;  Location: [facility];  Service: Cardiovascular;  Laterality: N/A;  . KNEE SURGERY Right 15 years ago     SOCIAL HISTORY:   Social History   Tobacco Use  . Smoking status: Never Smoker  . Smokeless tobacco: Former Systems developer    Types: Snuff  Substance Use Topics  . Alcohol use: No     FAMILY HISTORY:   Family History  Problem Relation Age of Onset  .  Breast cancer Sister 71     DRUG ALLERGIES:   Allergies  Allergen Reactions  . Enalapril Swelling    Swelling face    MEDICATIONS AT HOME:   Prior to Admission medications   Medication Sig Start Date End Date Taking? Authorizing Provider  aspirin EC 81 MG tablet Take 81 mg by mouth daily.   Yes [provider]  ergocalciferol (VITAMIN D2) 1.25 MG (50000 UT) capsule Take 50,000 Units by mouth every Monday.   Yes [provider]  ferrous sulfate 325 (65 FE) MG tablet Take 325 mg by mouth daily.   Yes [provider]  atorvastatin (LIPITOR) 80 MG tablet Take 1 tablet (80 mg total) by mouth daily at 6 PM. Patient taking differently: Take 40 mg by mouth daily at 6 PM.  07/27/15   Eileen Stanford, PA-C  clopidogrel (PLAVIX) 75 MG tablet Take 1 tablet (75 mg total) by mouth daily. 01/26/16   Tawni Millers, MD  fluticasone (FLONASE) 50 MCG/ACT nasal spray Place 1 spray into both nostrils daily. 12/29/18   Henreitta Leber, MD  furosemide (LASIX) 20 MG tablet  01/02/19   [provider]  hydrochlorothiazide (HYDRODIURIL) 12.5 MG tablet Take 12.5 mg by mouth daily.    [provider]  losartan (COZAAR) 50 MG tablet Take 50 mg by mouth daily.    [provider]  medroxyPROGESTERone (PROVERA) 10 MG tablet Take 1 tablet (10 mg total) by mouth daily. Patient not taking: Reported on 01/31/2019 12/29/18   Henreitta Leber, MD  metoprolol tartrate (LOPRESSOR) 25 MG tablet Take 0.5 tablets (12.5 mg total) by mouth 2 (two) times daily. 07/13/16   Hillary Bow, MD  nitroGLYCERIN (NITROSTAT) 0.4 MG SL tablet Place 1 tablet (0.4 mg total) under the tongue every 5 (five) minutes x 3 doses as needed for chest pain. 07/27/15   Eileen Stanford, PA-C    REVIEW OF SYSTEMS:  Review of Systems  Constitutional: Positive for malaise/fatigue. Negative for chills, fever and weight loss.  HENT: Negative for ear pain, hearing loss and tinnitus.   Eyes: Negative for blurred vision, double vision, pain and redness.  Respiratory: Negative for cough, hemoptysis and shortness of breath.   Cardiovascular: Positive for leg swelling (Left lower extremity). Negative for chest pain, palpitations and orthopnea.  Gastrointestinal: Negative for abdominal pain, constipation, diarrhea, nausea and vomiting.  Genitourinary: Positive for dysuria. Negative for frequency and hematuria.  Musculoskeletal: Negative for  back pain, joint pain and neck pain.  Skin:       Erythema and warmth of left lower extremity  Neurological: Negative for dizziness, tremors, focal weakness and weakness.  Endo/Heme/Allergies: Negative for polydipsia. Does not bruise/bleed easily.  Psychiatric/Behavioral: Negative for depression. The patient is not nervous/anxious and does not have insomnia.      VITAL SIGNS:   Vitals:   03/08/19 1900 03/08/19 1930 03/08/19 2205 03/08/19 2206  BP: 106/74 106/62 128/65   Pulse: 83 74  65  Resp:      Temp:      TempSrc:      SpO2: 97% 98%  96%  Weight:      Height:       Wt Readings from Last 3 Encounters:  03/08/19 (!) 171.5 kg  02/26/19 126.1 kg  01/31/19 126.6 kg    PHYSICAL EXAMINATION:  Physical Exam  Vitals reviewed. Constitutional: She is oriented to person, place, and time. She appears well-developed and well-nourished. No distress.  HENT:  Head:  Normocephalic and atraumatic.  Mouth/Throat: Oropharynx is clear and moist.  Eyes: Pupils are equal, round, and reactive to light. Conjunctivae and EOM are normal. No scleral icterus.  Neck: Normal range of motion. Neck supple. No JVD present. No thyromegaly present.  Cardiovascular: Normal rate, regular rhythm and intact distal pulses. Exam reveals no gallop and no friction rub.  No murmur heard. Respiratory: Effort normal and breath sounds normal. No respiratory distress. She has no wheezes. She has no rales.  GI: Soft. Bowel sounds are normal. She exhibits no distension. There is no abdominal tenderness.  Musculoskeletal: Normal range of motion.        General: No edema.     Comments: No arthritis, no gout  Lymphadenopathy:    She has no cervical adenopathy.  Neurological: She is alert and oriented to person, place, and time. No cranial nerve deficit.  No dysarthria, no aphasia  Skin: Skin is warm and dry. No rash noted. There is erythema (With warmth, swelling, tenderness of left lower extremity).  Psychiatric: She  has a normal mood and affect. Her behavior is normal. Judgment and thought content normal.    LABORATORY PANEL:   CBC Recent Labs  Lab 03/08/19 1350  WBC 17.9*  HGB 11.4*  HCT 36.6  PLT 265   ------------------------------------------------------------------------------------------------------------------  Chemistries  Recent Labs  Lab 03/08/19 1350  NA 137  K 3.3*  CL 105  CO2 21*  GLUCOSE 114*  BUN 17  CREATININE 1.33*  CALCIUM 8.7*  AST 28  ALT 18  ALKPHOS 70  BILITOT 1.1   ------------------------------------------------------------------------------------------------------------------  Cardiac Enzymes No results for input(s): TROPONINI in the last 168 hours. ------------------------------------------------------------------------------------------------------------------  RADIOLOGY:  Ct Abdomen Pelvis W Contrast  Result Date: 03/08/2019 CLINICAL DATA:  Abdominal pain, fever. EXAM: CT ABDOMEN AND PELVIS WITH CONTRAST TECHNIQUE: Multidetector CT imaging of the abdomen and pelvis was performed using the standard protocol following bolus administration of intravenous contrast. CONTRAST:  160mL OMNIPAQUE IOHEXOL 300 MG/ML  SOLN COMPARISON:  12/27/2018 FINDINGS: Lower chest: No acute abnormality. Hepatobiliary: No focal hepatic abnormality. Gallbladder unremarkable. Pancreas: No focal abnormality or ductal dilatation. Spleen: No focal abnormality.  Normal size. Adrenals/Urinary Tract: No adrenal abnormality. No focal renal abnormality. No stones or hydronephrosis. Urinary bladder is unremarkable. Stomach/Bowel: Normal appendix. Stomach, large and small bowel grossly unremarkable. Vascular/Lymphatic: No evidence of aneurysm or adenopathy. Reproductive: 3.9 cm exophytic fibroid off the lower anterior uterus. No adnexal mass. Other: No free fluid or free air. Musculoskeletal: No acute bony abnormality. IMPRESSION: No acute findings in the abdomen or pelvis. Uterine fibroid.  Electronically Signed   By: Rolm Baptise M.D.   On: 03/08/2019 23:33   Dg Chest Portable 1 View  Result Date: 03/08/2019 CLINICAL DATA:  Fever, weakness EXAM: PORTABLE CHEST 1 VIEW COMPARISON:  None. FINDINGS: Heart and mediastinal contours are within normal limits. No focal opacities or effusions. No acute bony abnormality. IMPRESSION: No active disease. Electronically Signed   By: Rolm Baptise M.D.   On: 03/08/2019 20:04    EKG:   Orders placed or performed during the hospital encounter of 03/08/19  . ED EKG  . ED EKG    IMPRESSION AND PLAN:  Principal Problem:   UTI (urinary tract infection) -IV antibiotics given, urine culture sent Active Problems:   Cellulitis of left lower extremity -IV antibiotics given.  Ultrasound of that extremity showed no DVT   CAD in native artery, RCA, LAD and LCX -continue home meds   Essential hypertension -home dose antihypertensives  Hyperlipidemia -home dose antilipid  Chart review performed and case discussed with ED provider. Labs, imaging and/or ECG reviewed by provider and discussed with patient/family. Management plans discussed with the patient and/or family.  COVID-19 status: Pending  DVT PROPHYLAXIS: SubQ lovenox   GI PROPHYLAXIS:  None  ADMISSION STATUS: Observation  CODE STATUS: Full Code Status History    Date Active Date Inactive Code Status Order ID Comments User Context   12/25/2018 1215 12/29/2018 1827 Full Code [id number]  Lang Snow, NP ED   07/12/2016 0034 07/13/2016 1607 Full Code [id number]  [place], [place], DO ED   07/23/2015 1633 07/27/2015 1800 Full Code [id number]  Lorretta Harp, MD Inpatient   07/23/2015 1435 07/23/2015 1633 Full Code [id number]  Isaiah Serge, NP Inpatient   07/20/2015 1012 07/22/2015 1824 Full Code [id number]  Isaias Cowman, MD Inpatient   Advance Care Planning Activity      TOTAL TIME TAKING CARE OF THIS PATIENT: 40 minutes.   This patient was evaluated in the context of the  global COVID-19 pandemic, which necessitated consideration that the patient might be at risk for infection with the SARS-CoV-2 virus that causes COVID-19. Institutional protocols and algorithms that pertain to the evaluation of patients at risk for COVID-19 are in a state of rapid change based on information released by regulatory bodies including the CDC and federal and state organizations. These policies and algorithms were followed to the best of this provider's knowledge to date during the patient's care at this facility.  Ethlyn Daniels 03/09/2019, 12:37 AM  [facility]  Office  [phone]  CC: Primary care physician; Perrin Maltese, MD  Note:  This document was prepared using Dragon voice recognition software and may include unintentional dictation errors.

## 2019-03-09 NOTE — Progress Notes (Signed)
Pharmacy Antibiotic Note  Sarah Medina is a 53 y.o. female admitted on 03/08/2019 with cellulitis.  Pharmacy has been consulted for Vancomycin dosing.  Plan: Vancomycin 1000 mg IV Q 18 hrs. Goal AUC 400-550. Expected AUC: 500.3 SCr used: 1.33   Height: 5\' 6"  (167.6 cm) Weight: 273 lb 9.5 oz (124.1 kg) IBW/kg (Calculated) : 59.3  Temp (24hrs), Avg:98.7 F (37.1 C), Min:98.2 F (36.8 C), Max:99.1 F (37.3 C)  Recent Labs  Lab 03/08/19 1350  WBC 17.9*  CREATININE 1.33*    Estimated Creatinine Clearance: 65 mL/min (A) (by C-G formula based on SCr of 1.33 mg/dL (H)).    Allergies  Allergen Reactions  . Enalapril Swelling    Swelling face    Antimicrobials this admission: Rocephin 9/6 >>  Vancomycin 9/6 >>   Dose adjustments this admission:   Microbiology results: 9/5 UCx: pending   Thank you for allowing pharmacy to be a part of this patient's care.  Hart Robinsons A 03/09/2019 2:38 AM

## 2019-03-09 NOTE — ED Notes (Signed)
ED TO INPATIENT HANDOFF REPORT  ED Nurse Name and Phone #: gracie  S Name/Age/Gender Hilma Favors 53 y.o. female Room/Bed: ED06A/ED06A  Code Status   Code Status: Prior  Home/SNF/Other Home Patient oriented to: self, place, time and situation Is this baseline? Yes   Triage Complete: Triage complete  Chief Complaint Weakness  Triage Note Pt to ED via POV with c/o generalized weakness since yesterday. Pt also reports some fever at home. Pt states that she did not check her temperature, just felt like she had fever. Pt reports feeling nauseated and having abdominal pain today. Pt is in NAD at this time.    Allergies Allergies  Allergen Reactions  . Enalapril Swelling    Swelling face    Level of Care/Admitting Diagnosis ED Disposition    ED Disposition Condition [facility] Area: [place] [[id number]]  Level of Care: Med-Surg [16]  Covid Evaluation: Asymptomatic Screening Protocol (No Symptoms)  Diagnosis: UTI (urinary tract infection) [id number]  Admitting Physician: Lance Coon [id number]  Attending Physician: Lance Coon [id number]  PT Class (Do Not Modify): Observation [104]  PT Acc Code (Do Not Modify): Observation [10022]       B Medical/Surgery History Past Medical History:  Diagnosis Date  . Arthritis    hips, right leg  . CAD in native artery, RCA, LAD and LCX 07/23/2015  . Carotid artery occlusion   . H/O myocardial infarction less than 8 weeks- STEMI inf wall 07/20/15- stent to RCA 07/20/15   inf wall STEMI with DES to RCA  . Hypercholesteremia   . Hypertension   . Periorbital edema    treated with steroids  . Seizures ([place])    as teenager, ages [age].  none since  . STEMI (ST elevation myocardial infarction) ([place]) 07/23/15   Past Surgical History:  Procedure Laterality Date  . CARDIAC CATHETERIZATION N/A 07/20/2015   Procedure: Left Heart Cath and Coronary Angiography;  Surgeon: Isaias Cowman,  MD;  Location: [facility];  Service: Cardiovascular;  Laterality: N/A;  . CARDIAC CATHETERIZATION N/A 07/20/2015   Procedure: Coronary Stent Intervention;  Surgeon: Isaias Cowman, MD;  Location: [facility];  Service: Cardiovascular;  Laterality: N/A;  . CARDIAC CATHETERIZATION N/A 07/23/2015   Procedure: Left Heart Cath and Coronary Angiography;  Surgeon: Lorretta Harp, MD;  Location: [facility];  Service: Cardiovascular;  Laterality: N/A;  . CARDIAC CATHETERIZATION N/A 07/23/2015   Procedure: Coronary Balloon Angioplasty;  Surgeon: Lorretta Harp, MD;  Location: [facility];  Service: Cardiovascular;  Laterality: N/A;  . CARDIAC CATHETERIZATION N/A 07/12/2016   Procedure: Left Heart Cath;  Surgeon: Teodoro Spray, MD;  Location: [facility];  Service: Cardiovascular;  Laterality: N/A;  . CARDIAC CATHETERIZATION N/A 07/12/2016   Procedure: Left Heart Cath and Coronary Angiography;  Surgeon: Isaias Cowman, MD;  Location: [facility];  Service: Cardiovascular;  Laterality: N/A;  . CARDIAC CATHETERIZATION N/A 07/12/2016   Procedure: Intravascular Pressure Wire/FFR Study;  Surgeon: Isaias Cowman, MD;  Location: [facility];  Service: Cardiovascular;  Laterality: N/A;  . CARDIAC CATHETERIZATION N/A 07/12/2016   Procedure: Coronary Stent Intervention;  Surgeon: Isaias Cowman, MD;  Location: [facility];  Service: Cardiovascular;  Laterality: N/A;  . KNEE SURGERY Right 15 years ago     A IV Location/Drains/Wounds Patient Lines/Drains/Airways Status   Active Line/Drains/Airways    Name:   Placement date:   Placement time:  Site:   Days:   Peripheral IV 03/08/19 Right;Upper Arm   03/08/19    2249    Arm   1   Post Cath / Sheath 07/12/16 Right Arterial;Femoral   07/12/16    1056    Arterial;Femoral   970   External Urinary Catheter   12/25/18    1754    -   74          Intake/Output Last 24  hours  Intake/Output Summary (Last 24 hours) at 03/09/2019 0053 Last data filed at 03/09/2019 0053 Gross per 24 hour  Intake 670 ml  Output -  Net 670 ml    Labs/Imaging Results for orders placed or performed during the hospital encounter of 03/08/19 (from the past 48 hour(s))  Basic metabolic panel     Status: Abnormal   Collection Time: 03/08/19  1:50 PM  Result Value Ref Range   Sodium 137 135 - 145 mmol/L   Potassium 3.3 (L) 3.5 - 5.1 mmol/L   Chloride 105 98 - 111 mmol/L   CO2 21 (L) 22 - 32 mmol/L   Glucose, Bld 114 (H) 70 - 99 mg/dL   BUN 17 6 - 20 mg/dL   Creatinine, Ser 1.33 (H) 0.44 - 1.00 mg/dL   Calcium 8.7 (L) 8.9 - 10.3 mg/dL   GFR calc non Af Amer 45 (L) >60 mL/min   GFR calc Af Amer 52 (L) >60 mL/min   Anion gap 11 5 - 15    Comment: Performed at [facility], [address] [place], [place] [address]  CBC     Status: Abnormal   Collection Time: 03/08/19  1:50 PM  Result Value Ref Range   WBC 17.9 (H) 4.0 - 10.5 K/uL   RBC 4.30 3.87 - 5.11 MIL/uL   Hemoglobin 11.4 (L) 12.0 - 15.0 g/dL   HCT 36.6 36.0 - 46.0 %   MCV 85.1 80.0 - 100.0 fL   MCH 26.5 26.0 - 34.0 pg   MCHC 31.1 30.0 - 36.0 g/dL   RDW 16.4 (H) 11.5 - 15.5 %   Platelets 265 150 - 400 K/uL   nRBC 0.0 0.0 - 0.2 %    Comment: Performed at [facility], [address] [place], [place] [address]  Hepatic function panel     Status: Abnormal   Collection Time: 03/08/19  1:50 PM  Result Value Ref Range   Total Protein 7.7 6.5 - 8.1 g/dL   Albumin 3.4 (L) 3.5 - 5.0 g/dL   AST 28 15 - 41 U/L   ALT 18 0 - 44 U/L   Alkaline Phosphatase 70 38 - 126 U/L   Total Bilirubin 1.1 0.3 - 1.2 mg/dL   Bilirubin, Direct 0.2 0.0 - 0.2 mg/dL   Indirect Bilirubin 0.9 0.3 - 0.9 mg/dL    Comment: Performed at [facility], [address] [place], [place] [address]  Lipase, blood     Status: None   Collection Time: 03/08/19  1:50 PM  Result Value Ref Range   Lipase 25 11 - 51 U/L     Comment: Performed at [facility], [address] [place],  [address]  SARS Coronavirus 2 [facility] order, Performed in [facility] hospital lab) Nasopharyngeal Nasopharyngeal Swab     Status: None   Collection Time: 03/08/19  7:54 PM   Specimen: Nasopharyngeal Swab  Result Value Ref Range   SARS Coronavirus 2 NEGATIVE NEGATIVE    Comment: (NOTE) If result  is NEGATIVE SARS-CoV-2 target nucleic acids are NOT DETECTED. The SARS-CoV-2 RNA is generally detectable in upper and lower  respiratory specimens during the acute phase of infection. The lowest  concentration of SARS-CoV-2 viral copies this assay can detect is 250  copies / mL. A negative result does not preclude SARS-CoV-2 infection  and should not be used as the sole basis for treatment or other  patient management decisions.  A negative result may occur with  improper specimen collection / handling, submission of specimen other  than nasopharyngeal swab, presence of viral mutation(s) within the  areas targeted by this assay, and inadequate number of viral copies  (<250 copies / mL). A negative result must be combined with clinical  observations, patient history, and epidemiological information. If result is POSITIVE SARS-CoV-2 target nucleic acids are DETECTED. The SARS-CoV-2 RNA is generally detectable in upper and lower  respiratory specimens dur ing the acute phase of infection.  Positive  results are indicative of active infection with SARS-CoV-2.  Clinical  correlation with patient history and other diagnostic information is  necessary to determine patient infection status.  Positive results do  not rule out bacterial infection or co-infection with other viruses. If result is PRESUMPTIVE POSTIVE SARS-CoV-2 nucleic acids MAY BE PRESENT.   A presumptive positive result was obtained on the submitted specimen  and confirmed on repeat testing.  While 2019 novel coronavirus  (SARS-CoV-2) nucleic acids may be  present in the submitted sample  additional confirmatory testing may be necessary for epidemiological  and / or clinical management purposes  to differentiate between  SARS-CoV-2 and other Sarbecovirus currently known to infect humans.  If clinically indicated additional testing with an alternate test  methodology [phone]) is advised. The SARS-CoV-2 RNA is generally  detectable in upper and lower respiratory sp ecimens during the acute  phase of infection. The expected result is Negative. Fact Sheet for Patients:  StrictlyIdeas.no Fact Sheet for Healthcare Providers: BankingDealers.co.za This test is not yet approved or cleared by the [place] FDA and has been authorized for detection and/or diagnosis of SARS-CoV-2 by FDA under an Emergency Use Authorization (EUA).  This EUA will remain in effect (meaning this test can be used) for the duration of the COVID-19 declaration under Section 564(b)(1) of the Act, 21 U.S.C. section 360bbb-3(b)(1), unless the authorization is terminated or revoked sooner. Performed at [facility], [address] [place], [place] [address]   Urinalysis, Complete w Microscopic     Status: Abnormal   Collection Time: 03/08/19  9:25 PM  Result Value Ref Range   Color, Urine AMBER (A) YELLOW    Comment: BIOCHEMICALS MAY BE AFFECTED BY COLOR   APPearance CLOUDY (A) CLEAR   Specific Gravity, Urine 1.030 1.005 - 1.030   pH 5.0 5.0 - 8.0   Glucose, UA NEGATIVE NEGATIVE mg/dL   Hgb urine dipstick NEGATIVE NEGATIVE   Bilirubin Urine NEGATIVE NEGATIVE   Ketones, ur 5 (A) NEGATIVE mg/dL   Protein, ur 100 (A) NEGATIVE mg/dL   Nitrite NEGATIVE NEGATIVE   Leukocytes,Ua MODERATE (A) NEGATIVE   RBC / HPF 6-10 0 - 5 RBC/hpf   WBC, UA 21-50 0 - 5 WBC/hpf   Bacteria, UA RARE (A) NONE SEEN   Squamous Epithelial / LPF 21-50 0 - 5   Mucus PRESENT    Hyaline Casts, UA PRESENT     Comment: Performed at  [facility], [address], [place], [place] [address]   Ct Abdomen Pelvis W Contrast  Result Date: 03/08/2019 CLINICAL DATA:  Abdominal pain, fever. EXAM: CT ABDOMEN AND PELVIS WITH CONTRAST TECHNIQUE: Multidetector CT imaging of the abdomen and pelvis was performed using the standard protocol following bolus administration of intravenous contrast. CONTRAST:  196mL OMNIPAQUE IOHEXOL 300 MG/ML  SOLN COMPARISON:  12/27/2018 FINDINGS: Lower chest: No acute abnormality. Hepatobiliary: No focal hepatic abnormality. Gallbladder unremarkable. Pancreas: No focal abnormality or ductal dilatation. Spleen: No focal abnormality.  Normal size. Adrenals/Urinary Tract: No adrenal abnormality. No focal renal abnormality. No stones or hydronephrosis. Urinary bladder is unremarkable. Stomach/Bowel: Normal appendix. Stomach, large and small bowel grossly unremarkable. Vascular/Lymphatic: No evidence of aneurysm or adenopathy. Reproductive: 3.9 cm exophytic fibroid off the lower anterior uterus. No adnexal mass. Other: No free fluid or free air. Musculoskeletal: No acute bony abnormality. IMPRESSION: No acute findings in the abdomen or pelvis. Uterine fibroid. Electronically Signed   By: Rolm Baptise M.D.   On: 03/08/2019 23:33   Dg Chest Portable 1 View  Result Date: 03/08/2019 CLINICAL DATA:  Fever, weakness EXAM: PORTABLE CHEST 1 VIEW COMPARISON:  None. FINDINGS: Heart and mediastinal contours are within normal limits. No focal opacities or effusions. No acute bony abnormality. IMPRESSION: No active disease. Electronically Signed   By: Rolm Baptise M.D.   On: 03/08/2019 20:04    Pending Labs Unresulted Labs (From admission, onward)    Start     Ordered   03/08/19 2357  Blood culture (routine x 2)  BLOOD CULTURE X 2,   STAT     03/08/19 2356   Signed and Held  HIV antibody (Routine Testing)  Once,   R     Signed and Held   Signed and Held  CBC  (enoxaparin (LOVENOX)    CrCl >/= 30 ml/min)  Once,   R     Comments: Baseline for enoxaparin therapy IF NOT ALREADY DRAWN.  Notify MD if PLT < 100 K.    Signed and Held   Signed and Held  Creatinine, serum  (enoxaparin (LOVENOX)    CrCl >/= 30 ml/min)  Once,   R    Comments: Baseline for enoxaparin therapy IF NOT ALREADY DRAWN.    Signed and Held   Signed and Held  Creatinine, serum  (enoxaparin (LOVENOX)    CrCl >/= 30 ml/min)  Weekly,   R    Comments: while on enoxaparin therapy    Signed and Held   Signed and Held  Basic metabolic panel  Tomorrow morning,   R     Signed and Held   Signed and Held  CBC  Tomorrow morning,   R     Signed and Held          Vitals/Pain Today's Vitals   03/08/19 1900 03/08/19 1930 03/08/19 2205 03/08/19 2206  BP: 106/74 106/62 128/65   Pulse: 83 74  65  Resp:      Temp:      TempSrc:      SpO2: 97% 98%  96%  Weight:      Height:      PainSc:        Isolation Precautions No active isolations  Medications Medications  sodium chloride flush (NS) 0.9 % injection 3 mL (3 mLs Intravenous Not Given 03/08/19 1840)  lidocaine (PF) (XYLOCAINE) 1 % injection 5 mL (has no administration in time range)  vancomycin (VANCOCIN) 2,000 mg in sodium chloride 0.9 % 500 mL IVPB (has no administration in time range)  sodium chloride 0.9 % bolus 500 mL (  0 mLs Intravenous Stopped 03/09/19 0053)  iohexol (OMNIPAQUE) 300 MG/ML solution 125 mL (150 mLs Intravenous Contrast Given 03/08/19 2307)  cefTRIAXone (ROCEPHIN) 1 g in sodium chloride 0.9 % 100 mL IVPB (0 g Intravenous Stopped 03/09/19 0053)    Mobility walks Low fall risk   Focused Assessments GU   R Recommendations: See Admitting Provider Note  Report given to:   Additional Notes:

## 2019-03-09 NOTE — Progress Notes (Signed)
PHARMACY -  BRIEF ANTIBIOTIC NOTE   Pharmacy has received consult(s) for Vancomycin from an ED provider.  The patient's profile has been reviewed for ht/wt/allergies/indication/available labs.    One time order(s) placed for Vancomycin 2000mg  x 1  Further antibiotics/pharmacy consults should be ordered by admitting physician if indicated.                       Thank you, Hart Robinsons A 03/09/2019  12:05 AM

## 2019-03-09 NOTE — Progress Notes (Signed)
PHARMACIST - PHYSICIAN COMMUNICATION  CONCERNING:  Enoxaparin (Lovenox) for DVT Prophylaxis    RECOMMENDATION: Patient was prescribed enoxaprin 40mg  q24 hours for VTE prophylaxis.   Filed Weights   03/08/19 1347  Weight: (!) 378 lb (171.5 kg)   Body mass index is 61.01 kg/m.  Estimated Creatinine Clearance: 79.5 mL/min (A) (by C-G formula based on SCr of 1.33 mg/dL (H)).   Based on [place] patient is candidate for enoxaparin 40mg  every 12 hour dosing due to BMI being >40.  DESCRIPTION: Pharmacy has adjusted enoxaparin dose per [facility] policy.  Patient is now receiving enoxaparin 40mg  every 12 hours.   Ena Dawley, PharmD Clinical Pharmacist  03/09/2019 2:18 AM

## 2019-03-10 DIAGNOSIS — N390 Urinary tract infection, site not specified: Secondary | ICD-10-CM | POA: Diagnosis not present

## 2019-03-10 LAB — BASIC METABOLIC PANEL
Anion gap: 10 (ref 5–15)
BUN: 15 mg/dL (ref 6–20)
CO2: 27 mmol/L (ref 22–32)
Calcium: 8.5 mg/dL — ABNORMAL LOW (ref 8.9–10.3)
Chloride: 103 mmol/L (ref 98–111)
Creatinine, Ser: 0.98 mg/dL (ref 0.44–1.00)
GFR calc Af Amer: >60 mL/min (ref >60)
GFR calc non Af Amer: >60 mL/min (ref >60)
Glucose, Bld: 88 mg/dL (ref 70–99)
Potassium: 3.2 mmol/L — ABNORMAL LOW (ref 3.5–5.1)
Sodium: 140 mmol/L (ref 135–145)

## 2019-03-10 LAB — CBC
HCT: 35.2 % — ABNORMAL LOW (ref 36.0–46.0)
Hemoglobin: 10.9 g/dL — ABNORMAL LOW (ref 12.0–15.0)
MCH: 26.2 pg (ref 26.0–34.0)
MCHC: 31 g/dL (ref 30.0–36.0)
MCV: 84.6 fL (ref 80.0–100.0)
Platelets: 277 10*3/uL (ref 150–400)
RBC: 4.16 MIL/uL (ref 3.87–5.11)
RDW: 16.3 % — ABNORMAL HIGH (ref 11.5–15.5)
WBC: 9.1 10*3/uL (ref 4.0–10.5)
nRBC: 0 % (ref 0.0–0.2)

## 2019-03-10 LAB — URINE CULTURE

## 2019-03-10 LAB — VANCOMYCIN, RANDOM: Vancomycin Rm: 24

## 2019-03-10 MED ORDER — TOLNAFTATE 1 % EX CREA: 1.0000 "application " | TOPICAL_CREAM | Freq: Two times a day (BID) | CUTANEOUS | 0 refills | Status: DC

## 2019-03-10 MED ORDER — ATORVASTATIN CALCIUM 80 MG PO TABS: 40.0000 mg | ORAL_TABLET | Freq: Every day | ORAL | Status: DC

## 2019-03-10 MED ORDER — AMMONIUM LACTATE 12 % EX CREA: TOPICAL_CREAM | CUTANEOUS | 0 refills | Status: DC

## 2019-03-10 MED ORDER — POTASSIUM CHLORIDE CRYS ER 20 MEQ PO TBCR
20.0000 meq | EXTENDED_RELEASE_TABLET | Freq: Every day | ORAL | Status: DC
  Administered 2019-03-10: 10:00:00 20 meq via ORAL
  Filled 2019-03-10: qty 1

## 2019-03-10 MED ORDER — CEPHALEXIN 500 MG PO CAPS: 500.0000 mg | ORAL_CAPSULE | Freq: Four times a day (QID) | ORAL | 0 refills | Status: AC

## 2019-03-10 MED ORDER — POTASSIUM CHLORIDE CRYS ER 20 MEQ PO TBCR
40.0000 meq | EXTENDED_RELEASE_TABLET | Freq: Once | ORAL | Status: AC
  Administered 2019-03-10: 10:00:00 40 meq via ORAL
  Filled 2019-03-10: qty 2

## 2019-03-10 NOTE — Discharge Instructions (Signed)

## 2019-03-10 NOTE — Discharge Summary (Signed)
[facility] NAME: Gerry Kipps    MR#:  [id number]  DATE OF BIRTH:  1966/03/20  DATE OF ADMISSION:  03/08/2019 ADMITTING PHYSICIAN: Lance Coon, MD  DATE OF DISCHARGE: 03/10/2019 11:38 AM  PRIMARY CARE PHYSICIAN: Perrin Maltese, MD    ADMISSION DIAGNOSIS:  Dysuria [R30.0] Cellulitis of left lower extremity [L03.116] Right upper quadrant abdominal pain [R10.11]  DISCHARGE DIAGNOSIS:  Principal Problem:   UTI (urinary tract infection) Active Problems:   CAD in native artery, RCA, LAD and LCX   Essential hypertension   Hyperlipidemia   Cellulitis of left lower extremity   SECONDARY DIAGNOSIS:   Past Medical History:  Diagnosis Date  . Arthritis    hips, right leg  . CAD in native artery, RCA, LAD and LCX 07/23/2015  . Carotid artery occlusion   . H/O myocardial infarction less than 8 weeks- STEMI inf wall 07/20/15- stent to RCA 07/20/15   inf wall STEMI with DES to RCA  . Hypercholesteremia   . Hypertension   . Periorbital edema    treated with steroids  . Seizures ([place])    as teenager, ages [age].  none since  . STEMI (ST elevation myocardial infarction) ([place]) 07/23/15    HOSPITAL COURSE:  1.  Lower abdominal pain with acute cystitis.  The patient was given IV Rocephin while here.  Unfortunately urine culture grew out contamination.  This result came back after the patient was already discharged.  Patient switched over to Keflex. 2.  Left lower extremity cellulitis and swelling.  Ultrasound showed no DVT.  The patient was given Rocephin and vancomycin initially.  There was no drainage from any of the sites so I do think this is likely a strep infection so antibiotics switched over to Keflex upon discharge home for another 8 days. 3.  Hypertension.  Continue usual blood pressure medications 4.  Hyperlipidemia unspecified on statin 5. history of CAD on aspirin, Plavix, metoprolol and statin 6.  Weakness.  Did well with  physical therapy and no needs. 7.  Hard skin on the bottom of foot with some scaling.  Likely fungal infection.  Need to give Lac-Hydrin lotion twice a day to loosen this up and then Tinactin after 1 week. 8.  Echocardiogram done on this hospital stay but results are still pending at the time of discharge. 9.  Hypokalemia secondary to giving IV Lasix.  Can go back on same dose oral Lasix as outpatient.  Potassium replaced in the hospital.  I did not give a Lasix on the day of discharge.  DISCHARGE CONDITIONS:   Satisfactory  CONSULTS OBTAINED:  None  DRUG ALLERGIES:   Allergies  Allergen Reactions  . Enalapril Swelling    Swelling face    DISCHARGE MEDICATIONS:   Allergies as of 03/10/2019      Reactions   Enalapril Swelling   Swelling face      Medication List    TAKE these medications   ammonium lactate 12 % cream Commonly known as: Lac-Hydrin Apply to feet twice a dat   aspirin EC 81 MG tablet Take 81 mg by mouth daily.   atorvastatin 80 MG tablet Commonly known as: LIPITOR Take 0.5 tablets (40 mg total) by mouth daily at 6 PM.   cephALEXin 500 MG capsule Commonly known as: KEFLEX Take 1 capsule (500 mg total) by mouth 4 (four) times daily for 8 days.   clopidogrel 75 MG tablet Commonly known as: PLAVIX Take  1 tablet (75 mg total) by mouth daily.   ergocalciferol 1.25 MG (50000 UT) capsule Commonly known as: VITAMIN D2 Take 50,000 Units by mouth every Monday.   ferrous sulfate 325 (65 FE) MG tablet Take 325 mg by mouth daily.   furosemide 20 MG tablet Commonly known as: LASIX Take 20 mg by mouth daily.   losartan 50 MG tablet Commonly known as: COZAAR Take 50 mg by mouth daily.   metoprolol tartrate 25 MG tablet Commonly known as: LOPRESSOR Take 0.5 tablets (12.5 mg total) by mouth 2 (two) times daily.   tolnaftate 1 % cream Commonly known as: Tinactin Apply 1 application topically 2 (two) times daily.        DISCHARGE INSTRUCTIONS:     Follow-up PMD 5 days  If you experience worsening of your admission symptoms, develop shortness of breath, life threatening emergency, suicidal or homicidal thoughts you must seek medical attention immediately by calling 911 or calling your MD immediately  if symptoms less severe.  You Must read complete instructions/literature along with all the possible adverse reactions/side effects for all the Medicines you take and that have been prescribed to you. Take any new Medicines after you have completely understood and accept all the possible adverse reactions/side effects.   Please note  You were cared for by a hospitalist during your hospital stay. If you have any questions about your discharge medications or the care you received while you were in the hospital after you are discharged, you can call the unit and asked to speak with the hospitalist on call if the hospitalist that took care of you is not available. Once you are discharged, your primary care physician will handle any further medical issues. Please note that NO REFILLS for any discharge medications will be authorized once you are discharged, as it is imperative that you return to your primary care physician (or establish a relationship with a primary care physician if you do not have one) for your aftercare needs so that they can reassess your need for medications and monitor your lab values.    Today   CHIEF COMPLAINT:   Chief Complaint  Patient presents with  . Weakness    HISTORY OF PRESENT ILLNESS:  Willine Spragg  is a 53 y.o. female came in with weakness and left lower extremity swelling   VITAL SIGNS:  Blood pressure 114/66, pulse 82, temperature 98 F (36.7 C), temperature source Oral, resp. rate 19, height 5\' 6"  (1.676 m), weight 124.1 kg, last menstrual period 05/19/2015, SpO2 99 %.  I/O:    Intake/Output Summary (Last 24 hours) at 03/10/2019 1423 Last data filed at 03/10/2019 0640 Gross per 24 hour   Intake -  Output 1050 ml  Net -1050 ml    PHYSICAL EXAMINATION:  GENERAL:  53 y.o.-year-old patient lying in the bed with no acute distress.  EYES: Pupils equal, round, reactive to light and accommodation. No scleral icterus. Extraocular muscles intact.  HEENT: Head atraumatic, normocephalic. Oropharynx and nasopharynx clear.  NECK:  Supple, no jugular venous distention. No thyroid enlargement, no tenderness.  LUNGS: Normal breath sounds bilaterally, no wheezing, rales,rhonchi or crepitation. No use of accessory muscles of respiration.  CARDIOVASCULAR: S1, S2 normal. No murmurs, rubs, or gallops.  ABDOMEN: Soft, non-tender, non-distended. Bowel sounds present. No organomegaly or mass.  EXTREMITIES: 2+ pedal edema, no cyanosis, or clubbing.  NEUROLOGIC: Cranial nerves II through XII are intact. Muscle strength 5/5 in all extremities. Sensation intact. Gait not checked.  PSYCHIATRIC: The  patient is alert and oriented x 3.  SKIN: Bilateral lower extremity discoloration left darker than right.  Slight warmth to palpation left lower extremity.  DATA REVIEW:   CBC Recent Labs  Lab 03/10/19 0208  WBC 9.1  HGB 10.9*  HCT 35.2*  PLT 277    Chemistries  Recent Labs  Lab 03/08/19 1350 03/10/19 0208  NA 137 140  K 3.3* 3.2*  CL 105 103  CO2 21* 27  GLUCOSE 114* 88  BUN 17 15  CREATININE 1.33* 0.98  CALCIUM 8.7* 8.5*  AST 28  --   ALT 18  --   ALKPHOS 70  --   BILITOT 1.1  --      Microbiology Results  Results for orders placed or performed during the hospital encounter of 03/08/19  SARS Coronavirus 2 [facility] order, Performed in [facility] hospital lab) Nasopharyngeal Nasopharyngeal Swab     Status: None   Collection Time: 03/08/19  7:54 PM   Specimen: Nasopharyngeal Swab  Result Value Ref Range Status   SARS Coronavirus 2 NEGATIVE NEGATIVE Final    Comment: (NOTE) If result is NEGATIVE SARS-CoV-2 target nucleic acids are NOT DETECTED. The SARS-CoV-2 RNA is  generally detectable in upper and lower  respiratory specimens during the acute phase of infection. The lowest  concentration of SARS-CoV-2 viral copies this assay can detect is 250  copies / mL. A negative result does not preclude SARS-CoV-2 infection  and should not be used as the sole basis for treatment or other  patient management decisions.  A negative result may occur with  improper specimen collection / handling, submission of specimen other  than nasopharyngeal swab, presence of viral mutation(s) within the  areas targeted by this assay, and inadequate number of viral copies  (<250 copies / mL). A negative result must be combined with clinical  observations, patient history, and epidemiological information. If result is POSITIVE SARS-CoV-2 target nucleic acids are DETECTED. The SARS-CoV-2 RNA is generally detectable in upper and lower  respiratory specimens dur ing the acute phase of infection.  Positive  results are indicative of active infection with SARS-CoV-2.  Clinical  correlation with patient history and other diagnostic information is  necessary to determine patient infection status.  Positive results do  not rule out bacterial infection or co-infection with other viruses. If result is PRESUMPTIVE POSTIVE SARS-CoV-2 nucleic acids MAY BE PRESENT.   A presumptive positive result was obtained on the submitted specimen  and confirmed on repeat testing.  While 2019 novel coronavirus  (SARS-CoV-2) nucleic acids may be present in the submitted sample  additional confirmatory testing may be necessary for epidemiological  and / or clinical management purposes  to differentiate between  SARS-CoV-2 and other Sarbecovirus currently known to infect humans.  If clinically indicated additional testing with an alternate test  methodology [phone]) is advised. The SARS-CoV-2 RNA is generally  detectable in upper and lower respiratory sp ecimens during the acute  phase of  infection. The expected result is Negative. Fact Sheet for Patients:  StrictlyIdeas.no Fact Sheet for Healthcare Providers: BankingDealers.co.za This test is not yet approved or cleared by the [place] FDA and has been authorized for detection and/or diagnosis of SARS-CoV-2 by FDA under an Emergency Use Authorization (EUA).  This EUA will remain in effect (meaning this test can be used) for the duration of the COVID-19 declaration under Section 564(b)(1) of the Act, 21 U.S.C. section 360bbb-3(b)(1), unless the authorization is terminated or revoked sooner. Performed at [facility], [address], [place], [place] [address]   Urine Culture     Status: Abnormal   Collection Time: 03/08/19  9:25 PM   Specimen: Urine, Random  Result Value Ref Range Status   Specimen Description   Final    URINE, RANDOM Performed at [facility], [address], [place],  [address]    Special Requests   Final    NONE Performed at [facility], [address] [place],  [address]    Culture MULTIPLE SPECIES PRESENT, SUGGEST RECOLLECTION (A)  Final   Report Status 03/10/2019 FINAL  Final    RADIOLOGY:  Ct Abdomen Pelvis W Contrast  Result Date: 03/08/2019 CLINICAL DATA:  Abdominal pain, fever. EXAM: CT ABDOMEN AND PELVIS WITH CONTRAST TECHNIQUE: Multidetector CT imaging of the abdomen and pelvis was performed using the standard protocol following bolus administration of intravenous contrast. CONTRAST:  170mL OMNIPAQUE IOHEXOL 300 MG/ML  SOLN COMPARISON:  12/27/2018 FINDINGS: Lower chest: No acute abnormality. Hepatobiliary: No focal hepatic abnormality. Gallbladder unremarkable. Pancreas: No focal abnormality or ductal dilatation. Spleen: No focal abnormality.  Normal size. Adrenals/Urinary Tract: No adrenal abnormality. No focal renal abnormality. No stones or hydronephrosis. Urinary bladder is unremarkable.  Stomach/Bowel: Normal appendix. Stomach, large and small bowel grossly unremarkable. Vascular/Lymphatic: No evidence of aneurysm or adenopathy. Reproductive: 3.9 cm exophytic fibroid off the lower anterior uterus. No adnexal mass. Other: No free fluid or free air. Musculoskeletal: No acute bony abnormality. IMPRESSION: No acute findings in the abdomen or pelvis. Uterine fibroid. Electronically Signed   By: Rolm Baptise M.D.   On: 03/08/2019 23:33   US Venous Img Lower Unilateral Left  Result Date: 03/09/2019 CLINICAL DATA:  Initial evaluation for acute left leg swelling AND PAIN. EXAM: Left LOWER EXTREMITY VENOUS DOPPLER ULTRASOUND TECHNIQUE: Gray-scale sonography with graded compression, as well as color Doppler and duplex ultrasound were performed to evaluate the lower extremity deep venous systems from the level of the common femoral vein and including the common femoral, femoral, profunda femoral, popliteal and calf veins including the posterior tibial, peroneal and gastrocnemius veins when visible. The superficial great saphenous vein was also interrogated. Spectral Doppler was utilized to evaluate flow at rest and with distal augmentation maneuvers in the common femoral, femoral and popliteal veins. COMPARISON:  None. FINDINGS: Contralateral Common Femoral Vein: Respiratory phasicity is normal and symmetric with the symptomatic side. No evidence of thrombus. Normal compressibility. Common Femoral Vein: No evidence of thrombus. Normal compressibility, respiratory phasicity and response to augmentation. Saphenofemoral Junction: No evidence of thrombus. Normal compressibility and flow on color Doppler imaging. Profunda Femoral Vein: No evidence of thrombus. Normal compressibility and flow on color Doppler imaging. Femoral Vein: No evidence of thrombus. Normal compressibility, respiratory phasicity and response to augmentation. Popliteal Vein: No evidence of thrombus. Normal compressibility, respiratory  phasicity and response to augmentation. Calf Veins: No evidence of thrombus. Normal compressibility and flow on color Doppler imaging. Superficial Great Saphenous Vein: No evidence of thrombus. Normal compressibility. Venous Reflux:  None. Other Findings:  None. IMPRESSION: No evidence of deep venous thrombosis. Electronically Signed   By: Jeannine Boga M.D.   On: 03/09/2019 01:07   Dg Chest Portable 1 View  Result Date: 03/08/2019 CLINICAL DATA:  Fever, weakness EXAM: PORTABLE CHEST 1 VIEW COMPARISON:  None. FINDINGS: Heart and mediastinal contours are within normal limits. No focal opacities or effusions. No acute bony abnormality. IMPRESSION: No active disease. Electronically Signed   By: Rolm Baptise M.D.  On: 03/08/2019 20:04       Management plans discussed with the patient, family (husband yesterday)  and they are in agreement.  CODE STATUS:     Code Status Orders  (From admission, onward)         Start     Ordered   03/09/19 0204  Full code  Continuous     03/09/19 0203        Code Status History    Date Active Date Inactive Code Status Order ID Comments User Context   12/25/2018 1215 12/29/2018 1827 Full Code [id number]  Lang Snow, NP ED   07/12/2016 0034 07/13/2016 1607 Full Code [id number]  Harvie Bridge, DO ED   07/23/2015 1633 07/27/2015 1800 Full Code [id number]  Lorretta Harp, MD Inpatient   07/23/2015 1435 07/23/2015 1633 Full Code [id number]  Isaiah Serge, NP Inpatient   07/20/2015 1012 07/22/2015 1824 Full Code [id number]  Isaias Cowman, MD Inpatient   Advance Care Planning Activity      TOTAL TIME TAKING CARE OF THIS PATIENT: 34 minutes.    Loletha Grayer M.D on 03/10/2019 at 2:23 PM  Between 7am to 6pm - Pager - [phone]  After 6pm go to [email or web address] - password [email or web address] [facility]  [phone]  CC: Primary care physician; Perrin Maltese, MD

## 2019-03-11 LAB — ECHOCARDIOGRAM COMPLETE
Height: 66 in
Weight: 4377.45 oz

## 2019-03-11 LAB — HIV ANTIBODY (ROUTINE TESTING W REFLEX): HIV Screen 4th Generation wRfx: NONREACTIVE

## 2019-03-21 ENCOUNTER — Other Ambulatory Visit: Payer: Self-pay

## 2019-03-21 ENCOUNTER — Ambulatory Visit (INDEPENDENT_AMBULATORY_CARE_PROVIDER_SITE_OTHER): Payer: Medicare Other | Admitting: Obstetrics and Gynecology

## 2019-03-21 ENCOUNTER — Encounter: Payer: Self-pay | Admitting: Obstetrics and Gynecology

## 2019-03-21 ENCOUNTER — Ambulatory Visit (INDEPENDENT_AMBULATORY_CARE_PROVIDER_SITE_OTHER): Payer: Medicare Other

## 2019-03-21 VITALS — BP 132/84 | Ht 66.0 in | Wt 278.0 lb

## 2019-03-21 DIAGNOSIS — I6529 Occlusion and stenosis of unspecified carotid artery: Secondary | ICD-10-CM | POA: Diagnosis not present

## 2019-03-21 DIAGNOSIS — D252 Subserosal leiomyoma of uterus: Secondary | ICD-10-CM

## 2019-03-21 DIAGNOSIS — D251 Intramural leiomyoma of uterus: Secondary | ICD-10-CM

## 2019-03-21 DIAGNOSIS — N950 Postmenopausal bleeding: Secondary | ICD-10-CM

## 2019-03-21 NOTE — Progress Notes (Signed)
Gynecology Ultrasound Follow Up  Chief Complaint:  Chief Complaint  Patient presents with  . Follow-up  Ultrasound for postmenopausal bleeding   History of Present Illness: Patient is a 53 y.o. female who presents today for ultrasound evaluation of the above .  Ultrasound demonstrates the following findings Adnexa: no masses seen  Uterus: retroverted with endometrial stripe  2.9 mm Additional: two fibroids noted: 1) 3 x 3 x 3 cm subserosal anterior  In the LUS, 2) 1.9 x 1.5 x 1.7 cm intramural and posterior. No free fluid noted.  Pap smear: NILM, HPV negative, negative STD screen  She has had no bleeding since the episode several months back.   Past Medical History:  Diagnosis Date  . Arthritis    hips, right leg  . CAD in native artery, RCA, LAD and LCX 07/23/2015  . Carotid artery occlusion   . H/O myocardial infarction less than 8 weeks- STEMI inf wall 07/20/15- stent to RCA 07/20/15   inf wall STEMI with DES to RCA  . Hypercholesteremia   . Hypertension   . Periorbital edema    treated with steroids  . Seizures ([place])    as teenager, ages [age].  none since  . STEMI (ST elevation myocardial infarction) ([place]) 07/23/15    Past Surgical History:  Procedure Laterality Date  . CARDIAC CATHETERIZATION N/A 07/20/2015   Procedure: Left Heart Cath and Coronary Angiography;  Surgeon: Isaias Cowman, MD;  Location: [place] [facility];  Service: Cardiovascular;  Laterality: N/A;  . CARDIAC CATHETERIZATION N/A 07/20/2015   Procedure: Coronary Stent Intervention;  Surgeon: Isaias Cowman, MD;  Location: [place] [facility];  Service: Cardiovascular;  Laterality: N/A;  . CARDIAC CATHETERIZATION N/A 07/23/2015   Procedure: Left Heart Cath and Coronary Angiography;  Surgeon: Lorretta Harp, MD;  Location: [place] [facility];  Service: Cardiovascular;  Laterality: N/A;  . CARDIAC CATHETERIZATION N/A 07/23/2015   Procedure: Coronary Balloon Angioplasty;  Surgeon: Lorretta Harp, MD;  Location: [place] [facility];  Service: Cardiovascular;  Laterality: N/A;  . CARDIAC CATHETERIZATION N/A 07/12/2016   Procedure: Left Heart Cath;  Surgeon: Teodoro Spray, MD;  Location: [place] [facility];  Service: Cardiovascular;  Laterality: N/A;  . CARDIAC CATHETERIZATION N/A 07/12/2016   Procedure: Left Heart Cath and Coronary Angiography;  Surgeon: Isaias Cowman, MD;  Location: [place] [facility];  Service: Cardiovascular;  Laterality: N/A;  . CARDIAC CATHETERIZATION N/A 07/12/2016   Procedure: Intravascular Pressure Wire/FFR Study;  Surgeon: Isaias Cowman, MD;  Location: [place] [facility];  Service: Cardiovascular;  Laterality: N/A;  . CARDIAC CATHETERIZATION N/A 07/12/2016   Procedure: Coronary Stent Intervention;  Surgeon: Isaias Cowman, MD;  Location: [place] [facility];  Service: Cardiovascular;  Laterality: N/A;  . KNEE SURGERY Right 15 years ago    Family History  Problem Relation Age of Onset  . Breast cancer Sister 48    Social History   Socioeconomic History  . Marital status: Married    Spouse name: Not on file  . Number of children: Not on file  . Years of education: Not on file  . Highest education level: Not on file  Occupational History  . Not on file  Social Needs  . Financial resource strain: Not on file  . Food insecurity    Worry: Not on file    Inability: Not on file  . Transportation needs    Medical: Not on file    Non-medical: Not on file  Tobacco Use  . Smoking status: Never Smoker  . Smokeless tobacco: Former Systems developer    Types: Snuff  Substance and Sexual Activity  . Alcohol use: No  . Drug use: Never  . Sexual activity: Yes    Birth control/protection: None  Lifestyle  . Physical activity    Days per week: Not on file    Minutes per session: Not on file  . Stress: Not on file  Relationships  . Social Herbalist on phone: Not on file    Gets together: Not on file    Attends religious service:  Not on file    Active member of club or organization: Not on file    Attends meetings of clubs or organizations: Not on file    Relationship status: Not on file  . Intimate partner violence    Fear of current or ex partner: Not on file    Emotionally abused: Not on file    Physically abused: Not on file    Forced sexual activity: Not on file  Other Topics Concern  . Not on file  Social History Narrative  . Not on file    Allergies  Allergen Reactions  . Enalapril Swelling    Swelling face    Prior to Admission medications   Medication Sig Start Date End Date Taking? Authorizing Provider  ammonium lactate (LAC-HYDRIN) 12 % cream Apply to feet twice a dat 03/10/19   Loletha Grayer, MD  aspirin EC 81 MG tablet Take 81 mg by mouth daily.    [provider]  atorvastatin (LIPITOR) 80 MG tablet Take 0.5 tablets (40 mg total) by mouth daily at 6 PM. 03/10/19   Loletha Grayer, MD  clopidogrel (PLAVIX) 75 MG tablet Take 1 tablet (75 mg total) by mouth daily. 01/26/16   Tawni Millers, MD  ergocalciferol (VITAMIN D2) 1.25 MG (50000 UT) capsule Take 50,000 Units by mouth every Monday.    [provider]  ferrous sulfate 325 (65 FE) MG tablet Take 325 mg by mouth daily.    [provider]  furosemide (LASIX) 20 MG tablet Take 20 mg by mouth daily.  01/02/19   [provider]  losartan (COZAAR) 50 MG tablet Take 50 mg by mouth daily.    [provider]  metoprolol tartrate (LOPRESSOR) 25 MG tablet Take 0.5 tablets (12.5 mg total) by mouth 2 (two) times daily. 07/13/16   Hillary Bow, MD  tolnaftate (TINACTIN) 1 % cream Apply 1 application topically 2 (two) times daily. 03/10/19   Loletha Grayer, MD    Physical Exam BP 132/84   Ht 5\' 6"  (1.676 m)   Wt 278 lb (126.1 kg)   LMP 05/19/2015 (LMP Unknown)   BMI 44.87 kg/m    General: NAD HEENT: normocephalic, anicteric Pulmonary: No increased work of breathing Extremities: no edema, erythema, or  tenderness Neurologic: Grossly intact, normal gait Psychiatric: mood appropriate, affect full  Imaging Results US Transvaginal Non-ob  Result Date: 03/21/2019 Patient Name: Sarah Medina DOB: 18-Jan-1966 MRN: [id number] ULTRASOUND REPORT Location: [facility] Date of Service: 03/21/2019 Indications:Abnormal Uterine Bleeding Findings: The uterus is anteverted and measures 7.6 x 5.3 x 6.4 cm. Echo texture is heterogenous with evidence of focal masses. Within the uterus are multiple suspected fibroids measuring: Fibroid 1: 30 x 30 x 30 mm subserosal anterior, lower uterine segment Fibroid 2: 19 x 15 x 17 mm intramural posterior The Endometrium measures 2.9 mm. Right Ovary: measures 3.7 x 1.6 x  0.9 cm. It is normal in appearance. Left Ovary: measures 3.0 x 1.2 x 1.2  cm. It is normal in appearance. Survey of the adnexa demonstrates no adnexal masses. There is no free fluid in the cul de sac. Impression: 1. Thin endometrium 2. There are two uterus fibroids seen. 3. Normal ovaries. Gweneth Dimitri, RT The ultrasound images and findings were reviewed by me and I agree with the above report. Prentice Docker, MD, [facility], [facility] 03/21/2019 11:49 AM       Assessment: 53 y.o. G1P0  1. Postmenopausal bleeding      Plan: Problem List Items Addressed This Visit    None    Visit Diagnoses    Postmenopausal bleeding    -  Primary     Reviewed etiologies for postmenopausal bleeding.  Her ultrasound is reassuring today, she has an endometrial stripe of 2.9 mm and no focal masses that encroach upon the endometrial cavity.  She has 2 fibroids one that appears to be subserosal anterior and in the lower uterine segment.  The other fibroid is intramural and probably partially subserosal posteriorly and is small in size.  Neither of these are likely to be causing her bleeding and neither are amenable to sampling hysteroscopicly.  Given her thin endometrial lining today, we  will forego endometrial biopsy at this time.  However, she was encouraged to return to clinic if she has any other episodes and endometrial biopsy would be performed at that time.  If further bleeding occurred even with reassuring endometrial biopsy, hysteroscopic assessment would need to be made.  She voiced understanding of all the above.  All questions were answered.  15 minutes spent in face to face discussion with > 50% spent in counseling,management, and coordination of care of her postmenopausal bleeding.   Prentice Docker, MD, [facility], [facility] 03/21/2019 11:59 AM    CC: Perrin Maltese, MD [address] [place],  [place] [address]

## 2019-05-02 DIAGNOSIS — Z6841 Body Mass Index (BMI) 40.0 and over, adult: Secondary | ICD-10-CM | POA: Insufficient documentation

## 2019-06-25 ENCOUNTER — Emergency Department
Admission: EM | Admit: 2019-06-25 | Discharge: 2019-06-25 | Disposition: A | Discharge status: Home / Self Care | Payer: Medicare Other | Attending: Emergency Medicine | Admitting: Emergency Medicine

## 2019-06-25 ENCOUNTER — Other Ambulatory Visit: Payer: Self-pay

## 2019-06-25 ENCOUNTER — Encounter: Payer: Self-pay | Admitting: Emergency Medicine

## 2019-06-25 DIAGNOSIS — I2510 Atherosclerotic heart disease of native coronary artery without angina pectoris: Secondary | ICD-10-CM | POA: Diagnosis not present

## 2019-06-25 DIAGNOSIS — Z79899 Other long term (current) drug therapy: Secondary | ICD-10-CM | POA: Insufficient documentation

## 2019-06-25 DIAGNOSIS — R109 Unspecified abdominal pain: Secondary | ICD-10-CM | POA: Diagnosis present

## 2019-06-25 DIAGNOSIS — Z7982 Long term (current) use of aspirin: Secondary | ICD-10-CM | POA: Insufficient documentation

## 2019-06-25 DIAGNOSIS — R569 Unspecified convulsions: Secondary | ICD-10-CM | POA: Insufficient documentation

## 2019-06-25 DIAGNOSIS — Z87891 Personal history of nicotine dependence: Secondary | ICD-10-CM | POA: Insufficient documentation

## 2019-06-25 DIAGNOSIS — I10 Essential (primary) hypertension: Secondary | ICD-10-CM | POA: Insufficient documentation

## 2019-06-25 DIAGNOSIS — U071 COVID-19: Secondary | ICD-10-CM | POA: Diagnosis not present

## 2019-06-25 LAB — COMPREHENSIVE METABOLIC PANEL
ALT: 12 U/L (ref 0–44)
AST: 21 U/L (ref 15–41)
Albumin: 4 g/dL (ref 3.5–5.0)
Alkaline Phosphatase: 85 U/L (ref 38–126)
Anion gap: 15 (ref 5–15)
BUN: 16 mg/dL (ref 6–20)
CO2: 23 mmol/L (ref 22–32)
Calcium: 9.1 mg/dL (ref 8.9–10.3)
Chloride: 103 mmol/L (ref 98–111)
Creatinine, Ser: 1.09 mg/dL — ABNORMAL HIGH (ref 0.44–1.00)
GFR calc Af Amer: >60 mL/min (ref >60)
GFR calc non Af Amer: 58 mL/min — ABNORMAL LOW (ref >60)
Glucose, Bld: 106 mg/dL — ABNORMAL HIGH (ref 70–99)
Potassium: 3.9 mmol/L (ref 3.5–5.1)
Sodium: 141 mmol/L (ref 135–145)
Total Bilirubin: 0.6 mg/dL (ref 0.3–1.2)
Total Protein: 8.3 g/dL — ABNORMAL HIGH (ref 6.5–8.1)

## 2019-06-25 LAB — URINALYSIS, COMPLETE (UACMP) WITH MICROSCOPIC
Bacteria, UA: NONE SEEN
Bilirubin Urine: NEGATIVE
Glucose, UA: NEGATIVE mg/dL
Hgb urine dipstick: NEGATIVE
Ketones, ur: 20 mg/dL — AB
Nitrite: NEGATIVE
Protein, ur: 30 mg/dL — AB
Specific Gravity, Urine: 1.027 (ref 1.005–1.030)
pH: 5 (ref 5.0–8.0)

## 2019-06-25 LAB — POC SARS CORONAVIRUS 2 AG: SARS Coronavirus 2 Ag: POSITIVE — AB

## 2019-06-25 LAB — CBC
HCT: 46.4 % — ABNORMAL HIGH (ref 36.0–46.0)
Hemoglobin: 14.4 g/dL (ref 12.0–15.0)
MCH: 25.8 pg — ABNORMAL LOW (ref 26.0–34.0)
MCHC: 31 g/dL (ref 30.0–36.0)
MCV: 83.2 fL (ref 80.0–100.0)
Platelets: 276 10*3/uL (ref 150–400)
RBC: 5.58 MIL/uL — ABNORMAL HIGH (ref 3.87–5.11)
RDW: 16.4 % — ABNORMAL HIGH (ref 11.5–15.5)
WBC: 11 10*3/uL — ABNORMAL HIGH (ref 4.0–10.5)
nRBC: 0 % (ref 0.0–0.2)

## 2019-06-25 LAB — LIPASE, BLOOD: Lipase: 33 U/L (ref 11–51)

## 2019-06-25 MED ORDER — ONDANSETRON 4 MG PO TBDP: 4.0000 mg | ORAL_TABLET | Freq: Three times a day (TID) | ORAL | 0 refills | Status: DC | PRN

## 2019-06-25 NOTE — ED Provider Notes (Signed)
[facility] Emergency Department Provider Note   ____________________________________________    I have reviewed the triage vital signs and the nursing notes.   HISTORY  Chief Complaint Abdominal Pain, Back Pain, and Cough     HPI Sarah Medina is a 53 y.o. female who presents with mild abdominal discomfort, nausea body aches, mild cough.  Husband has had a cough and fever for nearly a week now.  She has not been tested for novel coronavirus.  Currently notes abdominal pain has resolved, she did vomit earlier and felt better after that.  No diarrhea noted.  No shortness of breath.  Is not take anything for this  Past Medical History:  Diagnosis Date  . Arthritis    hips, right leg  . CAD in native artery, RCA, LAD and LCX 07/23/2015  . Carotid artery occlusion   . H/O myocardial infarction less than 8 weeks- STEMI inf wall 07/20/15- stent to RCA 07/20/15   inf wall STEMI with DES to RCA  . Hypercholesteremia   . Hypertension   . Periorbital edema    treated with steroids  . Seizures ([place])    as teenager, ages [age].  none since  . STEMI (ST elevation myocardial infarction) ([place]) 07/23/15    Patient Active Problem List   Diagnosis Date Noted  . Cellulitis of left lower extremity   . UTI (urinary tract infection)   . Leg swelling   . Positive D dimer   . Sepsis ([place]) 12/25/2018  . Chest pain, rule out acute myocardial infarction 07/11/2016  . Ischemic heart disease 10/20/2015  . Essential hypertension 10/20/2015  . Hyperlipidemia 10/20/2015  . Acute posthemorrhagic anemia   . H/O myocardial infarction less than 8 weeks- STEMI inf wall 07/20/15- stent to RCA 07/23/2015  . CAD in native artery, RCA, LAD and LCX 07/23/2015  . Ventricular fibrillation ([place]) 07/23/2015  . ST elevation myocardial infarction (STEMI) of inferior wall ([place]) 07/20/2015  . Abnormal LFTs 12/23/2014  . Osteoarthritis of right knee 11/24/2014    Past Surgical  History:  Procedure Laterality Date  . CARDIAC CATHETERIZATION N/A 07/20/2015   Procedure: Left Heart Cath and Coronary Angiography;  Surgeon: Isaias Cowman, MD;  Location: [place] CV LAB;  Service: Cardiovascular;  Laterality: N/A;  . CARDIAC CATHETERIZATION N/A 07/20/2015   Procedure: Coronary Stent Intervention;  Surgeon: Isaias Cowman, MD;  Location: [place] CV LAB;  Service: Cardiovascular;  Laterality: N/A;  . CARDIAC CATHETERIZATION N/A 07/23/2015   Procedure: Left Heart Cath and Coronary Angiography;  Surgeon: Lorretta Harp, MD;  Location: [place] CV LAB;  Service: Cardiovascular;  Laterality: N/A;  . CARDIAC CATHETERIZATION N/A 07/23/2015   Procedure: Coronary Balloon Angioplasty;  Surgeon: Lorretta Harp, MD;  Location: [place] CV LAB;  Service: Cardiovascular;  Laterality: N/A;  . CARDIAC CATHETERIZATION N/A 07/12/2016   Procedure: Left Heart Cath;  Surgeon: Teodoro Spray, MD;  Location: [place] CV LAB;  Service: Cardiovascular;  Laterality: N/A;  . CARDIAC CATHETERIZATION N/A 07/12/2016   Procedure: Left Heart Cath and Coronary Angiography;  Surgeon: Isaias Cowman, MD;  Location: [place] CV LAB;  Service: Cardiovascular;  Laterality: N/A;  . CARDIAC CATHETERIZATION N/A 07/12/2016   Procedure: Intravascular Pressure Wire/FFR Study;  Surgeon: Isaias Cowman, MD;  Location: [place] CV LAB;  Service: Cardiovascular;  Laterality: N/A;  . CARDIAC CATHETERIZATION N/A 07/12/2016   Procedure: Coronary Stent Intervention;  Surgeon: Isaias Cowman, MD;  Location: [place] CV LAB;  Service: Cardiovascular;  Laterality: N/A;  . KNEE SURGERY Right 15 years ago    Prior to Admission medications   Medication Sig Start Date End Date Taking? Authorizing Provider  ammonium lactate (LAC-HYDRIN) 12 % cream Apply to feet twice a dat 03/10/19   Loletha Grayer, MD  aspirin EC 81 MG tablet Take 81 mg by mouth daily.    [provider]  atorvastatin (LIPITOR) 80 MG tablet Take 0.5 tablets (40 mg total) by mouth daily at 6 PM. 03/10/19   Loletha Grayer, MD  clopidogrel (PLAVIX) 75 MG tablet Take 1 tablet (75 mg total) by mouth daily. 01/26/16   Tawni Millers, MD  ergocalciferol (VITAMIN D2) 1.25 MG (50000 UT) capsule Take 50,000 Units by mouth every Monday.    [provider]  ferrous sulfate 325 (65 FE) MG tablet Take 325 mg by mouth daily.    [provider]  furosemide (LASIX) 20 MG tablet Take 20 mg by mouth daily.  01/02/19   [provider]  losartan (COZAAR) 50 MG tablet Take 50 mg by mouth daily.    [provider]  metoprolol tartrate (LOPRESSOR) 25 MG tablet Take 0.5 tablets (12.5 mg total) by mouth 2 (two) times daily. 07/13/16   Hillary Bow, MD  ondansetron (ZOFRAN ODT) 4 MG disintegrating tablet Take 1 tablet (4 mg total) by mouth every 8 (eight) hours as needed. 06/25/19   Lavonia Drafts, MD  tolnaftate (TINACTIN) 1 % cream Apply 1 application topically 2 (two) times daily. 03/10/19   Loletha Grayer, MD     Allergies Enalapril  Family History  Problem Relation Age of Onset  . Breast cancer Sister 70    Social History Social History   Tobacco Use  . Smoking status: Never Smoker  . Smokeless tobacco: Former Systems developer    Types: Snuff  Substance Use Topics  . Alcohol use: No  . Drug use: Never    Review of Systems  Constitutional: No fever/chills Eyes: No visual changes.  ENT: No sore throat. Cardiovascular: Denies chest pain. Respiratory: Denies shortness of breath.  Mild cough Gastrointestinal: As above Genitourinary: Negative for dysuria. Musculoskeletal: Some body aches Skin: Negative for rash. Neurological: Negative for headaches or weakness   ____________________________________________   PHYSICAL EXAM:  VITAL SIGNS: ED Triage Vitals  Enc Vitals Group     BP 06/25/19 1210 118/80     Pulse Rate 06/25/19 1210 (!) 112     Resp 06/25/19  1210 16     Temp 06/25/19 1210 98.6 F (37 C)     Temp Source 06/25/19 1210 Oral     SpO2 06/25/19 1210 97 %     Weight 06/25/19 1211 120.7 kg (266 lb)     Height 06/25/19 1211 1.676 m (5\' 6" )     Head Circumference --      Peak Flow --      Pain Score 06/25/19 1211 7     Pain Loc --      Pain Edu? --      Excl. in [place]? --     Constitutional: Alert and oriented.  Nose: No congestion/rhinnorhea. Mouth/Throat: Mucous membranes are moist.    Cardiovascular: Normal rate, regular rhythm.  Good peripheral circulation. Respiratory: Normal respiratory effort.  No retractions Gastrointestinal: Soft and nontender. No distention.  No CVA tenderness.  Reassuring exam  Musculoskeletal: No lower extremity tenderness nor edema.  Warm and well perfused Neurologic:  Normal speech and language. No gross focal neurologic deficits are  appreciated.  Skin:  Skin is warm, dry and intact. No rash noted. Psychiatric: Mood and affect are normal. Speech and behavior are normal.  ____________________________________________   LABS (all labs ordered are listed, but only abnormal results are displayed)  Labs Reviewed  COMPREHENSIVE METABOLIC PANEL - Abnormal; Notable for the following components:      Result Value   Glucose, Bld 106 (*)    Creatinine, Ser 1.09 (*)    Total Protein 8.3 (*)    GFR calc non Af Amer 58 (*)    All other components within normal limits  CBC - Abnormal; Notable for the following components:   WBC 11.0 (*)    RBC 5.58 (*)    HCT 46.4 (*)    MCH 25.8 (*)    RDW 16.4 (*)    All other components within normal limits  URINALYSIS, COMPLETE (UACMP) WITH MICROSCOPIC - Abnormal; Notable for the following components:   Color, Urine YELLOW (*)    APPearance CLOUDY (*)    Ketones, ur 20 (*)    Protein, ur 30 (*)    Leukocytes,Ua MODERATE (*)    All other components within normal limits  POC SARS CORONAVIRUS 2 AG - Abnormal; Notable for the following components:   SARS  Coronavirus 2 Ag POSITIVE (*)    All other components within normal limits  LIPASE, BLOOD  POC SARS CORONAVIRUS 2 AG -  ED   ____________________________________________  EKG  None ____________________________________________  RADIOLOGY  None ____________________________________________   PROCEDURES  Procedure(s) performed: No  Procedures   Critical Care performed: No ____________________________________________   INITIAL IMPRESSION / ASSESSMENT AND PLAN / ED COURSE  Pertinent labs & imaging results that were available during my care of the patient were reviewed by me and considered in my medical decision making (see chart for details).  Patient presents with mild nausea vague abdominal discomfort, given her husband's sickness, suspicious for novel coronavirus.  Lab work is overall reassuring.    POC Covid swab is positive as expected.  Will Rx Zofran for symptom relief, supportive care, return precautions discussed    ____________________________________________   FINAL CLINICAL IMPRESSION(S) / ED DIAGNOSES  Final diagnoses:  COVID-19        Note:  This document was prepared using Dragon voice recognition software and may include unintentional dictation errors.   Lavonia Drafts, MD 06/25/19 1344

## 2019-06-25 NOTE — ED Triage Notes (Signed)
Says feeling bad since Saturday or Sunday with dysuria, low back pain, slight cough.  Denies fever.  No vomiting.

## 2019-06-26 ENCOUNTER — Telehealth: Payer: Self-pay | Admitting: Unknown Physician Specialty

## 2019-06-26 NOTE — Telephone Encounter (Signed)
Discussed with patient about Covid symptoms and the use of bamlanivimab, a monoclonal antibody infusion for those with mild to moderate Covid symptoms and at a high risk of hospitalization.  Pt is not qualified for this infusion as symptom onset > 10 days when infusion can be scheduled.    Quarantine information reviewed.

## 2019-06-26 NOTE — Telephone Encounter (Signed)
  I connected by phone with Sarah Medina on 06/26/2019 at 9:31 AM to discuss the potential use of an new treatment for mild to moderate COVID-19 viral infection in non-hospitalized patients.  Pt has will have symptoms greater than 10 days at the time of infusion

## 2019-06-29 DIAGNOSIS — R630 Anorexia: Secondary | ICD-10-CM | POA: Diagnosis present

## 2019-06-29 DIAGNOSIS — I10 Essential (primary) hypertension: Secondary | ICD-10-CM | POA: Insufficient documentation

## 2019-06-29 DIAGNOSIS — R110 Nausea: Secondary | ICD-10-CM | POA: Insufficient documentation

## 2019-06-29 DIAGNOSIS — I259 Chronic ischemic heart disease, unspecified: Secondary | ICD-10-CM | POA: Diagnosis not present

## 2019-06-29 DIAGNOSIS — I252 Old myocardial infarction: Secondary | ICD-10-CM | POA: Diagnosis not present

## 2019-06-29 DIAGNOSIS — E860 Dehydration: Secondary | ICD-10-CM | POA: Insufficient documentation

## 2019-06-29 DIAGNOSIS — Z87891 Personal history of nicotine dependence: Secondary | ICD-10-CM | POA: Diagnosis not present

## 2019-06-29 DIAGNOSIS — Z7902 Long term (current) use of antithrombotics/antiplatelets: Secondary | ICD-10-CM | POA: Insufficient documentation

## 2019-06-29 DIAGNOSIS — U071 COVID-19: Secondary | ICD-10-CM | POA: Insufficient documentation

## 2019-06-29 DIAGNOSIS — Z79899 Other long term (current) drug therapy: Secondary | ICD-10-CM | POA: Diagnosis not present

## 2019-06-29 DIAGNOSIS — Z7982 Long term (current) use of aspirin: Secondary | ICD-10-CM | POA: Insufficient documentation

## 2019-06-29 DIAGNOSIS — N3090 Cystitis, unspecified without hematuria: Secondary | ICD-10-CM | POA: Diagnosis not present

## 2019-06-29 LAB — COMPREHENSIVE METABOLIC PANEL
ALT: 13 U/L (ref 0–44)
AST: 27 U/L (ref 15–41)
Albumin: 3.7 g/dL (ref 3.5–5.0)
Alkaline Phosphatase: 70 U/L (ref 38–126)
Anion gap: 13 (ref 5–15)
BUN: 18 mg/dL (ref 6–20)
CO2: 22 mmol/L (ref 22–32)
Calcium: 9 mg/dL (ref 8.9–10.3)
Chloride: 105 mmol/L (ref 98–111)
Creatinine, Ser: 1.2 mg/dL — ABNORMAL HIGH (ref 0.44–1.00)
GFR calc Af Amer: 59 mL/min — ABNORMAL LOW (ref >60)
GFR calc non Af Amer: 51 mL/min — ABNORMAL LOW (ref >60)
Glucose, Bld: 108 mg/dL — ABNORMAL HIGH (ref 70–99)
Potassium: 3.6 mmol/L (ref 3.5–5.1)
Sodium: 140 mmol/L (ref 135–145)
Total Bilirubin: 0.9 mg/dL (ref 0.3–1.2)
Total Protein: 8 g/dL (ref 6.5–8.1)

## 2019-06-29 LAB — CBC
HCT: 42.5 % (ref 36.0–46.0)
Hemoglobin: 13.8 g/dL (ref 12.0–15.0)
MCH: 25.7 pg — ABNORMAL LOW (ref 26.0–34.0)
MCHC: 32.5 g/dL (ref 30.0–36.0)
MCV: 79.1 fL — ABNORMAL LOW (ref 80.0–100.0)
Platelets: 267 10*3/uL (ref 150–400)
RBC: 5.37 MIL/uL — ABNORMAL HIGH (ref 3.87–5.11)
RDW: 16.1 % — ABNORMAL HIGH (ref 11.5–15.5)
WBC: 7.4 10*3/uL (ref 4.0–10.5)
nRBC: 0 % (ref 0.0–0.2)

## 2019-06-29 LAB — TROPONIN I (HIGH SENSITIVITY): Troponin I (High Sensitivity): 9 ng/L (ref <18)

## 2019-06-29 NOTE — ED Triage Notes (Signed)
Patient with corona virus presents to the ED stating her appetite is decreasing. States her husband was "just brought in here by ambulance because he can't breathe." She denies [facility] and states her appetite is decreasing and she is feeling weak. Denies CP.

## 2019-06-30 ENCOUNTER — Emergency Department
Admission: EM | Admit: 2019-06-30 | Discharge: 2019-06-30 | Disposition: A | Discharge status: Home / Self Care | Payer: Medicare Other | Attending: Emergency Medicine | Admitting: Emergency Medicine

## 2019-06-30 ENCOUNTER — Emergency Department: Payer: Medicare Other

## 2019-06-30 DIAGNOSIS — N390 Urinary tract infection, site not specified: Secondary | ICD-10-CM

## 2019-06-30 DIAGNOSIS — U071 COVID-19: Secondary | ICD-10-CM

## 2019-06-30 DIAGNOSIS — R110 Nausea: Secondary | ICD-10-CM

## 2019-06-30 DIAGNOSIS — E860 Dehydration: Secondary | ICD-10-CM

## 2019-06-30 DIAGNOSIS — R630 Anorexia: Secondary | ICD-10-CM

## 2019-06-30 LAB — URINALYSIS, COMPLETE (UACMP) WITH MICROSCOPIC
Bilirubin Urine: NEGATIVE
Glucose, UA: NEGATIVE mg/dL
Hgb urine dipstick: NEGATIVE
Ketones, ur: 20 mg/dL — AB
Nitrite: NEGATIVE
Protein, ur: 100 mg/dL — AB
Specific Gravity, Urine: 1.021 (ref 1.005–1.030)
pH: 5 (ref 5.0–8.0)

## 2019-06-30 LAB — LIPASE, BLOOD: Lipase: 45 U/L (ref 11–51)

## 2019-06-30 LAB — TROPONIN I (HIGH SENSITIVITY): Troponin I (High Sensitivity): 11 ng/L (ref <18)

## 2019-06-30 MED ORDER — ONDANSETRON HCL 4 MG/2ML IJ SOLN
4.0000 mg | Freq: Once | INTRAMUSCULAR | Status: AC
  Administered 2019-06-30: 4 mg via INTRAVENOUS
  Filled 2019-06-30: qty 2

## 2019-06-30 MED ORDER — CEPHALEXIN 500 MG PO CAPS: 500.0000 mg | ORAL_CAPSULE | Freq: Three times a day (TID) | ORAL | 0 refills | Status: DC

## 2019-06-30 MED ORDER — SODIUM CHLORIDE 0.9 % IV SOLN
1.0000 g | Freq: Once | INTRAVENOUS | Status: AC
  Administered 2019-06-30: 1 g via INTRAVENOUS
  Filled 2019-06-30: qty 10

## 2019-06-30 MED ORDER — SODIUM CHLORIDE 0.9 % IV BOLUS
1000.0000 mL | Freq: Once | INTRAVENOUS | Status: AC
  Administered 2019-06-30: 1000 mL via INTRAVENOUS

## 2019-06-30 NOTE — ED Notes (Signed)
Patient ambulatory to use restroom. Steady gait. NAD noted at this time.

## 2019-06-30 NOTE — ED Notes (Signed)
Pt states she will call someone to come and pick her up

## 2019-06-30 NOTE — ED Provider Notes (Signed)
[facility] Emergency Department Provider Note   ____________________________________________   First MD Initiated Contact with Patient 06/30/19 0246     (approximate)  I have reviewed the triage vital signs and the nursing notes.   HISTORY  Chief Complaint Anorexia (Positive Covid as of 12/23)    HPI Sarah Medina is a 53 y.o. female who returns to the ED from home with a chief complaint of nausea and decreased oral intake.  Patient tested positive for COVID-19 on 12/23.  Complains of cough, generalized weakness, nausea, diarrhea and decreased oral intake.  Spouse is also Covid positive.  Denies fever, chest pain, shortness of breath, abdominal pain, vomiting.       Past Medical History:  Diagnosis Date  . Arthritis    hips, right leg  . CAD in native artery, RCA, LAD and LCX 07/23/2015  . Carotid artery occlusion   . H/O myocardial infarction less than 8 weeks- STEMI inf wall 07/20/15- stent to RCA 07/20/15   inf wall STEMI with DES to RCA  . Hypercholesteremia   . Hypertension   . Periorbital edema    treated with steroids  . Seizures ([place])    as teenager, ages [age].  none since  . STEMI (ST elevation myocardial infarction) ([place]) 07/23/15    Patient Active Problem List   Diagnosis Date Noted  . Cellulitis of left lower extremity   . UTI (urinary tract infection)   . Leg swelling   . Positive D dimer   . Sepsis ([place]) 12/25/2018  . Chest pain, rule out acute myocardial infarction 07/11/2016  . Ischemic heart disease 10/20/2015  . Essential hypertension 10/20/2015  . Hyperlipidemia 10/20/2015  . Acute posthemorrhagic anemia   . H/O myocardial infarction less than 8 weeks- STEMI inf wall 07/20/15- stent to RCA 07/23/2015  . CAD in native artery, RCA, LAD and LCX 07/23/2015  . Ventricular fibrillation ([place]) 07/23/2015  . ST elevation myocardial infarction (STEMI) of inferior wall ([place]) 07/20/2015  . Abnormal LFTs 12/23/2014  .  Osteoarthritis of right knee 11/24/2014    Past Surgical History:  Procedure Laterality Date  . CARDIAC CATHETERIZATION N/A 07/20/2015   Procedure: Left Heart Cath and Coronary Angiography;  Surgeon: Isaias Cowman, MD;  Location: [place] CV LAB;  Service: Cardiovascular;  Laterality: N/A;  . CARDIAC CATHETERIZATION N/A 07/20/2015   Procedure: Coronary Stent Intervention;  Surgeon: Isaias Cowman, MD;  Location:  CV LAB;  Service: Cardiovascular;  Laterality: N/A;  . CARDIAC CATHETERIZATION N/A 07/23/2015   Procedure: Left Heart Cath and Coronary Angiography;  Surgeon: Lorretta Harp, MD;  Location: [place] CV LAB;  Service: Cardiovascular;  Laterality: N/A;  . CARDIAC CATHETERIZATION N/A 07/23/2015   Procedure: Coronary Balloon Angioplasty;  Surgeon: Lorretta Harp, MD;  Location: [place] CV LAB;  Service: Cardiovascular;  Laterality: N/A;  . CARDIAC CATHETERIZATION N/A 07/12/2016   Procedure: Left Heart Cath;  Surgeon: Teodoro Spray, MD;  Location: [place] CV LAB;  Service: Cardiovascular;  Laterality: N/A;  . CARDIAC CATHETERIZATION N/A 07/12/2016   Procedure: Left Heart Cath and Coronary Angiography;  Surgeon: Isaias Cowman, MD;  Location: [place] CV LAB;  Service: Cardiovascular;  Laterality: N/A;  . CARDIAC CATHETERIZATION N/A 07/12/2016   Procedure: Intravascular Pressure Wire/FFR Study;  Surgeon: Isaias Cowman, MD;  Location: [place] CV LAB;  Service: Cardiovascular;  Laterality: N/A;  . CARDIAC CATHETERIZATION N/A 07/12/2016   Procedure: Coronary Stent Intervention;  Surgeon: Isaias Cowman, MD;  Location:  [place] CV LAB;  Service: Cardiovascular;  Laterality: N/A;  . KNEE SURGERY Right 15 years ago    Prior to Admission medications   Medication Sig Start Date End Date Taking? Authorizing Provider  ammonium lactate (LAC-HYDRIN) 12 % cream Apply to feet twice a dat 03/10/19   Loletha Grayer, MD  aspirin EC  81 MG tablet Take 81 mg by mouth daily.    [provider]  atorvastatin (LIPITOR) 80 MG tablet Take 0.5 tablets (40 mg total) by mouth daily at 6 PM. 03/10/19   Leslye Peer, Richard, MD  cephALEXin (KEFLEX) 500 MG capsule Take 1 capsule (500 mg total) by mouth 3 (three) times daily. 06/30/19   Paulette Blanch, MD  clopidogrel (PLAVIX) 75 MG tablet Take 1 tablet (75 mg total) by mouth daily. 01/26/16   Tawni Millers, MD  ergocalciferol (VITAMIN D2) 1.25 MG (50000 UT) capsule Take 50,000 Units by mouth every Monday.    [provider]  ferrous sulfate 325 (65 FE) MG tablet Take 325 mg by mouth daily.    [provider]  furosemide (LASIX) 20 MG tablet Take 20 mg by mouth daily.  01/02/19   [provider]  losartan (COZAAR) 50 MG tablet Take 50 mg by mouth daily.    [provider]  metoprolol tartrate (LOPRESSOR) 25 MG tablet Take 0.5 tablets (12.5 mg total) by mouth 2 (two) times daily. 07/13/16   Hillary Bow, MD  ondansetron (ZOFRAN ODT) 4 MG disintegrating tablet Take 1 tablet (4 mg total) by mouth every 8 (eight) hours as needed. 06/25/19   Lavonia Drafts, MD  tolnaftate (TINACTIN) 1 % cream Apply 1 application topically 2 (two) times daily. 03/10/19   Loletha Grayer, MD    Allergies Enalapril  Family History  Problem Relation Age of Onset  . Breast cancer Sister 22    Social History Social History   Tobacco Use  . Smoking status: Never Smoker  . Smokeless tobacco: Former Systems developer    Types: Snuff  Substance Use Topics  . Alcohol use: No  . Drug use: Never    Review of Systems  Constitutional: Positive for generalized weakness.  No fever/chills Eyes: No visual changes. ENT: No sore throat. Cardiovascular: Denies chest pain. Respiratory: Denies shortness of breath. Gastrointestinal: No abdominal pain.  Positive for nausea, no vomiting.  Positive for diarrhea.  No constipation. Genitourinary: Negative for dysuria. Musculoskeletal: Negative  for back pain. Skin: Negative for rash. Neurological: Negative for headaches, focal weakness or numbness.   ____________________________________________   PHYSICAL EXAM:  VITAL SIGNS: ED Triage Vitals  Enc Vitals Group     BP 06/29/19 2219 114/64     Pulse Rate 06/29/19 2219 84     Resp 06/29/19 2219 18     Temp 06/29/19 2219 97.8 F (36.6 C)     Temp Source 06/29/19 2219 Oral     SpO2 06/30/19 0029 94 %     Weight 06/29/19 2219 242 lb (109.8 kg)     Height 06/29/19 2219 5\' 6"  (1.676 m)     Head Circumference --      Peak Flow --      Pain Score 06/29/19 2219 0     Pain Loc --      Pain Edu? --      Excl. in [place]? --     Constitutional: Alert and oriented. Well appearing and in mild acute distress. Eyes: Conjunctivae are normal. PERRL. EOMI. Head: Atraumatic. Nose: No congestion/rhinnorhea. Mouth/Throat: Mucous  membranes are mildly dry.  Oropharynx non-erythematous. Neck: No stridor.   Cardiovascular: Normal rate, regular rhythm. Grossly normal heart sounds.  Good peripheral circulation. Respiratory: Normal respiratory effort.  No retractions. Lungs CTAB. Gastrointestinal: Soft and nontender to light or deep palpation. No distention. No abdominal bruits. No CVA tenderness. Musculoskeletal: No lower extremity tenderness nor edema.  No joint effusions. Neurologic:  Normal speech and language. No gross focal neurologic deficits are appreciated.  Skin:  Skin is warm, dry and intact. No rash noted.  No petechiae. Psychiatric: Mood and affect are normal. Speech and behavior are normal.  ____________________________________________   LABS (all labs ordered are listed, but only abnormal results are displayed)  Labs Reviewed  CBC - Abnormal; Notable for the following components:      Result Value   RBC 5.37 (*)    MCV 79.1 (*)    MCH 25.7 (*)    RDW 16.1 (*)    All other components within normal limits  COMPREHENSIVE METABOLIC PANEL - Abnormal; Notable for the following  components:   Glucose, Bld 108 (*)    Creatinine, Ser 1.20 (*)    GFR calc non Af Amer 51 (*)    GFR calc Af Amer 59 (*)    All other components within normal limits  URINALYSIS, COMPLETE (UACMP) WITH MICROSCOPIC - Abnormal; Notable for the following components:   Color, Urine AMBER (*)    APPearance CLOUDY (*)    Ketones, ur 20 (*)    Protein, ur 100 (*)    Leukocytes,Ua LARGE (*)    Bacteria, UA RARE (*)    All other components within normal limits  URINE CULTURE  LIPASE, BLOOD  TROPONIN I (HIGH SENSITIVITY)  TROPONIN I (HIGH SENSITIVITY)   ____________________________________________  EKG  ED ECG REPORT I, Shanicka Oldenkamp J, the attending physician, personally viewed and interpreted this ECG.   Date: 06/30/2019  EKG Time: 2020  Rate: 84  Rhythm: normal EKG, normal sinus rhythm  Axis: Normal  Intervals:none  ST&T Change: Nonspecific  ____________________________________________  RADIOLOGY  ED MD interpretation: No pneumonia  Official radiology report(s): No results found.  ____________________________________________   PROCEDURES  Procedure(s) performed (including Critical Care):  Procedures   ____________________________________________   INITIAL IMPRESSION / ASSESSMENT AND PLAN / ED COURSE  As part of my medical decision making, I reviewed the following data within the [place] notes reviewed and incorporated, Labs reviewed, EKG interpreted, Old chart reviewed, Radiograph reviewed and Notes from prior ED visits     Sarah Medina was evaluated in Emergency Department on 07/02/2019 for the symptoms described in the history of present illness. She was evaluated in the context of the global COVID-19 pandemic, which necessitated consideration that the patient might be at risk for infection with the SARS-CoV-2 virus that causes COVID-19. Institutional protocols and algorithms that pertain to the evaluation of patients at risk for  COVID-19 are in a state of rapid change based on information released by regulatory bodies including the CDC and federal and state organizations. These policies and algorithms were followed during the patient's care in the ED.    53 year old female with COVID-19 who presents with generalized weakness, nausea and decreased oral intake.  Differential diagnosis includes but is not limited to dehydration, pneumonia, UTI, etc.  Review of patient's old charts reveals patient had moderate leukocytes on urine on 12/23.  Patient denies being on antibiotics.  Will recheck UA, initiate IV fluid resuscitation, 4 mg Zofran for nausea and reassess.  Patient without complaints. Will discharge home after IV antibiotics. Discharge home on Keflex and patient will follow up with her PCP closely. Strict return precautions given. Patient verbalizes understanding and agrees with plan of care.      ____________________________________________   FINAL CLINICAL IMPRESSION(S) / ED DIAGNOSES  Final diagnoses:  COVID-19  Nausea  Dehydration  Urinary tract infection without hematuria, site unspecified     ED Discharge Orders         Ordered    cephALEXin (KEFLEX) 500 MG capsule  3 times daily     06/30/19 [id number]           Note:  This document was prepared using Dragon voice recognition software and may include unintentional dictation errors.   Paulette Blanch, MD 07/02/19 [phone]

## 2019-06-30 NOTE — Discharge Instructions (Signed)
1.  Take antibiotic as prescribed (Keflex 500 mg 3 times daily x7 days). 2.  You may take the nausea medicine you were previously prescribed. 3.  Drink plenty of fluids daily. 4.  Return to the ER for worsening symptoms, persistent vomiting, difficulty breathing or other concerns.

## 2019-06-30 NOTE — ED Notes (Signed)
Pt placed on 2L [place] due to 89-90% O2 sats. Pt O2 sats increased to 99% on 2L

## 2019-06-30 NOTE — ED Notes (Signed)
Pt alert and oriented X 4, stable for discharge. RR even and unlabored, color WNL. Discussed discharge instructions and follow up when appropriate. Instructed to follow up with ER for any life threatening symptoms or concerns that patient or family of patient may have  

## 2019-06-30 NOTE — ED Notes (Signed)
Pt up to toilet at this time w/o difficulty. Pt removed from 2L [place]. O2 sats remain at or above 93%

## 2019-06-30 NOTE — ED Notes (Signed)
Pt informed to call out for this RN when her ride arrives

## 2019-08-08 ENCOUNTER — Other Ambulatory Visit: Payer: Self-pay

## 2019-08-08 ENCOUNTER — Ambulatory Visit (INDEPENDENT_AMBULATORY_CARE_PROVIDER_SITE_OTHER): Payer: Medicare Other

## 2019-08-08 ENCOUNTER — Ambulatory Visit (INDEPENDENT_AMBULATORY_CARE_PROVIDER_SITE_OTHER): Payer: Medicare Other | Admitting: Vascular Surgery

## 2019-08-08 ENCOUNTER — Encounter (INDEPENDENT_AMBULATORY_CARE_PROVIDER_SITE_OTHER): Payer: Self-pay | Admitting: Vascular Surgery

## 2019-08-08 ENCOUNTER — Encounter (INDEPENDENT_AMBULATORY_CARE_PROVIDER_SITE_OTHER): Payer: Self-pay

## 2019-08-08 VITALS — BP 156/76 | HR 69 | Resp 16 | Wt 252.4 lb

## 2019-08-08 DIAGNOSIS — M79609 Pain in unspecified limb: Secondary | ICD-10-CM | POA: Insufficient documentation

## 2019-08-08 DIAGNOSIS — I10 Essential (primary) hypertension: Secondary | ICD-10-CM

## 2019-08-08 DIAGNOSIS — M79604 Pain in right leg: Secondary | ICD-10-CM

## 2019-08-08 DIAGNOSIS — I739 Peripheral vascular disease, unspecified: Secondary | ICD-10-CM

## 2019-08-08 DIAGNOSIS — M79605 Pain in left leg: Secondary | ICD-10-CM | POA: Diagnosis not present

## 2019-08-08 DIAGNOSIS — N950 Postmenopausal bleeding: Secondary | ICD-10-CM | POA: Insufficient documentation

## 2019-08-08 NOTE — Progress Notes (Signed)
MRN : [id number]  Sarah Medina is a 54 y.o. (01/30/1966) female who presents with chief complaint of  Chief Complaint  Patient presents with  . Follow-up    ultrasound follow up  .  History of Present Illness: Patient returns today in follow up of her legs.  Her swelling and pain is better than her last visit.  She is really not having much issues with her legs at this point.  Noninvasive studies today show normal ABIs of 1.07 on the right and 1.02 on the left with triphasic waveforms.  Current Outpatient Medications  Medication Sig Dispense Refill  . aspirin EC 81 MG tablet Take 81 mg by mouth daily.    Marland Kitchen atorvastatin (LIPITOR) 80 MG tablet Take 0.5 tablets (40 mg total) by mouth daily at 6 PM.    . clopidogrel (PLAVIX) 75 MG tablet Take 1 tablet (75 mg total) by mouth daily. 30 tablet 3  . ergocalciferol (VITAMIN D2) 1.25 MG (50000 UT) capsule Take 50,000 Units by mouth every Monday.    . ferrous sulfate 325 (65 FE) MG tablet Take 325 mg by mouth daily.    . furosemide (LASIX) 20 MG tablet Take 20 mg by mouth daily.     Marland Kitchen losartan (COZAAR) 50 MG tablet Take 50 mg by mouth daily.    . metoprolol tartrate (LOPRESSOR) 25 MG tablet Take 0.5 tablets (12.5 mg total) by mouth 2 (two) times daily. 180 tablet 0  . ondansetron (ZOFRAN ODT) 4 MG disintegrating tablet Take 1 tablet (4 mg total) by mouth every 8 (eight) hours as needed. 20 tablet 0  . ammonium lactate (LAC-HYDRIN) 12 % cream Apply to feet twice a dat (Patient not taking: Reported on 08/08/2019) 385 g 0  . cephALEXin (KEFLEX) 500 MG capsule Take 1 capsule (500 mg total) by mouth 3 (three) times daily. (Patient not taking: Reported on 08/08/2019) 21 capsule 0  . tolnaftate (TINACTIN) 1 % cream Apply 1 application topically 2 (two) times daily. (Patient not taking: Reported on 08/08/2019) 113 g 0   No current facility-administered medications for this visit.    Past Medical History:  Diagnosis Date  . Arthritis    hips, right leg   . CAD in native artery, RCA, LAD and LCX 07/23/2015  . Carotid artery occlusion   . H/O myocardial infarction less than 8 weeks- STEMI inf wall 07/20/15- stent to RCA 07/20/15   inf wall STEMI with DES to RCA  . Hypercholesteremia   . Hypertension   . Periorbital edema    treated with steroids  . Seizures ([place])    as teenager, ages [age].  none since  . STEMI (ST elevation myocardial infarction) ([place]) 07/23/15    Past Surgical History:  Procedure Laterality Date  . CARDIAC CATHETERIZATION N/A 07/20/2015   Procedure: Left Heart Cath and Coronary Angiography;  Surgeon: Isaias Cowman, MD;  Location: [place] [facility];  Service: Cardiovascular;  Laterality: N/A;  . CARDIAC CATHETERIZATION N/A 07/20/2015   Procedure: Coronary Stent Intervention;  Surgeon: Isaias Cowman, MD;  Location: [place] [facility];  Service: Cardiovascular;  Laterality: N/A;  . CARDIAC CATHETERIZATION N/A 07/23/2015   Procedure: Left Heart Cath and Coronary Angiography;  Surgeon: Lorretta Harp, MD;  Location: [place] [facility];  Service: Cardiovascular;  Laterality: N/A;  . CARDIAC CATHETERIZATION N/A 07/23/2015   Procedure: Coronary Balloon Angioplasty;  Surgeon: Lorretta Harp, MD;  Location: [place] [facility];  Service: Cardiovascular;  Laterality: N/A;  .  CARDIAC CATHETERIZATION N/A 07/12/2016   Procedure: Left Heart Cath;  Surgeon: Teodoro Spray, MD;  Location: [place] [facility];  Service: Cardiovascular;  Laterality: N/A;  . CARDIAC CATHETERIZATION N/A 07/12/2016   Procedure: Left Heart Cath and Coronary Angiography;  Surgeon: Isaias Cowman, MD;  Location: [place] [facility];  Service: Cardiovascular;  Laterality: N/A;  . CARDIAC CATHETERIZATION N/A 07/12/2016   Procedure: Intravascular Pressure Wire/FFR Study;  Surgeon: Isaias Cowman, MD;  Location: [place] [facility];  Service: Cardiovascular;  Laterality: N/A;  . CARDIAC CATHETERIZATION N/A 07/12/2016   Procedure: Coronary  Stent Intervention;  Surgeon: Isaias Cowman, MD;  Location: [place] [facility];  Service: Cardiovascular;  Laterality: N/A;  . KNEE SURGERY Right 15 years ago    Social History Social History   Tobacco Use  . Smoking status: Never Smoker  . Smokeless tobacco: Former Systems developer    Types: Snuff  Substance Use Topics  . Alcohol use: No  . Drug use: Never    Family History  Problem Relation Age of Onset  . Breast cancer Sister 82     Allergies  Allergen Reactions  . Enalapril Swelling    Swelling face     REVIEW OF SYSTEMS (Negative unless checked)  Constitutional: [] Weight loss  [] Fever  [] Chills Cardiac: [] Chest pain   [] Chest pressure   [] Palpitations   [] Shortness of breath when laying flat   [] Shortness of breath at rest   [] Shortness of breath with exertion. Vascular:  [x] Pain in legs with walking   [] Pain in legs at rest   [] Pain in legs when laying flat   [] Claudication   [] Pain in feet when walking  [] Pain in feet at rest  [] Pain in feet when laying flat   [] History of DVT   [] Phlebitis   [x] Swelling in legs   [] Varicose veins   [] Non-healing ulcers Pulmonary:   [] Uses home oxygen   [] Productive cough   [] Hemoptysis   [] Wheeze  [] COPD   [] Asthma Neurologic:  [] Dizziness  [] Blackouts   [] Seizures   [] History of stroke   [] History of TIA  [] Aphasia   [] Temporary blindness   [] Dysphagia   [] Weakness or numbness in arms   [] Weakness or numbness in legs Musculoskeletal:  [x] Arthritis   [] Joint swelling   [] Joint pain   [] Low back pain Hematologic:  [] Easy bruising  [] Easy bleeding   [] Hypercoagulable state   [] Anemic   Gastrointestinal:  [] Blood in stool   [] Vomiting blood  [x] Gastroesophageal reflux/heartburn   [] Abdominal pain Genitourinary:  [] Chronic kidney disease   [] Difficult urination  [] Frequent urination  [] Burning with urination   [] Hematuria Skin:  [] Rashes   [] Ulcers   [] Wounds Psychological:  [] History of anxiety   []  History of major depression.  Physical  Examination  BP (!) 156/76 (BP Location: Right Arm)   Pulse 69   Resp 16   Wt 252 lb 6.4 oz (114.5 kg)   LMP 05/19/2015 (LMP Unknown)   BMI 40.74 kg/m  Gen:  WD/WN, NAD Head: [place]/AT, No temporalis wasting. Ear/Nose/Throat: Hearing grossly intact, nares w/o erythema or drainage Eyes: Conjunctiva clear. Sclera non-icteric Neck: Supple.  Trachea midline Pulmonary:  Good air movement, no use of accessory muscles.  Cardiac: RRR, no JVD Vascular:  Vessel Right Left  Radial Palpable Palpable                          PT Palpable Palpable  DP Palpable Palpable   Gastrointestinal: soft, non-tender/non-distended.  No guarding/reflex.  Musculoskeletal: M/S 5/5 throughout.  No deformity or atrophy. Trace BLE edema. Neurologic: Sensation grossly intact in extremities.  Symmetrical.  Speech is fluent.  Psychiatric: Judgment intact, Mood & affect appropriate for pt's clinical situation. Dermatologic: No rashes or ulcers noted.  No cellulitis or open wounds.       Labs Recent Results (from the past 2160 hour(s))  Urinalysis, Complete w Microscopic     Status: Abnormal   Collection Time: 06/25/19 12:10 PM  Result Value Ref Range   Color, Urine YELLOW (A) YELLOW   APPearance CLOUDY (A) CLEAR   Specific Gravity, Urine 1.027 1.005 - 1.030   pH 5.0 5.0 - 8.0   Glucose, UA NEGATIVE NEGATIVE mg/dL   Hgb urine dipstick NEGATIVE NEGATIVE   Bilirubin Urine NEGATIVE NEGATIVE   Ketones, ur 20 (A) NEGATIVE mg/dL   Protein, ur 30 (A) NEGATIVE mg/dL   Nitrite NEGATIVE NEGATIVE   Leukocytes,Ua MODERATE (A) NEGATIVE   RBC / HPF 0-5 0 - 5 RBC/hpf   WBC, UA 6-10 0 - 5 WBC/hpf   Bacteria, UA NONE SEEN NONE SEEN   Squamous Epithelial / LPF 6-10 0 - 5   Mucus PRESENT     Comment: Performed at [facility], [address] [place], [place] [address]  Lipase, blood     Status: None   Collection Time: 06/25/19 12:18 PM  Result Value Ref Range   Lipase 33 11 - 51 U/L    Comment:  Performed at [facility], [address] [place], [place] [address]  Comprehensive metabolic panel     Status: Abnormal   Collection Time: 06/25/19 12:18 PM  Result Value Ref Range   Sodium 141 135 - 145 mmol/L   Potassium 3.9 3.5 - 5.1 mmol/L   Chloride 103 98 - 111 mmol/L   CO2 23 22 - 32 mmol/L   Glucose, Bld 106 (H) 70 - 99 mg/dL   BUN 16 6 - 20 mg/dL   Creatinine, Ser 1.09 (H) 0.44 - 1.00 mg/dL   Calcium 9.1 8.9 - 10.3 mg/dL   Total Protein 8.3 (H) 6.5 - 8.1 g/dL   Albumin 4.0 3.5 - 5.0 g/dL   AST 21 15 - 41 U/L   ALT 12 0 - 44 U/L   Alkaline Phosphatase 85 38 - 126 U/L   Total Bilirubin 0.6 0.3 - 1.2 mg/dL   GFR calc non Af Amer 58 (L) >60 mL/min   GFR calc Af Amer >60 >60 mL/min   Anion gap 15 5 - 15    Comment: Performed at [facility], [address] [place], [place] [address]  CBC     Status: Abnormal   Collection Time: 06/25/19 12:18 PM  Result Value Ref Range   WBC 11.0 (H) 4.0 - 10.5 K/uL   RBC 5.58 (H) 3.87 - 5.11 MIL/uL   Hemoglobin 14.4 12.0 - 15.0 g/dL   HCT 46.4 (H) 36.0 - 46.0 %   MCV 83.2 80.0 - 100.0 fL   MCH 25.8 (L) 26.0 - 34.0 pg   MCHC 31.0 30.0 - 36.0 g/dL   RDW 16.4 (H) 11.5 - 15.5 %   Platelets 276 150 - 400 K/uL   nRBC 0.0 0.0 - 0.2 %    Comment: Performed at [facility], [address] [place], [place] [address]  POC SARS Coronavirus 2 Ag     Status: Abnormal   Collection Time: 06/25/19  1:26 PM  Result Value Ref Range  SARS Coronavirus 2 Ag POSITIVE (A) NEGATIVE    Comment: (NOTE) SARS-CoV-2 antigen PRESENT. Positive results indicate the presence of viral antigens, but clinical correlation with patient history and other diagnostic information is necessary to determine patient infection status.  Positive results do not rule out bacterial infection or co-infection  with other viruses. False positive results are rare but can occur, and confirmatory RT-PCR testing may be appropriate in some circumstances.  The expected result is Negative. Fact Sheet for Patients: PodPark.tn Fact Sheet for Providers: GiftContent.is  This test is not yet approved or cleared by the [place] FDA and  has been authorized for detection and/or diagnosis of SARS-CoV-2 by FDA under an Emergency Use Authorization (EUA).  This EUA will remain in effect (meaning this test can be used) for the duration of  the COVID-19 declaration under Section 564(b)(1) of the Act, 21 U.S.C. section 360bbb-3(b)(1), unless the a uthorization is terminated or revoked sooner.   CBC     Status: Abnormal   Collection Time: 06/29/19 10:22 PM  Result Value Ref Range   WBC 7.4 4.0 - 10.5 K/uL   RBC 5.37 (H) 3.87 - 5.11 MIL/uL   Hemoglobin 13.8 12.0 - 15.0 g/dL   HCT 42.5 36.0 - 46.0 %   MCV 79.1 (L) 80.0 - 100.0 fL   MCH 25.7 (L) 26.0 - 34.0 pg   MCHC 32.5 30.0 - 36.0 g/dL   RDW 16.1 (H) 11.5 - 15.5 %   Platelets 267 150 - 400 K/uL   nRBC 0.0 0.0 - 0.2 %    Comment: Performed at [facility], [address] [place], [place] [address]  Comprehensive metabolic panel     Status: Abnormal   Collection Time: 06/29/19 10:22 PM  Result Value Ref Range   Sodium 140 135 - 145 mmol/L   Potassium 3.6 3.5 - 5.1 mmol/L   Chloride 105 98 - 111 mmol/L   CO2 22 22 - 32 mmol/L   Glucose, Bld 108 (H) 70 - 99 mg/dL   BUN 18 6 - 20 mg/dL   Creatinine, Ser 1.20 (H) 0.44 - 1.00 mg/dL   Calcium 9.0 8.9 - 10.3 mg/dL   Total Protein 8.0 6.5 - 8.1 g/dL   Albumin 3.7 3.5 - 5.0 g/dL   AST 27 15 - 41 U/L   ALT 13 0 - 44 U/L   Alkaline Phosphatase 70 38 - 126 U/L   Total Bilirubin 0.9 0.3 - 1.2 mg/dL   GFR calc non Af Amer 51 (L) >60 mL/min   GFR calc Af Amer 59 (L) >60 mL/min   Anion gap 13 5 - 15    Comment: Performed at [facility], [address] [place], [place] [address]  Troponin I (High Sensitivity)     Status: None   Collection Time: 06/29/19 10:22 PM  Result  Value Ref Range   Troponin I (High Sensitivity) 9 <18 ng/L    Comment: (NOTE) Elevated high sensitivity troponin I (hsTnI) values and significant  changes across serial measurements may suggest ACS but many other  chronic and acute conditions are known to elevate hsTnI results.  Refer to the "Links" section for chest pain algorithms and additional  guidance. Performed at [facility], [address] [place],  [address]   Troponin I (High Sensitivity)     Status: None   Collection Time: 06/30/19 12:27 AM  Result Value Ref Range   Troponin I (High Sensitivity) 11 <18 ng/L  Comment: (NOTE) Elevated high sensitivity troponin I (hsTnI) values and significant  changes across serial measurements may suggest ACS but many other  chronic and acute conditions are known to elevate hsTnI results.  Refer to the "Links" section for chest pain algorithms and additional  guidance. Performed at [facility], [address] [place], [place] [address]   Lipase, blood     Status: None   Collection Time: 06/30/19 12:27 AM  Result Value Ref Range   Lipase 45 11 - 51 U/L    Comment: Performed at [facility], [address] [place], [place] [address]  Urinalysis, Complete w Microscopic     Status: Abnormal   Collection Time: 06/30/19  5:23 AM  Result Value Ref Range   Color, Urine AMBER (A) YELLOW    Comment: BIOCHEMICALS MAY BE AFFECTED BY COLOR   APPearance CLOUDY (A) CLEAR   Specific Gravity, Urine 1.021 1.005 - 1.030   pH 5.0 5.0 - 8.0   Glucose, UA NEGATIVE NEGATIVE mg/dL   Hgb urine dipstick NEGATIVE NEGATIVE   Bilirubin Urine NEGATIVE NEGATIVE   Ketones, ur 20 (A) NEGATIVE mg/dL   Protein, ur 100 (A) NEGATIVE mg/dL   Nitrite NEGATIVE NEGATIVE   Leukocytes,Ua LARGE (A) NEGATIVE   RBC / HPF 11-20 0 - 5 RBC/hpf   WBC, UA 11-20 0 - 5 WBC/hpf   Bacteria, UA RARE (A) NONE SEEN   Squamous Epithelial / LPF 21-50 0 - 5   Mucus PRESENT    Hyaline Casts,  UA PRESENT     Comment: Performed at [facility], [address] [place], [place] [address]    Radiology No results found.  Assessment/Plan  Essential hypertension blood pressure control important in reducing the progression of atherosclerotic disease. On appropriate oral medications.   Pain in limb Noninvasive studies today show normal ABIs of 1.07 on the right and 1.02 on the left with triphasic waveforms.  Symptoms are better.  She can wear compression stockings for swelling if it worsens.  Return to clinic as needed    Leotis Pain, MD  08/08/2019 12:32 PM    This note was created with Dragon medical transcription system.  Any errors from dictation are purely unintentional

## 2019-08-08 NOTE — Assessment & Plan Note (Signed)
Noninvasive studies today show normal ABIs of 1.07 on the right and 1.02 on the left with triphasic waveforms.  Symptoms are better.  She can wear compression stockings for swelling if it worsens.  Return to clinic as needed

## 2019-08-08 NOTE — Assessment & Plan Note (Signed)
blood pressure control important in reducing the progression of atherosclerotic disease. On appropriate oral medications.  

## 2019-12-19 ENCOUNTER — Other Ambulatory Visit: Payer: Self-pay | Admitting: Internal Medicine

## 2019-12-19 DIAGNOSIS — Z1231 Encounter for screening mammogram for malignant neoplasm of breast: Secondary | ICD-10-CM

## 2019-12-25 LAB — COLOGUARD: COLOGUARD: NEGATIVE

## 2020-01-29 ENCOUNTER — Ambulatory Visit
Admission: RE | Admit: 2020-01-29 | Discharge: 2020-01-29 | Disposition: A | Discharge status: Home / Self Care | Payer: Medicare HMO | Source: Ambulatory Visit | Attending: Internal Medicine | Admitting: Internal Medicine

## 2020-01-29 ENCOUNTER — Other Ambulatory Visit: Payer: Self-pay

## 2020-01-29 DIAGNOSIS — Z1231 Encounter for screening mammogram for malignant neoplasm of breast: Secondary | ICD-10-CM | POA: Insufficient documentation

## 2020-09-09 ENCOUNTER — Other Ambulatory Visit
Admission: RE | Admit: 2020-09-09 | Discharge: 2020-09-09 | Disposition: A | Discharge status: Home / Self Care | Payer: Medicare HMO | Attending: Family | Admitting: Family

## 2020-09-09 DIAGNOSIS — I10 Essential (primary) hypertension: Secondary | ICD-10-CM | POA: Diagnosis not present

## 2020-09-09 DIAGNOSIS — E119 Type 2 diabetes mellitus without complications: Secondary | ICD-10-CM | POA: Diagnosis present

## 2020-09-09 DIAGNOSIS — E039 Hypothyroidism, unspecified: Secondary | ICD-10-CM | POA: Insufficient documentation

## 2020-09-09 DIAGNOSIS — E559 Vitamin D deficiency, unspecified: Secondary | ICD-10-CM | POA: Insufficient documentation

## 2020-09-09 DIAGNOSIS — E782 Mixed hyperlipidemia: Secondary | ICD-10-CM | POA: Diagnosis not present

## 2020-09-09 LAB — COMPREHENSIVE METABOLIC PANEL
ALT: 14 U/L (ref 0–44)
AST: 20 U/L (ref 15–41)
Albumin: 3.7 g/dL (ref 3.5–5.0)
Alkaline Phosphatase: 81 U/L (ref 38–126)
Anion gap: 8 (ref 5–15)
BUN: 14 mg/dL (ref 6–20)
CO2: 28 mmol/L (ref 22–32)
Calcium: 9.2 mg/dL (ref 8.9–10.3)
Chloride: 102 mmol/L (ref 98–111)
Creatinine, Ser: 0.89 mg/dL (ref 0.44–1.00)
GFR, Estimated: >60 mL/min (ref >60)
Glucose, Bld: 84 mg/dL (ref 70–99)
Potassium: 4.3 mmol/L (ref 3.5–5.1)
Sodium: 138 mmol/L (ref 135–145)
Total Bilirubin: 1 mg/dL (ref 0.3–1.2)
Total Protein: 7.5 g/dL (ref 6.5–8.1)

## 2020-09-09 LAB — CBC WITH DIFFERENTIAL/PLATELET
Abs Immature Granulocytes: 0.03 10*3/uL (ref 0.00–0.07)
Basophils Absolute: 0 10*3/uL (ref 0.0–0.1)
Basophils Relative: 0 %
Eosinophils Absolute: 0.1 10*3/uL (ref 0.0–0.5)
Eosinophils Relative: 1 %
HCT: 41.4 % (ref 36.0–46.0)
Hemoglobin: 13.4 g/dL (ref 12.0–15.0)
Immature Granulocytes: 0 %
Lymphocytes Relative: 21 %
Lymphs Abs: 1.7 10*3/uL (ref 0.7–4.0)
MCH: 28.5 pg (ref 26.0–34.0)
MCHC: 32.4 g/dL (ref 30.0–36.0)
MCV: 87.9 fL (ref 80.0–100.0)
Monocytes Absolute: 0.6 10*3/uL (ref 0.1–1.0)
Monocytes Relative: 7 %
Neutro Abs: 5.7 10*3/uL (ref 1.7–7.7)
Neutrophils Relative %: 71 %
Platelets: 250 10*3/uL (ref 150–400)
RBC: 4.71 MIL/uL (ref 3.87–5.11)
RDW: 14.5 % (ref 11.5–15.5)
WBC: 8.1 10*3/uL (ref 4.0–10.5)
nRBC: 0 % (ref 0.0–0.2)

## 2020-09-09 LAB — HEMOGLOBIN A1C
Hgb A1c MFr Bld: 6 % — ABNORMAL HIGH (ref 4.8–5.6)
Mean Plasma Glucose: 125.5 mg/dL

## 2020-09-09 LAB — TSH: TSH: 1.378 u[IU]/mL (ref 0.350–4.500)

## 2020-09-09 LAB — LIPID PANEL
Cholesterol: 161 mg/dL (ref 0–200)
HDL: 55 mg/dL (ref >40)
LDL Cholesterol: 90 mg/dL (ref 0–99)
Total CHOL/HDL Ratio: 2.9 RATIO
Triglycerides: 79 mg/dL (ref <150)
VLDL: 16 mg/dL (ref 0–40)

## 2020-09-09 LAB — VITAMIN D 25 HYDROXY (VIT D DEFICIENCY, FRACTURES): Vit D, 25-Hydroxy: 33.22 ng/mL (ref 30–100)

## 2020-09-29 ENCOUNTER — Other Ambulatory Visit
Admission: RE | Admit: 2020-09-29 | Discharge: 2020-09-29 | Disposition: A | Discharge status: Home / Self Care | Payer: Medicare HMO | Attending: Family | Admitting: Family

## 2020-09-29 DIAGNOSIS — M109 Gout, unspecified: Secondary | ICD-10-CM | POA: Diagnosis present

## 2020-09-29 LAB — URIC ACID: Uric Acid, Serum: 5.4 mg/dL (ref 2.5–7.1)

## 2020-12-16 DIAGNOSIS — I2119 ST elevation (STEMI) myocardial infarction involving other coronary artery of inferior wall: Secondary | ICD-10-CM | POA: Diagnosis not present

## 2020-12-16 DIAGNOSIS — I4901 Ventricular fibrillation: Secondary | ICD-10-CM | POA: Diagnosis not present

## 2020-12-16 DIAGNOSIS — I10 Essential (primary) hypertension: Secondary | ICD-10-CM | POA: Diagnosis not present

## 2020-12-16 DIAGNOSIS — R609 Edema, unspecified: Secondary | ICD-10-CM | POA: Diagnosis not present

## 2020-12-16 DIAGNOSIS — I2510 Atherosclerotic heart disease of native coronary artery without angina pectoris: Secondary | ICD-10-CM | POA: Diagnosis not present

## 2020-12-16 DIAGNOSIS — I259 Chronic ischemic heart disease, unspecified: Secondary | ICD-10-CM | POA: Diagnosis not present

## 2020-12-16 DIAGNOSIS — Z955 Presence of coronary angioplasty implant and graft: Secondary | ICD-10-CM | POA: Diagnosis not present

## 2020-12-16 DIAGNOSIS — R079 Chest pain, unspecified: Secondary | ICD-10-CM | POA: Diagnosis not present

## 2021-01-04 DIAGNOSIS — I10 Essential (primary) hypertension: Secondary | ICD-10-CM | POA: Diagnosis not present

## 2021-01-04 DIAGNOSIS — E782 Mixed hyperlipidemia: Secondary | ICD-10-CM | POA: Diagnosis not present

## 2021-01-04 DIAGNOSIS — E119 Type 2 diabetes mellitus without complications: Secondary | ICD-10-CM | POA: Diagnosis not present

## 2021-01-04 DIAGNOSIS — E559 Vitamin D deficiency, unspecified: Secondary | ICD-10-CM | POA: Diagnosis not present

## 2021-01-04 DIAGNOSIS — R7303 Prediabetes: Secondary | ICD-10-CM | POA: Diagnosis not present

## 2021-01-04 DIAGNOSIS — R5383 Other fatigue: Secondary | ICD-10-CM | POA: Diagnosis not present

## 2021-01-04 DIAGNOSIS — M940 Chondrocostal junction syndrome [Tietze]: Secondary | ICD-10-CM | POA: Diagnosis not present

## 2021-02-02 ENCOUNTER — Other Ambulatory Visit: Payer: Self-pay | Admitting: Cardiology

## 2021-02-02 DIAGNOSIS — I2510 Atherosclerotic heart disease of native coronary artery without angina pectoris: Secondary | ICD-10-CM

## 2021-02-08 ENCOUNTER — Ambulatory Visit
Admission: RE | Admit: 2021-02-08 | Discharge: 2021-02-08 | Disposition: A | Discharge status: Home / Self Care | Payer: Medicare HMO | Source: Ambulatory Visit | Attending: Cardiology | Admitting: Cardiology

## 2021-02-08 ENCOUNTER — Other Ambulatory Visit: Payer: Self-pay

## 2021-02-08 DIAGNOSIS — I2510 Atherosclerotic heart disease of native coronary artery without angina pectoris: Secondary | ICD-10-CM | POA: Insufficient documentation

## 2021-02-08 LAB — NM MYOCAR MULTI W/SPECT W/WALL MOTION / EF
Estimated workload: 1 METS
Exercise duration (min): 1 min
Exercise duration (sec): 2 s
LV dias vol: 79 mL (ref 46–106)
LV sys vol: 31 mL
MPHR: 164 {beats}/min
Peak HR: 164 {beats}/min
Percent HR: 58 %
Rest HR: 55 {beats}/min
SDS: 1
SRS: 10
SSS: 11
TID: 0.93

## 2021-02-08 MED ORDER — REGADENOSON 0.4 MG/5ML IV SOLN
0.4000 mg | Freq: Once | INTRAVENOUS | Status: AC
  Administered 2021-02-08: 0.4 mg via INTRAVENOUS
  Filled 2021-02-08: qty 5

## 2021-02-08 MED ORDER — TECHNETIUM TC 99M TETROFOSMIN IV KIT
29.9300 | PACK | Freq: Once | INTRAVENOUS | Status: AC | PRN
  Administered 2021-02-08: 29.93 via INTRAVENOUS

## 2021-02-08 MED ORDER — TECHNETIUM TC 99M TETROFOSMIN IV KIT
10.0000 | PACK | Freq: Once | INTRAVENOUS | Status: AC | PRN
  Administered 2021-02-08: 10.9 via INTRAVENOUS

## 2021-02-21 IMAGING — CT CT OF THE LEFT TIBIA AND FIBULA WITH CONTRAST
2 of 3 series · 12 of 33 positions shown, 15 images · IV contrast (APPLIED)
Comparison: None.

CONTRAST:  150mL OMNIPAQUE IOHEXOL 350 MG/ML SOLN
COMPARISON: None.

CONTRAST:  150mL OMNIPAQUE IOHEXOL 350 MG/ML SOLN

Addendum:
CLINICAL DATA: Left lower extremity pain and swelling.

EXAM:
CT OF THE LOWER RIGHT EXTREMITY WITH CONTRAST
TECHNIQUE: Multidetector CT imaging of the lower right extremity was performed
according to the standard protocol following intravenous contrast
administration.

[Series 4: axial st · axial · 0.52mm/px · z∈[-1225,-831]mm · 9 of 233 slices shown, 12 images]
[im 18/233  soft-tissue]
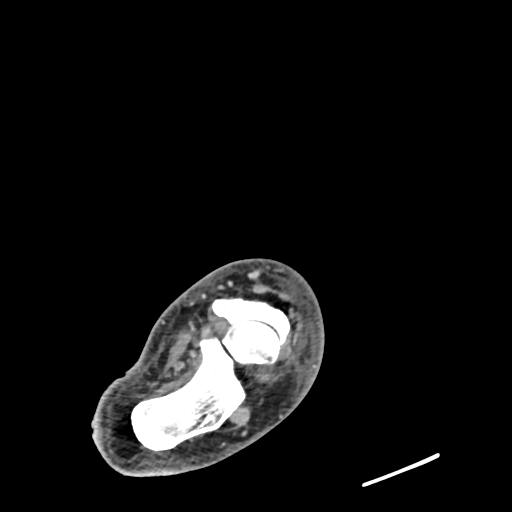
[im 18/233  bone]
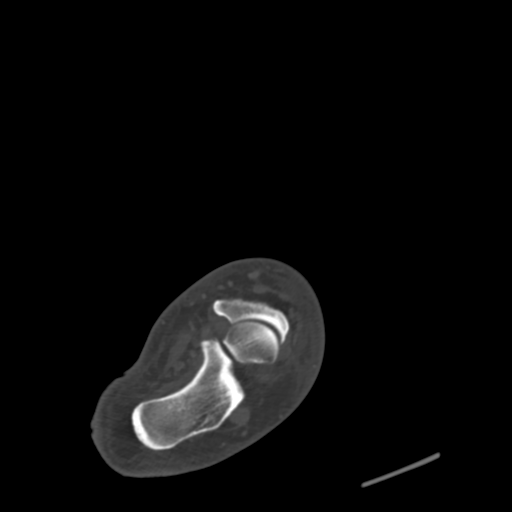
[im 54/233  bone]
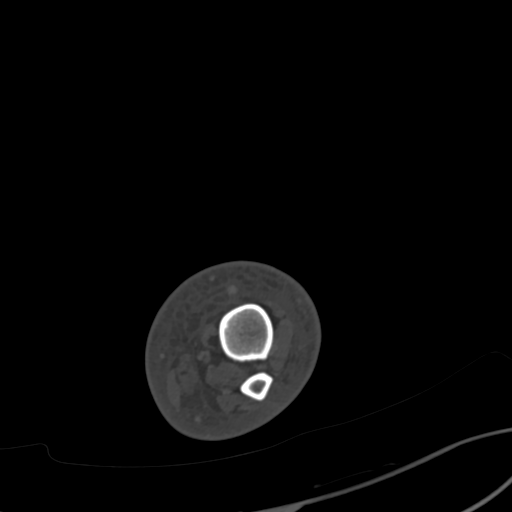
[im 72/233  bone]
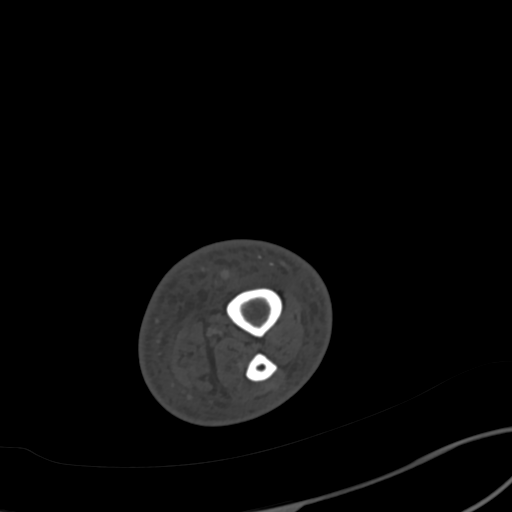
[im 90/233  bone]
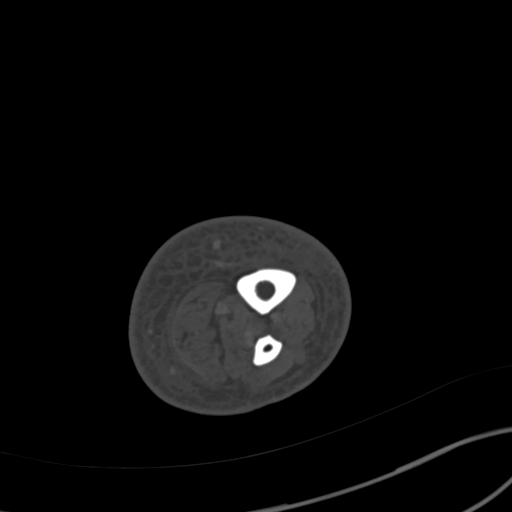
[im 125/233  soft-tissue]
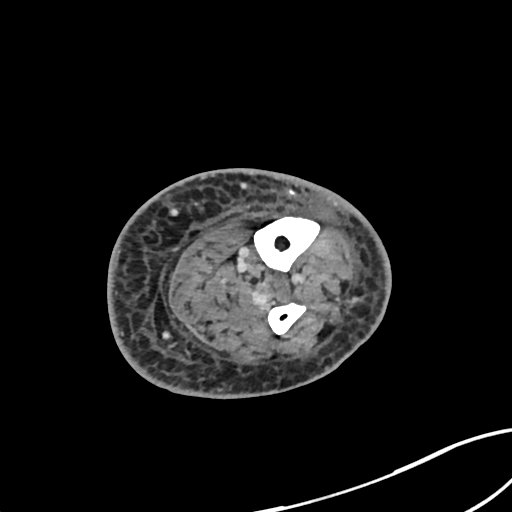
[im 125/233  bone]
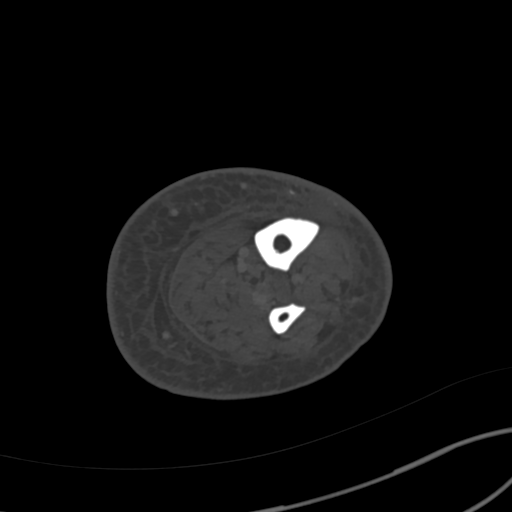
[im 143/233  bone]
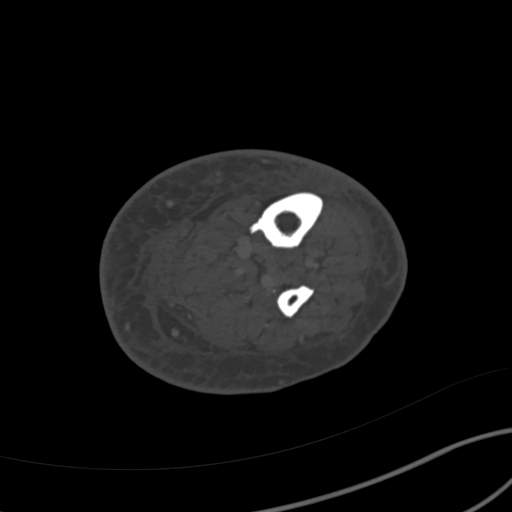
[im 161/233  bone]
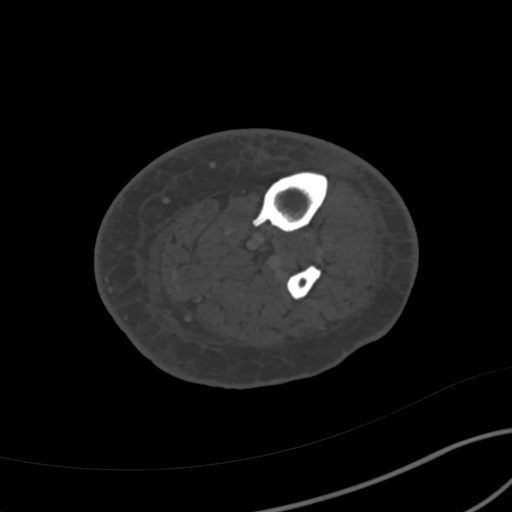
[im 197/233  bone]
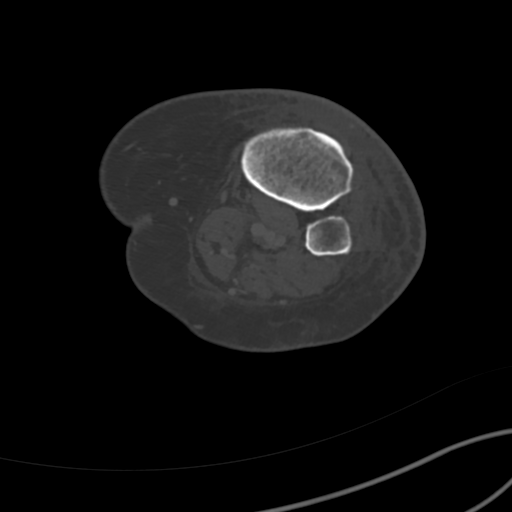
[im 215/233  soft-tissue]
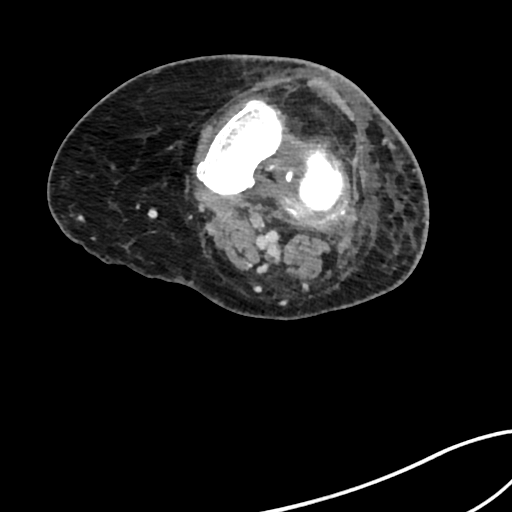
[im 215/233  bone]
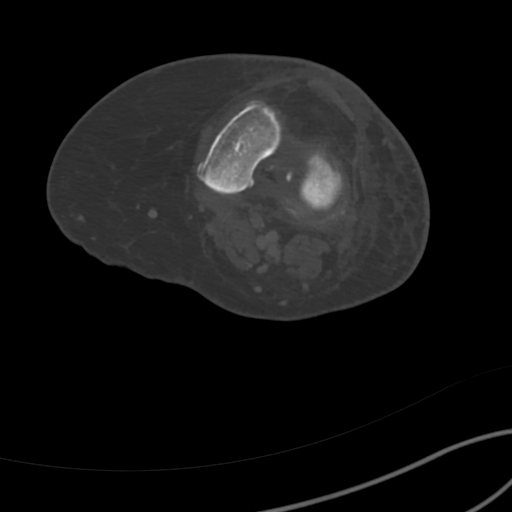

[Series 9: coronal st · coronal · 0.49mm/px · 3 of 92 slices shown]
[im 20/92  bone]
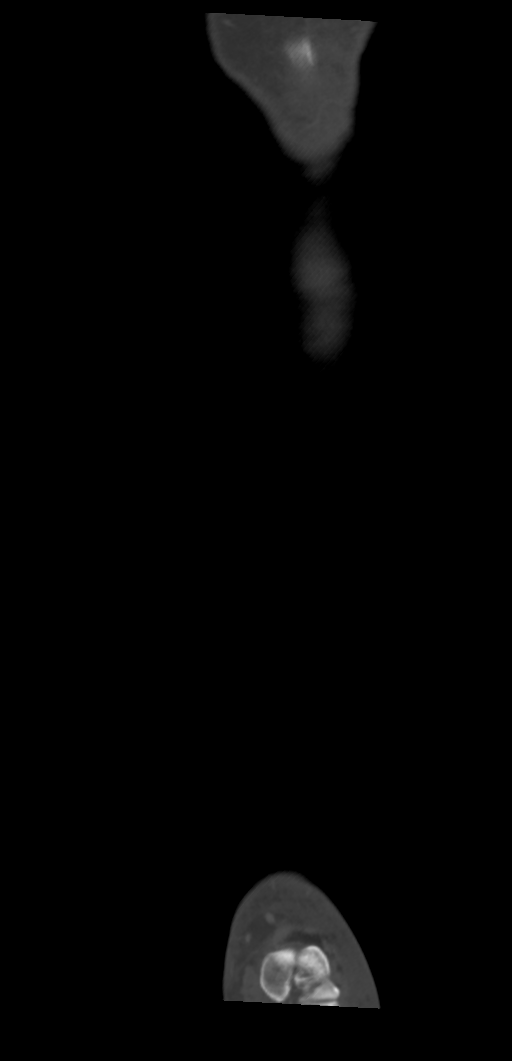
[im 37/92  bone]
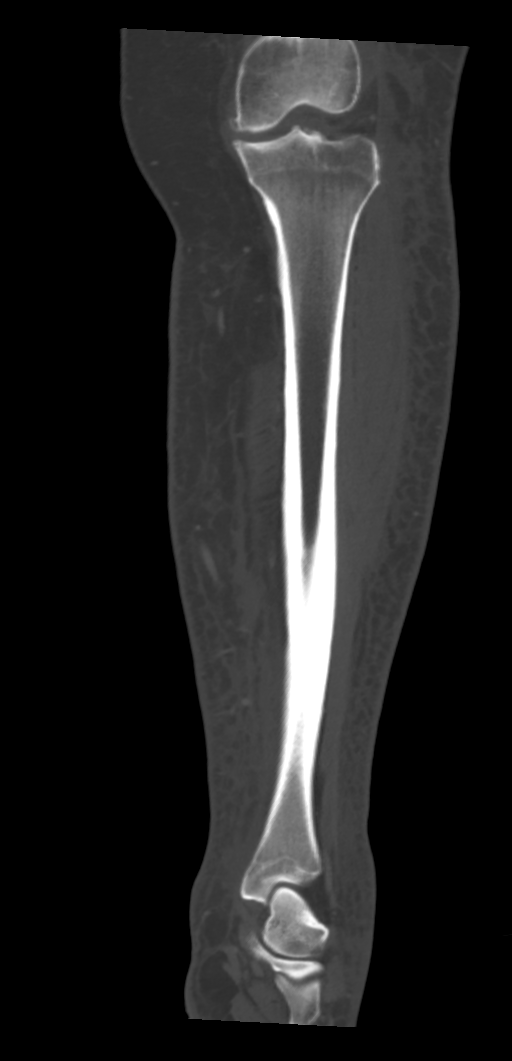
[im 55/92  bone]
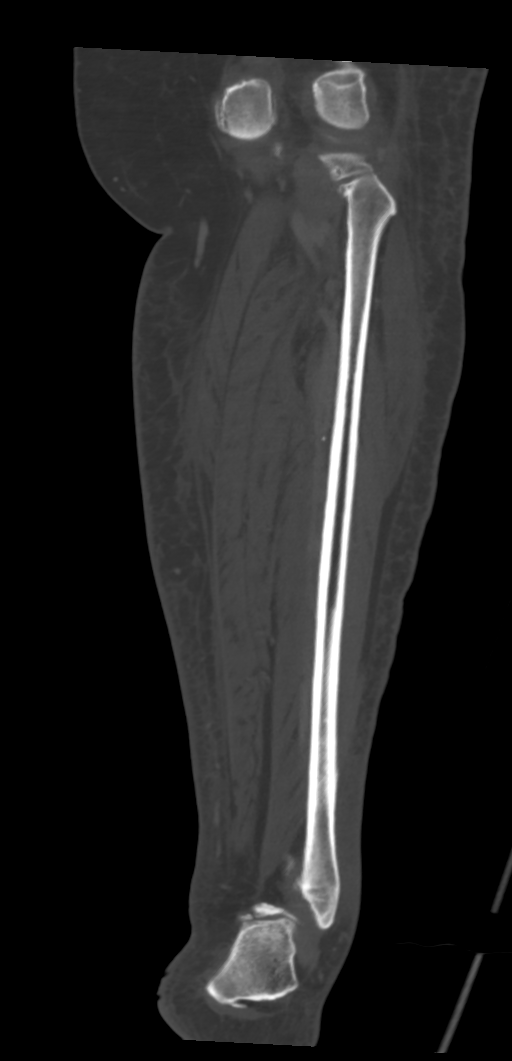

[12 of 33 positions shown; findings below may reference images not displayed]

FINDINGS: Subcutaneous soft tissue swelling/edema/fluid mainly below the knee
suggesting cellulitis. No discrete drainable rim enhancing soft
tissue abscess is identified.

The hip joint is maintained. Moderate degenerative changes are noted
at the knee, mainly involving the medial compartment with joint
space narrowing, osteophytic spurring and mild chondrocalcinosis.
Small knee joint effusion. The ankle joint is maintained. No
findings suspicious for septic arthritis. No findings for
osteomyelitis involving the femur, tibia or fibula.

No CT findings suspicious for myofasciitis or pyomyositis.

The major venous structures are patent. No findings for deep venous
thrombosis. The major arterial structures are also normal. No
significant atherosclerotic disease.
IMPRESSION: 1. CT findings suggest cellulitis involving the left lower extremity
mainly below the knee. No discrete soft tissue abscess.
2. No findings for myofasciitis or pyomyositis.
3. No CT findings to suggest septic arthritis or osteomyelitis.
4. Unremarkable appearance of the major arterial vascular structures
in the left lower extremity. No findings for deep venous thrombosis.

ADDENDUM:
Examination should state: CT of the left lower extremity with
contrast.

CT venogram was part of this examination and as dictated in the
report the major venous structures are patent. No findings for deep
venous thrombosis.

*** End of Addendum ***
FINDINGS: Subcutaneous soft tissue swelling/edema/fluid mainly below the knee
suggesting cellulitis. No discrete drainable rim enhancing soft
tissue abscess is identified.

The hip joint is maintained. Moderate degenerative changes are noted
at the knee, mainly involving the medial compartment with joint
space narrowing, osteophytic spurring and mild chondrocalcinosis.
Small knee joint effusion. The ankle joint is maintained. No
findings suspicious for septic arthritis. No findings for
osteomyelitis involving the femur, tibia or fibula.

No CT findings suspicious for myofasciitis or pyomyositis.

The major venous structures are patent. No findings for deep venous
thrombosis. The major arterial structures are also normal. No
significant atherosclerotic disease.
IMPRESSION: 1. CT findings suggest cellulitis involving the left lower extremity
mainly below the knee. No discrete soft tissue abscess.
2. No findings for myofasciitis or pyomyositis.
3. No CT findings to suggest septic arthritis or osteomyelitis.
4. Unremarkable appearance of the major arterial vascular structures
in the left lower extremity. No findings for deep venous thrombosis.

## 2021-02-23 DIAGNOSIS — Z955 Presence of coronary angioplasty implant and graft: Secondary | ICD-10-CM | POA: Diagnosis not present

## 2021-02-23 DIAGNOSIS — R051 Acute cough: Secondary | ICD-10-CM | POA: Diagnosis not present

## 2021-02-23 DIAGNOSIS — Z03818 Encounter for observation for suspected exposure to other biological agents ruled out: Secondary | ICD-10-CM | POA: Diagnosis not present

## 2021-02-23 DIAGNOSIS — I2119 ST elevation (STEMI) myocardial infarction involving other coronary artery of inferior wall: Secondary | ICD-10-CM | POA: Diagnosis not present

## 2021-02-23 DIAGNOSIS — J019 Acute sinusitis, unspecified: Secondary | ICD-10-CM | POA: Diagnosis not present

## 2021-02-23 DIAGNOSIS — E785 Hyperlipidemia, unspecified: Secondary | ICD-10-CM | POA: Diagnosis not present

## 2021-02-23 DIAGNOSIS — I10 Essential (primary) hypertension: Secondary | ICD-10-CM | POA: Diagnosis not present

## 2021-02-23 DIAGNOSIS — Z6841 Body Mass Index (BMI) 40.0 and over, adult: Secondary | ICD-10-CM | POA: Diagnosis not present

## 2021-05-06 DIAGNOSIS — R7303 Prediabetes: Secondary | ICD-10-CM | POA: Diagnosis not present

## 2021-05-06 DIAGNOSIS — E559 Vitamin D deficiency, unspecified: Secondary | ICD-10-CM | POA: Diagnosis not present

## 2021-05-06 DIAGNOSIS — I10 Essential (primary) hypertension: Secondary | ICD-10-CM | POA: Diagnosis not present

## 2021-05-06 DIAGNOSIS — E782 Mixed hyperlipidemia: Secondary | ICD-10-CM | POA: Diagnosis not present

## 2021-05-18 DIAGNOSIS — H5213 Myopia, bilateral: Secondary | ICD-10-CM | POA: Diagnosis not present

## 2021-08-24 DIAGNOSIS — I10 Essential (primary) hypertension: Secondary | ICD-10-CM | POA: Diagnosis not present

## 2021-08-24 DIAGNOSIS — I2510 Atherosclerotic heart disease of native coronary artery without angina pectoris: Secondary | ICD-10-CM | POA: Diagnosis not present

## 2021-08-24 DIAGNOSIS — Z955 Presence of coronary angioplasty implant and graft: Secondary | ICD-10-CM | POA: Diagnosis not present

## 2021-08-24 DIAGNOSIS — Z23 Encounter for immunization: Secondary | ICD-10-CM | POA: Diagnosis not present

## 2021-08-24 DIAGNOSIS — I2119 ST elevation (STEMI) myocardial infarction involving other coronary artery of inferior wall: Secondary | ICD-10-CM | POA: Diagnosis not present

## 2021-08-24 DIAGNOSIS — I4901 Ventricular fibrillation: Secondary | ICD-10-CM | POA: Diagnosis not present

## 2021-09-02 DIAGNOSIS — E782 Mixed hyperlipidemia: Secondary | ICD-10-CM | POA: Diagnosis not present

## 2021-09-02 DIAGNOSIS — E559 Vitamin D deficiency, unspecified: Secondary | ICD-10-CM | POA: Diagnosis not present

## 2021-09-02 DIAGNOSIS — M79605 Pain in left leg: Secondary | ICD-10-CM | POA: Diagnosis not present

## 2021-09-02 DIAGNOSIS — R7303 Prediabetes: Secondary | ICD-10-CM | POA: Diagnosis not present

## 2021-09-02 DIAGNOSIS — I10 Essential (primary) hypertension: Secondary | ICD-10-CM | POA: Diagnosis not present

## 2021-09-05 ENCOUNTER — Other Ambulatory Visit: Payer: Self-pay | Admitting: Family

## 2021-09-05 DIAGNOSIS — Z1231 Encounter for screening mammogram for malignant neoplasm of breast: Secondary | ICD-10-CM

## 2021-09-09 DIAGNOSIS — S86919A Strain of unspecified muscle(s) and tendon(s) at lower leg level, unspecified leg, initial encounter: Secondary | ICD-10-CM | POA: Insufficient documentation

## 2021-09-09 DIAGNOSIS — M1711 Unilateral primary osteoarthritis, right knee: Secondary | ICD-10-CM | POA: Diagnosis not present

## 2021-09-09 DIAGNOSIS — S86911A Strain of unspecified muscle(s) and tendon(s) at lower leg level, right leg, initial encounter: Secondary | ICD-10-CM | POA: Diagnosis not present

## 2021-09-29 ENCOUNTER — Ambulatory Visit
Admission: RE | Admit: 2021-09-29 | Discharge: 2021-09-29 | Disposition: A | Discharge status: Home / Self Care | Payer: Medicare HMO | Source: Ambulatory Visit | Attending: Family | Admitting: Family

## 2021-09-29 DIAGNOSIS — Z1231 Encounter for screening mammogram for malignant neoplasm of breast: Secondary | ICD-10-CM | POA: Insufficient documentation

## 2021-10-05 DIAGNOSIS — R300 Dysuria: Secondary | ICD-10-CM | POA: Diagnosis not present

## 2021-10-05 DIAGNOSIS — J019 Acute sinusitis, unspecified: Secondary | ICD-10-CM | POA: Diagnosis not present

## 2021-10-05 DIAGNOSIS — J309 Allergic rhinitis, unspecified: Secondary | ICD-10-CM | POA: Diagnosis not present

## 2021-10-05 DIAGNOSIS — R7303 Prediabetes: Secondary | ICD-10-CM | POA: Diagnosis not present

## 2021-10-05 DIAGNOSIS — N390 Urinary tract infection, site not specified: Secondary | ICD-10-CM | POA: Diagnosis not present

## 2021-11-04 DIAGNOSIS — R6889 Other general symptoms and signs: Secondary | ICD-10-CM | POA: Diagnosis not present

## 2021-11-30 DIAGNOSIS — I10 Essential (primary) hypertension: Secondary | ICD-10-CM | POA: Diagnosis not present

## 2021-11-30 DIAGNOSIS — M1711 Unilateral primary osteoarthritis, right knee: Secondary | ICD-10-CM | POA: Diagnosis not present

## 2021-11-30 DIAGNOSIS — E782 Mixed hyperlipidemia: Secondary | ICD-10-CM | POA: Diagnosis not present

## 2021-12-14 DIAGNOSIS — E039 Hypothyroidism, unspecified: Secondary | ICD-10-CM | POA: Diagnosis not present

## 2021-12-14 DIAGNOSIS — E119 Type 2 diabetes mellitus without complications: Secondary | ICD-10-CM | POA: Diagnosis not present

## 2021-12-14 DIAGNOSIS — I252 Old myocardial infarction: Secondary | ICD-10-CM | POA: Diagnosis not present

## 2021-12-14 DIAGNOSIS — I429 Cardiomyopathy, unspecified: Secondary | ICD-10-CM | POA: Diagnosis not present

## 2021-12-14 DIAGNOSIS — I10 Essential (primary) hypertension: Secondary | ICD-10-CM | POA: Diagnosis not present

## 2021-12-14 DIAGNOSIS — M1990 Unspecified osteoarthritis, unspecified site: Secondary | ICD-10-CM | POA: Diagnosis not present

## 2021-12-14 DIAGNOSIS — K219 Gastro-esophageal reflux disease without esophagitis: Secondary | ICD-10-CM | POA: Diagnosis not present

## 2021-12-14 DIAGNOSIS — I25119 Atherosclerotic heart disease of native coronary artery with unspecified angina pectoris: Secondary | ICD-10-CM | POA: Diagnosis not present

## 2021-12-21 DIAGNOSIS — M79604 Pain in right leg: Secondary | ICD-10-CM | POA: Diagnosis not present

## 2021-12-21 DIAGNOSIS — R059 Cough, unspecified: Secondary | ICD-10-CM | POA: Diagnosis not present

## 2021-12-21 DIAGNOSIS — R0982 Postnasal drip: Secondary | ICD-10-CM | POA: Diagnosis not present

## 2022-01-05 DIAGNOSIS — R7303 Prediabetes: Secondary | ICD-10-CM | POA: Diagnosis not present

## 2022-01-05 DIAGNOSIS — D519 Vitamin B12 deficiency anemia, unspecified: Secondary | ICD-10-CM | POA: Diagnosis not present

## 2022-01-05 DIAGNOSIS — E782 Mixed hyperlipidemia: Secondary | ICD-10-CM | POA: Diagnosis not present

## 2022-01-05 DIAGNOSIS — E559 Vitamin D deficiency, unspecified: Secondary | ICD-10-CM | POA: Diagnosis not present

## 2022-01-05 DIAGNOSIS — E1165 Type 2 diabetes mellitus with hyperglycemia: Secondary | ICD-10-CM | POA: Diagnosis not present

## 2022-01-05 DIAGNOSIS — I10 Essential (primary) hypertension: Secondary | ICD-10-CM | POA: Diagnosis not present

## 2022-01-25 ENCOUNTER — Ambulatory Visit (INDEPENDENT_AMBULATORY_CARE_PROVIDER_SITE_OTHER): Payer: Medicare HMO

## 2022-01-25 ENCOUNTER — Ambulatory Visit (INDEPENDENT_AMBULATORY_CARE_PROVIDER_SITE_OTHER): Payer: Medicare HMO | Admitting: Podiatry

## 2022-01-25 DIAGNOSIS — M76821 Posterior tibial tendinitis, right leg: Secondary | ICD-10-CM

## 2022-01-25 DIAGNOSIS — M2141 Flat foot [pes planus] (acquired), right foot: Secondary | ICD-10-CM | POA: Diagnosis not present

## 2022-01-25 DIAGNOSIS — M7751 Other enthesopathy of right foot: Secondary | ICD-10-CM

## 2022-01-25 DIAGNOSIS — M7750 Other enthesopathy of unspecified foot: Secondary | ICD-10-CM

## 2022-01-25 NOTE — Patient Instructions (Signed)

## 2022-01-29 NOTE — Progress Notes (Signed)
  Subjective:  Patient ID: Sarah Medina, female    DOB: 05-22-1966,  MRN: [id number]  Chief Complaint  Patient presents with   Foot Pain    NP - generalized right foot pain. Patient states that it starts in her knee and radiates down into her foot. She could not point to any specific area of pain. She has had many injections over time in that foot, but nothing seems to help. Her previous doctor told her she needed to walk more, but patient could not tell me that doctor's name    56 y.o. female presents with the above complaint. History confirmed with patient.  Most the pain is on the inside of the arch  Objective:  Physical Exam: warm, good capillary refill, no trophic changes or ulcerative lesions, normal DP and PT pulses, normal sensory exam, and she has pain along the PT tendon in the retromalleolar groove and distal to the insertion of the navicular which has a prominent tuberosity.   Radiographs: Multiple views x-ray of the right foot: Mild to moderate degenerative changes in the midfoot, she does have a prominent navicular tuberosity Assessment:   1. Posterior tibial tendon dysfunction (PTTD) of right lower extremity   2. Tendonitis of ankle or foot      Plan:  Patient was evaluated and treated and all questions answered.  Discussed the etiology and treatment options for posterior tibial tendinitis including stretching, formal physical therapy with an eccentric exercises therapy plan, supportive shoegears such as a running shoe or sneaker, bracing, topical and oral medications.  We also discussed that I do not routinely perform injections in this area because of the risk of an increased damage or rupture of the tendon.  We also discussed the role of surgical treatment of this for patients who do not improve after exhausting non-surgical treatment options.  -XR reviewed with patient -Educated on stretching and icing of the affected limb. -Referral placed to physical therapy.   Referral was sent to [facility] PT -Tri-Lock ankle brace was dispensed to support the PT tendon and offload the medial ankle and arch  Return in about 8 weeks (around 03/22/2022) for re-check PT tendonitis.

## 2022-01-30 DIAGNOSIS — M1711 Unilateral primary osteoarthritis, right knee: Secondary | ICD-10-CM | POA: Diagnosis not present

## 2022-01-30 DIAGNOSIS — I10 Essential (primary) hypertension: Secondary | ICD-10-CM | POA: Diagnosis not present

## 2022-01-30 DIAGNOSIS — E782 Mixed hyperlipidemia: Secondary | ICD-10-CM | POA: Diagnosis not present

## 2022-02-21 DIAGNOSIS — I10 Essential (primary) hypertension: Secondary | ICD-10-CM | POA: Diagnosis not present

## 2022-02-21 DIAGNOSIS — Z955 Presence of coronary angioplasty implant and graft: Secondary | ICD-10-CM | POA: Diagnosis not present

## 2022-02-21 DIAGNOSIS — I4901 Ventricular fibrillation: Secondary | ICD-10-CM | POA: Diagnosis not present

## 2022-02-21 DIAGNOSIS — E785 Hyperlipidemia, unspecified: Secondary | ICD-10-CM | POA: Diagnosis not present

## 2022-02-21 DIAGNOSIS — I2119 ST elevation (STEMI) myocardial infarction involving other coronary artery of inferior wall: Secondary | ICD-10-CM | POA: Diagnosis not present

## 2022-02-27 DIAGNOSIS — M25571 Pain in right ankle and joints of right foot: Secondary | ICD-10-CM | POA: Diagnosis not present

## 2022-02-27 DIAGNOSIS — R262 Difficulty in walking, not elsewhere classified: Secondary | ICD-10-CM | POA: Diagnosis not present

## 2022-02-27 DIAGNOSIS — M76821 Posterior tibial tendinitis, right leg: Secondary | ICD-10-CM | POA: Diagnosis not present

## 2022-03-01 DIAGNOSIS — R6889 Other general symptoms and signs: Secondary | ICD-10-CM | POA: Diagnosis not present

## 2022-03-01 DIAGNOSIS — R69 Illness, unspecified: Secondary | ICD-10-CM | POA: Diagnosis not present

## 2022-03-08 DIAGNOSIS — M76821 Posterior tibial tendinitis, right leg: Secondary | ICD-10-CM | POA: Diagnosis not present

## 2022-03-08 DIAGNOSIS — R262 Difficulty in walking, not elsewhere classified: Secondary | ICD-10-CM | POA: Diagnosis not present

## 2022-03-08 DIAGNOSIS — M25571 Pain in right ankle and joints of right foot: Secondary | ICD-10-CM | POA: Diagnosis not present

## 2022-03-10 DIAGNOSIS — M25571 Pain in right ankle and joints of right foot: Secondary | ICD-10-CM | POA: Diagnosis not present

## 2022-03-10 DIAGNOSIS — R262 Difficulty in walking, not elsewhere classified: Secondary | ICD-10-CM | POA: Diagnosis not present

## 2022-03-10 DIAGNOSIS — M76821 Posterior tibial tendinitis, right leg: Secondary | ICD-10-CM | POA: Diagnosis not present

## 2022-03-14 DIAGNOSIS — R262 Difficulty in walking, not elsewhere classified: Secondary | ICD-10-CM | POA: Diagnosis not present

## 2022-03-14 DIAGNOSIS — M25571 Pain in right ankle and joints of right foot: Secondary | ICD-10-CM | POA: Diagnosis not present

## 2022-03-14 DIAGNOSIS — M76821 Posterior tibial tendinitis, right leg: Secondary | ICD-10-CM | POA: Diagnosis not present

## 2022-03-15 ENCOUNTER — Ambulatory Visit (INDEPENDENT_AMBULATORY_CARE_PROVIDER_SITE_OTHER): Payer: Medicare HMO | Admitting: Podiatry

## 2022-03-15 DIAGNOSIS — M76821 Posterior tibial tendinitis, right leg: Secondary | ICD-10-CM | POA: Diagnosis not present

## 2022-03-15 NOTE — Progress Notes (Signed)
  Subjective:  Patient ID: Sarah Medina, female    DOB: December 07, 1965,  MRN: [id number]  Chief Complaint  Patient presents with   Tendonitis     RE-CHECK PT TENDONITIS    56 y.o. female presents with the above complaint. History confirmed with patient.  She has had quite a bit of improvement probably at 50% better than it was  Objective:  Physical Exam: warm, good capillary refill, no trophic changes or ulcerative lesions, normal DP and PT pulses, normal sensory exam, and she has mild pain with resisted inversion, no pain to palpation of the navicular tuberosity now   Radiographs: Multiple views x-ray of the right foot: Mild to moderate degenerative changes in the midfoot, she does have a prominent navicular tuberosity Assessment:   1. Posterior tibial tendon dysfunction (PTTD) of right lower extremity      Plan:  Patient was evaluated and treated and all questions answered.  Discussed the etiology and treatment options for posterior tibial tendinitis including stretching, formal physical therapy with an eccentric exercises therapy plan, supportive shoegears such as a running shoe or sneaker, bracing, topical and oral medications.  We also discussed that I do not routinely perform injections in this area because of the risk of an increased damage or rupture of the tendon.  We also discussed the role of surgical treatment of this for patients who do not improve after exhausting non-surgical treatment options.  Overall doing well and about 50% better.  I recommended she continue physical therapy she has a few more weeks of this to remain.  May begin to wean away from the Tri-Lock ankle brace as well and return to regular shoe gear.  She will return to see me as needed if it does not improve or worsens  Return if symptoms worsen or fail to improve.

## 2022-03-22 ENCOUNTER — Ambulatory Visit: Payer: Medicare HMO | Admitting: Podiatry

## 2022-03-24 DIAGNOSIS — R6889 Other general symptoms and signs: Secondary | ICD-10-CM | POA: Diagnosis not present

## 2022-04-24 DIAGNOSIS — R6889 Other general symptoms and signs: Secondary | ICD-10-CM | POA: Diagnosis not present

## 2022-05-08 DIAGNOSIS — Z1331 Encounter for screening for depression: Secondary | ICD-10-CM | POA: Diagnosis not present

## 2022-05-08 DIAGNOSIS — E782 Mixed hyperlipidemia: Secondary | ICD-10-CM | POA: Diagnosis not present

## 2022-05-08 DIAGNOSIS — Z0001 Encounter for general adult medical examination with abnormal findings: Secondary | ICD-10-CM | POA: Diagnosis not present

## 2022-05-08 DIAGNOSIS — I10 Essential (primary) hypertension: Secondary | ICD-10-CM | POA: Diagnosis not present

## 2022-05-08 DIAGNOSIS — Z23 Encounter for immunization: Secondary | ICD-10-CM | POA: Diagnosis not present

## 2022-05-08 DIAGNOSIS — R609 Edema, unspecified: Secondary | ICD-10-CM | POA: Diagnosis not present

## 2022-05-08 DIAGNOSIS — Z739 Problem related to life management difficulty, unspecified: Secondary | ICD-10-CM | POA: Diagnosis not present

## 2022-05-08 DIAGNOSIS — R7303 Prediabetes: Secondary | ICD-10-CM | POA: Diagnosis not present

## 2022-05-08 DIAGNOSIS — I2510 Atherosclerotic heart disease of native coronary artery without angina pectoris: Secondary | ICD-10-CM | POA: Diagnosis not present

## 2022-05-10 DIAGNOSIS — R6889 Other general symptoms and signs: Secondary | ICD-10-CM | POA: Diagnosis not present

## 2022-06-01 DIAGNOSIS — I10 Essential (primary) hypertension: Secondary | ICD-10-CM | POA: Diagnosis not present

## 2022-06-01 DIAGNOSIS — E782 Mixed hyperlipidemia: Secondary | ICD-10-CM | POA: Diagnosis not present

## 2022-06-06 DIAGNOSIS — H52223 Regular astigmatism, bilateral: Secondary | ICD-10-CM | POA: Diagnosis not present

## 2022-06-09 DIAGNOSIS — M1711 Unilateral primary osteoarthritis, right knee: Secondary | ICD-10-CM | POA: Diagnosis not present

## 2022-06-21 DIAGNOSIS — I10 Essential (primary) hypertension: Secondary | ICD-10-CM | POA: Diagnosis not present

## 2022-06-21 DIAGNOSIS — E782 Mixed hyperlipidemia: Secondary | ICD-10-CM | POA: Diagnosis not present

## 2022-06-21 DIAGNOSIS — R7303 Prediabetes: Secondary | ICD-10-CM | POA: Diagnosis not present

## 2022-06-21 DIAGNOSIS — E559 Vitamin D deficiency, unspecified: Secondary | ICD-10-CM | POA: Diagnosis not present

## 2022-08-01 DIAGNOSIS — I10 Essential (primary) hypertension: Secondary | ICD-10-CM

## 2022-08-01 DIAGNOSIS — E782 Mixed hyperlipidemia: Secondary | ICD-10-CM

## 2022-08-01 DIAGNOSIS — M1711 Unilateral primary osteoarthritis, right knee: Secondary | ICD-10-CM

## 2022-08-22 ENCOUNTER — Other Ambulatory Visit: Payer: Self-pay | Admitting: Family

## 2022-08-22 DIAGNOSIS — Z1231 Encounter for screening mammogram for malignant neoplasm of breast: Secondary | ICD-10-CM

## 2022-08-22 DIAGNOSIS — R6889 Other general symptoms and signs: Secondary | ICD-10-CM | POA: Diagnosis not present

## 2022-08-31 DIAGNOSIS — R6889 Other general symptoms and signs: Secondary | ICD-10-CM | POA: Diagnosis not present

## 2022-09-07 ENCOUNTER — Encounter: Payer: Self-pay | Admitting: Family

## 2022-09-07 ENCOUNTER — Ambulatory Visit (INDEPENDENT_AMBULATORY_CARE_PROVIDER_SITE_OTHER): Payer: Medicare HMO | Admitting: Family

## 2022-09-07 VITALS — BP 122/68 | HR 67 | Ht 66.5 in | Wt 234.0 lb

## 2022-09-07 DIAGNOSIS — E782 Mixed hyperlipidemia: Secondary | ICD-10-CM | POA: Diagnosis not present

## 2022-09-07 DIAGNOSIS — R7303 Prediabetes: Secondary | ICD-10-CM

## 2022-09-07 DIAGNOSIS — E039 Hypothyroidism, unspecified: Secondary | ICD-10-CM | POA: Diagnosis not present

## 2022-09-07 DIAGNOSIS — E559 Vitamin D deficiency, unspecified: Secondary | ICD-10-CM | POA: Diagnosis not present

## 2022-09-07 DIAGNOSIS — E538 Deficiency of other specified B group vitamins: Secondary | ICD-10-CM | POA: Diagnosis not present

## 2022-09-07 DIAGNOSIS — I10 Essential (primary) hypertension: Secondary | ICD-10-CM

## 2022-09-07 NOTE — Progress Notes (Signed)
Established Patient Office Visit  Subjective:  Patient ID: STARLETTE COHEA, female    DOB: 09-15-1965  Age: 57 y.o. MRN: [id number]  Chief Complaint  Patient presents with   Follow-up    4 month follow up    Patient is here for her 4 month f/u.   She has been feeling well since her last appointment.   Due for labs No other concerns at this time  HM Mammogram         Scheduled - MAMMOGRAM (Every 2 Years) Scheduled for 10/06/2022   09/29/2021  MM 3D SCREEN BREAST BILATERAL   Only the first 1 history entries have been loaded, but more history  exists.          Past Medical History:  Diagnosis Date   Arthritis    hips, right leg   CAD in native artery, RCA, LAD and LCX 07/23/2015   Carotid artery occlusion    H/O myocardial infarction less than 8 weeks- STEMI inf wall 07/20/15- stent to RCA 07/20/15   inf wall STEMI with DES to RCA   Hypercholesteremia    Hypertension    Periorbital edema    treated with steroids   Seizures ([place])    as teenager, ages [age].  none since   STEMI (ST elevation myocardial infarction) ([place]) 07/23/15    Past Surgical History:  Procedure Laterality Date   CARDIAC CATHETERIZATION N/A 07/20/2015   Procedure: Left Heart Cath and Coronary Angiography;  Surgeon: Isaias Cowman, MD;  Location: [facility];  Service: Cardiovascular;  Laterality: N/A;   CARDIAC CATHETERIZATION N/A 07/20/2015   Procedure: Coronary Stent Intervention;  Surgeon: Isaias Cowman, MD;  Location: [facility];  Service: Cardiovascular;  Laterality: N/A;   CARDIAC CATHETERIZATION N/A 07/23/2015   Procedure: Left Heart Cath and Coronary Angiography;  Surgeon: Lorretta Harp, MD;  Location: [facility];  Service: Cardiovascular;  Laterality: N/A;   CARDIAC CATHETERIZATION N/A 07/23/2015   Procedure: Coronary Balloon Angioplasty;  Surgeon: Lorretta Harp, MD;  Location: [facility];  Service: Cardiovascular;  Laterality: N/A;   CARDIAC  CATHETERIZATION N/A 07/12/2016   Procedure: Left Heart Cath;  Surgeon: Teodoro Spray, MD;  Location: [facility];  Service: Cardiovascular;  Laterality: N/A;   CARDIAC CATHETERIZATION N/A 07/12/2016   Procedure: Left Heart Cath and Coronary Angiography;  Surgeon: Isaias Cowman, MD;  Location: [facility];  Service: Cardiovascular;  Laterality: N/A;   CARDIAC CATHETERIZATION N/A 07/12/2016   Procedure: Intravascular Pressure Wire/FFR Study;  Surgeon: Isaias Cowman, MD;  Location: [facility];  Service: Cardiovascular;  Laterality: N/A;   CARDIAC CATHETERIZATION N/A 07/12/2016   Procedure: Coronary Stent Intervention;  Surgeon: Isaias Cowman, MD;  Location: [facility];  Service: Cardiovascular;  Laterality: N/A;   KNEE SURGERY Right 15 years ago    Social History   Socioeconomic History   Marital status: Married    Spouse name: Not on file   Number of children: Not on file   Years of education: Not on file   Highest education level: Not on file  Occupational History   Not on file  Tobacco Use   Smoking status: Never   Smokeless tobacco: Former    Types: Snuff    Quit date: 2014  Vaping Use   Vaping Use: Never used  Substance and Sexual Activity   Alcohol use: No   Drug use: Never   Sexual activity: Yes  Birth control/protection: None  Other Topics Concern   Not on file  Social History Narrative   Not on file   Social Determinants of Health   Financial Resource Strain: Not on file  Food Insecurity: Not on file  Transportation Needs: Not on file  Physical Activity: Not on file  Stress: Not on file  Social Connections: Not on file  Intimate Partner Violence: Not on file    Family History  Problem Relation Age of Onset   Breast cancer Sister 44    Allergies  Allergen Reactions   Enalapril Swelling    Swelling face    Review of Systems  HENT:  Positive for ear pain.   All other systems reviewed and are  negative.      Objective:   BP 122/68   Pulse 67   Ht 5' 6.5" (1.689 m)   Wt 234 lb (106.1 kg)   LMP 05/19/2015 (LMP Unknown)   SpO2 96%   BMI 37.20 kg/m   Vitals:   09/07/22 0912  BP: 122/68  Pulse: 67  Height: 5' 6.5" (1.689 m)  Weight: 234 lb (106.1 kg)  SpO2: 96%  BMI (Calculated): 37.21    Physical Exam Vitals and nursing note reviewed.  Constitutional:      Appearance: Normal appearance. She is obese.  HENT:     Head: Normocephalic.  Eyes:     Extraocular Movements: Extraocular movements intact.     Pupils: Pupils are equal, round, and reactive to light.  Cardiovascular:     Rate and Rhythm: Normal rate and regular rhythm.     Pulses: Normal pulses.     Heart sounds: Normal heart sounds.  Pulmonary:     Effort: Pulmonary effort is normal.     Breath sounds: Normal breath sounds.  Neurological:     Mental Status: She is alert.      No results found for any visits on 09/07/22.  No results found for this or any previous visit (from the past 2160 hour(s)).    Assessment & Plan:   Problem List Items Addressed This Visit     Hyperlipidemia - Primary   Relevant Orders   Lipid panel   Other Visit Diagnoses     Vitamin D deficiency, unspecified       Relevant Orders   VITAMIN D 25 Hydroxy (Vit-D Deficiency, Fractures)   B12 deficiency due to diet       Relevant Orders   Vitamin B12   Essential hypertension, benign       Relevant Orders   CBC With Differential   CMP14+EGFR   Hypothyroidism (acquired)       Relevant Orders   TSH   Prediabetes       Relevant Orders   Hemoglobin A1c       Return in about 4 months (around 01/07/2023) for CPE/PAP.   Total time spent: 30 minutes  Mechele Claude, FNP  09/07/2022

## 2022-09-13 DIAGNOSIS — H1045 Other chronic allergic conjunctivitis: Secondary | ICD-10-CM | POA: Diagnosis not present

## 2022-09-19 DIAGNOSIS — M79643 Pain in unspecified hand: Secondary | ICD-10-CM | POA: Diagnosis not present

## 2022-09-19 DIAGNOSIS — W19XXXA Unspecified fall, initial encounter: Secondary | ICD-10-CM | POA: Diagnosis not present

## 2022-09-19 DIAGNOSIS — M25539 Pain in unspecified wrist: Secondary | ICD-10-CM | POA: Diagnosis not present

## 2022-09-19 DIAGNOSIS — S63502A Unspecified sprain of left wrist, initial encounter: Secondary | ICD-10-CM | POA: Diagnosis not present

## 2022-09-19 DIAGNOSIS — S6392XA Sprain of unspecified part of left wrist and hand, initial encounter: Secondary | ICD-10-CM | POA: Diagnosis not present

## 2022-10-02 DIAGNOSIS — R6889 Other general symptoms and signs: Secondary | ICD-10-CM | POA: Diagnosis not present

## 2022-10-03 DIAGNOSIS — I10 Essential (primary) hypertension: Secondary | ICD-10-CM | POA: Diagnosis not present

## 2022-10-03 DIAGNOSIS — I252 Old myocardial infarction: Secondary | ICD-10-CM | POA: Diagnosis not present

## 2022-10-03 DIAGNOSIS — I259 Chronic ischemic heart disease, unspecified: Secondary | ICD-10-CM | POA: Diagnosis not present

## 2022-10-03 DIAGNOSIS — I2119 ST elevation (STEMI) myocardial infarction involving other coronary artery of inferior wall: Secondary | ICD-10-CM | POA: Diagnosis not present

## 2022-10-03 DIAGNOSIS — E785 Hyperlipidemia, unspecified: Secondary | ICD-10-CM | POA: Diagnosis not present

## 2022-10-03 DIAGNOSIS — I4901 Ventricular fibrillation: Secondary | ICD-10-CM | POA: Diagnosis not present

## 2022-10-03 DIAGNOSIS — I2510 Atherosclerotic heart disease of native coronary artery without angina pectoris: Secondary | ICD-10-CM | POA: Diagnosis not present

## 2022-10-03 DIAGNOSIS — Z955 Presence of coronary angioplasty implant and graft: Secondary | ICD-10-CM | POA: Diagnosis not present

## 2022-10-06 ENCOUNTER — Ambulatory Visit
Admission: RE | Admit: 2022-10-06 | Discharge: 2022-10-06 | Disposition: A | Discharge status: Home / Self Care | Payer: Medicare HMO | Source: Ambulatory Visit | Attending: Family | Admitting: Family

## 2022-10-06 DIAGNOSIS — Z1231 Encounter for screening mammogram for malignant neoplasm of breast: Secondary | ICD-10-CM | POA: Insufficient documentation

## 2022-10-13 ENCOUNTER — Ambulatory Visit
Admission: EM | Admit: 2022-10-13 | Discharge: 2022-10-13 | Disposition: A | Discharge status: Home / Self Care | Payer: Medicare HMO | Attending: Emergency Medicine | Admitting: Emergency Medicine

## 2022-10-13 ENCOUNTER — Encounter: Payer: Self-pay | Admitting: Emergency Medicine

## 2022-10-13 DIAGNOSIS — M545 Low back pain, unspecified: Secondary | ICD-10-CM | POA: Diagnosis not present

## 2022-10-13 LAB — URINALYSIS, W/ REFLEX TO CULTURE (INFECTION SUSPECTED)
Glucose, UA: NEGATIVE mg/dL
Hgb urine dipstick: NEGATIVE
Ketones, ur: 15 mg/dL — AB
Nitrite: NEGATIVE
Specific Gravity, Urine: 1.025 (ref 1.005–1.030)
pH: 6 (ref 5.0–8.0)

## 2022-10-13 MED ORDER — PREDNISONE 20 MG PO TABS: 40.0000 mg | ORAL_TABLET | Freq: Every day | ORAL | 0 refills | Status: DC

## 2022-10-13 MED ORDER — CYCLOBENZAPRINE HCL 10 MG PO TABS: 10.0000 mg | ORAL_TABLET | Freq: Two times a day (BID) | ORAL | 0 refills | Status: DC | PRN

## 2022-10-13 NOTE — ED Provider Notes (Signed)
[facility]    CSN: [id number] Arrival date & time: 10/13/22  1002      History   Chief Complaint Chief Complaint  Patient presents with   Back Pain    Left side    HPI Sarah Medina is a 57 y.o. female.   Patient presents for evaluation of constant right-sided back pain radiating to the right hip for 3 days.  Symptoms began after lifting objects.  Has limited range of motion.  Has attempted use of Tylenol but has been ineffective.  Denies numbness, tingling, urinary or bowel incontinence, urinary symptoms.      Past Medical History:  Diagnosis Date   Arthritis    hips, right leg   CAD in native artery, RCA, LAD and LCX 07/23/2015   Carotid artery occlusion    H/O myocardial infarction less than 8 weeks- STEMI inf wall 07/20/15- stent to RCA 07/20/15   inf wall STEMI with DES to RCA   Hypercholesteremia    Hypertension    Periorbital edema    treated with steroids   Seizures    as teenager, ages [age].  none since   STEMI (ST elevation myocardial infarction) 07/23/15    Patient Active Problem List   Diagnosis Date Noted   Strain of knee 09/09/2021   PMB (postmenopausal bleeding) 08/08/2019   Pain in limb 08/08/2019   Morbid obesity with BMI of 40.0-44.9, adult 05/02/2019   Peripheral edema 02/19/2019   Cellulitis of left lower extremity    UTI (urinary tract infection)    Leg swelling    Positive D dimer    Sepsis 12/25/2018   Chest pain, rule out acute myocardial infarction 07/11/2016   Ischemic heart disease 10/20/2015   Essential hypertension 10/20/2015   Hyperlipidemia 10/20/2015   H/O cardiac catheterization 08/02/2015   Presence of coronary angioplasty implant and graft 08/02/2015   Acute posthemorrhagic anemia    H/O myocardial infarction less than 8 weeks- STEMI inf wall 07/20/15- stent to RCA 07/23/2015   CAD in native artery, RCA, LAD and LCX 07/23/2015   Ventricular fibrillation 07/23/2015   ST elevation myocardial infarction (STEMI)  of inferior wall 07/20/2015   Abnormal LFTs 12/23/2014   Osteoarthritis of right knee 11/24/2014    Past Surgical History:  Procedure Laterality Date   CARDIAC CATHETERIZATION N/A 07/20/2015   Procedure: Left Heart Cath and Coronary Angiography;  Surgeon: Marcina Millard, MD;  Location: [facility];  Service: Cardiovascular;  Laterality: N/A;   CARDIAC CATHETERIZATION N/A 07/20/2015   Procedure: Coronary Stent Intervention;  Surgeon: Marcina Millard, MD;  Location: [facility];  Service: Cardiovascular;  Laterality: N/A;   CARDIAC CATHETERIZATION N/A 07/23/2015   Procedure: Left Heart Cath and Coronary Angiography;  Surgeon: Runell Gess, MD;  Location: [facility];  Service: Cardiovascular;  Laterality: N/A;   CARDIAC CATHETERIZATION N/A 07/23/2015   Procedure: Coronary Balloon Angioplasty;  Surgeon: Runell Gess, MD;  Location: [facility];  Service: Cardiovascular;  Laterality: N/A;   CARDIAC CATHETERIZATION N/A 07/12/2016   Procedure: Left Heart Cath;  Surgeon: Dalia Heading, MD;  Location: [facility];  Service: Cardiovascular;  Laterality: N/A;   CARDIAC CATHETERIZATION N/A 07/12/2016   Procedure: Left Heart Cath and Coronary Angiography;  Surgeon: Marcina Millard, MD;  Location: [facility];  Service: Cardiovascular;  Laterality: N/A;   CARDIAC CATHETERIZATION N/A 07/12/2016   Procedure: Intravascular Pressure Wire/FFR Study;  Surgeon: Marcina Millard, MD;  Location:  [facility];  Service: Cardiovascular;  Laterality: N/A;   CARDIAC CATHETERIZATION N/A 07/12/2016   Procedure: Coronary Stent Intervention;  Surgeon: Marcina Millard, MD;  Location: [facility];  Service: Cardiovascular;  Laterality: N/A;   KNEE SURGERY Right 15 years ago    OB History     Gravida  1   Para  0   Term      Preterm      AB      Living         SAB      IAB      Ectopic      Multiple      Live  Births               Home Medications    Prior to Admission medications   Medication Sig Start Date End Date Taking? Authorizing Provider  ammonium lactate (LAC-HYDRIN) 12 % cream Apply to feet twice a dat Patient not taking: Reported on 08/08/2019 03/10/19   Alford Highland, MD  aspirin EC 81 MG tablet Take 81 mg by mouth daily.    [provider]  atorvastatin (LIPITOR) 80 MG tablet Take 0.5 tablets (40 mg total) by mouth daily at 6 PM. 03/10/19   Alford Highland, MD  atorvastatin (LIPITOR) 80 MG tablet Take by mouth. 08/18/21   [provider]  cephALEXin (KEFLEX) 500 MG capsule Take 1 capsule (500 mg total) by mouth 3 (three) times daily. Patient not taking: Reported on 08/08/2019 06/30/19   Irean Hong, MD  cloNIDine (CATAPRES) 0.1 MG tablet Take 0.1 mg by mouth at bedtime. 11/15/21   [provider]  clopidogrel (PLAVIX) 75 MG tablet Take 1 tablet (75 mg total) by mouth daily. 01/26/16   Virl Axe, MD  ergocalciferol (VITAMIN D2) 1.25 MG (50000 UT) capsule Take 50,000 Units by mouth every Monday.    [provider]  ferrous sulfate 325 (65 FE) MG tablet Take 325 mg by mouth daily.    [provider]  fexofenadine (ALLEGRA) 180 MG tablet Take 180 mg by mouth daily.    [provider]  furosemide (LASIX) 20 MG tablet Take 20 mg by mouth daily.  01/02/19   [provider]  losartan (COZAAR) 50 MG tablet Take 50 mg by mouth daily.    [provider]  meloxicam (MOBIC) 15 MG tablet meloxicam 15 mg tablet  Take 1 tablet every day by oral route.    [provider]  metoprolol tartrate (LOPRESSOR) 25 MG tablet Take 0.5 tablets (12.5 mg total) by mouth 2 (two) times daily. 07/13/16   Milagros Loll, MD  nitroGLYCERIN (NITROSTAT) 0.4 MG SL tablet Place under the tongue. 08/24/21   [provider]  ondansetron (ZOFRAN ODT) 4 MG disintegrating tablet Take 1 tablet (4 mg total) by mouth every 8 (eight) hours as  needed. 06/25/19   Jene Every, MD  ranitidine (ZANTAC) 150 MG tablet Take by mouth. 06/24/15   [provider]  tolnaftate (TINACTIN) 1 % cream Apply 1 application topically 2 (two) times daily. Patient not taking: Reported on 08/08/2019 03/10/19   Alford Highland, MD    Family History Family History  Problem Relation Age of Onset   Breast cancer Sister 77    Social History Social History   Tobacco Use   Smoking status: Never   Smokeless tobacco: Former    Types: Snuff    Quit date: 2014  Vaping Use   Vaping Use:  Never used  Substance Use Topics   Alcohol use: No   Drug use: Never     Allergies   Enalapril   Review of Systems Review of Systems  Musculoskeletal:  Positive for back pain.     Physical Exam Triage Vital Signs ED Triage Vitals  Enc Vitals Group     BP 10/13/22 1042 102/68     Pulse Rate 10/13/22 1042 (!) 55     Resp 10/13/22 1042 14     Temp 10/13/22 1042 97.8 F (36.6 C)     Temp Source 10/13/22 1042 Oral     SpO2 10/13/22 1042 95 %     Weight 10/13/22 1041 230 lb (104.3 kg)     Height 10/13/22 1041 5\' 6"  (1.676 m)     Head Circumference --      Peak Flow --      Pain Score 10/13/22 1041 10     Pain Loc --      Pain Edu? --      Excl. in GC? --    No data found.  Updated Vital Signs BP 102/68 (BP Location: Right Arm)   Pulse (!) 55   Temp 97.8 F (36.6 C) (Oral)   Resp 14   Ht 5\' 6"  (1.676 m)   Wt 230 lb (104.3 kg)   LMP 05/19/2015 (LMP Unknown)   SpO2 95%   BMI 37.12 kg/m   Visual Acuity Right Eye Distance:   Left Eye Distance:   Bilateral Distance:    Right Eye Near:   Left Eye Near:    Bilateral Near:     Physical Exam Constitutional:      Appearance: Normal appearance.  Eyes:     Extraocular Movements: Extraocular movements intact.  Pulmonary:     Effort: Pulmonary effort is normal.  Musculoskeletal:     Comments: Tenderness is present to the bilateral lower back without tenderness to the spine, able  to twist and turn but pain is elicited when rotating to the back, able to bend about 45%, negative straight leg test  Skin:    General: Skin is warm and dry.  Neurological:     Mental Status: She is alert and oriented to person, place, and time. Mental status is at baseline.      UC Treatments / Results  Labs (all labs ordered are listed, but only abnormal results are displayed) Labs Reviewed  URINALYSIS, W/ REFLEX TO CULTURE (INFECTION SUSPECTED)    EKG   Radiology No results found.  Procedures Procedures (including critical care time)  Medications Ordered in UC Medications - No data to display  Initial Impression / Assessment and Plan / UC Course  I have reviewed the triage vital signs and the nursing notes.  Pertinent labs & imaging results that were available during my care of the patient were reviewed by me and considered in my medical decision making (see chart for details).  Lumbar back pain  Etiology is most likely muscular, symptoms started after physical activity, urinalysis showing leukocytes, negative for nitrates, if patient is asymptomatic will defer use of antibiotic, declined Toradol injection in office and prescribed prednisone and Flexeril for outpatient use, recommended RICE, heat massage stretching and activity as tolerated, may follow-up with urgent care or primary doctor if symptoms persist or worsen Final Clinical Impressions(s) / UC Diagnoses   Final diagnoses:  None   Discharge Instructions   None    ED Prescriptions   None    PDMP not reviewed  this encounter.   Valinda Hoar, NP 10/13/22 1136

## 2022-10-13 NOTE — Discharge Instructions (Signed)
Your pain is most likely caused by irritation to the muscles as it started after you were lifting objects  Urinalysis is pending, you will be notified of positive test results only, antibiotic will be sent in at time of notification, if you do not hear from me by the end of the day testing was negative  Begin use of prednisone every morning with food for 5 days to reduce inflammation which will help with your pain, you may take Tylenol 500 to 1000 mg every 6 hours in addition to this as needed  You may use muscle relaxant twice daily as needed as may make you feel sleepy  You may use heating pad in 15 minute intervals as needed for additional comfort, or you may find comfort in using ice in 10-15 minutes over affected area  Begin stretching affected area daily for 10 minutes as tolerated to further loosen muscles   When lying down place pillow underneath and between knees for support  Can try sleeping without pillow on firm mattress   Practice good posture: head back, shoulders back, chest forward, pelvis back and weight distributed evenly on both legs  If pain persist after recommended treatment or reoccurs if may be beneficial to follow up with orthopedic specialist for evaluation, this doctor specializes in the bones and can manage your symptoms long-term with options such as but not limited to imaging, medications or physical therapy

## 2022-10-13 NOTE — ED Triage Notes (Signed)
Patient c/o left sided lower back pain that started on Tuesday.  Patient reports the pain radiates to her right hip.  Patient reports muscle tightness and spasms.  Patient has been taking Tylenol for the pain.

## 2022-10-16 ENCOUNTER — Ambulatory Visit: Payer: Self-pay

## 2022-10-16 DIAGNOSIS — R6889 Other general symptoms and signs: Secondary | ICD-10-CM | POA: Diagnosis not present

## 2022-11-06 ENCOUNTER — Ambulatory Visit: Payer: Medicare HMO

## 2022-11-06 DIAGNOSIS — E785 Hyperlipidemia, unspecified: Secondary | ICD-10-CM

## 2022-11-06 DIAGNOSIS — I10 Essential (primary) hypertension: Secondary | ICD-10-CM

## 2022-11-06 NOTE — Chronic Care Management (AMB) (Signed)
Follow Up Pharmacist Visit (CCM)   Clinical Summary  Next CCM Follow Up: FPO in November (Prefers call in AM) Next AWV: 01/09/23 Summary for PCP:  - HC to do general assessment in 3 months. - Recommended Colonoscopy and Shingles Vaccine. Sarah Medina clinic for clarification on Plavix (Patient reports being taken off, visit notes indicate otherwise)   . Patient's Chronic Conditions: Cardiovascular Disease (CVD), Hypertension (HTN), Osteoarthritis, Edema, Hyperlipidemia/Dyslipidemia (HLD) Engagement Notes Sarah Medina, Sarah Medina on 10/09/2022 03:54 PM CTL Prep-18 mins . Disease Assessments Subjective Information Visit Completed on: 11/06/2022 Subjective: She drinks 3-4bottles a day, some soda, cranberry juice. She cooks at home- BF eggs, Wings, oatmeal frits. She eats some vegetables in can or frozen. Fruits some, dairy products some. Exercise: they walk around town to drug store, Engineering geologist. No alcohol or smoking. 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 What is the patient's sleep pattern?: No sleep issues How many hours per night does patient typically sleep?: 9-10hours SDOH: Accountable Health Communities Health-Related Social Needs Screening Tool ([employer]) SDOH questions were documented and reviewed (EMR or [employer]) within the past 12 months or since hospitalization?: No What is your living situation today? (ref #1): I have a steady place to live Think about the place you live. Do you have problems with any of the following? (ref #2): Mold, Water leaks Within the past 12 months, you worried that your food would run out before you got money to buy more (ref #3): Never true Within the past 12 months, the food you bought just didn't last and you didn't have money to get more (ref #4): Never true In the past 12 months, has lack of reliable transportation kept you from medical appointments, meetings, work or from getting things  needed for daily living? (ref #5): No In the past 12 months, has the electric, gas, oil, or water company threatened to shut off services in your home? (ref #6): No How often does anyone, including family and friends, physically hurt you? (ref #7): Never (1) How often does anyone, including family and friends, insult or talk down to you? (ref #8): Never (1) How often does anyone, including friends and family, threaten you with harm? (ref #9): Never (1) How often does anyone, including family and friends, scream or curse at you? (ref #10): Never (1) Medication Adherence Does the [facility] have access to medication refill history?: No Is Patient using [facility] pharmacy?: No Name and location of Current pharmacy: [facility] Current Rx insurance plan: [employer] Are meds synced by current pharmacy?: Yes Are meds delivered by current pharmacy?: No - delivery not available Assessment:: Adherent . Hypertension (HTN) Most Recent BP: 122/68 Most Recent HR: 67 taken on: 09/07/2022 Care Gap: Need BP documented or last BP 140/90 or higher: Needs to be addressed Assessed today?: Yes BP today is: 142/82 Goal: <130/80 mmHG Is Patient checking BP at home?: Yes Patient home BP readings are ranging: 120-130/80 Has patient experienced hypotension, dizziness, falls or bradycardia?: No We discussed: DASH diet:  following a diet emphasizing fruits and vegetables and low-fat dairy products along with whole grains, fish, poultry, and nuts. Reducing red meats and sugars., Proper Home BP Measurement, Increasing exercise (walking, biking, swimming) to a goal of 30 minutes per day, as able based on current activity level and health or as directed by your healthcare provider., Contacting PCP office for signs and symptoms of high or low blood pressure (hypotension, dizziness, falls, headaches, edema) Assessment:: Controlled Drug: Clondine 0.1mg -At bedtime Pharmacist Assessment: Appropriate, Effective, Safe, Accessible Drug:  Losartan 50mg -Once daily Pharmacist Assessment: Appropriate, Effective, Safe, Accessible Drug: Metoprolol tart 25mg -1/2 tablet twice daily Pharmacist Assessment: Appropriate, Effective, Safe, Accessible Plan/Follow up: Continue to check BP at home . Hyperlipidemia/Dyslipidemia (HLD) Last Lipid panel on: 01/05/2022 TC (Goal<200): 149 LDL: 82 HDL (Goal>40): 51 TG (Goal<150): 84 ASCVD 10-year risk?is:: Low (<5%) ASCVD Risk Score: 3.5% Assessed today?: Yes LDL Goal: <100 We discussed: How a diet high in fruits/vegetables/nuts/whole grains/beans may help to reduce your cholesterol. Increasing soluble fiber intake.  Avoiding sugary foods and trans fat, limiting carbohydrates, and reducing portion sizes. Recommended increasing intake of healthy fats into their diet, Increasing exercise (walking, biking, swimming) to a goal of 30 minutes per day, as able based on current activity level and health or as directed by your healthcare provider Assessment:: Controlled Drug: Atorvastatin 80mg  Pharmacist Assessment: Appropriate, Effective, Safe, Accessible . Cardiovascular Disease (CVD) Care Gap: Moderate / high intensity statin therapy needed: Addressed Assessed today?: No Drug: Clopidogrel 75mg -Once daily Pharmacist Assessment: Query Appropriateness Plan/Follow up: Patient reports she was stopped off Plavix. However, cardiology notes indicate she has been resumed. I called and left a message with [facility] cardiology for clarification. [phone] ) Osteoarthritis Assessed today?: No Preventative Health Care Gap: Colorectal cancer screening: Needs to be addressed Care Gap: Breast cancer screening: Addressed Care Gap: Annual Wellness Visit (AWV): Addressed Immunizations needed: Zoster Plan/Follow up: Recommended shingles vaccine and colonoscopy . Pharmacy Interventions Intervention Details Pharmacist Interventions discussed: Yes Started Therapy: Preventative therapy Monitoring:  Overdue labs, Preventative health screenings . Sarah Medina,PharmD Office visit Documentation 20 mins (including coordinating care with other providers)

## 2022-11-22 ENCOUNTER — Other Ambulatory Visit: Payer: Medicare HMO

## 2022-11-22 DIAGNOSIS — I10 Essential (primary) hypertension: Secondary | ICD-10-CM | POA: Diagnosis not present

## 2022-11-22 DIAGNOSIS — E538 Deficiency of other specified B group vitamins: Secondary | ICD-10-CM | POA: Diagnosis not present

## 2022-11-22 DIAGNOSIS — E559 Vitamin D deficiency, unspecified: Secondary | ICD-10-CM | POA: Diagnosis not present

## 2022-11-22 DIAGNOSIS — E039 Hypothyroidism, unspecified: Secondary | ICD-10-CM | POA: Diagnosis not present

## 2022-11-22 DIAGNOSIS — E782 Mixed hyperlipidemia: Secondary | ICD-10-CM | POA: Diagnosis not present

## 2022-11-22 DIAGNOSIS — R7303 Prediabetes: Secondary | ICD-10-CM | POA: Diagnosis not present

## 2022-11-23 LAB — CBC WITH DIFFERENTIAL
Basophils Absolute: 0 10*3/uL (ref 0.0–0.2)
Basos: 1 %
EOS (ABSOLUTE): 0.1 10*3/uL (ref 0.0–0.4)
Eos: 1 %
Hematocrit: 40.4 % (ref 34.0–46.6)
Hemoglobin: 12.9 g/dL (ref 11.1–15.9)
Immature Grans (Abs): 0.1 10*3/uL (ref 0.0–0.1)
Immature Granulocytes: 1 %
Lymphocytes Absolute: 1.8 10*3/uL (ref 0.7–3.1)
Lymphs: 29 %
MCH: 28.2 pg (ref 26.6–33.0)
MCHC: 31.9 g/dL (ref 31.5–35.7)
MCV: 88 fL (ref 79–97)
Monocytes Absolute: 0.5 10*3/uL (ref 0.1–0.9)
Monocytes: 7 %
Neutrophils Absolute: 3.9 10*3/uL (ref 1.4–7.0)
Neutrophils: 61 %
RBC: 4.58 x10E6/uL (ref 3.77–5.28)
RDW: 14.2 % (ref 11.7–15.4)
WBC: 6.3 10*3/uL (ref 3.4–10.8)

## 2022-11-23 LAB — CMP14+EGFR
ALT: 10 IU/L (ref 0–32)
AST: 16 IU/L (ref 0–40)
Albumin/Globulin Ratio: 1.6 (ref 1.2–2.2)
Albumin: 4 g/dL (ref 3.8–4.9)
Alkaline Phosphatase: 97 IU/L (ref 44–121)
BUN/Creatinine Ratio: 20 (ref 9–23)
BUN: 18 mg/dL (ref 6–24)
Bilirubin Total: 0.5 mg/dL (ref 0.0–1.2)
CO2: 25 mmol/L (ref 20–29)
Calcium: 9.1 mg/dL (ref 8.7–10.2)
Chloride: 105 mmol/L (ref 96–106)
Creatinine, Ser: 0.88 mg/dL (ref 0.57–1.00)
Globulin, Total: 2.5 g/dL (ref 1.5–4.5)
Glucose: 91 mg/dL (ref 70–99)
Potassium: 4.4 mmol/L (ref 3.5–5.2)
Sodium: 141 mmol/L (ref 134–144)
Total Protein: 6.5 g/dL (ref 6.0–8.5)
eGFR: 76 mL/min/{1.73_m2} (ref >59)

## 2022-11-23 LAB — LIPID PANEL
Chol/HDL Ratio: 2.5 ratio (ref 0.0–4.4)
Cholesterol, Total: 142 mg/dL (ref 100–199)
HDL: 57 mg/dL (ref >39)
LDL Chol Calc (NIH): 74 mg/dL (ref 0–99)
Triglycerides: 51 mg/dL (ref 0–149)
VLDL Cholesterol Cal: 11 mg/dL (ref 5–40)

## 2022-11-23 LAB — HEMOGLOBIN A1C
Est. average glucose Bld gHb Est-mCnc: 131 mg/dL
Hgb A1c MFr Bld: 6.2 % — ABNORMAL HIGH (ref 4.8–5.6)

## 2022-11-23 LAB — TSH: TSH: 1.18 u[IU]/mL (ref 0.450–4.500)

## 2022-11-23 LAB — VITAMIN B12: Vitamin B-12: 556 pg/mL (ref 232–1245)

## 2022-11-23 LAB — VITAMIN D 25 HYDROXY (VIT D DEFICIENCY, FRACTURES): Vit D, 25-Hydroxy: 29.2 ng/mL — ABNORMAL LOW (ref 30.0–100.0)

## 2022-11-24 ENCOUNTER — Other Ambulatory Visit: Payer: Self-pay | Admitting: Family

## 2022-12-01 DIAGNOSIS — I10 Essential (primary) hypertension: Secondary | ICD-10-CM | POA: Diagnosis not present

## 2022-12-01 DIAGNOSIS — M1711 Unilateral primary osteoarthritis, right knee: Secondary | ICD-10-CM | POA: Diagnosis not present

## 2022-12-01 DIAGNOSIS — E782 Mixed hyperlipidemia: Secondary | ICD-10-CM | POA: Diagnosis not present

## 2022-12-19 DIAGNOSIS — Z961 Presence of intraocular lens: Secondary | ICD-10-CM | POA: Diagnosis not present

## 2022-12-19 DIAGNOSIS — H2512 Age-related nuclear cataract, left eye: Secondary | ICD-10-CM | POA: Diagnosis not present

## 2022-12-19 DIAGNOSIS — Z0100 Encounter for examination of eyes and vision without abnormal findings: Secondary | ICD-10-CM | POA: Diagnosis not present

## 2022-12-19 DIAGNOSIS — H269 Unspecified cataract: Secondary | ICD-10-CM | POA: Diagnosis not present

## 2023-01-09 ENCOUNTER — Ambulatory Visit (INDEPENDENT_AMBULATORY_CARE_PROVIDER_SITE_OTHER): Payer: Medicare HMO | Admitting: Family

## 2023-01-09 ENCOUNTER — Encounter: Payer: Self-pay | Admitting: Family

## 2023-01-09 VITALS — BP 110/68 | HR 61 | Ht 66.5 in | Wt 249.0 lb

## 2023-01-09 DIAGNOSIS — I10 Essential (primary) hypertension: Secondary | ICD-10-CM

## 2023-01-09 DIAGNOSIS — E538 Deficiency of other specified B group vitamins: Secondary | ICD-10-CM | POA: Diagnosis not present

## 2023-01-09 DIAGNOSIS — R7303 Prediabetes: Secondary | ICD-10-CM | POA: Insufficient documentation

## 2023-01-09 DIAGNOSIS — R5383 Other fatigue: Secondary | ICD-10-CM | POA: Diagnosis not present

## 2023-01-09 DIAGNOSIS — Z202 Contact with and (suspected) exposure to infections with a predominantly sexual mode of transmission: Secondary | ICD-10-CM | POA: Diagnosis not present

## 2023-01-09 DIAGNOSIS — Z124 Encounter for screening for malignant neoplasm of cervix: Secondary | ICD-10-CM | POA: Diagnosis not present

## 2023-01-09 DIAGNOSIS — Z0000 Encounter for general adult medical examination without abnormal findings: Secondary | ICD-10-CM

## 2023-01-09 DIAGNOSIS — I739 Peripheral vascular disease, unspecified: Secondary | ICD-10-CM

## 2023-01-09 DIAGNOSIS — E782 Mixed hyperlipidemia: Secondary | ICD-10-CM

## 2023-01-09 DIAGNOSIS — E559 Vitamin D deficiency, unspecified: Secondary | ICD-10-CM | POA: Diagnosis not present

## 2023-01-09 DIAGNOSIS — I25119 Atherosclerotic heart disease of native coronary artery with unspecified angina pectoris: Secondary | ICD-10-CM

## 2023-01-09 NOTE — Assessment & Plan Note (Signed)
Patient stable.  Well controlled with current therapy.   Continue current meds.  

## 2023-01-09 NOTE — Assessment & Plan Note (Signed)
Checking labs today.  Will continue supplements as needed.  

## 2023-01-09 NOTE — Patient Instructions (Signed)
Health Maintenance, Female Adopting a healthy lifestyle and getting preventive care are important in promoting health and wellness. Ask your health care provider about: The right schedule for you to have regular tests and exams. Things you can do on your own to prevent diseases and keep yourself healthy. What should I know about diet, weight, and exercise? Eat a healthy diet  Eat a diet that includes plenty of vegetables, fruits, low-fat dairy products, and lean protein. Do not eat a lot of foods that are high in solid fats, added sugars, or sodium. Maintain a healthy weight Body mass index (BMI) is used to identify weight problems. It estimates body fat based on height and weight. Your health care provider can help determine your BMI and help you achieve or maintain a healthy weight. Get regular exercise Get regular exercise. This is one of the most important things you can do for your health. Most adults should: Exercise for at least 150 minutes each week. The exercise should increase your heart rate and make you sweat (moderate-intensity exercise). Do strengthening exercises at least twice a week. This is in addition to the moderate-intensity exercise. Spend less time sitting. Even light physical activity can be beneficial. Watch cholesterol and blood lipids Have your blood tested for lipids and cholesterol at 58 years of age, then have this test every 5 years. Have your cholesterol levels checked more often if: Your lipid or cholesterol levels are high. You are older than 58 years of age. You are at high risk for heart disease. What should I know about cancer screening? Depending on your health history and family history, you may need to have cancer screening at various ages. This may include screening for: Breast cancer. Cervical cancer. Colorectal cancer. Skin cancer. Lung cancer. What should I know about heart disease, diabetes, and high blood pressure? Blood pressure and heart  disease High blood pressure causes heart disease and increases the risk of stroke. This is more likely to develop in people who have high blood pressure readings or are overweight. Have your blood pressure checked: Every 3-5 years if you are 58-58 years of age. Every year if you are 58 years old or older. Diabetes Have regular diabetes screenings. This checks your fasting blood sugar level. Have the screening done: Once every three years after age 23 if you are at a normal weight and have a low risk for diabetes. More often and at a younger age if you are overweight or have a high risk for diabetes. What should I know about preventing infection? Hepatitis B If you have a higher risk for hepatitis B, you should be screened for this virus. Talk with your health care provider to find out if you are at risk for hepatitis B infection. Hepatitis C Testing is recommended for: Everyone born from [birth year] through 1966. Anyone with known risk factors for hepatitis C. Sexually transmitted infections (STIs) Get screened for STIs, including gonorrhea and chlamydia, if: You are sexually active and are younger than 58 years of age. You are older than 58 years of age and your health care provider tells you that you are at risk for this type of infection. Your sexual activity has changed since you were last screened, and you are at increased risk for chlamydia or gonorrhea. Ask your health care provider if you are at risk. Ask your health care provider about whether you are at high risk for HIV. Your health care provider may recommend a prescription medicine to help prevent HIV  infection. If you choose to take medicine to prevent HIV, you should first get tested for HIV. You should then be tested every 3 months for as long as you are taking the medicine. Pregnancy If you are about to stop having your period (premenopausal) and you may become pregnant, seek counseling before you get pregnant. Take 400 to 800  micrograms (mcg) of folic acid every day if you become pregnant. Ask for birth control (contraception) if you want to prevent pregnancy. Osteoporosis and menopause Osteoporosis is a disease in which the bones lose minerals and strength with aging. This can result in bone fractures. If you are 58 years old or older, or if you are at risk for osteoporosis and fractures, ask your health care provider if you should: Be screened for bone loss. Take a calcium or vitamin D supplement to lower your risk of fractures. Be given hormone replacement therapy (HRT) to treat symptoms of menopause. Follow these instructions at home: Alcohol use Do not drink alcohol if: Your health care provider tells you not to drink. You are pregnant, may be pregnant, or are planning to become pregnant. If you drink alcohol: Limit how much you have to: 0-1 drink a day. Know how much alcohol is in your drink. In the [place] one drink equals one 12 oz bottle of beer (355 mL), one 5 oz glass of wine (148 mL), or one 1 oz glass of hard liquor (44 mL). Lifestyle Do not use any products that contain nicotine or tobacco. These products include cigarettes, chewing tobacco, and vaping devices, such as e-cigarettes. If you need help quitting, ask your health care provider. Do not use street drugs. Do not share needles. Ask your health care provider for help if you need support or information about quitting drugs. General instructions Schedule regular health, dental, and eye exams. Stay current with your vaccines. Tell your health care provider if: You often feel depressed. You have ever been abused or do not feel safe at home. Summary Adopting a healthy lifestyle and getting preventive care are important in promoting health and wellness. Follow your health care provider's instructions about healthy diet, exercising, and getting tested or screened for diseases. Follow your health care provider's instructions on monitoring your  cholesterol and blood pressure. This information is not intended to replace advice given to you by your health care provider. Make sure you discuss any questions you have with your health care provider. Document Revised: 11/08/2020 Document Reviewed: 11/08/2020 Elsevier Patient Education  2024 Else[employer] Inc.  Mantenimiento de la salud en las mujeres Health Maintenance, Female Adoptar un estilo de vida saludable y recibir atencin preventiva son importantes para promover la salud y Counsellor. Consulte al mdico sobre: El esquema adecuado para hacerse pruebas y exmenes peridicos. Cosas que puede hacer por su cuenta para prevenir enfermedades y [place] sano. Qu debo saber sobre la dieta, el peso y el ejercicio? Consuma una dieta saludable  Consuma una dieta que incluya muchas verduras, frutas, productos lcteos con bajo contenido de [place] y Associate Professor. No consuma muchos alimentos ricos en grasas slidas, azcares agregados o sodio. Mantenga un peso saludable El ndice de masa muscular [facility]) se [place] para identificar problemas de [place]. Proporciona una estimacin de la grasa corporal basndose en el peso y la altura. Su mdico puede ayudarle a Engineer, site [employer] y a Personnel officer o Pharmacologist un peso saludable. Haga ejercicio con regularidad Haga ejercicio con regularidad. Esta es una de las prcticas ms importantes que puede hacer por  su salud. La mayora de los adultos deben seguir estas pautas: Education officer, environmental, al menos, de actividad fsica por semana. El ejercicio debe aumentar la frecuencia cardaca y Media planner transpirar (ejercicio de intensidad moderada). Hacer ejercicios de fortalecimiento por lo [employer] por semana. Agregue esto a su plan de ejercicio de intensidad moderada. Pase menos tiempo sentada. Incluso la actividad fsica ligera puede ser beneficiosa. Controle sus niveles de colesterol y lpidos en la sangre Comience a realizarse anlisis de lpidos y Oncologist  en la sangre a los 20aos y luego reptalos cada 5aos. Hgase controlar los niveles de colesterol con mayor frecuencia si: Sus niveles de lpidos y colesterol son altos. Es mayor de 40aos. Presenta un alto riesgo de padecer enfermedades cardacas. Qu debo saber sobre las pruebas de deteccin del cncer? Segn su historia clnica y sus antecedentes familiares, es posible que deba realizarse pruebas de deteccin del cncer en diferentes edades. Esto puede incluir pruebas de deteccin de lo siguiente: Cncer de mama. Cncer de cuello uterino. Cncer colorrectal. Cncer de piel. Cncer de pulmn. Qu debo saber sobre la enfermedad cardaca, la diabetes y la hipertensin arterial? Presin arterial y enfermedad cardaca La hipertensin arterial causa enfermedades cardacas y [place] el riesgo de accidente cerebrovascular. Es ms probable que esto se manifieste en las personas que tienen lecturas de presin arterial alta o tienen sobrepeso. Hgase controlar la presin arterial: Cada 3 a 5 aos si tiene entre 18 y 37 aos. Todos los aos si es mayor de [place]. Diabetes Realcese exmenes de deteccin de la diabetes con regularidad. Este anlisis revisa el nivel de azcar en la sangre en [place]. Hgase las pruebas de deteccin: Cada tresaos despus de los 40aos de edad si tiene un peso normal y un bajo riesgo de padecer diabetes. Con ms frecuencia y a partir de [place] edad inferior si tiene sobrepeso o un alto riesgo de padecer diabetes. Qu debo saber sobre la prevencin de infecciones? Hepatitis B Si tiene un riesgo ms alto de contraer hepatitis B, debe someterse a un examen de deteccin de este virus. Hable con el mdico para averiguar si tiene riesgo de contraer la infeccin por hepatitis B. Hepatitis C Se recomienda el anlisis a: [employer] 1945 y 1965. Todas las personas que tengan un riesgo de haber contrado hepatitis C. Enfermedades de transmisin sexual  (ETS) Hgase las pruebas de Airline pilot de ITS, incluidas la gonorrea y la clamidia, si: Es sexualmente activa y es menor de [place]. Es mayor de 24aos, y Public affairs consultant informa que corre riesgo de tener este tipo de infecciones. La actividad sexual ha cambiado desde que le hicieron la ltima prueba de deteccin y tiene un riesgo mayor de Warehouse manager clamidia o Copy. Pregntele al mdico si usted tiene riesgo. Pregntele al mdico si usted tiene un alto riesgo de Primary school teacher VIH. El mdico tambin puede recomendarle un medicamento recetado para ayudar a evitar la infeccin por el VIH. Si elige tomar medicamentos para prevenir el VIH, primero debe [employer] de deteccin del VIH. Luego debe hacerse anlisis cada mientras est tomando los medicamentos. Embarazo Si est por dejar de Armed forces training and education officer (fase premenopusica) y usted puede quedar [place], busque asesoramiento antes de [place]. Tome de 400 a (mcg) de cido [employer] si [place]. Pida mtodos de control de la natalidad (anticonceptivos) si desea evitar un embarazo no deseado. Osteoporosis y [place] La osteoporosis es una enfermedad en la que los huesos pierden los minerales y la  fuerza por el avance de la edad. El resultado pueden ser fracturas en los [place]. Si tiene 65aos o ms, o si est en riesgo de sufrir osteoporosis y fracturas, pregunte a su mdico si debe: Hacerse pruebas de deteccin de prdida sea. Tomar un suplemento de calcio o de vitamina D para reducir el riesgo de fracturas. Recibir terapia de reemplazo hormonal (TRH) para tratar los sntomas de la menopausia. Siga estas indicaciones en su casa: Consumo de alcohol No beba alcohol si: Su mdico le indica no hacerlo. Est embarazada, puede estar embarazada o est tratando de [place]. Si bebe alcohol: Limite la cantidad que bebe a lo siguiente: De 0 a 1 bebida por da. Sepa cunta cantidad de alcohol hay  en las bebidas que toma. En los [place], una medida equivale a una botella de cerveza de 12oz ( ), un vaso de vino de 5oz ( ) o un vaso de una bebida alcohlica de alta graduacin de 1oz (44ml). Estilo de vida No consuma ningn producto que contenga nicotina o tabaco. Estos productos incluyen cigarrillos, tabaco para Theatre manager y aparatos de vapeo, como los Administrator, Civil Service. Si necesita ayuda para dejar de consumir estos productos, consulte al mdico. No consuma drogas. No comparta agujas. Solicite ayuda a su mdico si necesita apoyo o informacin para abandonar las drogas. Indicaciones generales Realcese los estudios de rutina de [address], dentales y de Wellsite geologist. Mantngase al da con las vacunas. Infrmele a su mdico si: Se siente deprimida con frecuencia. Alguna vez ha sido vctima de [place] o no se siente seguro en su casa. Resumen Adoptar un estilo de vida saludable y recibir atencin preventiva son importantes para promover la salud y Counsellor. Siga las instrucciones del mdico acerca de una dieta saludable, el ejercicio y la realizacin de pruebas o exmenes para Hotel manager. Siga las instrucciones del mdico con respecto al control del colesterol y la presin arterial. Esta informacin no tiene Theme park manager el consejo del mdico. Asegrese de hacerle al mdico cualquier pregunta que tenga. Document Revised: 11/25/2020 Document Reviewed: 11/25/2020 Elsevier Patient Education  2024 [employer].

## 2023-01-09 NOTE — Assessment & Plan Note (Signed)
Checking labs today.  Continue current therapy for lipid control. Will modify as needed based on labwork results.  

## 2023-01-09 NOTE — Progress Notes (Signed)
Complete physical exam  Patient: Sarah Medina   DOB: 1966-05-03   57 y.o. Female  MRN: [id number]  Subjective:    Chief Complaint  Patient presents with   Annual Exam    CPE & PAP    Sarah Medina is a 57 y.o. female who presents today for a complete physical exam. She reports consuming a general diet. The patient does not participate in regular exercise at present. She generally feels well. She reports sleeping fairly well. She does have additional problems to discuss today.    Past Medical History:  Diagnosis Date   Arthritis    hips, right leg   CAD in native artery, RCA, LAD and LCX 07/23/2015   Carotid artery occlusion    Cellulitis of left lower extremity    Chest pain, rule out acute myocardial infarction 07/11/2016   H/O myocardial infarction less than 8 weeks- STEMI inf wall 07/20/15- stent to RCA 07/20/2015   inf wall STEMI with DES to RCA   Hypercholesteremia    Hypertension    Periorbital edema    treated with steroids   Positive D dimer    Seizures (HCC)    as teenager, ages [age].  none since   Sepsis (HCC) 12/25/2018   ST elevation myocardial infarction (STEMI) of inferior wall (HCC) 07/20/2015   STEMI (ST elevation myocardial infarction) (HCC) 07/23/2015   UTI (urinary tract infection)    Ventricular fibrillation (HCC) 07/23/2015    Past Surgical History:  Procedure Laterality Date   CARDIAC CATHETERIZATION N/A 07/20/2015   Procedure: Left Heart Cath and Coronary Angiography;  Surgeon: Marcina Millard, MD;  Location: [facility] INVASIVE CV LAB;  Service: Cardiovascular;  Laterality: N/A;   CARDIAC CATHETERIZATION N/A 07/20/2015   Procedure: Coronary Stent Intervention;  Surgeon: Marcina Millard, MD;  Location: [facility] INVASIVE CV LAB;  Service: Cardiovascular;  Laterality: N/A;   CARDIAC CATHETERIZATION N/A 07/23/2015   Procedure: Left Heart Cath and Coronary Angiography;  Surgeon: Runell Gess, MD;  Location: [facility] INVASIVE CV LAB;  Service:  Cardiovascular;  Laterality: N/A;   CARDIAC CATHETERIZATION N/A 07/23/2015   Procedure: Coronary Balloon Angioplasty;  Surgeon: Runell Gess, MD;  Location: [facility] INVASIVE CV LAB;  Service: Cardiovascular;  Laterality: N/A;   CARDIAC CATHETERIZATION N/A 07/12/2016   Procedure: Left Heart Cath;  Surgeon: Dalia Heading, MD;  Location: [facility] INVASIVE CV LAB;  Service: Cardiovascular;  Laterality: N/A;   CARDIAC CATHETERIZATION N/A 07/12/2016   Procedure: Left Heart Cath and Coronary Angiography;  Surgeon: Marcina Millard, MD;  Location: [facility] INVASIVE CV LAB;  Service: Cardiovascular;  Laterality: N/A;   CARDIAC CATHETERIZATION N/A 07/12/2016   Procedure: Intravascular Pressure Wire/FFR Study;  Surgeon: Marcina Millard, MD;  Location: [facility] INVASIVE CV LAB;  Service: Cardiovascular;  Laterality: N/A;   CARDIAC CATHETERIZATION N/A 07/12/2016   Procedure: Coronary Stent Intervention;  Surgeon: Marcina Millard, MD;  Location: [facility] INVASIVE CV LAB;  Service: Cardiovascular;  Laterality: N/A;   KNEE SURGERY Right 15 years ago    Family History  Problem Relation Age of Onset   Breast cancer Sister 61    Social History   Socioeconomic History   Marital status: Married    Spouse name: Not on file   Number of children: Not on file   Years of education: Not on file   Highest education level: Not on file  Occupational History   Not on file  Tobacco Use   Smoking status: Never   Smokeless tobacco: Former  Types: Snuff    Quit date: 2014  Vaping Use   Vaping Use: Never used  Substance and Sexual Activity   Alcohol use: No   Drug use: Never   Sexual activity: Yes    Birth control/protection: None  Other Topics Concern   Not on file  Social History Narrative   Not on file   Social Determinants of Health   Financial Resource Strain: Not on file  Food Insecurity: Not on file  Transportation Needs: Not on file  Physical Activity: Not on file  Stress: Not on file  Social  Connections: Not on file  Intimate Partner Violence: Not on file    Outpatient Medications Prior to Visit  Medication Sig   aspirin EC 81 MG tablet Take 81 mg by mouth daily.   atorvastatin (LIPITOR) 80 MG tablet Take by mouth.   cloNIDine (CATAPRES) 0.1 MG tablet Take 0.1 mg by mouth at bedtime.   ferrous sulfate 325 (65 FE) MG tablet Take 325 mg by mouth daily.   furosemide (LASIX) 20 MG tablet Take 20 mg by mouth daily.    losartan (COZAAR) 50 MG tablet TAKE 1 TABLET BY MOUTH EVERY DAY   metoprolol tartrate (LOPRESSOR) 25 MG tablet Take 0.5 tablets (12.5 mg total) by mouth 2 (two) times daily.   nitroGLYCERIN (NITROSTAT) 0.4 MG SL tablet Place under the tongue.   [DISCONTINUED] ammonium lactate (LAC-HYDRIN) 12 % cream Apply to feet twice a dat (Patient not taking: Reported on 08/08/2019)   [DISCONTINUED] cephALEXin (KEFLEX) 500 MG capsule Take 1 capsule (500 mg total) by mouth 3 (three) times daily. (Patient not taking: Reported on 08/08/2019)   [DISCONTINUED] clopidogrel (PLAVIX) 75 MG tablet Take 1 tablet (75 mg total) by mouth daily. (Patient not taking: Reported on 11/06/2022)   [DISCONTINUED] cyclobenzaprine (FLEXERIL) 10 MG tablet Take 1 tablet (10 mg total) by mouth 2 (two) times daily as needed for muscle spasms. (Patient not taking: Reported on 11/06/2022)   [DISCONTINUED] ergocalciferol (VITAMIN D2) 1.25 MG (50000 UT) capsule Take 50,000 Units by mouth every Monday. (Patient not taking: Reported on 11/06/2022)   [DISCONTINUED] fexofenadine (ALLEGRA) 180 MG tablet Take 180 mg by mouth daily. (Patient not taking: Reported on 11/06/2022)   [DISCONTINUED] meloxicam (MOBIC) 15 MG tablet meloxicam 15 mg tablet  Take 1 tablet every day by oral route. (Patient not taking: Reported on 11/06/2022)   [DISCONTINUED] ondansetron (ZOFRAN ODT) 4 MG disintegrating tablet Take 1 tablet (4 mg total) by mouth every 8 (eight) hours as needed. (Patient not taking: Reported on 11/06/2022)   [DISCONTINUED] predniSONE  (DELTASONE) 20 MG tablet Take 2 tablets (40 mg total) by mouth daily. (Patient not taking: Reported on 11/06/2022)   [DISCONTINUED] ranitidine (ZANTAC) 150 MG tablet Take by mouth. (Patient not taking: Reported on 11/06/2022)   [DISCONTINUED] tolnaftate (TINACTIN) 1 % cream Apply 1 application topically 2 (two) times daily. (Patient not taking: Reported on 08/08/2019)   No facility-administered medications prior to visit.    Review of Systems  Musculoskeletal:  Positive for joint pain (right knee).  All other systems reviewed and are negative.       Objective:     BP 110/68   Pulse 61   Ht 5' 6.5" (1.689 m)   Wt 249 lb (112.9 kg)   LMP 05/19/2015 (LMP Unknown)   SpO2 96%   BMI 39.59 kg/m   Physical Exam Vitals and nursing note reviewed.  Constitutional:      Appearance: Normal appearance. She is normal weight.  HENT:     Head: Normocephalic.  Eyes:     Extraocular Movements: Extraocular movements intact.     Conjunctiva/sclera: Conjunctivae normal.     Pupils: Pupils are equal, round, and reactive to light.  Cardiovascular:     Rate and Rhythm: Normal rate and regular rhythm.     Pulses: Normal pulses.     Heart sounds: Normal heart sounds.  Pulmonary:     Effort: Pulmonary effort is normal.     Breath sounds: Normal breath sounds.  Musculoskeletal:     Cervical back: Normal range of motion.  Neurological:     General: No focal deficit present.     Mental Status: She is alert and oriented to person, place, and time. Mental status is at baseline.  Psychiatric:        Mood and Affect: Mood normal.        Behavior: Behavior normal.        Thought Content: Thought content normal.        Judgment: Judgment normal.      No results found for any visits on 01/09/23.  Recent Results (from the past 2160 hour(s))  Urinalysis, w/ Reflex to Culture (Infection Suspected) -Urine, Clean Catch     Status: Abnormal   Collection Time: 10/13/22 10:44 AM  Result Value Ref Range    Specimen Source URINE, CLEAN CATCH    Color, Urine YELLOW YELLOW   APPearance HAZY (A) CLEAR   Specific Gravity, Urine 1.025 1.005 - 1.030   pH 6.0 5.0 - 8.0   Glucose, UA NEGATIVE NEGATIVE mg/dL   Hgb urine dipstick NEGATIVE NEGATIVE   Bilirubin Urine SMALL (A) NEGATIVE   Ketones, ur 15 (A) NEGATIVE mg/dL   Protein, ur TRACE (A) NEGATIVE mg/dL   Nitrite NEGATIVE NEGATIVE   Leukocytes,Ua LARGE (A) NEGATIVE   Squamous Epithelial / HPF 11-20 0 - 5 /HPF   WBC, UA 21-50 0 - 5 WBC/hpf    Comment: Reflex urine culture not performed if WBC <=10, OR if Squamous epithelial cells >5. If Squamous epithelial cells >5, suggest recollection.   RBC / HPF 0-5 0 - 5 RBC/hpf   Bacteria, UA FEW (A) NONE SEEN   WBC Clumps PRESENT     Comment: Performed at [facility], [address] [place], [place] [address]  Lipid panel     Status: None   Collection Time: 11/22/22  9:51 AM  Result Value Ref Range   Cholesterol, Total 142 100 - 199 mg/dL   Triglycerides 51 0 - 149 mg/dL   HDL 57 >60 mg/dL   VLDL Cholesterol Cal 11 5 - 40 mg/dL   LDL Chol Calc (NIH) 74 0 - 99 mg/dL   Chol/HDL Ratio 2.5 0.0 - 4.4 ratio    Comment:                                   T. Chol/HDL Ratio                                             Men  Women                               1/2 Avg.Risk  3.4    3.3  Avg.Risk  5.0    4.4                                2X Avg.Risk  9.6    7.1                                3X Avg.Risk 23.4   11.0   VITAMIN D 25 Hydroxy (Vit-D Deficiency, Fractures)     Status: Abnormal   Collection Time: 11/22/22  9:51 AM  Result Value Ref Range   Vit D, 25-Hydroxy 29.2 (L) 30.0 - 100.0 ng/mL    Comment: Vitamin D deficiency has been defined by the Institute of Medicine and an Endocrine Society practice guideline as a level of serum 25-OH vitamin D less than 20 ng/mL (1,2). The Endocrine Society went on to further define vitamin D insufficiency as a level  between 21 and 29 ng/mL (2). 1. IOM (Institute of Medicine). 2010. Dietary reference    intakes for calcium and D. [place]: The    [employer]. 2. Holick MF, Binkley [place], Bischoff-Ferrari HA, et al.    Evaluation, treatment, and prevention of vitamin D    deficiency: an Endocrine Society clinical practice    guideline. JCEM. 2011 Jul; 96(7):1911-30.   CBC With Differential     Status: None   Collection Time: 11/22/22  9:51 AM  Result Value Ref Range   WBC 6.3 3.4 - 10.8 x10E3/uL   RBC 4.58 3.77 - 5.28 x10E6/uL   Hemoglobin 12.9 11.1 - 15.9 g/dL   Hematocrit 24.4 01.0 - 46.6 %   MCV 88 79 - 97 fL   MCH 28.2 26.6 - 33.0 pg   MCHC 31.9 31.5 - 35.7 g/dL   RDW 27.2 53.6 - 64.4 %   Neutrophils 61 Not Estab. %   Lymphs 29 Not Estab. %   Monocytes 7 Not Estab. %   Eos 1 Not Estab. %   Basos 1 Not Estab. %   Neutrophils Absolute 3.9 1.4 - 7.0 x10E3/uL   Lymphocytes Absolute 1.8 0.7 - 3.1 x10E3/uL   Monocytes Absolute 0.5 0.1 - 0.9 x10E3/uL   EOS (ABSOLUTE) 0.1 0.0 - 0.4 x10E3/uL   Basophils Absolute 0.0 0.0 - 0.2 x10E3/uL   Immature Granulocytes 1 Not Estab. %   Immature Grans (Abs) 0.1 0.0 - 0.1 x10E3/uL  CMP14+EGFR     Status: None   Collection Time: 11/22/22  9:51 AM  Result Value Ref Range   Glucose 91 70 - 99 mg/dL   BUN 18 6 - 24 mg/dL   Creatinine, Ser 0.34 0.57 - 1.00 mg/dL   eGFR 76 >74 QV/ZDG/3.87   BUN/Creatinine Ratio 20 9 - 23   Sodium 141 134 - 144 mmol/L   Potassium 4.4 3.5 - 5.2 mmol/L   Chloride 105 96 - 106 mmol/L   CO2 25 20 - 29 mmol/L   Calcium 9.1 8.7 - 10.2 mg/dL   Total Protein 6.5 6.0 - 8.5 g/dL   Albumin 4.0 3.8 - 4.9 g/dL   Globulin, Total 2.5 1.5 - 4.5 g/dL   Albumin/Globulin Ratio 1.6 1.2 - 2.2   Bilirubin Total 0.5 0.0 - 1.2 mg/dL   Alkaline Phosphatase 97 44 - 121 IU/L   AST 16 0 - 40 IU/L   ALT 10 0 - 32 IU/L  TSH     Status: None  Collection Time: 11/22/22  9:51 AM  Result Value Ref Range   TSH 1.180 0.450 - 4.500  uIU/mL  Hemoglobin A1c     Status: Abnormal   Collection Time: 11/22/22  9:51 AM  Result Value Ref Range   Hgb A1c MFr Bld 6.2 (H) 4.8 - 5.6 %    Comment:          Prediabetes: 5.7 - 6.4          Diabetes: >6.4          Glycemic control for adults with diabetes: <7.0    Est. average glucose Bld gHb Est-mCnc 131 mg/dL  Vitamin Z61     Status: None   Collection Time: 11/22/22  9:51 AM  Result Value Ref Range   Vitamin B-12 556 232 - 1,245 pg/mL        Assessment & Plan:    Routine Health Maintenance and Physical Exam   There is no immunization history on file for this patient.  Health Maintenance  Topic Date Due   COVID-19 Vaccine (1) Never done   Hepatitis C Screening  Never done   DTaP/Tdap/Td (1 - Tdap) Never done   Zoster Vaccines- Shingrix (1 of 2) Never done   Colonoscopy  Never done   PAP SMEAR-Modifier  02/25/2022   INFLUENZA VACCINE  02/01/2023   Medicare Annual Wellness (AWV)  05/09/2023   MAMMOGRAM  10/05/2024   HIV Screening  Completed   HPV VACCINES  Aged Out    Discussed health benefits of physical activity, and encouraged her to engage in regular exercise appropriate for her age and condition.  Problem List Items Addressed This Visit       Active Problems   Coronary artery disease involving native heart with angina pectoris [facility])    Patient is seen by Cardiology, who manage this condition.  She is well controlled with current therapy.   Will defer to them for further changes to plan of care.       Essential hypertension    Blood pressure well controlled with current medications.  Continue current therapy.  Will reassess at follow up.       Relevant Orders   CBC With Differential   CMP14+EGFR   Hyperlipidemia    Checking labs today.  Continue current therapy for lipid control. Will modify as needed based on labwork results.       Relevant Orders   Lipid panel   CBC With Differential   CMP14+EGFR   Severe obesity with body mass index  (BMI) of 35.0 to 39.9 with comorbidity (HCC)    Continue current meds.  Will adjust as needed based on results.  The patient is asked to make an attempt to improve diet and exercise patterns to aid in medical management of this problem. Addressed importance of increasing and maintaining water intake.        PAD (peripheral artery disease) (HCC)    Patient stable.  Well controlled with current therapy.   Continue current meds.       Vitamin D deficiency, unspecified    Checking labs today.  Will continue supplements as needed.       Relevant Orders   VITAMIN D 25 Hydroxy (Vit-D Deficiency, Fractures)   B12 deficiency due to diet    Checking labs today.  Will continue supplements as needed.       Relevant Orders   Vitamin B12   Prediabetes    .A1C Continues to be in prediabetic ranges.  Will reassess at follow up after next lab check.  Patient counseled on dietary choices and verbalized understanding.  Patient educated on foods that contain carbohydrates and the need to decrease intake.  We discussed prediabetes, and what it means and the need for strict dietary control to prevent progression to type 2 diabetes.  Advised to decrease intake of sugary drinks, including sodas, sweet tea, and some juices, and of starch and sugar heavy foods (ie., potatoes, rice, bread, pasta, desserts). She verbalizes understanding and agreement with the changes discussed today.        Relevant Orders   Hemoglobin A1c   Other Visit Diagnoses     Routine general medical examination at a health care facility    -  Primary   CPE completed today Pap smear sent. - will call pt with results.   Relevant Orders   CBC With Differential   CMP14+EGFR   Cervical cancer screening       Relevant Orders   IGP,CtNgTv,Apt HPV,rfx16/18,45   CBC With Differential   CMP14+EGFR   Contact with and (suspected) exposure to infections with a predominantly sexual mode of transmission       Relevant Orders    IGP,CtNgTv,Apt HPV,rfx16/18,45   CBC With Differential   CMP14+EGFR   Other fatigue       Relevant Orders   TSH      Return in about 3 months (around 04/11/2023) for F/U.     Miki Kins, FNP  01/09/2023   This document may have been prepared by [facility] Voice Recognition software and as such may include unintentional dictation errors.

## 2023-01-09 NOTE — Assessment & Plan Note (Signed)
A1C Continues to be in prediabetic ranges.  Will reassess at follow up after next lab check.  Patient counseled on dietary choices and verbalized understanding.  Patient educated on foods that contain carbohydrates and the need to decrease intake.  We discussed prediabetes, and what it means and the need for strict dietary control to prevent progression to type 2 diabetes.  Advised to decrease intake of sugary drinks, including sodas, sweet tea, and some juices, and of starch and sugar heavy foods (ie., potatoes, rice, bread, pasta, desserts). She verbalizes understanding and agreement with the changes discussed today.  

## 2023-01-09 NOTE — Assessment & Plan Note (Signed)
Blood pressure well controlled with current medications.  Continue current therapy.  Will reassess at follow up.  

## 2023-01-09 NOTE — Assessment & Plan Note (Signed)
Patient is seen by Cardiology, who manage this condition.  She is well controlled with current therapy.   Will defer to them for further changes to plan of care.  

## 2023-01-09 NOTE — Assessment & Plan Note (Signed)
Continue current meds.  Will adjust as needed based on results.  The patient is asked to make an attempt to improve diet and exercise patterns to aid in medical management of this problem. Addressed importance of increasing and maintaining water intake.   

## 2023-01-10 LAB — CMP14+EGFR
ALT: 15 IU/L (ref 0–32)
AST: 21 IU/L (ref 0–40)
Albumin: 4 g/dL (ref 3.8–4.9)
Alkaline Phosphatase: 104 IU/L (ref 44–121)
BUN/Creatinine Ratio: 22 (ref 9–23)
BUN: 22 mg/dL (ref 6–24)
Bilirubin Total: 0.5 mg/dL (ref 0.0–1.2)
CO2: 22 mmol/L (ref 20–29)
Calcium: 9.5 mg/dL (ref 8.7–10.2)
Chloride: 103 mmol/L (ref 96–106)
Creatinine, Ser: 1 mg/dL (ref 0.57–1.00)
Globulin, Total: 2.9 g/dL (ref 1.5–4.5)
Glucose: 90 mg/dL (ref 70–99)
Potassium: 4.3 mmol/L (ref 3.5–5.2)
Sodium: 142 mmol/L (ref 134–144)
Total Protein: 6.9 g/dL (ref 6.0–8.5)
eGFR: 65 mL/min/{1.73_m2} (ref >59)

## 2023-01-10 LAB — CBC WITH DIFFERENTIAL
Basophils Absolute: 0 10*3/uL (ref 0.0–0.2)
Basos: 0 %
EOS (ABSOLUTE): 0.1 10*3/uL (ref 0.0–0.4)
Eos: 2 %
Hematocrit: 40.4 % (ref 34.0–46.6)
Hemoglobin: 12.7 g/dL (ref 11.1–15.9)
Immature Grans (Abs): 0 10*3/uL (ref 0.0–0.1)
Immature Granulocytes: 0 %
Lymphocytes Absolute: 1.6 10*3/uL (ref 0.7–3.1)
Lymphs: 24 %
MCH: 28.1 pg (ref 26.6–33.0)
MCHC: 31.4 g/dL — ABNORMAL LOW (ref 31.5–35.7)
MCV: 89 fL (ref 79–97)
Monocytes Absolute: 0.5 10*3/uL (ref 0.1–0.9)
Monocytes: 7 %
Neutrophils Absolute: 4.6 10*3/uL (ref 1.4–7.0)
Neutrophils: 67 %
RBC: 4.52 x10E6/uL (ref 3.77–5.28)
RDW: 13.9 % (ref 11.7–15.4)
WBC: 6.9 10*3/uL (ref 3.4–10.8)

## 2023-01-10 LAB — VITAMIN D 25 HYDROXY (VIT D DEFICIENCY, FRACTURES): Vit D, 25-Hydroxy: 33.2 ng/mL (ref 30.0–100.0)

## 2023-01-10 LAB — LIPID PANEL
Chol/HDL Ratio: 2.4 ratio (ref 0.0–4.4)
Cholesterol, Total: 161 mg/dL (ref 100–199)
HDL: 68 mg/dL (ref >39)
LDL Chol Calc (NIH): 80 mg/dL (ref 0–99)
Triglycerides: 66 mg/dL (ref 0–149)
VLDL Cholesterol Cal: 13 mg/dL (ref 5–40)

## 2023-01-10 LAB — VITAMIN B12: Vitamin B-12: 515 pg/mL (ref 232–1245)

## 2023-01-10 LAB — TSH: TSH: 2.06 u[IU]/mL (ref 0.450–4.500)

## 2023-01-10 LAB — HEMOGLOBIN A1C
Est. average glucose Bld gHb Est-mCnc: 128 mg/dL
Hgb A1c MFr Bld: 6.1 % — ABNORMAL HIGH (ref 4.8–5.6)

## 2023-01-12 LAB — IGP,CTNGTV,APT HPV,RFX16/18,45
Chlamydia, Nuc. Acid Amp: NEGATIVE
Gonococcus, Nuc. Acid Amp: NEGATIVE
HPV Aptima: NEGATIVE
PAP Smear Comment: 0
Trich vag by NAA: NEGATIVE

## 2023-01-12 LAB — SPECIMEN STATUS REPORT

## 2023-01-15 ENCOUNTER — Telehealth: Payer: Self-pay | Admitting: Family

## 2023-01-15 NOTE — Telephone Encounter (Signed)
Patient left VM inquiring about her lab results. Please advise.

## 2023-01-15 NOTE — Telephone Encounter (Signed)
Patient called again regarding her test results. Please advise.

## 2023-01-16 NOTE — Telephone Encounter (Signed)
Patient left VM wanting her lab results and mentioned something about her leg. Could not understand what she was saying. We need to call back and see what she is talking about her legs.

## 2023-01-22 ENCOUNTER — Telehealth: Payer: Self-pay

## 2023-01-22 NOTE — Telephone Encounter (Signed)
Pt LM asking for call back.

## 2023-01-22 NOTE — Telephone Encounter (Signed)
Patient called again wanting leg x-rays done and wanting her lab results from 7/9. Please advise.

## 2023-01-23 ENCOUNTER — Other Ambulatory Visit: Payer: Self-pay | Admitting: Family

## 2023-01-23 ENCOUNTER — Emergency Department
Admission: EM | Admit: 2023-01-23 | Discharge: 2023-01-23 | Disposition: A | Discharge status: Home / Self Care | Payer: Medicare HMO | Attending: Emergency Medicine | Admitting: Emergency Medicine

## 2023-01-23 ENCOUNTER — Other Ambulatory Visit: Payer: Self-pay

## 2023-01-23 DIAGNOSIS — R600 Localized edema: Secondary | ICD-10-CM | POA: Insufficient documentation

## 2023-01-23 DIAGNOSIS — R2243 Localized swelling, mass and lump, lower limb, bilateral: Secondary | ICD-10-CM | POA: Diagnosis present

## 2023-01-23 DIAGNOSIS — E119 Type 2 diabetes mellitus without complications: Secondary | ICD-10-CM | POA: Insufficient documentation

## 2023-01-23 DIAGNOSIS — R001 Bradycardia, unspecified: Secondary | ICD-10-CM | POA: Diagnosis not present

## 2023-01-23 DIAGNOSIS — I2510 Atherosclerotic heart disease of native coronary artery without angina pectoris: Secondary | ICD-10-CM | POA: Insufficient documentation

## 2023-01-23 DIAGNOSIS — M25571 Pain in right ankle and joints of right foot: Secondary | ICD-10-CM

## 2023-01-23 LAB — TROPONIN I (HIGH SENSITIVITY): Troponin I (High Sensitivity): 4 ng/L (ref <18)

## 2023-01-23 LAB — CBC
HCT: 37.5 % (ref 36.0–46.0)
Hemoglobin: 11.7 g/dL — ABNORMAL LOW (ref 12.0–15.0)
MCH: 28.2 pg (ref 26.0–34.0)
MCHC: 31.2 g/dL (ref 30.0–36.0)
MCV: 90.4 fL (ref 80.0–100.0)
Platelets: 254 10*3/uL (ref 150–400)
RBC: 4.15 MIL/uL (ref 3.87–5.11)
RDW: 14.8 % (ref 11.5–15.5)
WBC: 7.5 10*3/uL (ref 4.0–10.5)
nRBC: 0.3 % — ABNORMAL HIGH (ref 0.0–0.2)

## 2023-01-23 LAB — COMPREHENSIVE METABOLIC PANEL
ALT: 14 U/L (ref 0–44)
AST: 18 U/L (ref 15–41)
Albumin: 3.8 g/dL (ref 3.5–5.0)
Alkaline Phosphatase: 82 U/L (ref 38–126)
Anion gap: 7 (ref 5–15)
BUN: 21 mg/dL — ABNORMAL HIGH (ref 6–20)
CO2: 26 mmol/L (ref 22–32)
Calcium: 8.5 mg/dL — ABNORMAL LOW (ref 8.9–10.3)
Chloride: 104 mmol/L (ref 98–111)
Creatinine, Ser: 0.91 mg/dL (ref 0.44–1.00)
GFR, Estimated: >60 mL/min (ref >60)
Glucose, Bld: 85 mg/dL (ref 70–99)
Potassium: 4 mmol/L (ref 3.5–5.1)
Sodium: 137 mmol/L (ref 135–145)
Total Bilirubin: 0.8 mg/dL (ref 0.3–1.2)
Total Protein: 7.6 g/dL (ref 6.5–8.1)

## 2023-01-23 LAB — BRAIN NATRIURETIC PEPTIDE: B Natriuretic Peptide: 89.6 pg/mL (ref 0.0–100.0)

## 2023-01-23 MED ORDER — FUROSEMIDE 20 MG PO TABS: 40.0000 mg | ORAL_TABLET | Freq: Every day | ORAL | 0 refills | Status: DC

## 2023-01-23 NOTE — ED Triage Notes (Signed)
Pt here with bilateral leg swelling x1 week. Pt states they hurt when she walks as well. Pt denies any cp or SOB.

## 2023-01-23 NOTE — Discharge Instructions (Addendum)
Please take 40mg  Lasix by mouth daily

## 2023-01-23 NOTE — Telephone Encounter (Signed)
Spoke with Marchelle Folks and labs look good. She has placed orders for the leg x-rays, [facility] and [facility] done at her convenience.

## 2023-01-23 NOTE — ED Notes (Signed)
This RN called lab about getting an add-on Troponin.

## 2023-01-23 NOTE — ED Provider Notes (Signed)
[facility] Provider Note   Event Date/Time   First MD Initiated Contact with Patient 01/23/23 1135     (approximate) History  Leg Swelling  HPI Sarah Medina is a 57 y.o. female with a stated past medical history of type 2 diabetes and CAD who presents complaining of 2 weeks of worsening bilateral lower extremity swelling.  Patient is using compression stockings however states that if she does not put them on immediately her legs grow twice the size.  Patient is having increasing difficulty with ambulation due to the size of her feet.  Patient denies any dyspnea on exertion or orthopnea associated with the swelling. ROS: Patient currently denies any vision changes, tinnitus, difficulty speaking, facial droop, sore throat, chest pain, shortness of breath, abdominal pain, nausea/vomiting/diarrhea, dysuria, or weakness/numbness/paresthesias in any extremity   Physical Exam  Triage Vital Signs: ED Triage Vitals [01/23/23 1125]  Encounter Vitals Group     BP (!) 156/72     Systolic BP Percentile      Diastolic BP Percentile      Pulse Rate (!) 57     Resp 18     Temp 97.9 F (36.6 C)     Temp Source Oral     SpO2 100 %     Weight 248 lb 14.4 oz (112.9 kg)     Height 5' 6.5" (1.689 m)     Head Circumference      Peak Flow      Pain Score 0     Pain Loc      Pain Education      Exclude from Growth Chart    Most recent vital signs: Vitals:   01/23/23 1142 01/23/23 1330  BP: (!) 150/65 (!) 159/74  Pulse: (!) 48 (!) 49  Resp: 16 16  Temp:    SpO2: 100% 100%   General: Awake, oriented x4. CV:  Good peripheral perfusion.  Resp:  Normal effort.  Abd:  No distention.  Other:  Obese middle-aged African-American female laying in bed in no acute distress.  2+ pitting edema to left lower extremity and compression stocking on right lower extremity ED Results / Procedures / Treatments  Labs (all labs ordered are listed, but only abnormal results are  displayed) Labs Reviewed  CBC - Abnormal; Notable for the following components:      Result Value   Hemoglobin 11.7 (*)    nRBC 0.3 (*)    All other components within normal limits  COMPREHENSIVE METABOLIC PANEL - Abnormal; Notable for the following components:   BUN 21 (*)    Calcium 8.5 (*)    All other components within normal limits  BRAIN NATRIURETIC PEPTIDE  TROPONIN I (HIGH SENSITIVITY)  TROPONIN I (HIGH SENSITIVITY)   EKG ED ECG REPORT I, Merwyn Katos, the attending physician, personally viewed and interpreted this ECG. Date: 01/23/2023 EKG Time: 1129 Rate: 46 Rhythm: Bradycardic sinus rhythm QRS Axis: normal Intervals: normal ST/T Wave abnormalities: normal Narrative Interpretation: Bradycardic sinus rhythm.  No evidence of acute ischemia PROCEDURES: Critical Care performed: No .1-3 Lead EKG Interpretation  Performed by: Merwyn Katos, MD Authorized by: Merwyn Katos, MD     Interpretation: normal     ECG rate:  71   ECG rate assessment: normal     Rhythm: sinus rhythm     Ectopy: none     Conduction: normal    MEDICATIONS ORDERED IN ED: Medications - No data to display IMPRESSION /  MDM / ASSESSMENT AND PLAN / ED COURSE  I reviewed the triage vital signs and the nursing notes.                             The patient is on the cardiac monitor to evaluate for evidence of arrhythmia and/or significant heart rate changes. Patient's presentation is most consistent with acute presentation with potential threat to life or bodily function. Denies dyspnea, endorses LE edema Denies Non adherence to medication regimen  Workup: ECG, CBC, BMP, Troponin, BNP, CXR Findings: EKG: No STEMI and no evidence of Brugadas sign, delta wave, epsilon wave, significantly prolonged QTc, or malignant arrhythmia. BNP: 89.6 Based on history, exam and findings, presentation most consistent with acute on chronic heart failure. Low suspicion for PNA, ACS, tamponade, aortic  dissection. Interventions: None  Disposition (Stable): Discharge    FINAL CLINICAL IMPRESSION(S) / ED DIAGNOSES   Final diagnoses:  Bilateral lower extremity edema   Rx / DC Orders   ED Discharge Orders          Ordered    furosemide (LASIX) 20 MG tablet  Daily        01/23/23 1429           Note:  This document was prepared using Dragon voice recognition software and may include unintentional dictation errors.   Merwyn Katos, MD 01/23/23 [phone]

## 2023-01-24 ENCOUNTER — Other Ambulatory Visit: Payer: Self-pay | Admitting: Family

## 2023-01-29 ENCOUNTER — Telehealth: Payer: Self-pay

## 2023-01-29 NOTE — Telephone Encounter (Signed)
Transition Care Management Follow-up Telephone Call Date of discharge and from where: 01/23/2023 [facility] How have you been since you were released from the hospital? Patient stated she is feeling better. Any questions or concerns? No  Items Reviewed: Did the pt receive and understand the discharge instructions provided? Yes  Medications obtained and verified? Yes  Other? No  Any new allergies since your discharge? No  Dietary orders reviewed? Yes Do you have support at home? Yes   Follow up appointments reviewed:  PCP Hospital f/u appt confirmed? Yes  Scheduled to see Maryanna Shape. Talbert Forest, FNP on 02/05/2023 @ [facility] Specialist Hospital f/u appt confirmed? No  Scheduled to see  on  @ . Are transportation arrangements needed? No  If their condition worsens, is the pt aware to call PCP or go to the Emergency Dept.? Yes Was the patient provided with contact information for the PCP's office or ED? Yes Was to pt encouraged to call back with questions or concerns? Yes   Lam Mccubbins [facility]  [facility] Population [facility] Community Resource Care Guide   [email or web address]  ?? [phone]   [email or web address]: [email or web address]  [email or web address]

## 2023-01-31 ENCOUNTER — Ambulatory Visit
Admission: RE | Admit: 2023-01-31 | Discharge: 2023-01-31 | Disposition: A | Discharge status: Home / Self Care | Payer: Medicare HMO | Attending: Family | Admitting: Family

## 2023-01-31 ENCOUNTER — Ambulatory Visit
Admission: RE | Admit: 2023-01-31 | Discharge: 2023-01-31 | Disposition: A | Discharge status: Home / Self Care | Payer: Medicare HMO | Source: Ambulatory Visit | Attending: Family | Admitting: Family

## 2023-01-31 DIAGNOSIS — M25571 Pain in right ankle and joints of right foot: Secondary | ICD-10-CM | POA: Insufficient documentation

## 2023-01-31 DIAGNOSIS — M19071 Primary osteoarthritis, right ankle and foot: Secondary | ICD-10-CM | POA: Diagnosis not present

## 2023-01-31 DIAGNOSIS — M7989 Other specified soft tissue disorders: Secondary | ICD-10-CM | POA: Diagnosis not present

## 2023-02-05 ENCOUNTER — Ambulatory Visit: Payer: Medicare HMO | Admitting: Family

## 2023-02-05 ENCOUNTER — Other Ambulatory Visit
Admission: RE | Admit: 2023-02-05 | Discharge: 2023-02-05 | Disposition: A | Discharge status: Home / Self Care | Payer: Medicare HMO | Attending: Family | Admitting: Family

## 2023-02-05 VITALS — BP 115/70 | HR 61 | Ht 66.5 in | Wt 259.2 lb

## 2023-02-05 DIAGNOSIS — I739 Peripheral vascular disease, unspecified: Secondary | ICD-10-CM

## 2023-02-05 DIAGNOSIS — R799 Abnormal finding of blood chemistry, unspecified: Secondary | ICD-10-CM

## 2023-02-05 DIAGNOSIS — R5383 Other fatigue: Secondary | ICD-10-CM

## 2023-02-05 LAB — IRON AND TIBC
Iron: 59 ug/dL (ref 28–170)
Saturation Ratios: 20 % (ref 10.4–31.8)
TIBC: 290 ug/dL (ref 250–450)
UIBC: 231 ug/dL

## 2023-02-05 LAB — COMPREHENSIVE METABOLIC PANEL
ALT: 17 U/L (ref 0–44)
AST: 19 U/L (ref 15–41)
Albumin: 3.6 g/dL (ref 3.5–5.0)
Alkaline Phosphatase: 86 U/L (ref 38–126)
Anion gap: 8 (ref 5–15)
BUN: 16 mg/dL (ref 6–20)
CO2: 27 mmol/L (ref 22–32)
Calcium: 8.9 mg/dL (ref 8.9–10.3)
Chloride: 106 mmol/L (ref 98–111)
Creatinine, Ser: 0.89 mg/dL (ref 0.44–1.00)
GFR, Estimated: >60 mL/min (ref >60)
Glucose, Bld: 70 mg/dL (ref 70–99)
Potassium: 4.6 mmol/L (ref 3.5–5.1)
Sodium: 141 mmol/L (ref 135–145)
Total Bilirubin: 0.9 mg/dL (ref 0.3–1.2)
Total Protein: 7.2 g/dL (ref 6.5–8.1)

## 2023-02-05 NOTE — Progress Notes (Signed)
Established Patient Office Visit  Subjective:  Patient ID: Sarah Medina, female    DOB: 12-Apr-1966  Age: 57 y.o. MRN: [id number]  Chief Complaint  Patient presents with   Acute Visit    Leg problem    Patient here for hospital follow up.  She went to the ED for severe swelling in her legs.   This has improved, but she has been taking a fluid pill.   Needs referral to get a colonoscopy.   No other concerns at this time.   Past Medical History:  Diagnosis Date   Arthritis    hips, right leg   CAD in native artery, RCA, LAD and LCX 07/23/2015   Carotid artery occlusion    Cellulitis of left lower extremity    Chest pain, rule out acute myocardial infarction 07/11/2016   H/O myocardial infarction less than 8 weeks- STEMI inf wall 07/20/15- stent to RCA 07/20/2015   inf wall STEMI with DES to RCA   Hypercholesteremia    Hypertension    Periorbital edema    treated with steroids   Positive D dimer    Seizures (HCC)    as teenager, ages [age].  none since   Sepsis (HCC) 12/25/2018   ST elevation myocardial infarction (STEMI) of inferior wall (HCC) 07/20/2015   STEMI (ST elevation myocardial infarction) (HCC) 07/23/2015   UTI (urinary tract infection)    Ventricular fibrillation (HCC) 07/23/2015    Past Surgical History:  Procedure Laterality Date   CARDIAC CATHETERIZATION N/A 07/20/2015   Procedure: Left Heart Cath and Coronary Angiography;  Surgeon: Marcina Millard, MD;  Location: [facility] INVASIVE CV LAB;  Service: Cardiovascular;  Laterality: N/A;   CARDIAC CATHETERIZATION N/A 07/20/2015   Procedure: Coronary Stent Intervention;  Surgeon: Marcina Millard, MD;  Location: [facility] INVASIVE CV LAB;  Service: Cardiovascular;  Laterality: N/A;   CARDIAC CATHETERIZATION N/A 07/23/2015   Procedure: Left Heart Cath and Coronary Angiography;  Surgeon: Runell Gess, MD;  Location: [facility] INVASIVE CV LAB;  Service: Cardiovascular;  Laterality: N/A;   CARDIAC CATHETERIZATION N/A  07/23/2015   Procedure: Coronary Balloon Angioplasty;  Surgeon: Runell Gess, MD;  Location: [facility] INVASIVE CV LAB;  Service: Cardiovascular;  Laterality: N/A;   CARDIAC CATHETERIZATION N/A 07/12/2016   Procedure: Left Heart Cath;  Surgeon: Dalia Heading, MD;  Location: [facility] INVASIVE CV LAB;  Service: Cardiovascular;  Laterality: N/A;   CARDIAC CATHETERIZATION N/A 07/12/2016   Procedure: Left Heart Cath and Coronary Angiography;  Surgeon: Marcina Millard, MD;  Location: [facility] INVASIVE CV LAB;  Service: Cardiovascular;  Laterality: N/A;   CARDIAC CATHETERIZATION N/A 07/12/2016   Procedure: Intravascular Pressure Wire/FFR Study;  Surgeon: Marcina Millard, MD;  Location: [facility] INVASIVE CV LAB;  Service: Cardiovascular;  Laterality: N/A;   CARDIAC CATHETERIZATION N/A 07/12/2016   Procedure: Coronary Stent Intervention;  Surgeon: Marcina Millard, MD;  Location: [facility] INVASIVE CV LAB;  Service: Cardiovascular;  Laterality: N/A;   KNEE SURGERY Right 15 years ago    Social History   Socioeconomic History   Marital status: Married    Spouse name: Not on file   Number of children: Not on file   Years of education: Not on file   Highest education level: Not on file  Occupational History   Not on file  Tobacco Use   Smoking status: Never   Smokeless tobacco: Former    Types: Snuff    Quit date: 2014  Vaping Use   Vaping status: Never Used  Substance and  Sexual Activity   Alcohol use: No   Drug use: Never   Sexual activity: Yes    Birth control/protection: None  Other Topics Concern   Not on file  Social History Narrative   Not on file   Social Determinants of Health   Financial Resource Strain: Not on file  Food Insecurity: Not on file  Transportation Needs: Not on file  Physical Activity: Not on file  Stress: Not on file  Social Connections: Not on file  Intimate Partner Violence: Not on file    Family History  Problem Relation Age of Onset   Breast cancer Sister 55     Allergies  Allergen Reactions   Enalapril Swelling    Swelling face    ROS     Objective:   BP 115/70   Pulse 61   Ht 5' 6.5" (1.689 m)   Wt 259 lb 3.2 oz (117.6 kg)   LMP 05/19/2015 (LMP Unknown)   SpO2 98%   BMI 41.21 kg/m   Vitals:   02/05/23 0934  BP: 115/70  Pulse: 61  Height: 5' 6.5" (1.689 m)  Weight: 259 lb 3.2 oz (117.6 kg)  SpO2: 98%  BMI (Calculated): 41.21    Physical Exam   No results found for any visits on 02/05/23.  Recent Results (from the past 2160 hour(s))  Lipid panel     Status: None   Collection Time: 11/22/22  9:51 AM  Result Value Ref Range   Cholesterol, Total 142 100 - 199 mg/dL   Triglycerides 51 0 - 149 mg/dL   HDL 57 >16 mg/dL   VLDL Cholesterol Cal 11 5 - 40 mg/dL   LDL Chol Calc (NIH) 74 0 - 99 mg/dL   Chol/HDL Ratio 2.5 0.0 - 4.4 ratio    Comment:                                   T. Chol/HDL Ratio                                             Men  Women                               1/2 Avg.Risk  3.4    3.3                                   Avg.Risk  5.0    4.4                                2X Avg.Risk  9.6    7.1                                3X Avg.Risk 23.4   11.0   VITAMIN D 25 Hydroxy (Vit-D Deficiency, Fractures)     Status: Abnormal   Collection Time: 11/22/22  9:51 AM  Result Value Ref Range   Vit D, 25-Hydroxy 29.2 (L) 30.0 - 100.0 ng/mL    Comment: Vitamin D deficiency has been defined by the Institute  of Medicine and an Endocrine Society practice guideline as a level of serum 25-OH vitamin D less than 20 ng/mL (1,2). The Endocrine Society went on to further define vitamin D insufficiency as a level between 21 and 29 ng/mL (2). 1. IOM (Institute of Medicine). 2010. Dietary reference    intakes for calcium and D. [place]: The    [employer]. 2. Holick MF, Binkley [place], Bischoff-Ferrari HA, et al.    Evaluation, treatment, and prevention of vitamin D    deficiency: an Endocrine Society  clinical practice    guideline. JCEM. 2011 Jul; 96(7):1911-30.   CBC With Differential     Status: None   Collection Time: 11/22/22  9:51 AM  Result Value Ref Range   WBC 6.3 3.4 - 10.8 x10E3/uL   RBC 4.58 3.77 - 5.28 x10E6/uL   Hemoglobin 12.9 11.1 - 15.9 g/dL   Hematocrit 40.9 81.1 - 46.6 %   MCV 88 79 - 97 fL   MCH 28.2 26.6 - 33.0 pg   MCHC 31.9 31.5 - 35.7 g/dL   RDW 91.4 78.2 - 95.6 %   Neutrophils 61 Not Estab. %   Lymphs 29 Not Estab. %   Monocytes 7 Not Estab. %   Eos 1 Not Estab. %   Basos 1 Not Estab. %   Neutrophils Absolute 3.9 1.4 - 7.0 x10E3/uL   Lymphocytes Absolute 1.8 0.7 - 3.1 x10E3/uL   Monocytes Absolute 0.5 0.1 - 0.9 x10E3/uL   EOS (ABSOLUTE) 0.1 0.0 - 0.4 x10E3/uL   Basophils Absolute 0.0 0.0 - 0.2 x10E3/uL   Immature Granulocytes 1 Not Estab. %   Immature Grans (Abs) 0.1 0.0 - 0.1 x10E3/uL  CMP14+EGFR     Status: None   Collection Time: 11/22/22  9:51 AM  Result Value Ref Range   Glucose 91 70 - 99 mg/dL   BUN 18 6 - 24 mg/dL   Creatinine, Ser 2.13 0.57 - 1.00 mg/dL   eGFR 76 >08 MV/HQI/6.96   BUN/Creatinine Ratio 20 9 - 23   Sodium 141 134 - 144 mmol/L   Potassium 4.4 3.5 - 5.2 mmol/L   Chloride 105 96 - 106 mmol/L   CO2 25 20 - 29 mmol/L   Calcium 9.1 8.7 - 10.2 mg/dL   Total Protein 6.5 6.0 - 8.5 g/dL   Albumin 4.0 3.8 - 4.9 g/dL   Globulin, Total 2.5 1.5 - 4.5 g/dL   Albumin/Globulin Ratio 1.6 1.2 - 2.2   Bilirubin Total 0.5 0.0 - 1.2 mg/dL   Alkaline Phosphatase 97 44 - 121 IU/L   AST 16 0 - 40 IU/L   ALT 10 0 - 32 IU/L  TSH     Status: None   Collection Time: 11/22/22  9:51 AM  Result Value Ref Range   TSH 1.180 0.450 - 4.500 uIU/mL  Hemoglobin A1c     Status: Abnormal   Collection Time: 11/22/22  9:51 AM  Result Value Ref Range   Hgb A1c MFr Bld 6.2 (H) 4.8 - 5.6 %    Comment:          Prediabetes: 5.7 - 6.4          Diabetes: >6.4          Glycemic control for adults with diabetes: <7.0    Est. average glucose Bld gHb Est-mCnc  131 mg/dL  Vitamin E95     Status: None   Collection Time: 11/22/22  9:51 AM  Result Value Ref Range  Vitamin B-12 556 232 - 1,245 pg/mL  IGP,CtNgTv,Apt HPV,rfx16/18,45     Status: None   Collection Time: 01/09/23 12:00 AM  Result Value Ref Range   DIAGNOSIS: Comment     Comment: NEGATIVE FOR INTRAEPITHELIAL LESION OR MALIGNANCY. CELLULAR CHANGES ASSOCIATED WITH INFLAMMATION ARE PRESENT.    Specimen adequacy: Comment     Comment: Satisfactory for evaluation. Endocervical and/or squamous metaplastic cells (endocervical component) are present.    Clinician Provided ICD10 Comment     Comment: Z12.4 Z20.2    Performed by: Comment     Comment: Lynann Beaver, Supervisory Cytotechnologist (ASCP)   PAP Smear Comment .    Note: Comment     Comment: The Pap smear is a screening test designed to aid in the detection of premalignant and malignant conditions of the uterine cervix.  It is not a diagnostic procedure and should not be used as the sole means of detecting cervical cancer.  Both false-positive and false-negative reports do occur.    Test Methodology Comment     Comment: This liquid based ThinPrep(R) pap test was screened with the use of an image guided system.    HPV Aptima Negative Negative    Comment: This nucleic acid amplification test detects fourteen high-risk HPV types (16,18,31,33,35,39,45,51,52,56,58,59,66,68) without differentiation.    HPV Genotype Reflex Comment     Comment: Criteria not met, HPV Genotype not performed.   Chlamydia, Nuc. Acid Amp Negative Negative   Gonococcus, Nuc. Acid Amp Negative Negative   Trich vag by NAA Negative Negative  Specimen status report     Status: None   Collection Time: 01/09/23 12:00 AM  Result Value Ref Range   specimen status report Comment     Comment: Please note Please note The date and/or time of collection was not indicated on the requisition as required by state and federal law.  The date of receipt of the specimen  was used as the collection date if not supplied.   Lipid panel     Status: None   Collection Time: 01/09/23 11:03 AM  Result Value Ref Range   Cholesterol, Total 161 100 - 199 mg/dL   Triglycerides 66 0 - 149 mg/dL   HDL 68 >86 mg/dL   VLDL Cholesterol Cal 13 5 - 40 mg/dL   LDL Chol Calc (NIH) 80 0 - 99 mg/dL   Chol/HDL Ratio 2.4 0.0 - 4.4 ratio    Comment:                                   T. Chol/HDL Ratio                                             Men  Women                               1/2 Avg.Risk  3.4    3.3                                   Avg.Risk  5.0    4.4  2X Avg.Risk  9.6    7.1                                3X Avg.Risk 23.4   11.0   VITAMIN D 25 Hydroxy (Vit-D Deficiency, Fractures)     Status: None   Collection Time: 01/09/23 11:03 AM  Result Value Ref Range   Vit D, 25-Hydroxy 33.2 30.0 - 100.0 ng/mL    Comment: Vitamin D deficiency has been defined by the Institute of Medicine and an Endocrine Society practice guideline as a level of serum 25-OH vitamin D less than 20 ng/mL (1,2). The Endocrine Society went on to further define vitamin D insufficiency as a level between 21 and 29 ng/mL (2). 1. IOM (Institute of Medicine). 2010. Dietary reference    intakes for calcium and D. [place]: The    [employer]. 2. Holick MF, Binkley [place], Bischoff-Ferrari HA, et al.    Evaluation, treatment, and prevention of vitamin D    deficiency: an Endocrine Society clinical practice    guideline. JCEM. 2011 Jul; 96(7):1911-30.   CBC With Differential     Status: Abnormal   Collection Time: 01/09/23 11:03 AM  Result Value Ref Range   WBC 6.9 3.4 - 10.8 x10E3/uL   RBC 4.52 3.77 - 5.28 x10E6/uL   Hemoglobin 12.7 11.1 - 15.9 g/dL   Hematocrit 16.1 09.6 - 46.6 %   MCV 89 79 - 97 fL   MCH 28.1 26.6 - 33.0 pg   MCHC 31.4 (L) 31.5 - 35.7 g/dL   RDW 04.5 40.9 - 81.1 %   Neutrophils 67 Not Estab. %   Lymphs 24 Not Estab. %    Monocytes 7 Not Estab. %   Eos 2 Not Estab. %   Basos 0 Not Estab. %   Neutrophils Absolute 4.6 1.4 - 7.0 x10E3/uL   Lymphocytes Absolute 1.6 0.7 - 3.1 x10E3/uL   Monocytes Absolute 0.5 0.1 - 0.9 x10E3/uL   EOS (ABSOLUTE) 0.1 0.0 - 0.4 x10E3/uL   Basophils Absolute 0.0 0.0 - 0.2 x10E3/uL   Immature Granulocytes 0 Not Estab. %   Immature Grans (Abs) 0.0 0.0 - 0.1 x10E3/uL    Comment: **Effective January 29, 2023, profile [id number] CBC/Differential**   (No Platelet) will be made non-orderable. [facility] Offers:   [id number] CBC With Differential/Platelet   CMP14+EGFR     Status: None   Collection Time: 01/09/23 11:03 AM  Result Value Ref Range   Glucose 90 70 - 99 mg/dL   BUN 22 6 - 24 mg/dL   Creatinine, Ser 9.56 0.57 - 1.00 mg/dL   eGFR 65 >21 HY/QMV/7.84   BUN/Creatinine Ratio 22 9 - 23   Sodium 142 134 - 144 mmol/L   Potassium 4.3 3.5 - 5.2 mmol/L   Chloride 103 96 - 106 mmol/L   CO2 22 20 - 29 mmol/L   Calcium 9.5 8.7 - 10.2 mg/dL   Total Protein 6.9 6.0 - 8.5 g/dL   Albumin 4.0 3.8 - 4.9 g/dL   Globulin, Total 2.9 1.5 - 4.5 g/dL   Bilirubin Total 0.5 0.0 - 1.2 mg/dL   Alkaline Phosphatase 104 44 - 121 IU/L   AST 21 0 - 40 IU/L   ALT 15 0 - 32 IU/L  TSH     Status: None   Collection Time: 01/09/23 11:03 AM  Result Value Ref Range   TSH 2.060 0.450 - 4.500 uIU/mL  Hemoglobin A1c     Status: Abnormal   Collection Time: 01/09/23 11:03 AM  Result Value Ref Range   Hgb A1c MFr Bld 6.1 (H) 4.8 - 5.6 %    Comment:          Prediabetes: 5.7 - 6.4          Diabetes: >6.4          Glycemic control for adults with diabetes: <7.0    Est. average glucose Bld gHb Est-mCnc 128 mg/dL  Vitamin Z61     Status: None   Collection Time: 01/09/23 11:03 AM  Result Value Ref Range   Vitamin B-12 515 232 - 1,245 pg/mL  CBC     Status: Abnormal   Collection Time: 01/23/23 12:28 PM  Result Value Ref Range   WBC 7.5 4.0 - 10.5 K/uL   RBC 4.15 3.87 - 5.11 MIL/uL   Hemoglobin 11.7 (L) 12.0 - 15.0  g/dL   HCT 09.6 04.5 - 40.9 %   MCV 90.4 80.0 - 100.0 fL   MCH 28.2 26.0 - 34.0 pg   MCHC 31.2 30.0 - 36.0 g/dL   RDW 81.1 91.4 - 78.2 %   Platelets 254 150 - 400 K/uL   nRBC 0.3 (H) 0.0 - 0.2 %    Comment: Performed at [facility], [address] [place], [place] [address]  Comprehensive metabolic panel     Status: Abnormal   Collection Time: 01/23/23 12:28 PM  Result Value Ref Range   Sodium 137 135 - 145 mmol/L   Potassium 4.0 3.5 - 5.1 mmol/L   Chloride 104 98 - 111 mmol/L   CO2 26 22 - 32 mmol/L   Glucose, Bld 85 70 - 99 mg/dL    Comment: Glucose reference range applies only to samples taken after fasting for at least 8 hours.   BUN 21 (H) 6 - 20 mg/dL   Creatinine, Ser 3.08 0.44 - 1.00 mg/dL   Calcium 8.5 (L) 8.9 - 10.3 mg/dL   Total Protein 7.6 6.5 - 8.1 g/dL   Albumin 3.8 3.5 - 5.0 g/dL   AST 18 15 - 41 U/L   ALT 14 0 - 44 U/L   Alkaline Phosphatase 82 38 - 126 U/L   Total Bilirubin 0.8 0.3 - 1.2 mg/dL   GFR, Estimated >65 >78 mL/min    Comment: (NOTE) Calculated using the CKD-EPI Creatinine Equation (2021)    Anion gap 7 5 - 15    Comment: Performed at [facility], [address] [place], [place] [address]  Brain natriuretic peptide     Status: None   Collection Time: 01/23/23 12:28 PM  Result Value Ref Range   B Natriuretic Peptide 89.6 0.0 - 100.0 pg/mL    Comment: Performed at [facility], [address] [place], [place] [address]  Troponin I (High Sensitivity)     Status: None   Collection Time: 01/23/23 12:28 PM  Result Value Ref Range   Troponin I (High Sensitivity) 4 <18 ng/L    Comment: (NOTE) Elevated high sensitivity troponin I (hsTnI) values and significant  changes across serial measurements may suggest ACS but many other  chronic and acute conditions are known to elevate hsTnI results.  Refer to the "Links" section for chest pain algorithms and additional  guidance. Performed at [facility], [address] [place], [place] [address]        Assessment & Plan:   Problem List Items Addressed This  Visit   None Visit Diagnoses     Intermittent claudication (HCC)    -  Primary   Relevant Orders   US ARTERIAL ABI (SCREENING LOWER EXTREMITY)   CMP14+EGFR   Iron, TIBC and Ferritin Panel   Other fatigue       Relevant Orders   CMP14+EGFR   Iron, TIBC and Ferritin Panel   Abnormal blood chemistry       Relevant Orders   CMP14+EGFR   Iron, TIBC and Ferritin Panel      Sending referral for colonoscopy.   Return as scheduled for now - will call to change if needed after ABI.   Total time spent: 20 minutes  Miki Kins, FNP  02/05/2023   This document may have been prepared by [facility] Voice Recognition software and as such may include unintentional dictation errors.

## 2023-02-15 ENCOUNTER — Ambulatory Visit: Payer: Medicare HMO

## 2023-02-15 DIAGNOSIS — I739 Peripheral vascular disease, unspecified: Secondary | ICD-10-CM | POA: Diagnosis not present

## 2023-02-20 ENCOUNTER — Other Ambulatory Visit: Payer: Self-pay | Admitting: Family

## 2023-02-20 DIAGNOSIS — Z1211 Encounter for screening for malignant neoplasm of colon: Secondary | ICD-10-CM

## 2023-02-20 MED ORDER — FUROSEMIDE 20 MG PO TABS: 40.0000 mg | ORAL_TABLET | Freq: Every day | ORAL | 0 refills | Status: DC

## 2023-03-02 ENCOUNTER — Encounter: Payer: Self-pay | Admitting: Family

## 2023-03-26 ENCOUNTER — Ambulatory Visit (INDEPENDENT_AMBULATORY_CARE_PROVIDER_SITE_OTHER): Payer: Medicare HMO | Admitting: Family

## 2023-03-26 ENCOUNTER — Ambulatory Visit
Admission: RE | Admit: 2023-03-26 | Discharge: 2023-03-26 | Disposition: A | Discharge status: Home / Self Care | Payer: Medicare HMO | Attending: Family | Admitting: Family

## 2023-03-26 ENCOUNTER — Ambulatory Visit
Admission: RE | Admit: 2023-03-26 | Discharge: 2023-03-26 | Disposition: A | Discharge status: Home / Self Care | Payer: Medicare HMO | Source: Ambulatory Visit | Attending: Family | Admitting: Family

## 2023-03-26 VITALS — BP 125/70 | HR 56 | Ht 66.5 in | Wt 261.4 lb

## 2023-03-26 DIAGNOSIS — M79604 Pain in right leg: Secondary | ICD-10-CM | POA: Insufficient documentation

## 2023-03-26 DIAGNOSIS — M25551 Pain in right hip: Secondary | ICD-10-CM | POA: Diagnosis not present

## 2023-03-26 DIAGNOSIS — M79605 Pain in left leg: Secondary | ICD-10-CM | POA: Insufficient documentation

## 2023-03-26 DIAGNOSIS — R7303 Prediabetes: Secondary | ICD-10-CM

## 2023-03-26 DIAGNOSIS — M160 Bilateral primary osteoarthritis of hip: Secondary | ICD-10-CM | POA: Diagnosis not present

## 2023-03-26 DIAGNOSIS — I10 Essential (primary) hypertension: Secondary | ICD-10-CM

## 2023-04-04 ENCOUNTER — Emergency Department: Payer: Medicare HMO

## 2023-04-04 ENCOUNTER — Emergency Department
Admission: EM | Admit: 2023-04-04 | Discharge: 2023-04-04 | Disposition: A | Discharge status: Home / Self Care | Payer: Medicare HMO | Attending: Emergency Medicine | Admitting: Emergency Medicine

## 2023-04-04 ENCOUNTER — Encounter: Payer: Self-pay | Admitting: Emergency Medicine

## 2023-04-04 ENCOUNTER — Other Ambulatory Visit: Payer: Self-pay

## 2023-04-04 DIAGNOSIS — M25561 Pain in right knee: Secondary | ICD-10-CM | POA: Insufficient documentation

## 2023-04-04 DIAGNOSIS — M79605 Pain in left leg: Secondary | ICD-10-CM | POA: Insufficient documentation

## 2023-04-04 DIAGNOSIS — M79604 Pain in right leg: Secondary | ICD-10-CM | POA: Diagnosis not present

## 2023-04-04 DIAGNOSIS — M7989 Other specified soft tissue disorders: Secondary | ICD-10-CM | POA: Diagnosis not present

## 2023-04-04 DIAGNOSIS — M7121 Synovial cyst of popliteal space [Baker], right knee: Secondary | ICD-10-CM | POA: Diagnosis not present

## 2023-04-04 LAB — BASIC METABOLIC PANEL
Anion gap: 6 (ref 5–15)
BUN: 17 mg/dL (ref 6–20)
CO2: 23 mmol/L (ref 22–32)
Calcium: 8.2 mg/dL — ABNORMAL LOW (ref 8.9–10.3)
Chloride: 107 mmol/L (ref 98–111)
Creatinine, Ser: 0.78 mg/dL (ref 0.44–1.00)
GFR, Estimated: >60 mL/min (ref >60)
Glucose, Bld: 148 mg/dL — ABNORMAL HIGH (ref 70–99)
Potassium: 3.5 mmol/L (ref 3.5–5.1)
Sodium: 136 mmol/L (ref 135–145)

## 2023-04-04 LAB — CBC WITH DIFFERENTIAL/PLATELET
Abs Immature Granulocytes: 0.01 10*3/uL (ref 0.00–0.07)
Basophils Absolute: 0 10*3/uL (ref 0.0–0.1)
Basophils Relative: 0 %
Eosinophils Absolute: 0.2 10*3/uL (ref 0.0–0.5)
Eosinophils Relative: 3 %
HCT: 39.6 % (ref 36.0–46.0)
Hemoglobin: 12.2 g/dL (ref 12.0–15.0)
Immature Granulocytes: 0 %
Lymphocytes Relative: 26 %
Lymphs Abs: 2.1 10*3/uL (ref 0.7–4.0)
MCH: 27.4 pg (ref 26.0–34.0)
MCHC: 30.8 g/dL (ref 30.0–36.0)
MCV: 89 fL (ref 80.0–100.0)
Monocytes Absolute: 0.6 10*3/uL (ref 0.1–1.0)
Monocytes Relative: 7 %
Neutro Abs: 5.2 10*3/uL (ref 1.7–7.7)
Neutrophils Relative %: 64 %
Platelets: 260 10*3/uL (ref 150–400)
RBC: 4.45 MIL/uL (ref 3.87–5.11)
RDW: 14.8 % (ref 11.5–15.5)
WBC: 8.1 10*3/uL (ref 4.0–10.5)
nRBC: 0 % (ref 0.0–0.2)

## 2023-04-04 NOTE — ED Notes (Signed)
RN to room. No pt in room.

## 2023-04-04 NOTE — ED Notes (Signed)
Pt now in room from [place].

## 2023-04-04 NOTE — ED Triage Notes (Addendum)
Pt in with R leg pain x 1 mo, worsening and described as "aching" pain from L thigh down to L foot. Pt also states her R knee is swollen. Denies any cp or sob. States she takes a blood thinner daily, also has noticed increased LLE swelling

## 2023-04-04 NOTE — Discharge Instructions (Signed)
Follow-up with orthopedics.  Call [facility] for an appointment.  Return emergency department worsening

## 2023-04-04 NOTE — ED Provider Notes (Signed)
[facility] Provider Note    Event Date/Time   First MD Initiated Contact with Patient 04/04/23 1456     (approximate)   History   Leg Pain   HPI  Sarah Medina is a 57 y.o. female with history of MI, seizures, hypercholesterolemia, presents emergency department with concerns of right knee pain and swelling in the leg.  Symptoms have been ongoing for 2 to 3 days.  This had chronic knee pain for over a month.  Denies chest pain or shortness of breath      Physical Exam   Triage Vital Signs: ED Triage Vitals  Encounter Vitals Group     BP 04/04/23 1359 (!) 96/55     Systolic BP Percentile --      Diastolic BP Percentile --      Pulse Rate 04/04/23 1359 (!) 59     Resp 04/04/23 1359 20     Temp 04/04/23 1359 97.8 F (36.6 C)     Temp Source 04/04/23 1359 Oral     SpO2 04/04/23 1359 97 %     Weight 04/04/23 1400 261 lb 6.4 oz (118.6 kg)     Height --      Head Circumference --      Peak Flow --      Pain Score 04/04/23 1359 9     Pain Loc --      Pain Education --      Exclude from Growth Chart --     Most recent vital signs: Vitals:   04/04/23 1359  BP: (!) 96/55  Pulse: (!) 59  Resp: 20  Temp: 97.8 F (36.6 C)  SpO2: 97%     General: Awake, no distress.   CV:  Good peripheral perfusion. regular rate and  rhythm Resp:  Normal effort. Lungs CTA Abd:  No distention.   Other:  Right leg is more swollen than the left, tender at the posterior portion, neurovascular is intact   ED Results / Procedures / Treatments   Labs (all labs ordered are listed, but only abnormal results are displayed) Labs Reviewed  BASIC METABOLIC PANEL - Abnormal; Notable for the following components:      Result Value   Glucose, Bld 148 (*)    Calcium 8.2 (*)    All other components within normal limits  CBC WITH DIFFERENTIAL/PLATELET     EKG     RADIOLOGY Ultrasound bilateral for DVT    PROCEDURES:   Procedures   MEDICATIONS  ORDERED IN ED: Medications - No data to display   IMPRESSION / MDM / ASSESSMENT AND PLAN / ED COURSE  I reviewed the triage vital signs and the nursing notes.                              Differential diagnosis includes, but is not limited to, DVT, CHF, Baker's cyst, edema  Patient's presentation is most consistent with acute illness / injury with system symptoms.   Swelling is more one-sided so I favor DVT versus CHF.  No pitting edema noted in the lower extremities.  Area is tender at the posterior part of the right knee.  Ultrasound independently reviewed and interpreted by me as being negative for DVT bilaterally  I did discuss these findings with the patient.  She is follow-up with her regular doctor.  Return emergency department if worsening.  See orthopedics for the knee pain.  Of note  she does have a Baker's cyst on the ultrasound.  She states she understands.  She is to stay on her blood thinners at this time.  Discharged stable condition.      FINAL CLINICAL IMPRESSION(S) / ED DIAGNOSES   Final diagnoses:  Right leg pain     Rx / DC Orders   ED Discharge Orders     None        Note:  This document was prepared using Dragon voice recognition software and may include unintentional dictation errors.    Faythe Ghee, PA-C 04/04/23 1641    Trinna Post, MD 04/05/23 [phone]

## 2023-04-10 ENCOUNTER — Other Ambulatory Visit: Payer: Self-pay | Admitting: Family

## 2023-04-11 DIAGNOSIS — G8929 Other chronic pain: Secondary | ICD-10-CM | POA: Diagnosis not present

## 2023-04-11 DIAGNOSIS — M25561 Pain in right knee: Secondary | ICD-10-CM | POA: Diagnosis not present

## 2023-04-11 DIAGNOSIS — M1711 Unilateral primary osteoarthritis, right knee: Secondary | ICD-10-CM | POA: Diagnosis not present

## 2023-04-11 DIAGNOSIS — R6889 Other general symptoms and signs: Secondary | ICD-10-CM | POA: Diagnosis not present

## 2023-04-13 ENCOUNTER — Other Ambulatory Visit
Admission: RE | Admit: 2023-04-13 | Discharge: 2023-04-13 | Disposition: A | Discharge status: Home / Self Care | Payer: Medicare HMO | Attending: Nurse Practitioner | Admitting: Nurse Practitioner

## 2023-04-13 ENCOUNTER — Ambulatory Visit (INDEPENDENT_AMBULATORY_CARE_PROVIDER_SITE_OTHER): Payer: Medicare HMO | Admitting: Family

## 2023-04-13 ENCOUNTER — Encounter: Payer: Self-pay | Admitting: Family

## 2023-04-13 VITALS — BP 141/82 | HR 50 | Ht 66.0 in | Wt 263.4 lb

## 2023-04-13 DIAGNOSIS — E559 Vitamin D deficiency, unspecified: Secondary | ICD-10-CM

## 2023-04-13 DIAGNOSIS — M79604 Pain in right leg: Secondary | ICD-10-CM

## 2023-04-13 DIAGNOSIS — I739 Peripheral vascular disease, unspecified: Secondary | ICD-10-CM

## 2023-04-13 DIAGNOSIS — R6889 Other general symptoms and signs: Secondary | ICD-10-CM | POA: Diagnosis not present

## 2023-04-13 DIAGNOSIS — E782 Mixed hyperlipidemia: Secondary | ICD-10-CM

## 2023-04-13 DIAGNOSIS — Z1159 Encounter for screening for other viral diseases: Secondary | ICD-10-CM

## 2023-04-13 DIAGNOSIS — R7303 Prediabetes: Secondary | ICD-10-CM

## 2023-04-13 DIAGNOSIS — I10 Essential (primary) hypertension: Secondary | ICD-10-CM

## 2023-04-13 DIAGNOSIS — Z23 Encounter for immunization: Secondary | ICD-10-CM | POA: Diagnosis not present

## 2023-04-13 DIAGNOSIS — I25119 Atherosclerotic heart disease of native coronary artery with unspecified angina pectoris: Secondary | ICD-10-CM

## 2023-04-13 DIAGNOSIS — E538 Deficiency of other specified B group vitamins: Secondary | ICD-10-CM

## 2023-04-13 DIAGNOSIS — M79605 Pain in left leg: Secondary | ICD-10-CM

## 2023-04-13 NOTE — Progress Notes (Unsigned)
Established Patient Office Visit  Subjective:  Patient ID: Sarah Medina, female    DOB: 01/02/1966  Age: 57 y.o. MRN: [id number]  Chief Complaint  Patient presents with   Follow-up    Right leg-pain, seen at ortho and was told she has bone bone    Patient is here today for her 3 months follow up.  She has been feeling fairly well since last appointment.   She does not have additional concerns to discuss today.  She has been to see ortho and she is seeing them again in a few weeks.   Labs are due today and she needs a flu shot.  She needs refills.   I have reviewed her active problem list, medication list, allergies, notes from last encounter, lab results for her appointment today.      No other concerns at this time.   Past Medical History:  Diagnosis Date   Arthritis    hips, right leg   CAD in native artery, RCA, LAD and LCX 07/23/2015   Carotid artery occlusion    Cellulitis of left lower extremity    Chest pain, rule out acute myocardial infarction 07/11/2016   H/O myocardial infarction less than 8 weeks- STEMI inf wall 07/20/15- stent to RCA 07/20/2015   inf wall STEMI with DES to RCA   Hypercholesteremia    Hypertension    Periorbital edema    treated with steroids   Positive D dimer    Seizures (HCC)    as teenager, ages [age].  none since   Sepsis (HCC) 12/25/2018   ST elevation myocardial infarction (STEMI) of inferior wall (HCC) 07/20/2015   STEMI (ST elevation myocardial infarction) (HCC) 07/23/2015   UTI (urinary tract infection)    Ventricular fibrillation (HCC) 07/23/2015    Past Surgical History:  Procedure Laterality Date   CARDIAC CATHETERIZATION N/A 07/20/2015   Procedure: Left Heart Cath and Coronary Angiography;  Surgeon: Marcina Millard, MD;  Location: [facility] INVASIVE CV LAB;  Service: Cardiovascular;  Laterality: N/A;   CARDIAC CATHETERIZATION N/A 07/20/2015   Procedure: Coronary Stent Intervention;  Surgeon: Marcina Millard, MD;   Location: [facility] INVASIVE CV LAB;  Service: Cardiovascular;  Laterality: N/A;   CARDIAC CATHETERIZATION N/A 07/23/2015   Procedure: Left Heart Cath and Coronary Angiography;  Surgeon: Runell Gess, MD;  Location: [facility] INVASIVE CV LAB;  Service: Cardiovascular;  Laterality: N/A;   CARDIAC CATHETERIZATION N/A 07/23/2015   Procedure: Coronary Balloon Angioplasty;  Surgeon: Runell Gess, MD;  Location: [facility] INVASIVE CV LAB;  Service: Cardiovascular;  Laterality: N/A;   CARDIAC CATHETERIZATION N/A 07/12/2016   Procedure: Left Heart Cath;  Surgeon: Dalia Heading, MD;  Location: [facility] INVASIVE CV LAB;  Service: Cardiovascular;  Laterality: N/A;   CARDIAC CATHETERIZATION N/A 07/12/2016   Procedure: Left Heart Cath and Coronary Angiography;  Surgeon: Marcina Millard, MD;  Location: [facility] INVASIVE CV LAB;  Service: Cardiovascular;  Laterality: N/A;   CARDIAC CATHETERIZATION N/A 07/12/2016   Procedure: Intravascular Pressure Wire/FFR Study;  Surgeon: Marcina Millard, MD;  Location: [facility] INVASIVE CV LAB;  Service: Cardiovascular;  Laterality: N/A;   CARDIAC CATHETERIZATION N/A 07/12/2016   Procedure: Coronary Stent Intervention;  Surgeon: Marcina Millard, MD;  Location: [facility] INVASIVE CV LAB;  Service: Cardiovascular;  Laterality: N/A;   KNEE SURGERY Right 15 years ago    Social History   Socioeconomic History   Marital status: Married    Spouse name: Not on file   Number of children: Not on file  Years of education: Not on file   Highest education level: Not on file  Occupational History   Not on file  Tobacco Use   Smoking status: Never   Smokeless tobacco: Former    Types: Snuff    Quit date: 2014  Vaping Use   Vaping status: Never Used  Substance and Sexual Activity   Alcohol use: No   Drug use: Never   Sexual activity: Yes    Birth control/protection: None  Other Topics Concern   Not on file  Social History Narrative   Not on file   Social Determinants of Health    Financial Resource Strain: Not on file  Food Insecurity: Not on file  Transportation Needs: Not on file  Physical Activity: Not on file  Stress: Not on file  Social Connections: Not on file  Intimate Partner Violence: Not on file    Family History  Problem Relation Age of Onset   Breast cancer Sister 26    Allergies  Allergen Reactions   Enalapril Swelling    Swelling face    Review of Systems  Musculoskeletal:  Positive for joint pain.  All other systems reviewed and are negative.      Objective:   BP (!) 141/82   Pulse (!) 50   Ht 5\' 6"  (1.676 m)   Wt 263 lb 6.4 oz (119.5 kg)   LMP 05/19/2015 (LMP Unknown)   SpO2 97%   BMI 42.51 kg/m   Vitals:   04/13/23 0907  BP: (!) 141/82  Pulse: (!) 50  Height: 5\' 6"  (1.676 m)  Weight: 263 lb 6.4 oz (119.5 kg)  SpO2: 97%  BMI (Calculated): 42.53    Physical Exam Vitals and nursing note reviewed.  Constitutional:      Appearance: Normal appearance. She is normal weight.  HENT:     Head: Normocephalic.  Eyes:     Extraocular Movements: Extraocular movements intact.     Conjunctiva/sclera: Conjunctivae normal.     Pupils: Pupils are equal, round, and reactive to light.  Cardiovascular:     Rate and Rhythm: Normal rate.  Pulmonary:     Effort: Pulmonary effort is normal.  Musculoskeletal:     Right lower leg: No edema.     Left lower leg: No edema.  Neurological:     General: No focal deficit present.     Mental Status: She is alert and oriented to person, place, and time. Mental status is at baseline.  Psychiatric:        Mood and Affect: Mood normal.        Behavior: Behavior normal.        Thought Content: Thought content normal.        Judgment: Judgment normal.      No results found for any visits on 04/13/23.  Recent Results (from the past 2160 hour(s))  CBC     Status: Abnormal   Collection Time: 01/23/23 12:28 PM  Result Value Ref Range   WBC 7.5 4.0 - 10.5 K/uL   RBC 4.15 3.87 - 5.11  MIL/uL   Hemoglobin 11.7 (L) 12.0 - 15.0 g/dL   HCT 16.1 09.6 - 04.5 %   MCV 90.4 80.0 - 100.0 fL   MCH 28.2 26.0 - 34.0 pg   MCHC 31.2 30.0 - 36.0 g/dL   RDW 40.9 81.1 - 91.4 %   Platelets 254 150 - 400 K/uL   nRBC 0.3 (H) 0.0 - 0.2 %    Comment: Performed at  [facility], [address] [place], [place] [address]  Comprehensive metabolic panel     Status: Abnormal   Collection Time: 01/23/23 12:28 PM  Result Value Ref Range   Sodium 137 135 - 145 mmol/L   Potassium 4.0 3.5 - 5.1 mmol/L   Chloride 104 98 - 111 mmol/L   CO2 26 22 - 32 mmol/L   Glucose, Bld 85 70 - 99 mg/dL    Comment: Glucose reference range applies only to samples taken after fasting for at least 8 hours.   BUN 21 (H) 6 - 20 mg/dL   Creatinine, Ser 6.04 0.44 - 1.00 mg/dL   Calcium 8.5 (L) 8.9 - 10.3 mg/dL   Total Protein 7.6 6.5 - 8.1 g/dL   Albumin 3.8 3.5 - 5.0 g/dL   AST 18 15 - 41 U/L   ALT 14 0 - 44 U/L   Alkaline Phosphatase 82 38 - 126 U/L   Total Bilirubin 0.8 0.3 - 1.2 mg/dL   GFR, Estimated >54 >09 mL/min    Comment: (NOTE) Calculated using the CKD-EPI Creatinine Equation (2021)    Anion gap 7 5 - 15    Comment: Performed at [facility], [address] [place], [place] [address]  Brain natriuretic peptide     Status: None   Collection Time: 01/23/23 12:28 PM  Result Value Ref Range   B Natriuretic Peptide 89.6 0.0 - 100.0 pg/mL    Comment: Performed at [facility], [address] [place], [place] [address]  Troponin I (High Sensitivity)     Status: None   Collection Time: 01/23/23 12:28 PM  Result Value Ref Range   Troponin I (High Sensitivity) 4 <18 ng/L    Comment: (NOTE) Elevated high sensitivity troponin I (hsTnI) values and significant  changes across serial measurements may suggest ACS but many other  chronic and acute conditions are known to elevate hsTnI results.  Refer to the "Links" section for chest pain algorithms and additional   guidance. Performed at [facility], [address] [place], [place] [address]   Comprehensive metabolic panel     Status: None   Collection Time: 02/05/23 10:58 AM  Result Value Ref Range   Sodium 141 135 - 145 mmol/L   Potassium 4.6 3.5 - 5.1 mmol/L   Chloride 106 98 - 111 mmol/L   CO2 27 22 - 32 mmol/L   Glucose, Bld 70 70 - 99 mg/dL    Comment: Glucose reference range applies only to samples taken after fasting for at least 8 hours.   BUN 16 6 - 20 mg/dL   Creatinine, Ser 0.86 0.44 - 1.00 mg/dL   Calcium 8.9 8.9 - 57.8 mg/dL   Total Protein 7.2 6.5 - 8.1 g/dL   Albumin 3.6 3.5 - 5.0 g/dL   AST 19 15 - 41 U/L   ALT 17 0 - 44 U/L   Alkaline Phosphatase 86 38 - 126 U/L   Total Bilirubin 0.9 0.3 - 1.2 mg/dL   GFR, Estimated >46 >96 mL/min    Comment: (NOTE) Calculated using the CKD-EPI Creatinine Equation (2021)    Anion gap 8 5 - 15    Comment: Performed at [facility], [address] [place], [place] [address]  Iron and TIBC     Status: None   Collection Time: 02/05/23 10:58 AM  Result Value Ref Range   Iron 59 28 - 170 ug/dL   TIBC 413 244 - 010 ug/dL  Saturation Ratios 20 10.4 - 31.8 %   UIBC 231 ug/dL    Comment: Performed at [facility], [address] [place], [place] [address]  CBC with Differential     Status: None   Collection Time: 04/04/23  2:14 PM  Result Value Ref Range   WBC 8.1 4.0 - 10.5 K/uL   RBC 4.45 3.87 - 5.11 MIL/uL   Hemoglobin 12.2 12.0 - 15.0 g/dL   HCT 60.4 54.0 - 98.1 %   MCV 89.0 80.0 - 100.0 fL   MCH 27.4 26.0 - 34.0 pg   MCHC 30.8 30.0 - 36.0 g/dL   RDW 19.1 47.8 - 29.5 %   Platelets 260 150 - 400 K/uL   nRBC 0.0 0.0 - 0.2 %   Neutrophils Relative % 64 %   Neutro Abs 5.2 1.7 - 7.7 K/uL   Lymphocytes Relative 26 %   Lymphs Abs 2.1 0.7 - 4.0 K/uL   Monocytes Relative 7 %   Monocytes Absolute 0.6 0.1 - 1.0 K/uL   Eosinophils Relative 3 %   Eosinophils Absolute 0.2 0.0 - 0.5 K/uL   Basophils  Relative 0 %   Basophils Absolute 0.0 0.0 - 0.1 K/uL   Immature Granulocytes 0 %   Abs Immature Granulocytes 0.01 0.00 - 0.07 K/uL    Comment: Performed at [facility], [address], [place], [place] [address]  Basic metabolic panel     Status: Abnormal   Collection Time: 04/04/23  2:14 PM  Result Value Ref Range   Sodium 136 135 - 145 mmol/L   Potassium 3.5 3.5 - 5.1 mmol/L   Chloride 107 98 - 111 mmol/L   CO2 23 22 - 32 mmol/L   Glucose, Bld 148 (H) 70 - 99 mg/dL    Comment: Glucose reference range applies only to samples taken after fasting for at least 8 hours.   BUN 17 6 - 20 mg/dL   Creatinine, Ser 8.65 0.44 - 1.00 mg/dL   Calcium 8.2 (L) 8.9 - 10.3 mg/dL   GFR, Estimated >78 >46 mL/min    Comment: (NOTE) Calculated using the CKD-EPI Creatinine Equation (2021)    Anion gap 6 5 - 15    Comment: Performed at [facility], [address], [place], [place] [address]       Assessment & Plan:   Problem List Items Addressed This Visit   None   No follow-ups on file.   Total time spent: {AMA time spent:29001} minutes  Miki Kins, FNP  04/13/2023   This document may have been prepared by [facility] Voice Recognition software and as such may include unintentional dictation errors.

## 2023-04-16 ENCOUNTER — Encounter: Payer: Self-pay | Admitting: Family

## 2023-04-16 NOTE — Assessment & Plan Note (Signed)
Blood pressure well controlled with current medications.  Continue current therapy.  Will reassess at follow up.  

## 2023-04-16 NOTE — Assessment & Plan Note (Signed)
Checking labs today.  Will continue supplements as needed.  

## 2023-04-16 NOTE — Assessment & Plan Note (Signed)
Checking labs today.  Continue current therapy for lipid control. Will modify as needed based on labwork results.

## 2023-04-16 NOTE — Assessment & Plan Note (Signed)
Continue current meds.  Will adjust as needed based on results.  The patient is asked to make an attempt to improve diet and exercise patterns to aid in medical management of this problem. Addressed importance of increasing and maintaining water intake.   

## 2023-04-16 NOTE — Assessment & Plan Note (Signed)
Patient stable.  Well controlled with current therapy.   Continue current meds.  

## 2023-04-16 NOTE — Assessment & Plan Note (Signed)
Patient stable.  Well controlled with current therapy.   Continue current meds.

## 2023-04-16 NOTE — Assessment & Plan Note (Signed)
Patient is seen by Cardiology, who manage this condition.  She is well controlled with current therapy.   Will defer to them for further changes to plan of care.

## 2023-04-16 NOTE — Assessment & Plan Note (Signed)
.  A1C Continues to be in prediabetic ranges.  Will reassess at follow up after next lab check.  Patient counseled on dietary choices and verbalized understanding.  Patient educated on foods that contain carbohydrates and the need to decrease intake.  We discussed prediabetes, and what it means and the need for strict dietary control to prevent progression to type 2 diabetes.  Advised to decrease intake of sugary drinks, including sodas, sweet tea, and some juices, and of starch and sugar heavy foods (ie., potatoes, rice, bread, pasta, desserts). She verbalizes understanding and agreement with the changes discussed today.

## 2023-04-18 ENCOUNTER — Telehealth: Payer: Self-pay | Admitting: Family

## 2023-04-18 NOTE — Telephone Encounter (Signed)
Patient left VM that Marchelle Folks was supposed to send her in some medicine the other day but the pharmacy hasn't gotten anything. Did not state what kind of medicine.

## 2023-04-19 NOTE — Telephone Encounter (Signed)
Spoke with patient and was informed reason for call was in reference to patients husband, new task has been made under husband.

## 2023-04-22 ENCOUNTER — Encounter: Payer: Self-pay | Admitting: Family

## 2023-04-22 NOTE — Assessment & Plan Note (Signed)
Getting x-ray of right hip.  Will call pt once we have results.   Pt. Also has an appt soon with ortho.  Hopefully they will be able to help [place].   Recheck at follow up.

## 2023-05-01 DIAGNOSIS — R6889 Other general symptoms and signs: Secondary | ICD-10-CM | POA: Diagnosis not present

## 2023-05-01 DIAGNOSIS — M1711 Unilateral primary osteoarthritis, right knee: Secondary | ICD-10-CM | POA: Diagnosis not present

## 2023-05-03 DIAGNOSIS — R6889 Other general symptoms and signs: Secondary | ICD-10-CM | POA: Diagnosis not present

## 2023-05-03 DIAGNOSIS — I2510 Atherosclerotic heart disease of native coronary artery without angina pectoris: Secondary | ICD-10-CM | POA: Diagnosis not present

## 2023-05-03 DIAGNOSIS — E785 Hyperlipidemia, unspecified: Secondary | ICD-10-CM | POA: Diagnosis not present

## 2023-05-03 DIAGNOSIS — Z01810 Encounter for preprocedural cardiovascular examination: Secondary | ICD-10-CM | POA: Diagnosis not present

## 2023-05-03 DIAGNOSIS — I739 Peripheral vascular disease, unspecified: Secondary | ICD-10-CM | POA: Diagnosis not present

## 2023-05-03 DIAGNOSIS — I2119 ST elevation (STEMI) myocardial infarction involving other coronary artery of inferior wall: Secondary | ICD-10-CM | POA: Diagnosis not present

## 2023-05-03 DIAGNOSIS — I4901 Ventricular fibrillation: Secondary | ICD-10-CM | POA: Diagnosis not present

## 2023-05-03 DIAGNOSIS — R600 Localized edema: Secondary | ICD-10-CM | POA: Diagnosis not present

## 2023-05-03 DIAGNOSIS — Z955 Presence of coronary angioplasty implant and graft: Secondary | ICD-10-CM | POA: Diagnosis not present

## 2023-05-07 ENCOUNTER — Other Ambulatory Visit: Payer: Self-pay | Admitting: Cardiology

## 2023-05-07 ENCOUNTER — Other Ambulatory Visit: Payer: Medicare HMO

## 2023-05-07 DIAGNOSIS — R6889 Other general symptoms and signs: Secondary | ICD-10-CM | POA: Diagnosis not present

## 2023-05-07 DIAGNOSIS — Z955 Presence of coronary angioplasty implant and graft: Secondary | ICD-10-CM

## 2023-05-07 DIAGNOSIS — Z01810 Encounter for preprocedural cardiovascular examination: Secondary | ICD-10-CM

## 2023-05-07 DIAGNOSIS — I2119 ST elevation (STEMI) myocardial infarction involving other coronary artery of inferior wall: Secondary | ICD-10-CM

## 2023-05-15 ENCOUNTER — Ambulatory Visit (INDEPENDENT_AMBULATORY_CARE_PROVIDER_SITE_OTHER): Payer: Medicare HMO | Admitting: Family

## 2023-05-15 ENCOUNTER — Other Ambulatory Visit: Payer: Self-pay

## 2023-05-15 ENCOUNTER — Other Ambulatory Visit: Payer: Self-pay | Admitting: Family

## 2023-05-15 ENCOUNTER — Encounter: Payer: Self-pay | Admitting: Family

## 2023-05-15 VITALS — BP 120/80 | HR 55 | Ht 66.0 in | Wt 258.4 lb

## 2023-05-15 DIAGNOSIS — I10 Essential (primary) hypertension: Secondary | ICD-10-CM | POA: Diagnosis not present

## 2023-05-15 DIAGNOSIS — E782 Mixed hyperlipidemia: Secondary | ICD-10-CM

## 2023-05-15 DIAGNOSIS — Z0000 Encounter for general adult medical examination without abnormal findings: Secondary | ICD-10-CM | POA: Diagnosis not present

## 2023-05-15 DIAGNOSIS — Z131 Encounter for screening for diabetes mellitus: Secondary | ICD-10-CM

## 2023-05-16 ENCOUNTER — Other Ambulatory Visit: Payer: Self-pay | Admitting: Orthopedic Surgery

## 2023-05-16 LAB — CBC WITH DIFF/PLATELET
Basophils Absolute: 0 10*3/uL (ref 0.0–0.2)
Basos: 0 %
EOS (ABSOLUTE): 0.1 10*3/uL (ref 0.0–0.4)
Eos: 2 %
Hematocrit: 46.3 % (ref 34.0–46.6)
Hemoglobin: 14.7 g/dL (ref 11.1–15.9)
Immature Grans (Abs): 0 10*3/uL (ref 0.0–0.1)
Immature Granulocytes: 0 %
Lymphocytes Absolute: 1.7 10*3/uL (ref 0.7–3.1)
Lymphs: 25 %
MCH: 27.6 pg (ref 26.6–33.0)
MCHC: 31.7 g/dL (ref 31.5–35.7)
MCV: 87 fL (ref 79–97)
Monocytes Absolute: 0.4 10*3/uL (ref 0.1–0.9)
Monocytes: 6 %
Neutrophils Absolute: 4.6 10*3/uL (ref 1.4–7.0)
Neutrophils: 67 %
Platelets: 215 10*3/uL (ref 150–450)
RBC: 5.32 x10E6/uL — ABNORMAL HIGH (ref 3.77–5.28)
RDW: 14.3 % (ref 11.7–15.4)
WBC: 6.9 10*3/uL (ref 3.4–10.8)

## 2023-05-16 LAB — CMP14+EGFR
ALT: 11 [IU]/L (ref 0–32)
AST: 17 [IU]/L (ref 0–40)
Albumin: 4.3 g/dL (ref 3.8–4.9)
Alkaline Phosphatase: 112 [IU]/L (ref 44–121)
BUN/Creatinine Ratio: 15 (ref 9–23)
BUN: 14 mg/dL (ref 6–24)
Bilirubin Total: 0.7 mg/dL (ref 0.0–1.2)
CO2: 25 mmol/L (ref 20–29)
Calcium: 9.6 mg/dL (ref 8.7–10.2)
Chloride: 100 mmol/L (ref 96–106)
Creatinine, Ser: 0.93 mg/dL (ref 0.57–1.00)
Globulin, Total: 3.1 g/dL (ref 1.5–4.5)
Glucose: 81 mg/dL (ref 70–99)
Potassium: 4.7 mmol/L (ref 3.5–5.2)
Sodium: 140 mmol/L (ref 134–144)
Total Protein: 7.4 g/dL (ref 6.0–8.5)
eGFR: 71 mL/min/{1.73_m2} (ref >59)

## 2023-05-16 LAB — LIPID PANEL
Chol/HDL Ratio: 2.6 ratio (ref 0.0–4.4)
Cholesterol, Total: 163 mg/dL (ref 100–199)
HDL: 63 mg/dL (ref >39)
LDL Chol Calc (NIH): 85 mg/dL (ref 0–99)
Triglycerides: 81 mg/dL (ref 0–149)
VLDL Cholesterol Cal: 15 mg/dL (ref 5–40)

## 2023-05-16 LAB — HEMOGLOBIN A1C
Est. average glucose Bld gHb Est-mCnc: 134 mg/dL
Hgb A1c MFr Bld: 6.3 % — ABNORMAL HIGH (ref 4.8–5.6)

## 2023-05-16 NOTE — Patient Instructions (Signed)
Health Maintenance, Female Adopting a healthy lifestyle and getting preventive care are important in promoting health and wellness. Ask your health care provider about: The right schedule for you to have regular tests and exams. Things you can do on your own to prevent diseases and keep yourself healthy. What should I know about diet, weight, and exercise? Eat a healthy diet  Eat a diet that includes plenty of vegetables, fruits, low-fat dairy products, and lean protein. Do not eat a lot of foods that are high in solid fats, added sugars, or sodium. Maintain a healthy weight Body mass index (BMI) is used to identify weight problems. It estimates body fat based on height and weight. Your health care provider can help determine your BMI and help you achieve or maintain a healthy weight. Get regular exercise Get regular exercise. This is one of the most important things you can do for your health. Most adults should: Exercise for at least 150 minutes each week. The exercise should increase your heart rate and make you sweat (moderate-intensity exercise). Do strengthening exercises at least twice a week. This is in addition to the moderate-intensity exercise. Spend less time sitting. Even light physical activity can be beneficial. Watch cholesterol and blood lipids Have your blood tested for lipids and cholesterol at 58 years of age, then have this test every 5 years. Have your cholesterol levels checked more often if: Your lipid or cholesterol levels are high. You are older than 58 years of age. You are at high risk for heart disease. What should I know about cancer screening? Depending on your health history and family history, you may need to have cancer screening at various ages. This may include screening for: Breast cancer. Cervical cancer. Colorectal cancer. Skin cancer. Lung cancer. What should I know about heart disease, diabetes, and high blood pressure? Blood pressure and heart  disease High blood pressure causes heart disease and increases the risk of stroke. This is more likely to develop in people who have high blood pressure readings or are overweight. Have your blood pressure checked: Every 3-5 years if you are 48-39 years of age. Every year if you are 64 years old or older. Diabetes Have regular diabetes screenings. This checks your fasting blood sugar level. Have the screening done: Once every three years after age 40 if you are at a normal weight and have a low risk for diabetes. More often and at a younger age if you are overweight or have a high risk for diabetes. What should I know about preventing infection? Hepatitis B If you have a higher risk for hepatitis B, you should be screened for this virus. Talk with your health care provider to find out if you are at risk for hepatitis B infection. Hepatitis C Testing is recommended for: Everyone born from [birth year] through 1966. Anyone with known risk factors for hepatitis C. Sexually transmitted infections (STIs) Get screened for STIs, including gonorrhea and chlamydia, if: You are sexually active and are younger than 58 years of age. You are older than 58 years of age and your health care provider tells you that you are at risk for this type of infection. Your sexual activity has changed since you were last screened, and you are at increased risk for chlamydia or gonorrhea. Ask your health care provider if you are at risk. Ask your health care provider about whether you are at high risk for HIV. Your health care provider may recommend a prescription medicine to help prevent HIV  infection. If you choose to take medicine to prevent HIV, you should first get tested for HIV. You should then be tested every 3 months for as long as you are taking the medicine. Pregnancy If you are about to stop having your period (premenopausal) and you may become pregnant, seek counseling before you get pregnant. Take 400 to 800  micrograms (mcg) of folic acid every day if you become pregnant. Ask for birth control (contraception) if you want to prevent pregnancy. Osteoporosis and menopause Osteoporosis is a disease in which the bones lose minerals and strength with aging. This can result in bone fractures. If you are 1 years old or older, or if you are at risk for osteoporosis and fractures, ask your health care provider if you should: Be screened for bone loss. Take a calcium or vitamin D supplement to lower your risk of fractures. Be given hormone replacement therapy (HRT) to treat symptoms of menopause. Follow these instructions at home: Alcohol use Do not drink alcohol if: Your health care provider tells you not to drink. You are pregnant, may be pregnant, or are planning to become pregnant. If you drink alcohol: Limit how much you have to: 0-1 drink a day. Know how much alcohol is in your drink. In the [place] one drink equals one 12 oz bottle of beer (355 mL), one 5 oz glass of wine (148 mL), or one 1 oz glass of hard liquor (44 mL). Lifestyle Do not use any products that contain nicotine or tobacco. These products include cigarettes, chewing tobacco, and vaping devices, such as e-cigarettes. If you need help quitting, ask your health care provider. Do not use street drugs. Do not share needles. Ask your health care provider for help if you need support or information about quitting drugs. General instructions Schedule regular health, dental, and eye exams. Stay current with your vaccines. Tell your health care provider if: You often feel depressed. You have ever been abused or do not feel safe at home. Summary Adopting a healthy lifestyle and getting preventive care are important in promoting health and wellness. Follow your health care provider's instructions about healthy diet, exercising, and getting tested or screened for diseases. Follow your health care provider's instructions on monitoring your  cholesterol and blood pressure. This information is not intended to replace advice given to you by your health care provider. Make sure you discuss any questions you have with your health care provider. Document Revised: 11/08/2020 Document Reviewed: 11/08/2020 Elsevier Patient Education  2024 [employer]  Fat and Cholesterol Restricted Eating Plan Getting too much fat and cholesterol in your diet may cause health problems. Choosing the right foods helps keep your fat and cholesterol at normal levels. This can keep you from getting certain diseases. Your doctor may recommend an eating plan that includes: Total fat: ______% or less of total calories a day. This is ______g of fat a day. Saturated fat: ______% or less of total calories a day. This is ______g of saturated fat a day. Cholesterol: less than _________mg a day. Fiber: ______g a day. What are tips for following this plan? General tips Work with your doctor to lose weight if you need to. Avoid: Foods with added sugar. Fried foods. Foods with trans fat or partially hydrogenated oils. This includes some margarines and baked goods. If you drink alcohol: Limit how much you have to: 0-1 drink a day for women who are not pregnant. 0-2 drinks a day for men. Know how much alcohol is in  a drink. In the [place] one drink equals one 12 oz bottle of beer (355 mL), one 5 oz glass of wine (148 mL), or one 1 oz glass of hard liquor (44 mL). Reading food labels Check food labels for: Trans fats. Partially hydrogenated oils. Saturated fat (g) in each serving. Cholesterol (mg) in each serving. Fiber (g) in each serving. Choose foods with healthy fats, such as: Monounsaturated fats and polyunsaturated fats. These include olive and canola oil, flaxseeds, walnuts, almonds, and seeds. Omega-3 fats. These are found in certain fish, flaxseed oil, and ground flaxseeds. Choose grain products that have whole grains. Look for the word "whole" as the  first word in the ingredient list. Cooking Cook foods using low-fat methods. These include baking, boiling, grilling, and broiling. Eat more home-cooked foods. Eat at restaurants and buffets less often. Eat less fast food. Avoid cooking using saturated fats, such as butter, cream, palm oil, palm kernel oil, and coconut oil. Meal planning  At meals, divide your plate into four equal parts: Fill one-half of your plate with vegetables, green salads, and fruit. Fill one-fourth of your plate with whole grains. Fill one-fourth of your plate with low-fat (lean) protein foods. Eat fish that is high in omega-3 fats at least two times a week. This includes mackerel, tuna, sardines, and salmon. Eat foods that are high in fiber, such as whole grains, beans, apples, pears, berries, broccoli, carrots, peas, and barley. What foods should I eat? Fruits All fresh, canned (in natural juice), or frozen fruits. Vegetables Fresh or frozen vegetables (raw, steamed, roasted, or grilled). Green salads. Grains Whole grains, such as whole wheat or whole grain breads, crackers, cereals, and pasta. Unsweetened oatmeal, bulgur, barley, quinoa, or brown rice. Corn or whole wheat flour tortillas. Meats and other protein foods Ground beef (85% or leaner), grass-fed beef, or beef trimmed of fat. Skinless chicken or [place]. Ground chicken or [place]. Pork trimmed of fat. All fish and seafood. Egg whites. Dried beans, peas, or lentils. Unsalted nuts or seeds. Unsalted canned beans. Nut butters without added sugar or oil. Dairy Low-fat or nonfat dairy products, such as skim or 1% milk, 2% or reduced-fat cheeses, low-fat and fat-free ricotta or cottage cheese, or plain low-fat and nonfat yogurt. Fats and oils Tub margarine without trans fats. Light or reduced-fat mayonnaise and salad dressings. Avocado. Olive, canola, sesame, or safflower oils. The items listed above may not be a complete list of foods and beverages you can  eat. Contact a dietitian for more information. What foods should I avoid? Fruits Canned fruit in heavy syrup. Fruit in cream or butter sauce. Fried fruit. Vegetables Vegetables cooked in cheese, cream, or butter sauce. Fried vegetables. Grains White bread. White pasta. White rice. Cornbread. Bagels, pastries, and croissants. Crackers and snack foods that contain trans fat and hydrogenated oils. Meats and other protein foods Fatty cuts of meat. Ribs, chicken wings, bacon, sausage, bologna, salami, chitterlings, fatback, hot dogs, bratwurst, and packaged lunch meats. Liver and organ meats. Whole eggs and egg yolks. Chicken and [place] with skin. Fried meat. Dairy Whole or 2% milk, cream, half-and-half, and cream cheese. Whole milk cheeses. Whole-fat or sweetened yogurt. Full-fat cheeses. Nondairy creamers and whipped toppings. Processed cheese, cheese spreads, and cheese curds. Fats and oils Butter, stick margarine, lard, shortening, ghee, or bacon fat. Coconut, palm kernel, and palm oils. Beverages Alcohol. Sugar-sweetened drinks such as sodas, lemonade, and fruit drinks. Sweets and desserts Corn syrup, sugars, honey, and molasses. Candy. Jam and jelly. Syrup. Sweetened  cereals. Cookies, pies, cakes, donuts, muffins, and ice cream. The items listed above may not be a complete list of foods and beverages you should avoid. Contact a dietitian for more information. Summary Choosing the right foods helps keep your fat and cholesterol at normal levels. This can keep you from getting certain diseases. At meals, fill one-half of your plate with vegetables, green salads, and fruits. Eat high fiber foods, like whole grains, beans, apples, pears, berries, carrots, peas, and barley. Limit added sugar, saturated fats, alcohol, and fried foods. This information is not intended to replace advice given to you by your health care provider. Make sure you discuss any questions you have with your health care  provider. Document Revised: 10/29/2020 Document Reviewed: 10/29/2020 Elsevier Patient Education  2024 [employer]  American Heart Association [facility]) Exercise Recommendation  Being physically active is important to prevent heart disease and stroke, the nation's No. 1and No. 5killers. To improve overall cardiovascular health, we suggest at least 150 minutes per week of moderate exercise or 75 minutes per week of vigorous exercise (or a combination of moderate and vigorous activity). Thirty minutes a day, five times a week is an easy goal to remember. You will also experience benefits even if you divide your time into two or three segments of 10 to 15 minutes per day.  For people who would benefit from lowering their blood pressure or cholesterol, we recommend 40 minutes of aerobic exercise of moderate to vigorous intensity three to four times a week to lower the risk for heart attack and stroke.  Physical activity is anything that makes you move your body and burn calories.  This includes things like climbing stairs or playing sports. Aerobic exercises benefit your heart, and include walking, jogging, swimming or biking. Strength and stretching exercises are best for overall stamina and flexibility.  The simplest, positive change you can make to effectively improve your heart health is to start walking. It's enjoyable, free, easy, social and great exercise. A walking program is flexible and boasts high success rates because people can stick with it. It's easy for walking to become a regular and satisfying part of life.   For Overall Cardiovascular Health: At least 30 minutes of moderate-intensity aerobic activity at least 5 days per week for a total of 150  OR  At least 25 minutes of vigorous aerobic activity at least 3 days per week for a total of 75 minutes; or a combination of moderate- and vigorous-intensity aerobic activity  AND  Moderate- to high-intensity muscle-strengthening activity  at least 2 days per week for additional health benefits.  For Lowering Blood Pressure and Cholesterol An average 40 minutes of moderate- to vigorous-intensity aerobic activity 3 or 4 times per week  What if I can't make it to the time goal? Something is always better than nothing! And everyone has to start somewhere. Even if you've been sedentary for years, today is the day you can begin to make healthy changes in your life. If you don't think you'll make it for 30 or 40 minutes, set a reachable goal for today. You can work up toward your overall goal by increasing your time as you get stronger. Don't let all-or-nothing thinking rob you of doing what you can every day.  Source:http://www.heart.org

## 2023-05-16 NOTE — Progress Notes (Signed)
Annual Wellness Visit  Patient: Sarah Medina, Female    DOB: Dec 26, 1965, 57 y.o.   MRN: [id number] Visit Date: 05/16/2023  Today's Provider: Miki Kins, FNP  Subjective:    Chief Complaint  Patient presents with   Annual Exam    AWV   Sarah Medina is a 57 y.o. female who presents today for her Annual Wellness Visit.  Patient is here today for her Medicare Annual Wellness Exam.  She is feeling well overall, though she is preparing to have her knee replacement surgery within the next few months.     Past Medical History:  Diagnosis Date   Arthritis    hips, right leg   CAD in native artery, RCA, LAD and LCX 07/23/2015   Carotid artery occlusion    Cellulitis of left lower extremity    Chest pain, rule out acute myocardial infarction 07/11/2016   H/O cardiac catheterization 08/02/2015   Overview:   succcessful PCI with 2.25 x 23mm Xience Alphine stent proximal RCA     H/O myocardial infarction less than 8 weeks- STEMI inf wall 07/20/15- stent to RCA 07/20/2015   inf wall STEMI with DES to RCA   Hypercholesteremia    Hypertension    Periorbital edema    treated with steroids   Positive D dimer    Seizures (HCC)    as teenager, ages [age].  none since   Sepsis (HCC) 12/25/2018   ST elevation myocardial infarction (STEMI) of inferior wall (HCC) 07/20/2015   STEMI (ST elevation myocardial infarction) (HCC) 07/23/2015   UTI (urinary tract infection)    Ventricular fibrillation (HCC) 07/23/2015   Past Surgical History:  Procedure Laterality Date   CARDIAC CATHETERIZATION N/A 07/20/2015   Procedure: Left Heart Cath and Coronary Angiography;  Surgeon: Marcina Millard, MD;  Location: [facility] INVASIVE CV LAB;  Service: Cardiovascular;  Laterality: N/A;   CARDIAC CATHETERIZATION N/A 07/20/2015   Procedure: Coronary Stent Intervention;  Surgeon: Marcina Millard, MD;  Location: [facility] INVASIVE CV [facility];  Service: Cardiovascular;  Laterality: N/A;   CARDIAC  CATHETERIZATION N/A 07/23/2015   Procedure: Left Heart Cath and Coronary Angiography;  Surgeon: Runell Gess, MD;  Location: [facility] INV[facility]IVE CV LAB;  Service: Cardiovascular;  Laterality: N/A;   CARDIAC CATHETERIZATION N/A 07/23/2015   Procedure: Coronary Balloon Angioplasty;  Surgeon: Runell Gess, MD;  Location: [facility] INVASIVE CV LAB;  Service: Cardiovascular;  Laterality: N/A;   CARDIAC CATHETERIZATION N/A 07/12/2016   Procedure: Left Heart Cath;  Surgeon: Dalia Heading, MD;  Location: [facility] INV[facility]IVE CV [facility];  Service: Cardiovascular;  Laterality: N/A;   CARDIAC CATHETERIZATION N/A 07/12/2016   Procedure: Left Heart Cath and Coronary Angiography;  Surgeon: Marcina Millard, MD;  Location: [facility] INV[facility]IVE CV [facility];  Service: Cardiovascular;  Laterality: N/A;   CARDIAC CATHETERIZATION N/A 07/12/2016   Procedure: Intravascular Pressure Wire/FFR Study;  Surgeon: Marcina Millard, MD;  Location: [facility] INV[facility]IVE CV [facility];  Service: Cardiovascular;  Laterality: N/A;   CARDIAC CATHETERIZATION N/A 07/12/2016   Procedure: Coronary Stent Intervention;  Surgeon: Marcina Millard, MD;  Location: [facility] INVASIVE CV [facility];  Service: Cardiovascular;  Laterality: N/A;   KNEE SURGERY Right 15 years ago   Family History  Problem Relation Age of Onset   Breast cancer Sister 26   Social History   Socioeconomic History   Marital status: Married    Spouse name: Not on file   Number of children: Not on file   Years of education: Not on file  Highest education level: Not on file  Occupational History   Not on file  Tobacco Use   Smoking status: Never   Smokeless tobacco: Former    Types: Snuff    Quit date: 2014  Vaping Use   Vaping status: Never Used  Substance and Sexual Activity   Alcohol use: No   Drug use: Never   Sexual activity: Yes    Birth control/protection: None  Other Topics Concern   Not on file  Social History Narrative   Not on file   Social Determinants of Health   Financial Resource  Strain: Low Risk  (05/15/2023)   Overall Financial Resource Strain (CARDIA)    Difficulty of Paying Living Expenses: Not hard at all  Food Insecurity: No Food Insecurity (05/15/2023)   Hunger Vital Sign    Worried About Running Out of Food in the Last Year: Never true    Ran Out of Food in the Last Year: Never true  Transportation Needs: No Transportation Needs (05/15/2023)   PRAPARE - Administrator, Civil Service (Medical): No    Lack of Transportation (Non-Medical): No  Physical Activity: Inactive (05/15/2023)   Exercise Vital Sign    Days of Exercise per Week: 0 days    Minutes of Exercise per Session: 0 min  Stress: No Stress Concern Present (05/15/2023)   [employer] of Occupational Health - Occupational Stress Questionnaire    Feeling of Stress : Only a little  Social Connections: Socially Integrated (05/15/2023)   Social Connection and Isolation Panel [NHANES]    Frequency of Communication with Friends and Family: More than three times a week    Frequency of Social Gatherings with Friends and Family: Once a week    Attends Religious Services: More than 4 times per year    Active Member of [employer] or Organizations: Yes    Attends Engineer, structural: More than 4 times per year    Marital Status: Married  Catering manager Violence: Not on file    Medications: Outpatient Medications Prior to Visit  Medication Sig   aspirin EC 81 MG tablet Take 81 mg by mouth daily.   atorvastatin (LIPITOR) 80 MG tablet Take by mouth.   cloNIDine (CATAPRES) 0.1 MG tablet Take 0.1 mg by mouth at bedtime.   losartan (COZAAR) 50 MG tablet TAKE 1 TABLET BY MOUTH EVERY DAY   metoprolol tartrate (LOPRESSOR) 25 MG tablet Take 0.5 tablets (12.5 mg total) by mouth 2 (two) times daily.   nitroGLYCERIN (NITROSTAT) 0.4 MG SL tablet Place under the tongue.   ferrous sulfate 325 (65 FE) MG tablet Take 325 mg by mouth daily. (Patient not taking: Reported on 02/05/2023)   furosemide  (LASIX) 20 MG tablet TAKE 2 TABLETS(40 MG) BY MOUTH DAILY (Patient not taking: Reported on 05/15/2023)   No facility-administered medications prior to visit.    Allergies  Allergen Reactions   Enalapril Swelling    Swelling face    Patient Care Team: Miki Kins, FNP as PCP - General (Family Medicine) Jim Like, RN as Registered Nurse Scarlett Presto, RN (Inactive) as Registered Nurse  Review of Systems  Musculoskeletal:  Positive for gait problem.       Knee pain  All other systems reviewed and are negative.      Objective:    Vitals: BP 120/80   Pulse (!) 55   Ht 5\' 6"  (1.676 m)   Wt 258 lb 6.4 oz (117.2 kg)   LMP  05/19/2015 (LMP Unknown)   SpO2 97%   BMI 41.71 kg/m    Physical Exam Vitals and nursing note reviewed.  Constitutional:      Appearance: Normal appearance. She is normal weight.  HENT:     Head: Normocephalic.  Eyes:     Extraocular Movements: Extraocular movements intact.     Conjunctiva/sclera: Conjunctivae normal.     Pupils: Pupils are equal, round, and reactive to light.  Cardiovascular:     Rate and Rhythm: Normal rate.     Pulses: Normal pulses.  Pulmonary:     Effort: Pulmonary effort is normal.  Musculoskeletal:     Right knee: Bony tenderness and crepitus present. Decreased range of motion.  Neurological:     General: No focal deficit present.     Mental Status: She is alert and oriented to person, place, and time. Mental status is at baseline.  Psychiatric:        Mood and Affect: Mood normal.        Behavior: Behavior normal.        Thought Content: Thought content normal.        Judgment: Judgment normal.      Most recent functional status assessment:    05/15/2023   11:37 AM  In your present state of health, do you have any difficulty performing the following activities:  Hearing? 0  Vision? 0  Difficulty concentrating or making decisions? 0  Walking or climbing stairs? 1  Dressing or bathing? 1  Doing  errands, shopping? 1  Preparing Food and eating ? N  Using the Toilet? N  In the past six months, have you accidently leaked urine? N  Managing your Medications? N  Managing your Finances? N    Most recent fall risk assessment:    05/15/2023   11:47 AM  Fall Risk   Falls in the past year? 1  Number falls in past yr: 0  Injury with Fall? 0  Risk for fall due to : Impaired balance/gait;Impaired mobility;Orthopedic patient;History of fall(s)  Follow up Falls evaluation completed;Education provided;Falls prevention discussed     Most recent depression screenings:    05/15/2023   11:45 AM  PHQ 2/9 Scores  PHQ - 2 Score 0  PHQ- 9 Score 0    Most recent cognitive screening:    05/15/2023   11:45 AM  6CIT Screen  What Year? 0 points  What month? 0 points  What time? 0 points  Count back from 20 0 points  Months in reverse 0 points  Repeat phrase 6 points  Total Score 6 points    Results for orders placed or performed in visit on 05/15/23  Hemoglobin A1c  Result Value Ref Range   Hgb A1c MFr Bld 6.3 (H) 4.8 - 5.6 %   Est. average glucose Bld gHb Est-mCnc 134 mg/dL  Lipid panel  Result Value Ref Range   Cholesterol, Total 163 100 - 199 mg/dL   Triglycerides 81 0 - 149 mg/dL   HDL 63 >81 mg/dL   VLDL Cholesterol Cal 15 5 - 40 mg/dL   LDL Chol Calc (NIH) 85 0 - 99 mg/dL   Chol/HDL Ratio 2.6 0.0 - 4.4 ratio  CBC With Diff/Platelet  Result Value Ref Range   WBC 6.9 3.4 - 10.8 x10E3/uL   RBC 5.32 (H) 3.77 - 5.28 x10E6/uL   Hemoglobin 14.7 11.1 - 15.9 g/dL   Hematocrit 19.1 47.8 - 46.6 %   MCV 87 79 - 97 fL  MCH 27.6 26.6 - 33.0 pg   MCHC 31.7 31.5 - 35.7 g/dL   RDW 40.9 81.1 - 91.4 %   Platelets 215 150 - 450 x10E3/uL   Neutrophils 67 Not Estab. %   Lymphs 25 Not Estab. %   Monocytes 6 Not Estab. %   Eos 2 Not Estab. %   Basos 0 Not Estab. %   Neutrophils Absolute 4.6 1.4 - 7.0 x10E3/uL   Lymphocytes Absolute 1.7 0.7 - 3.1 x10E3/uL   Monocytes Absolute 0.4  0.1 - 0.9 x10E3/uL   EOS (ABSOLUTE) 0.1 0.0 - 0.4 x10E3/uL   Basophils Absolute 0.0 0.0 - 0.2 x10E3/uL   Immature Granulocytes 0 Not Estab. %   Immature Grans (Abs) 0.0 0.0 - 0.1 x10E3/uL  CMP14+EGFR  Result Value Ref Range   Glucose 81 70 - 99 mg/dL   BUN 14 6 - 24 mg/dL   Creatinine, Ser 7.82 0.57 - 1.00 mg/dL   eGFR 71 >95 AO/ZHY/8.65   BUN/Creatinine Ratio 15 9 - 23   Sodium 140 134 - 144 mmol/L   Potassium 4.7 3.5 - 5.2 mmol/L   Chloride 100 96 - 106 mmol/L   CO2 25 20 - 29 mmol/L   Calcium 9.6 8.7 - 10.2 mg/dL   Total Protein 7.4 6.0 - 8.5 g/dL   Albumin 4.3 3.8 - 4.9 g/dL   Globulin, Total 3.1 1.5 - 4.5 g/dL   Bilirubin Total 0.7 0.0 - 1.2 mg/dL   Alkaline Phosphatase 112 44 - 121 IU/L   AST 17 0 - 40 IU/L   ALT 11 0 - 32 IU/L       Assessment & Plan:      Annual wellness visit done today including the all of the following: Reviewed patient's Family Medical History Reviewed and updated list of patient's medical providers Assessment of cognitive impairment was done Assessed patient's functional ability Established a written schedule for health screening services Health Risk Assessent Completed and Reviewed  Exercise Activities and Dietary recommendations  Goals   None     Immunization History  Administered Date(s) Administered   Influenza, Mdck, Trivalent,PF 6+ MOS(egg free) 04/13/2023    Health Maintenance  Topic Date Due   COVID-19 Vaccine (1) Never done   Hepatitis C Screening  Never done   DTaP/Tdap/Td (1 - Tdap) Never done   Zoster Vaccines- Shingrix (1 of 2) Never done   Colonoscopy  Never done   Medicare Annual Wellness (AWV)  05/09/2023   MAMMOGRAM  10/05/2024   Cervical Cancer Screening (HPV/Pap Cotest)  01/09/2028   INFLUENZA VACCINE  Completed   HIV Screening  Completed   HPV VACCINES  Aged Out     Discussed health benefits of physical activity, and encouraged her to engage in regular exercise appropriate for her age and condition.          Miki Kins, FNP   05/15/2023  This document may have been prepared by Dragon Voice Recognition software and as such may include unintentional dictation errors.

## 2023-05-17 ENCOUNTER — Encounter
Admission: RE | Admit: 2023-05-17 | Discharge: 2023-05-17 | Disposition: A | Discharge status: Home / Self Care | Payer: Medicare HMO | Source: Ambulatory Visit | Attending: Cardiology | Admitting: Cardiology

## 2023-05-17 DIAGNOSIS — I2510 Atherosclerotic heart disease of native coronary artery without angina pectoris: Secondary | ICD-10-CM | POA: Diagnosis not present

## 2023-05-17 DIAGNOSIS — Z955 Presence of coronary angioplasty implant and graft: Secondary | ICD-10-CM | POA: Insufficient documentation

## 2023-05-17 DIAGNOSIS — I2119 ST elevation (STEMI) myocardial infarction involving other coronary artery of inferior wall: Secondary | ICD-10-CM | POA: Insufficient documentation

## 2023-05-17 DIAGNOSIS — Z01810 Encounter for preprocedural cardiovascular examination: Secondary | ICD-10-CM | POA: Diagnosis not present

## 2023-05-17 LAB — NM MYOCAR MULTI W/SPECT W/WALL MOTION / EF
Estimated workload: 1
Exercise duration (min): 1 min
LV dias vol: 84 mL (ref 46–106)
LV sys vol: 31 mL
MPHR: 162 {beats}/min
Nuc Stress EF: 63 %
Peak HR: 98 {beats}/min
Percent HR: 60 %
Rest HR: 50 {beats}/min
Rest Nuclear Isotope Dose: 10 mCi
SDS: 0
SRS: 0
SSS: 0
ST Depression (mm): 0 mm
Stress Nuclear Isotope Dose: 31.4 mCi
TID: 1.07

## 2023-05-17 MED ORDER — TECHNETIUM TC 99M TETROFOSMIN IV KIT
30.0000 | PACK | Freq: Once | INTRAVENOUS | Status: AC | PRN
  Administered 2023-05-17: 31.38 via INTRAVENOUS

## 2023-05-17 MED ORDER — REGADENOSON 0.4 MG/5ML IV SOLN
0.4000 mg | Freq: Once | INTRAVENOUS | Status: AC
  Administered 2023-05-17: 0.4 mg via INTRAVENOUS

## 2023-05-17 MED ORDER — TECHNETIUM TC 99M TETROFOSMIN IV KIT
10.0000 | PACK | Freq: Once | INTRAVENOUS | Status: AC | PRN
  Administered 2023-05-17: 10.01 via INTRAVENOUS

## 2023-05-17 NOTE — Progress Notes (Signed)
Had been called by nuclear medicine to contact IV team. Called number, did not get an answer.

## 2023-05-22 ENCOUNTER — Telehealth: Payer: Self-pay | Admitting: Family

## 2023-05-22 ENCOUNTER — Other Ambulatory Visit: Payer: Self-pay | Admitting: Family

## 2023-05-22 NOTE — Telephone Encounter (Signed)
Patient left VM needing her medications sent in. Need to call and see what all medications it is that she is currently needing.

## 2023-05-23 DIAGNOSIS — M1711 Unilateral primary osteoarthritis, right knee: Secondary | ICD-10-CM | POA: Diagnosis not present

## 2023-05-24 DIAGNOSIS — Z955 Presence of coronary angioplasty implant and graft: Secondary | ICD-10-CM | POA: Diagnosis not present

## 2023-05-24 DIAGNOSIS — E785 Hyperlipidemia, unspecified: Secondary | ICD-10-CM | POA: Diagnosis not present

## 2023-05-24 DIAGNOSIS — I10 Essential (primary) hypertension: Secondary | ICD-10-CM | POA: Diagnosis not present

## 2023-05-24 DIAGNOSIS — I2119 ST elevation (STEMI) myocardial infarction involving other coronary artery of inferior wall: Secondary | ICD-10-CM | POA: Diagnosis not present

## 2023-05-24 DIAGNOSIS — I4901 Ventricular fibrillation: Secondary | ICD-10-CM | POA: Diagnosis not present

## 2023-05-25 MED ORDER — ATORVASTATIN CALCIUM 80 MG PO TABS: 80.0000 mg | ORAL_TABLET | Freq: Every day | ORAL | 1 refills | Status: AC

## 2023-05-25 MED ORDER — METOPROLOL TARTRATE 25 MG PO TABS: 12.5000 mg | ORAL_TABLET | Freq: Two times a day (BID) | ORAL | 1 refills | Status: DC

## 2023-05-28 ENCOUNTER — Other Ambulatory Visit: Payer: Self-pay

## 2023-05-28 ENCOUNTER — Encounter
Admission: RE | Admit: 2023-05-28 | Discharge: 2023-05-28 | Disposition: A | Discharge status: Home / Self Care | Payer: Medicare HMO | Source: Ambulatory Visit | Attending: Orthopedic Surgery | Admitting: Orthopedic Surgery

## 2023-05-28 VITALS — BP 118/76 | HR 58 | Resp 18 | Ht 66.0 in | Wt 256.4 lb

## 2023-05-28 DIAGNOSIS — R8290 Unspecified abnormal findings in urine: Secondary | ICD-10-CM | POA: Diagnosis not present

## 2023-05-28 DIAGNOSIS — Z01810 Encounter for preprocedural cardiovascular examination: Secondary | ICD-10-CM | POA: Diagnosis not present

## 2023-05-28 DIAGNOSIS — R8281 Pyuria: Secondary | ICD-10-CM | POA: Diagnosis not present

## 2023-05-28 DIAGNOSIS — Z01818 Encounter for other preprocedural examination: Secondary | ICD-10-CM | POA: Diagnosis not present

## 2023-05-28 DIAGNOSIS — R8271 Bacteriuria: Secondary | ICD-10-CM | POA: Insufficient documentation

## 2023-05-28 DIAGNOSIS — Z01812 Encounter for preprocedural laboratory examination: Secondary | ICD-10-CM

## 2023-05-28 HISTORY — DX: Anemia, unspecified: D64.9

## 2023-05-28 HISTORY — DX: Prediabetes: R73.03

## 2023-05-28 HISTORY — DX: Angina pectoris, unspecified: I20.9

## 2023-05-28 HISTORY — DX: Unilateral primary osteoarthritis, right knee: M17.11

## 2023-05-28 LAB — URINALYSIS, ROUTINE W REFLEX MICROSCOPIC
Bilirubin Urine: NEGATIVE
Glucose, UA: NEGATIVE mg/dL
Hgb urine dipstick: NEGATIVE
Ketones, ur: NEGATIVE mg/dL
Nitrite: NEGATIVE
Protein, ur: NEGATIVE mg/dL
Specific Gravity, Urine: 1.027 (ref 1.005–1.030)
pH: 5 (ref 5.0–8.0)

## 2023-05-28 LAB — SURGICAL PCR SCREEN
MRSA, PCR: NEGATIVE
Staphylococcus aureus: POSITIVE — AB

## 2023-05-28 NOTE — Patient Instructions (Addendum)
Your procedure is scheduled on: 06/07/23 - Thursday Report to the Registration Desk on the 1st floor of the [facility]. To find out your arrival time, please call [phone] between 1PM - 3PM on: 06/06/23 - Wednesday If your arrival time is 6:00 am, do not arrive before that time as the [facility] entrance doors do not open until 6:00 am.  REMEMBER: Instructions that are not followed completely may result in serious medical risk, up to and including death; or upon the discretion of your surgeon and anesthesiologist your surgery may need to be rescheduled.  Do not eat food after midnight the night before surgery.  No gum chewing or hard candies.  You may however, drink CLEAR liquids up to 2 hours before you are scheduled to arrive for your surgery. Do not drink anything within 2 hours of your scheduled arrival time.  Clear liquids include: - water  - apple juice without pulp - gatorade (not RED colors) - black coffee or tea (Do NOT add milk or creamers to the coffee or tea) Do NOT drink anything that is not on this list.  In addition, your doctor has ordered for you to drink the provided:  Ensure Pre-Surgery Clear Carbohydrate Drink  Drinking this carbohydrate drink up to two hours before surgery helps to reduce insulin resistance and improve patient outcomes. Please complete drinking 2 hours before scheduled arrival time.  One week prior to surgery: Stop Anti-inflammatories (NSAIDS) such as Advil, Aleve, Ibuprofen, Motrin, Naproxen, Naprosyn and Aspirin based products such as Excedrin, Goody's Powder, BC Powder. You may take Tylenol if needed for pain up until the day of surgery.  Stop taking 11/28, ANY OVER THE COUNTER supplements until after surgery.VITAMIN D3    Hold losartan (COZAAR) , Aspirin, and furosemide (LASIX) on the morning of surgery.  Hold Plavix per doctor order, will call with stop date.  ON THE DAY OF SURGERY ONLY TAKE THESE MEDICATIONS WITH SIPS OF  WATER:  metoprolol tartrate (LOPRESSOR)    No Alcohol for 24 hours before or after surgery.  No Smoking including e-cigarettes for 24 hours before surgery.  No chewable tobacco products for at least 6 hours before surgery.  No nicotine patches on the day of surgery.  Do not use any "recreational" drugs for at least a week (preferably 2 weeks) before your surgery.  Please be advised that the combination of cocaine and anesthesia may have negative outcomes, up to and including death. If you test positive for cocaine, your surgery will be cancelled.  On the morning of surgery brush your teeth with toothpaste and water, you may rinse your mouth with mouthwash if you wish. Do not swallow any toothpaste or mouthwash.  Use CHG Soap or wipes as directed on instruction sheet.  Do not wear jewelry, make-up, hairpins, clips or nail polish.  For welded (permanent) jewelry: bracelets, anklets, waist bands, etc.  Please have this removed prior to surgery.  If it is not removed, there is a chance that hospital personnel will need to cut it off on the day of surgery.  Do not wear lotions, powders, or perfumes.   Do not shave body hair from the neck down 48 hours before surgery.  Contact lenses, hearing aids and dentures may not be worn into surgery.  Do not bring valuables to the hospital. [facility] is not responsible for any missing/lost belongings or valuables.   Notify your doctor if there is any change in your medical condition (cold, fever, infection).  Wear comfortable clothing (specific to your surgery type) to the hospital.  After surgery, you can help prevent lung complications by doing breathing exercises.  Take deep breaths and cough every 1-2 hours. Your doctor may order a device called an Incentive Spirometer to help you take deep breaths. When coughing or sneezing, hold a pillow firmly against your incision with both hands. This is called "splinting." Doing this helps protect  your incision. It also decreases belly discomfort.  If you are being admitted to the hospital overnight, leave your suitcase in the car. After surgery it may be brought to your room.  In case of increased patient census, it may be necessary for you, the patient, to continue your postoperative care in the Same Day Surgery department.  If you are being discharged the day of surgery, you will not be allowed to drive home. You will need a responsible individual to drive you home and stay with you for 24 hours after surgery.   If you are taking public transportation, you will need to have a responsible individual with you.  Please call the Pre-admissions Testing Dept. at [phone] if you have any questions about these instructions.  Surgery Visitation Policy:  Patients having surgery or a procedure may have two visitors.  Children under the age of 61 must have an adult with them who is not the patient.  Inpatient Visitation:    Visiting hours are 7 a.m. to 8 p.m. Up to four visitors are allowed at one time in a patient room. The visitors may rotate out with other people during the day.  One visitor age 19 or older may stay with the patient overnight and must be in the room by 8 p.m.  Pre-operative 5 CHG Bath Instructions   You can play a key role in reducing the risk of infection after surgery. Your skin needs to be as free of germs as possible. You can reduce the number of germs on your skin by washing with CHG (chlorhexidine gluconate) soap before surgery. CHG is an antiseptic soap that kills germs and continues to kill germs even after washing.   DO NOT use if you have an allergy to chlorhexidine/CHG or antibacterial soaps. If your skin becomes reddened or irritated, stop using the CHG and notify one of our RNs at [phone].   Please shower with the CHG soap starting 4 days before surgery using the following schedule: 12/01 - 12/05.    Please keep in mind the following:  DO  NOT shave, including legs and underarms, starting the day of your first shower.   You may shave your face at any point before/day of surgery.  Place clean sheets on your bed the day you start using CHG soap. Use a clean washcloth (not used since being washed) for each shower. DO NOT sleep with pets once you start using the CHG.   CHG Shower Instructions:  If you choose to wash your hair and private area, wash first with your normal shampoo/soap.  After you use shampoo/soap, rinse your hair and body thoroughly to remove shampoo/soap residue.  Turn the water OFF and apply about 3 tablespoons (45 ml) of CHG soap to a CLEAN washcloth.  Apply CHG soap ONLY FROM YOUR NECK DOWN TO YOUR TOES (washing for 3-5 minutes)  DO NOT use CHG soap on face, private areas, open wounds, or sores.  Pay special attention to the area where your surgery is being performed.  If you are having back surgery, having  someone wash your back for you may be helpful. Wait 2 minutes after CHG soap is applied, then you may rinse off the CHG soap.  Pat dry with a clean towel  Put on clean clothes/pajamas   If you choose to wear lotion, please use ONLY the CHG-compatible lotions on the back of this paper.     Additional instructions for the day of surgery: DO NOT APPLY any lotions, deodorants, cologne, or perfumes.   Put on clean/comfortable clothes.  Brush your teeth.  Ask your nurse before applying any prescription medications to the skin.      CHG Compatible Lotions   Aveeno Moisturizing lotion  Cetaphil Moisturizing Cream  Cetaphil Moisturizing Lotion  Clairol Herbal Essence Moisturizing Lotion, Dry Skin  Clairol Herbal Essence Moisturizing Lotion, Extra Dry Skin  Clairol Herbal Essence Moisturizing Lotion, Normal Skin  Curel Age Defying Therapeutic Moisturizing Lotion with Alpha Hydroxy  Curel Extreme Care Body Lotion  Curel Soothing Hands Moisturizing Hand Lotion  Curel Therapeutic Moisturizing Cream,  Fragrance-Free  Curel Therapeutic Moisturizing Lotion, Fragrance-Free  Curel Therapeutic Moisturizing Lotion, Original Formula  Eucerin Daily Replenishing Lotion  Eucerin Dry Skin Therapy Plus Alpha Hydroxy Crme  Eucerin Dry Skin Therapy Plus Alpha Hydroxy Lotion  Eucerin Original Crme  Eucerin Original Lotion  Eucerin Plus Crme Eucerin Plus Lotion  Eucerin TriLipid Replenishing Lotion  Keri Anti-Bacterial Hand Lotion  Keri Deep Conditioning Original Lotion Dry Skin Formula Softly Scented  Keri Deep Conditioning Original Lotion, Fragrance Free Sensitive Skin Formula  Keri Lotion Fast Absorbing Fragrance Free Sensitive Skin Formula  Keri Lotion Fast Absorbing Softly Scented Dry Skin Formula  Keri Original Lotion  Keri Skin Renewal Lotion Keri Silky Smooth Lotion  Keri Silky Smooth Sensitive Skin Lotion  Nivea Body Creamy Conditioning Oil  Nivea Body Extra Enriched Lotion  Nivea Body Original Lotion  Nivea Body Sheer Moisturizing Lotion Nivea Crme  Nivea Skin Firming Lotion  NutraDerm 30 Skin Lotion  NutraDerm Skin Lotion  NutraDerm Therapeutic Skin Cream  NutraDerm Therapeutic Skin Lotion  ProShield Protective Hand Cream  Provon moisturizing lotion   How to Use an Incentive Spirometer  An incentive spirometer is a tool that measures how well you are filling your lungs with each breath. Learning to take long, deep breaths using this tool can help you keep your lungs clear and active. This may help to reverse or lessen your chance of developing breathing (pulmonary) problems, especially infection. You may be asked to use a spirometer: After a surgery. If you have a lung problem or a history of smoking. After a long period of time when you have been unable to move or be active. If the spirometer includes an indicator to show the highest number that you have reached, your health care provider or respiratory therapist will help you set a goal. Keep a log of your progress as told by  your health care provider. What are the risks? Breathing too quickly may cause dizziness or cause you to pass out. Take your time so you do not get dizzy or light-headed. If you are in pain, you may need to take pain medicine before doing incentive spirometry. It is harder to take a deep breath if you are having pain. How to use your incentive spirometer  Sit up on the edge of your bed or on a chair. Hold the incentive spirometer so that it is in an upright position. Before you use the spirometer, breathe out normally. Place the mouthpiece in your mouth. Make sure  your lips are closed tightly around it. Breathe in slowly and as deeply as you can through your mouth, causing the piston or the ball to rise toward the top of the chamber. Hold your breath for 3-5 seconds, or for as long as possible. If the spirometer includes a coach indicator, use this to guide you in breathing. Slow down your breathing if the indicator goes above the marked areas. Remove the mouthpiece from your mouth and breathe out normally. The piston or ball will return to the bottom of the chamber. Rest for a few seconds, then repeat the steps 10 or more times. Take your time and take a few normal breaths between deep breaths so that you do not get dizzy or light-headed. Do this every 1-2 hours when you are awake. If the spirometer includes a goal marker to show the highest number you have reached (best effort), use this as a goal to work toward during each repetition. After each set of 10 deep breaths, cough a few times. This will help to make sure that your lungs are clear. If you have an incision on your chest or abdomen from surgery, place a pillow or a rolled-up towel firmly against the incision when you cough. This can help to reduce pain while taking deep breaths and coughing. General tips When you are able to get out of bed: Walk around often. Continue to take deep breaths and cough in order to clear your  lungs. Keep using the incentive spirometer until your health care provider says it is okay to stop using it. If you have been in the hospital, you may be told to keep using the spirometer at home. Contact a health care provider if: You are having difficulty using the spirometer. You have trouble using the spirometer as often as instructed. Your pain medicine is not giving enough relief for you to use the spirometer as told. You have a fever. Get help right away if: You develop shortness of breath. You develop a cough with bloody mucus from the lungs. You have fluid or blood coming from an incision site after you cough. Summary An incentive spirometer is a tool that can help you learn to take long, deep breaths to keep your lungs clear and active. You may be asked to use a spirometer after a surgery, if you have a lung problem or a history of smoking, or if you have been inactive for a long period of time. Use your incentive spirometer as instructed every 1-2 hours while you are awake. If you have an incision on your chest or abdomen, place a pillow or a rolled-up towel firmly against your incision when you cough. This will help to reduce pain. Get help right away if you have shortness of breath, you cough up bloody mucus, or blood comes from your incision when you cough. This information is not intended to replace advice given to you by your health care provider. Make sure you discuss any questions you have with your health care provider. Document Revised: 09/08/2019 Document Reviewed: 09/08/2019 Elsevier Patient Education  2023 Elsevier Inc.    POLAR CARE INFORMATION  MassAdvertisement.it  How to use [facility] Therapy System?  YouTube   ShippingScam.co.uk  OPERATING INSTRUCTIONS  Start the product With dry hands, connect the transformer to the electrical connection located on the top of the cooler. Next, plug the transformer into an appropriate  electrical outlet. The unit will automatically start running at this point.  To  stop the pump, disconnect electrical power.  Unplug to stop the product when not in use. Unplugging the Polar Care unit turns it off. Always unplug immediately after use. Never leave it plugged in while unattended. Remove pad.    FIRST ADD WATER TO FILL LINE, THEN ICE---Replace ice when existing ice is almost melted  1 Discuss Treatment with your Licensed Health Care Practitioner and Use Only as Prescribed 2 Apply Insulation Barrier & Cold Therapy Pad 3 Check for Moisture 4 Inspect Skin Regularly  Tips and Trouble Shooting Usage Tips 1. Use cubed or chunked ice for optimal performance. 2. It is recommended to drain the Pad between uses. To drain the pad, hold the Pad upright with the hose pointed toward the ground. Depress the black plunger and allow water to drain out. 3. You may disconnect the Pad from the unit without removing the pad from the affected area by depressing the silver tabs on the hose coupling and gently pulling the hoses apart. The Pad and unit will seal itself and will not leak. Note: Some dripping during release is normal. 4. DO NOT RUN PUMP WITHOUT WATER! The pump in this unit is designed to run with water. Running the unit without water will cause permanent damage to the pump. 5. Unplug unit before removing lid.  TROUBLESHOOTING GUIDE Pump not running, Water not flowing to the pad, Pad is not getting cold 1. Make sure the transformer is plugged into the wall outlet. 2. Confirm that the ice and water are filled to the indicated levels. 3. Make sure there are no kinks in the pad. 4. Gently pull on the blue tube to make sure the tube/pad junction is straight. 5. Remove the pad from the treatment site and ll it while the pad is lying at; then reapply. 6. Confirm that the pad couplings are securely attached to the unit. Listen for the double clicks (Figure 1) to confirm the pad couplings are  securely attached.  Leaks    Note: Some condensation on the lines, controller, and pads is unavoidable, especially in warmer climates. 1. If using a [employer] Polar Care Cold Therapy unit with a detachable Cold Therapy Pad, and a leak exists (other than condensation on the lines) disconnect the pad couplings. Make sure the silver tabs on the couplings are depressed before reconnecting the pad to the pump hose; then confirm both sides of the coupling are properly clicked in. 2. If the coupling continues to leak or a leak is detected in the pad itself, stop using it and call [employer] Customer Care at [phone].  Cleaning After use, empty and dry the unit with a soft cloth. Warm water and mild detergent may be used occasionally to clean the pump and tubes.  WARNING: The Polar Care Cube can be cold enough to cause serious injury, including full skin necrosis. Follow these Operating Instructions, and carefully read the Product Insert (see pouch on side of unit) and the Cold Therapy Pad Fitting Instructions (provided with each Cold Therapy Pad) prior to use.   Preoperative Educational Videos for Total Hip, Knee and Shoulder Replacements  To better prepare for surgery, please view our videos that explain the physical activity and discharge planning required to have the best surgical recovery at [facility].  [email or web address]  Questions? Call [phone] or email [email or web address]

## 2023-05-30 ENCOUNTER — Telehealth: Payer: Self-pay | Admitting: Urgent Care

## 2023-05-30 DIAGNOSIS — Z01812 Encounter for preprocedural laboratory examination: Secondary | ICD-10-CM

## 2023-05-30 DIAGNOSIS — B951 Streptococcus, group B, as the cause of diseases classified elsewhere: Secondary | ICD-10-CM

## 2023-05-30 DIAGNOSIS — B3731 Acute candidiasis of vulva and vagina: Secondary | ICD-10-CM

## 2023-05-30 LAB — URINE CULTURE: Culture: >=100000 — AB

## 2023-05-30 MED ORDER — AMOXICILLIN 875 MG PO TABS: 875.0000 mg | ORAL_TABLET | Freq: Two times a day (BID) | ORAL | 0 refills | Status: AC

## 2023-05-30 MED ORDER — FLUCONAZOLE 150 MG PO TABS: ORAL_TABLET | ORAL | 0 refills | Status: DC

## 2023-05-30 NOTE — Progress Notes (Signed)
[facility] Perioperative Services: Pre-Admission/Anesthesia Testing  Abnormal Lab Notification and Treatment Plan of Care   Date: 05/30/23  Name: Sarah Medina MRN:   [id number]  Re: Abnormal labs noted during PAT appointment   Notified:  Provider Name Provider Role Notification Mode  Reinaldo Berber, MD Orthopedics (Surgeon) Routed and/or faxed via [facility]   Abnormal Lab Value(s):   Lab Results  Component Value Date   COLORURINE YELLOW (A) 05/28/2023   APPEARANCEUR HAZY (A) 05/28/2023   LABSPEC 1.027 05/28/2023   PHURINE 5.0 05/28/2023   GLUCOSEU NEGATIVE 05/28/2023   HGBUR NEGATIVE 05/28/2023   BILIRUBINUR NEGATIVE 05/28/2023   KETONESUR NEGATIVE 05/28/2023   PROTEINUR NEGATIVE 05/28/2023   NITRITE NEGATIVE 05/28/2023   LEUKOCYTESUR LARGE (A) 05/28/2023   EPIU 11-20 05/28/2023   WBCU 11-20 05/28/2023   RBCU 6-10 05/28/2023   BACTERIA FEW (A) 05/28/2023   CULT (A) 05/28/2023    >=100,000 COLONIES/mL GROUP B STREP(S.AGALACTIAE)ISOLATED TESTING AGAINST S. AGALACTIAE NOT ROUTINELY PERFORMED DUE TO PREDICTABILITY OF AMP/PEN/VAN SUSCEPTIBILITY. Performed at [facility], [address] [place], [place] [address]    Clinical Information and Notes:  Patient is scheduled for RIGHT TOTAL KNEE ARTHROPLASTY on 06/07/2023  UA performed in PAT consistent with/concerning for infection.  No leukocytosis noted on CBC; WBC 6.9 Renal function: Estimated Creatinine Clearance: 85.5 mL/min (by C-G formula based on SCr of 0.93 mg/dL). Urine C&S added to assess for pathogenically significant growth.  Impression and Plan:  Sarah Medina with a UA that was (+) for infection; reflex culture sent. (+) GBS isolated. Contacted patient to discuss. Patient reporting that she is experiencing minor LUTS and significant vaginal itching; denies discharge. Patient with surgery scheduled soon. In efforts to avoid delaying patient's procedure, or have her experience any  potentially significant perioperative complications related to the aforementioned, I would like to proceed with treatment for urinary tract infection.  Allergies reviewed. Culture report also reviewed to ensure culture appropriate coverage is being provided. Will treat with a 5 day course of AMOXICILLIN. Patient encouraged to complete the entire course of antibiotics even if she begins to feel better. Concern for concurrent VVC given her reported pruritus. Will send in prophylactic treatment for this as well.  Meds ordered this encounter  Medications   amoxicillin (AMOXIL) 875 MG tablet    Sig: Take 1 tablet (875 mg total) by mouth 2 (two) times daily for 5 days. Increase water intake while taking this medication.    Dispense:  10 tablet    Refill:  0   fluconazole (DIFLUCAN) 150 MG tablet    Sig: Take 1 tablet (150 mg) PO x 1 dose. May repeat 150 mg dose in 3 days if still symptomatic.    Dispense:  2 tablet    Refill:  0   Patient encouraged to increase her fluid intake as much as possible. Discussed that water is always best to flush the urinary tract. She was advised to avoid caffeine containing fluids until her infections clears, as caffeine can cause her to experience painful bladder spasms.   May use Tylenol as needed for pain/fever should she experience these symptoms.   Patient instructed to call surgeon's office or PAT with any questions or concerns related to the above outlined course of treatment. Additionally, she was instructed to call if she feels like she is getting worse overall while on treatment. Results and treatment plan of care forwarded to primary attending surgeon to make them aware.   Encounter  Diagnoses  Name Primary?   Pre-operative laboratory examination Yes   Group B streptococcal UTI    Vulvovaginal candidiasis    Quentin Mulling, MSN, APRN, FNP-C, CEN [facility]  Perioperative Services Nurse Practitioner Phone: [phone] Fax: [phone] 05/30/23 1:21 PM  NOTE: This note has been prepared using Scientist, clinical (histocompatibility and immunogenetics). Despite my best ability to proofread, there is always the potential that unintentional transcriptional errors may still occur from this process.

## 2023-06-04 ENCOUNTER — Encounter: Payer: Self-pay | Admitting: Orthopedic Surgery

## 2023-06-04 NOTE — Progress Notes (Signed)
Perioperative / Anesthesia Services  Pre-Admission Testing Clinical Review / Pre-Operative Anesthesia Consult  Date: 06/04/23  Patient Demographics:  Name: Sarah Medina DOB:   June 12, 1966 MRN:   [id number]  Planned Surgical Procedure(s):    Case: [id number] Date/Time: 06/07/23 1008   Procedure: TOTAL KNEE ARTHROPLASTY (Right: Knee)   Anesthesia type: Choice   Pre-op diagnosis: Primary osteoarthritis of right knee M17.11   Location: [facility] OR ROOM 01 / [facility]   Surgeons: Reinaldo Berber, MD     NOTE: Available PAT nursing documentation and vital signs have been reviewed. Clinical nursing staff has updated patient's PMH/PSHx, current medication list, and drug allergies/intolerances to ensure comprehensive history available to assist in medical decision making as it pertains to the aforementioned surgical procedure and anticipated anesthetic course. Extensive review of available clinical information personally performed.  PMH and PSHx updated with any diagnoses/procedures that  may have been inadvertently omitted during her intake with the pre-admission testing department's nursing staff.  Clinical Discussion:  Sarah Medina is a 57 y.o. female who is submitted for pre-surgical anesthesia review and clearance prior to her undergoing the above procedure. Patient has never been a smoker. Pertinent PMH includes: CAD, inferior STEMI x 2, ventricular fibrillation cardiac arrest, carotid artery disease, angina, HTN, HLD, prediabetes, anemia, seizures, OA.  Patient is followed by cardiology Melton Alar, MD). She was last seen in the cardiology clinic on 05/24/2023; notes reviewed. At the time of her clinic visit, patient doing well overall from a cardiovascular perspective. Patient denied any chest pain, shortness of breath, PND, orthopnea, palpitations, significant peripheral edema, weakness, fatigue, vertiginous symptoms, or presyncope/syncope. Patient with a past  medical history significant for cardiovascular diagnoses. Documented physical exam was grossly benign, providing no evidence of acute exacerbation and/or decompensation of the patient's known cardiovascular conditions.  Patient suffered an inferior wall STEMI on 07/20/2015.  Diagnostic LEFT heart catheterization was performed revealing multivessel CAD; 50% distal LCx and 100% proximal RCA.  PCI was subsequently performed placing a 2.25 x 23 mm Xience Alpine DES x 1 to the proximal RCA lesion.  Procedure yielded excellent angiographic result and TIMI-3 flow.  Two days following her initial STEMI, patient demonstrated signs of a second inferior STEMI.  Patient was taken back to the cardiac catheterization lab.1 patient was transferred from the stretcher to the catheterization lab table, patient experienced ventricular fibrillation cardiac arrest.  She required defibrillation's x 4.  Patient overall hemodynamically unstable with SBP in the 70s.  She was started on amiodarone and dopamine drips.  Repeat cardiac catheterization revealed 80% stenosis of the proximal LAD and 100% thrombotic occlusion of the previously placed proximal RCA stent.  PTCA was performed to the proximal RCA restoring TIMI-3 flow.  Given patient's instability, further intervention was deferred at that time.  Plans were for staged PCI of the LAD lesion at a later date.  Diagnostic LEFT heart catheterization was performed on 07/12/2016 revealing multivessel CAD; 50% LM, 70% mid RCA, and 80% proximal LAD.  Previously placed stent to the proximal RCA was widely patent.  PCI was subsequently performed placing a 3.25 x 18 mm Xience Alpine DES x 1 to the proximal LAD lesion.  Procedure yielded excellent angiographic result and TIMI-3 flow.  Most recent TTE was performed on 03/09/2019 revealing a normal left ventricular systolic function with an EF of 55 to 60%.  There were no regional wall motion abnormalities.  Right ventricular size and  function normal.  There was no significant valvular  regurgitation. All transvalvular gradients were noted to be normal providing no evidence suggestive of valvular stenosis. Aorta normal in size with no evidence of aneurysmal dilatation.  Most recent myocardial perfusion imaging study was performed on 05/17/2023 revealing a normal left ventricular systolic function with an EF of 63%.  End systolic and diastolic cavity size noted to be normal.  Patient able to achieve 60% of her MPHR (max heart rate 98 bpm).  Peak METS achieved was 1.0.  There was no evidence of stress-induced myocardial ischemia or arrhythmia; no scintigraphic evidence of scar.  Patient reported fatigue and shortness of breath during the stress test.  Overall, study determined to be low risk.  Following previous thrombotic occlusion of cardiac stent, patient remains on daily DAPT therapy using ASA + clopidogrel.  Patient is reported to be compliant with therapy with no evidence or reports of GI bleeding. Blood pressure well controlled at 100/60 mmHg on currently prescribed diuretic (furosemide) + ARB (losartan) + beta-blocker (metoprolol tartrate) therapies.  Patient is on atorvastatin for her HLD diagnosis and ASCVD prevention.  Patient has a supply of short acting nitrates (NTG) to use on a as needed basis for recurrent angina/anginal equivalent symptoms; denied recent use.  Patient has a prediabetes diagnosis.  Her last hemoglobin A1c was 6.3% when checked on 05/05/2023. Patient is able to complete all of her  ADL/IADLs without cardiovascular limitation.  Per the DASI, patient is able to achieve at least 4 METS of physical activity without experiencing any significant degree of angina/anginal equivalent symptoms. No changes were made to her medication regimen during her visit with cardiology.  Patient scheduled to follow-up with outpatient cardiology in 6 months or sooner if needed.  Sarah Medina is scheduled for an elective TOTAL KNEE  ARTHROPLASTY (Right: Knee) on 06/07/2023 with Dr. Reinaldo Berber, MD.  Given patient's past medical history significant for cardiovascular diagnoses, presurgical cardiac clearance was sought by the PAT team. Per cardiology, "this patient is optimized for surgery and may proceed with the planned procedural course with a MODERATE risk of significant perioperative cardiovascular complications".  Again, this patient is on daily DAPT therapy.  She has been instructed on recommendations from her cardiologist for holding both her ASA and clopidogrel doses for 5 days prior to her procedure with plans to restart as soon as postoperative bleeding risk to be minimized by her primary attending surgeon.  The patient is aware that the last dose of her DAPT medications should be on 06/02/2023  Patient denies previous perioperative complications with anesthesia in the past. In review of the available records, it is noted that patient underwent a general anesthetic course here at [facility] ([facility]) in 04/2018 without documented complications.      05/28/2023    8:12 AM 05/15/2023   11:08 AM 04/13/2023    9:07 AM  Vitals with BMI  Height 5\' 6"  5\' 6"  5\' 6"   Weight 256 lbs 6 oz 258 lbs 6 oz 263 lbs 6 oz  BMI 41.4 41.73 42.53  Systolic 118 120 161  Diastolic 76 80 82  Pulse 58 55 50    Providers/Specialists:   NOTE: Primary physician provider listed below. Patient may have been seen by APP or partner within same practice.   PROVIDER ROLE / SPECIALTY LAST Wilber Bihari, MD Orthopedics (Surgeon)  05/23/2023  Miki Kins, FNP Primary Care Provider  05/15/2023  Clotilde Dieter, DO Cardiology  05/30/2023   Allergies:  Enalapril  Current  Home Medications:   No current facility-administered medications for this encounter.    aspirin EC 81 MG tablet   cholecalciferol (VITAMIN D3) 25 MCG (1000 UNIT) tablet   ferrous sulfate 325 (65 FE) MG tablet    nitroGLYCERIN (NITROSTAT) 0.4 MG SL tablet   acetaminophen (TYLENOL) 500 MG tablet   amoxicillin (AMOXIL) 875 MG tablet   atorvastatin (LIPITOR) 80 MG tablet   clopidogrel (PLAVIX) 75 MG tablet   fluconazole (DIFLUCAN) 150 MG tablet   furosemide (LASIX) 20 MG tablet   losartan (COZAAR) 50 MG tablet   metoprolol tartrate (LOPRESSOR) 25 MG tablet   History:   Past Medical History:  Diagnosis Date   Anemia    Anginal pain (HCC)    Arthritis    CAD in native artery 07/20/2015   a.) LHC/PCI: 50% dLCx, 100% pRCA (2.25 x 23 mm Xience Alpine DES); b.) LHC/PTCA 07/23/2015: 80% pLAD, 100% thrombotic occlusion of pRCA stent (PTCA). Due to hemodynamic instability, PCI of LAD planed for later date; c.) LHC/PCI 07/12/2016: 50% LM, 70% mRCA, 80% pLAD (3.25 x 18 mm Xience Alpine DES); d.) MV 09/20/2018: no ischemia; e.) MV 02/08/2021: no ischemia; f.) MV 05/17/2023: no ischemia   Cardiac arrest with ventricular fibrillation (HCC) 07/23/2015   a.) developed in setting of inferior STEMI when patient was transferred from stretcher to cath lab table --> required defibrillation x 4. (+) hemodynamic instability with SBPs in the 70s. Started on dopamine and amiodarone. Plans for further staged PCI at a later date.   Carotid artery occlusion    Cellulitis of left lower extremity    Hypercholesteremia    Hypertension    Long term current use of clopidogrel    Long-term use of aspirin therapy    Periorbital edema    treated with steroids   Pre-diabetes    Seizures (HCC)    as teenager, ages [age].  none since   Sepsis (HCC) 12/25/2018   ST elevation myocardial infarction (STEMI) of inferior wall (HCC) 07/23/2015   a.) c/b V.fib arrest requiring defibrillation x 4; b.) relook LHC/PTCA: 80% pLAD, 100% thrombotic occlusion pRCA (PTCA performed) --> plans for staged PCI of LAD due to hemodynamic instability   ST elevation myocardial infarction (STEMI) of inferior wall (HCC) 07/20/2015   a.) LHC/PCI 07/20/2015:  50% dLCx, 100% pRCA (2.25 x 23 mm Xience Alpine DES)   Umbilical hernia    Uterine fibroid    Past Surgical History:  Procedure Laterality Date   CARDIAC CATHETERIZATION N/A 07/20/2015   Procedure: Left Heart Cath and Coronary Angiography;  Surgeon: Marcina Millard, MD;  Location: [facility] INVASIVE CV LAB;  Service: Cardiovascular;  Laterality: N/A;   CARDIAC CATHETERIZATION N/A 07/20/2015   Procedure: Coronary Stent Intervention;  Surgeon: Marcina Millard, MD;  Location: [facility] INVASIVE CV LAB;  Service: Cardiovascular;  Laterality: N/A;   CARDIAC CATHETERIZATION N/A 07/23/2015   Procedure: Left Heart Cath and Coronary Angiography;  Surgeon: Runell Gess, MD;  Location: [facility] INVASIVE CV LAB;  Service: Cardiovascular;  Laterality: N/A;   CARDIAC CATHETERIZATION N/A 07/23/2015   Procedure: Coronary Balloon Angioplasty;  Surgeon: Runell Gess, MD;  Location: [facility] INVASIVE CV LAB;  Service: Cardiovascular;  Laterality: N/A;   CARDIAC CATHETERIZATION N/A 07/12/2016   Procedure: Left Heart Cath;  Surgeon: Dalia Heading, MD;  Location: [facility] INVASIVE CV LAB;  Service: Cardiovascular;  Laterality: N/A;   CARDIAC CATHETERIZATION N/A 07/12/2016   Procedure: Left Heart Cath and Coronary Angiography;  Surgeon: Marcina Millard, MD;  Location:  [facility] INVASIVE CV LAB;  Service: Cardiovascular;  Laterality: N/A;   CARDIAC CATHETERIZATION N/A 07/12/2016   Procedure: Intravascular Pressure Wire/FFR Study;  Surgeon: Marcina Millard, MD;  Location: [facility] INVASIVE CV LAB;  Service: Cardiovascular;  Laterality: N/A;   CARDIAC CATHETERIZATION N/A 07/12/2016   Procedure: Coronary Stent Intervention;  Surgeon: Marcina Millard, MD;  Location: [facility] INVASIVE CV LAB;  Service: Cardiovascular;  Laterality: N/A;   KNEE SURGERY Right 15 years ago   Family History  Problem Relation Age of Onset   Breast cancer Sister 72   Social History   Tobacco Use   Smoking status: Never   Smokeless tobacco: Former     Types: Snuff    Quit date: 2014  Vaping Use   Vaping status: Never Used  Substance Use Topics   Alcohol use: No   Drug use: Never    Pertinent Clinical Results:  LABS:   Lab Results  Component Value Date   WBC 6.9 05/15/2023   HGB 14.7 05/15/2023   HCT 46.3 05/15/2023   MCV 87 05/15/2023   PLT 215 05/15/2023   Lab Results  Component Value Date   NA 140 05/15/2023   K 4.7 05/15/2023   CO2 25 05/15/2023   GLUCOSE 81 05/15/2023   BUN 14 05/15/2023   CREATININE 0.93 05/15/2023   CALCIUM 9.6 05/15/2023   EGFR 71 05/15/2023   GFRNONAA >60 04/04/2023   Component Date Value Ref Range Status   MRSA, PCR 05/28/2023 NEGATIVE  NEGATIVE Final   Staphylococcus aureus 05/28/2023 POSITIVE (A)  NEGATIVE Final   Comment: (NOTE) The Xpert SA Assay (FDA approved for NASAL specimens in patients 74 years of age and older), is one component of a comprehensive surveillance program. It is not intended to diagnose infection nor to guide or monitor treatment. Performed at [facility], [address] [place], [place] [address]    Color, Urine 05/28/2023 YELLOW (A)  YELLOW Final   APPearance 05/28/2023 HAZY (A)  CLEAR Final   Specific Gravity, Urine 05/28/2023 1.027  1.005 - 1.030 Final   pH 05/28/2023 5.0  5.0 - 8.0 Final   Glucose, UA 05/28/2023 NEGATIVE  NEGATIVE mg/dL Final   Hgb urine dipstick 05/28/2023 NEGATIVE  NEGATIVE Final   Bilirubin Urine 05/28/2023 NEGATIVE  NEGATIVE Final   Ketones, ur 05/28/2023 NEGATIVE  NEGATIVE mg/dL Final   Protein, ur 60/45/4098 NEGATIVE  NEGATIVE mg/dL Final   Nitrite 11/91/4782 NEGATIVE  NEGATIVE Final   Leukocytes,Ua 05/28/2023 LARGE (A)  NEGATIVE Final   RBC / HPF 05/28/2023 6-10  0 - 5 RBC/hpf Final   WBC, UA 05/28/2023 11-20  0 - 5 WBC/hpf Final   Bacteria, UA 05/28/2023 FEW (A)  NONE SEEN Final   Squamous Epithelial / HPF 05/28/2023 11-20  0 - 5 /HPF Final   Mucus 05/28/2023 PRESENT   Final   Performed at [facility],  [address] [place], [place] [address]    ECG: Date: 05/28/2023 Time ECG obtained: 0906 AM Rate: 53 bpm Rhythm: sinus bradycardia Axis (leads I and aVF): Normal Intervals: PR 136 ms. QRS 106 ms. QTc 390 ms. ST segment and T wave changes: Inferolateral ST elevation (felt to be secondary to early repolarization. Evidence of a possible age undetermined anterior infarct present. Comparison: Similar to previous tracing obtained on 01/23/2023   IMAGING / PROCEDURES: DIAGNOSTIC RADIOGRAPHS OF RIGHT KNEE  performed on 04/11/2023 Increased valgus deformity with bone-on-bone to the lateral compartment space.  There is osteophytes noted in the tricompartmental  fashion.  Subchondral sclerosis is noted to the lateral compartment.  There is significant degenerative changes to the patellofemoral articulation as well.  The patella is tracking well.   MYOCARDIAL PERFUSION IMAGING STUDY (LEXISCAN) performed on 05/17/2023 Normal left ventricular systolic function with a normal LVEF of 63% Normal myocardial thickening and wall motion Left ventricular cavity size normal SPECT images demonstrate homogenous tracer distribution throughout the myocardium No evidence of stress-induced myocardial ischemia or arrhythmia Maximum HR of 98 bpm; MPHR 60.0%.  Peak METS 1.0.  Normal low risk study  TRANSTHORACIC ECHOCARDIOGRAM performed on 03/09/2019 The left ventricle has normal systolic function, with an ejection fraction of 55-60%. The cavity size was normal. Left ventricular diastolic parameters were normal. No evidence of left ventricular regional wall motion abnormalities.  The right ventricle has normal systolic function. The cavity was normal. There is no increase in right ventricular wall thickness.  The aortic valve is grossly normal.  The aorta is normal unless otherwise noted.   LEFT HEART CATHETERIZATION AND CORONARY ANGIOGRAPHY performed on 07/12/2016 Two-vessel coronary artery disease with  80% stenosis mid LAD, patent stent proximal RCA, with diffuse 70% stenosis small caliber dominant mid RCA. Normal left ventricular function Positive FFR of mid LAD stenosis Successful DES mid LAD -- 3.25 x 18 mm Xience Alpine DES    Impression and Plan:  ATHEENA FRAKES has been referred for pre-anesthesia review and clearance prior to her undergoing the planned anesthetic and procedural courses. Available labs, pertinent testing, and imaging results were personally reviewed by me in preparation for upcoming operative/procedural course. [facility] medical record has been updated following extensive record review and patient interview with PAT staff.   This patient has been appropriately cleared by cardiology with an overall MODERATE risk of experiencing significant perioperative cardiovascular complications. Based on clinical review performed today (06/04/23), barring any significant acute changes in the patient's overall condition, it is anticipated that she will be able to proceed with the planned surgical intervention. Any acute changes in clinical condition may necessitate her procedure being postponed and/or cancelled. Patient will meet with anesthesia team (MD and/or CRNA) on the day of her procedure for preoperative evaluation/assessment. Questions regarding anesthetic course will be fielded at that time.   Pre-surgical instructions were reviewed with the patient during her PAT appointment, and questions were fielded to satisfaction by PAT clinical staff. She has been instructed on which medications that she will need to hold prior to surgery, as well as the ones that have been deemed safe/appropriate to take on the day of her procedure. As part of the general education provided by PAT, patient made aware both verbally and in writing, that she would need to abstain from the use of any illegal substances during her perioperative course.  She was advised that failure to follow the provided  instructions could necessitate case cancellation or result in serious perioperative complications up to and including death. Patient encouraged to contact PAT and/or her surgeon's office to discuss any questions or concerns that may arise prior to surgery; verbalized understanding.   Quentin Mulling, MSN, APRN, FNP-C, CEN [facility]  Perioperative Services Nurse Practitioner Phone: [phone] Fax: [phone] 06/04/23 5:21 PM  NOTE: This note has been prepared using Dragon dictation software. Despite my best ability to proofread, there is always the potential that unintentional transcriptional errors may still occur from this process.

## 2023-06-06 MED ORDER — ORAL CARE MOUTH RINSE: 15.0000 mL | Freq: Once | OROMUCOSAL | Status: AC

## 2023-06-06 MED ORDER — CEFAZOLIN SODIUM-DEXTROSE 2-4 GM/100ML-% IV SOLN
2.0000 g | INTRAVENOUS | Status: AC
  Administered 2023-06-07: 2 g via INTRAVENOUS

## 2023-06-06 MED ORDER — TRANEXAMIC ACID-NACL 1000-0.7 MG/100ML-% IV SOLN
1000.0000 mg | INTRAVENOUS | Status: AC
  Administered 2023-06-07 (×2): 1000 mg via INTRAVENOUS

## 2023-06-06 MED ORDER — CHLORHEXIDINE GLUCONATE 0.12 % MT SOLN
15.0000 mL | Freq: Once | OROMUCOSAL | Status: AC
  Administered 2023-06-07: 15 mL via OROMUCOSAL

## 2023-06-06 MED ORDER — DEXAMETHASONE SODIUM PHOSPHATE 10 MG/ML IJ SOLN: 8.0000 mg | Freq: Once | INTRAMUSCULAR | Status: DC

## 2023-06-07 ENCOUNTER — Encounter
Admission: RE | Disposition: A | Discharge status: Home Health | Payer: Self-pay | Source: Ambulatory Visit | Attending: Orthopedic Surgery

## 2023-06-07 ENCOUNTER — Observation Stay
Admission: RE | Admit: 2023-06-07 | Discharge: 2023-06-09 | Disposition: A | Discharge status: Home Health | Payer: Medicare HMO | Source: Ambulatory Visit | Attending: Orthopedic Surgery | Admitting: Orthopedic Surgery

## 2023-06-07 ENCOUNTER — Other Ambulatory Visit: Payer: Self-pay

## 2023-06-07 ENCOUNTER — Ambulatory Visit: Payer: Medicare HMO | Admitting: Urgent Care

## 2023-06-07 ENCOUNTER — Ambulatory Visit: Payer: Medicare HMO

## 2023-06-07 ENCOUNTER — Encounter: Payer: Self-pay | Admitting: Orthopedic Surgery

## 2023-06-07 DIAGNOSIS — I25119 Atherosclerotic heart disease of native coronary artery with unspecified angina pectoris: Secondary | ICD-10-CM | POA: Diagnosis not present

## 2023-06-07 DIAGNOSIS — R8271 Bacteriuria: Secondary | ICD-10-CM

## 2023-06-07 DIAGNOSIS — R8290 Unspecified abnormal findings in urine: Principal | ICD-10-CM

## 2023-06-07 DIAGNOSIS — I10 Essential (primary) hypertension: Secondary | ICD-10-CM | POA: Diagnosis not present

## 2023-06-07 DIAGNOSIS — Z79899 Other long term (current) drug therapy: Secondary | ICD-10-CM | POA: Insufficient documentation

## 2023-06-07 DIAGNOSIS — I2510 Atherosclerotic heart disease of native coronary artery without angina pectoris: Secondary | ICD-10-CM | POA: Insufficient documentation

## 2023-06-07 DIAGNOSIS — Z7902 Long term (current) use of antithrombotics/antiplatelets: Secondary | ICD-10-CM | POA: Insufficient documentation

## 2023-06-07 DIAGNOSIS — Z96651 Presence of right artificial knee joint: Secondary | ICD-10-CM

## 2023-06-07 DIAGNOSIS — Z01812 Encounter for preprocedural laboratory examination: Secondary | ICD-10-CM

## 2023-06-07 DIAGNOSIS — I119 Hypertensive heart disease without heart failure: Secondary | ICD-10-CM | POA: Diagnosis not present

## 2023-06-07 DIAGNOSIS — Z7982 Long term (current) use of aspirin: Secondary | ICD-10-CM | POA: Insufficient documentation

## 2023-06-07 DIAGNOSIS — M1711 Unilateral primary osteoarthritis, right knee: Secondary | ICD-10-CM | POA: Diagnosis not present

## 2023-06-07 HISTORY — DX: Leiomyoma of uterus, unspecified: D25.9

## 2023-06-07 HISTORY — DX: Umbilical hernia without obstruction or gangrene: K42.9

## 2023-06-07 HISTORY — PX: TOTAL KNEE ARTHROPLASTY: SHX125

## 2023-06-07 HISTORY — DX: Long term (current) use of antithrombotics/antiplatelets: Z79.02

## 2023-06-07 HISTORY — DX: Long term (current) use of aspirin: Z79.82

## 2023-06-07 SURGERY — ARTHROPLASTY, KNEE, TOTAL
Anesthesia: General | Site: Knee | Laterality: Right

## 2023-06-07 MED ORDER — KETOROLAC TROMETHAMINE 30 MG/ML IJ SOLN
INTRAMUSCULAR | Status: AC
  Filled 2023-06-07: qty 1

## 2023-06-07 MED ORDER — PROPOFOL 10 MG/ML IV BOLUS
INTRAVENOUS | Status: DC | PRN
  Administered 2023-06-07: 40 mg via INTRAVENOUS
  Administered 2023-06-07: 140 mg via INTRAVENOUS
  Administered 2023-06-07: 60 mg via INTRAVENOUS

## 2023-06-07 MED ORDER — ATORVASTATIN CALCIUM 20 MG PO TABS
ORAL_TABLET | ORAL | Status: AC
Start: 2023-06-07 — End: ?
  Filled 2023-06-07: qty 4

## 2023-06-07 MED ORDER — OXYCODONE HCL 5 MG PO TABS
ORAL_TABLET | ORAL | Status: AC
  Filled 2023-06-07: qty 1

## 2023-06-07 MED ORDER — FUROSEMIDE 20 MG PO TABS
20.0000 mg | ORAL_TABLET | Freq: Two times a day (BID) | ORAL | Status: DC
  Administered 2023-06-07 – 2023-06-09 (×4): 20 mg via ORAL
  Filled 2023-06-07 (×2): qty 1

## 2023-06-07 MED ORDER — BUPIVACAINE HCL (PF) 0.5 % IJ SOLN
INTRAMUSCULAR | Status: AC
  Filled 2023-06-07: qty 10

## 2023-06-07 MED ORDER — FERROUS SULFATE 325 (65 FE) MG PO TABS
325.0000 mg | ORAL_TABLET | Freq: Every day | ORAL | Status: DC
  Administered 2023-06-07 – 2023-06-09 (×3): 325 mg via ORAL
  Filled 2023-06-07: qty 1

## 2023-06-07 MED ORDER — KETOROLAC TROMETHAMINE 15 MG/ML IJ SOLN
INTRAMUSCULAR | Status: AC
  Filled 2023-06-07: qty 1

## 2023-06-07 MED ORDER — TRANEXAMIC ACID-NACL 1000-0.7 MG/100ML-% IV SOLN
INTRAVENOUS | Status: AC
  Filled 2023-06-07: qty 100

## 2023-06-07 MED ORDER — ONDANSETRON HCL 4 MG/2ML IJ SOLN
INTRAMUSCULAR | Status: DC | PRN
  Administered 2023-06-07: 4 mg via INTRAVENOUS

## 2023-06-07 MED ORDER — FERROUS SULFATE 325 (65 FE) MG PO TABS
ORAL_TABLET | ORAL | Status: AC
  Filled 2023-06-07: qty 1

## 2023-06-07 MED ORDER — CEFAZOLIN SODIUM-DEXTROSE 2-4 GM/100ML-% IV SOLN
2.0000 g | Freq: Four times a day (QID) | INTRAVENOUS | Status: AC
  Administered 2023-06-07 – 2023-06-08 (×2): 2 g via INTRAVENOUS

## 2023-06-07 MED ORDER — CEFAZOLIN SODIUM-DEXTROSE 2-4 GM/100ML-% IV SOLN
INTRAVENOUS | Status: AC
Start: 2023-06-07 — End: ?
  Filled 2023-06-07: qty 100

## 2023-06-07 MED ORDER — FENTANYL CITRATE (PF) 100 MCG/2ML IJ SOLN
INTRAMUSCULAR | Status: AC
  Filled 2023-06-07: qty 2

## 2023-06-07 MED ORDER — TRAMADOL HCL 50 MG PO TABS: 50.0000 mg | ORAL_TABLET | Freq: Four times a day (QID) | ORAL | Status: DC | PRN

## 2023-06-07 MED ORDER — CEFAZOLIN SODIUM-DEXTROSE 2-4 GM/100ML-% IV SOLN
INTRAVENOUS | Status: AC
  Filled 2023-06-07: qty 100

## 2023-06-07 MED ORDER — MIDAZOLAM HCL 2 MG/2ML IJ SOLN
INTRAMUSCULAR | Status: AC
Start: 2023-06-07 — End: ?
  Filled 2023-06-07: qty 2

## 2023-06-07 MED ORDER — ASPIRIN 81 MG PO TBEC
81.0000 mg | DELAYED_RELEASE_TABLET | Freq: Two times a day (BID) | ORAL | Status: DC
Start: 2023-06-08 — End: 2023-06-09
  Administered 2023-06-08 – 2023-06-09 (×3): 81 mg via ORAL
  Filled 2023-06-07 (×3): qty 1

## 2023-06-07 MED ORDER — ONDANSETRON HCL 4 MG/2ML IJ SOLN: 4.0000 mg | Freq: Four times a day (QID) | INTRAMUSCULAR | Status: DC | PRN

## 2023-06-07 MED ORDER — SODIUM CHLORIDE (PF) 0.9 % IJ SOLN
INTRAMUSCULAR | Status: DC | PRN
  Administered 2023-06-07: 71 mL via INTRAMUSCULAR

## 2023-06-07 MED ORDER — HYDROMORPHONE HCL 1 MG/ML IJ SOLN
INTRAMUSCULAR | Status: AC
  Filled 2023-06-07: qty 1

## 2023-06-07 MED ORDER — KETOROLAC TROMETHAMINE 15 MG/ML IJ SOLN
7.5000 mg | Freq: Four times a day (QID) | INTRAMUSCULAR | Status: AC
  Administered 2023-06-07 – 2023-06-08 (×3): 7.5 mg via INTRAVENOUS

## 2023-06-07 MED ORDER — ONDANSETRON HCL 4 MG/2ML IJ SOLN: 4.0000 mg | Freq: Once | INTRAMUSCULAR | Status: DC | PRN

## 2023-06-07 MED ORDER — OXYCODONE HCL 5 MG/5ML PO SOLN: 5.0000 mg | Freq: Once | ORAL | Status: AC | PRN

## 2023-06-07 MED ORDER — KETOROLAC TROMETHAMINE 15 MG/ML IJ SOLN
INTRAMUSCULAR | Status: AC
Start: 2023-06-07 — End: ?
  Filled 2023-06-07: qty 1

## 2023-06-07 MED ORDER — LOSARTAN POTASSIUM 50 MG PO TABS
50.0000 mg | ORAL_TABLET | Freq: Every day | ORAL | Status: DC
  Administered 2023-06-07 – 2023-06-09 (×3): 50 mg via ORAL
  Filled 2023-06-07 (×2): qty 1

## 2023-06-07 MED ORDER — ONDANSETRON HCL 4 MG PO TABS: 4.0000 mg | ORAL_TABLET | Freq: Four times a day (QID) | ORAL | Status: DC | PRN

## 2023-06-07 MED ORDER — METOPROLOL TARTRATE 25 MG PO TABS
ORAL_TABLET | ORAL | Status: AC
Start: 2023-06-07 — End: ?
  Filled 2023-06-07: qty 1

## 2023-06-07 MED ORDER — SEVOFLURANE IN SOLN
RESPIRATORY_TRACT | Status: AC
  Filled 2023-06-07: qty 250

## 2023-06-07 MED ORDER — PANTOPRAZOLE SODIUM 40 MG PO TBEC
DELAYED_RELEASE_TABLET | ORAL | Status: AC
  Filled 2023-06-07: qty 1

## 2023-06-07 MED ORDER — ACETAMINOPHEN 500 MG PO TABS
1000.0000 mg | ORAL_TABLET | Freq: Three times a day (TID) | ORAL | Status: AC
  Administered 2023-06-07 – 2023-06-08 (×3): 1000 mg via ORAL
  Filled 2023-06-07: qty 2

## 2023-06-07 MED ORDER — DEXAMETHASONE SODIUM PHOSPHATE 10 MG/ML IJ SOLN
INTRAMUSCULAR | Status: DC | PRN
  Administered 2023-06-07: 10 mg via INTRAVENOUS

## 2023-06-07 MED ORDER — SODIUM CHLORIDE 0.9 % IR SOLN
Status: DC | PRN
  Administered 2023-06-07: 3000 mL

## 2023-06-07 MED ORDER — HYDROMORPHONE HCL 1 MG/ML IJ SOLN
INTRAMUSCULAR | Status: DC | PRN
  Administered 2023-06-07: 1 mg via INTRAVENOUS
  Administered 2023-06-07 (×2): .5 mg via INTRAVENOUS

## 2023-06-07 MED ORDER — PROPOFOL 1000 MG/100ML IV EMUL
INTRAVENOUS | Status: AC
  Filled 2023-06-07: qty 100

## 2023-06-07 MED ORDER — DEXMEDETOMIDINE HCL IN NACL 80 MCG/20ML IV SOLN
INTRAVENOUS | Status: DC | PRN
  Administered 2023-06-07: 8 ug via INTRAVENOUS
  Administered 2023-06-07: 4 ug via INTRAVENOUS
  Administered 2023-06-07: 8 ug via INTRAVENOUS

## 2023-06-07 MED ORDER — MORPHINE SULFATE (PF) 4 MG/ML IV SOLN
0.5000 mg | INTRAVENOUS | Status: DC | PRN
  Administered 2023-06-09: 1 mg via INTRAVENOUS
  Filled 2023-06-07: qty 1

## 2023-06-07 MED ORDER — LACTATED RINGERS IV SOLN: INTRAVENOUS | Status: DC | PRN

## 2023-06-07 MED ORDER — METOCLOPRAMIDE HCL 5 MG/ML IJ SOLN: 5.0000 mg | Freq: Three times a day (TID) | INTRAMUSCULAR | Status: DC | PRN

## 2023-06-07 MED ORDER — PHENYLEPHRINE 80 MCG/ML (10ML) SYRINGE FOR IV PUSH (FOR BLOOD PRESSURE SUPPORT)
PREFILLED_SYRINGE | INTRAVENOUS | Status: DC | PRN
  Administered 2023-06-07: 80 ug via INTRAVENOUS
  Administered 2023-06-07 (×2): 40 ug via INTRAVENOUS
  Administered 2023-06-07: 80 ug via INTRAVENOUS

## 2023-06-07 MED ORDER — SUCCINYLCHOLINE CHLORIDE 200 MG/10ML IV SOSY
PREFILLED_SYRINGE | INTRAVENOUS | Status: DC | PRN
  Administered 2023-06-07: 120 mg via INTRAVENOUS

## 2023-06-07 MED ORDER — FUROSEMIDE 20 MG PO TABS
ORAL_TABLET | ORAL | Status: AC
Start: 2023-06-07 — End: ?
  Filled 2023-06-07: qty 1

## 2023-06-07 MED ORDER — ATORVASTATIN CALCIUM 20 MG PO TABS
80.0000 mg | ORAL_TABLET | Freq: Every day | ORAL | Status: DC
  Administered 2023-06-07 – 2023-06-08 (×2): 80 mg via ORAL
  Filled 2023-06-07: qty 4

## 2023-06-07 MED ORDER — LIDOCAINE HCL (CARDIAC) PF 100 MG/5ML IV SOSY
PREFILLED_SYRINGE | INTRAVENOUS | Status: DC | PRN
  Administered 2023-06-07: 20 mg via INTRAVENOUS

## 2023-06-07 MED ORDER — CHLORHEXIDINE GLUCONATE 0.12 % MT SOLN
OROMUCOSAL | Status: AC
  Filled 2023-06-07: qty 15

## 2023-06-07 MED ORDER — DOCUSATE SODIUM 100 MG PO CAPS
100.0000 mg | ORAL_CAPSULE | Freq: Two times a day (BID) | ORAL | Status: DC
  Administered 2023-06-07 – 2023-06-09 (×4): 100 mg via ORAL
  Filled 2023-06-07 (×2): qty 1

## 2023-06-07 MED ORDER — METOPROLOL TARTRATE 25 MG PO TABS
12.5000 mg | ORAL_TABLET | Freq: Two times a day (BID) | ORAL | Status: DC
  Administered 2023-06-07 – 2023-06-09 (×4): 12.5 mg via ORAL
  Filled 2023-06-07 (×3): qty 1

## 2023-06-07 MED ORDER — ROCURONIUM BROMIDE 100 MG/10ML IV SOLN
INTRAVENOUS | Status: DC | PRN
  Administered 2023-06-07: 60 mg via INTRAVENOUS
  Administered 2023-06-07 (×2): 20 mg via INTRAVENOUS

## 2023-06-07 MED ORDER — ACETAMINOPHEN 325 MG PO TABS: 325.0000 mg | ORAL_TABLET | Freq: Four times a day (QID) | ORAL | Status: DC | PRN

## 2023-06-07 MED ORDER — PROPOFOL 10 MG/ML IV BOLUS
INTRAVENOUS | Status: AC
  Filled 2023-06-07: qty 20

## 2023-06-07 MED ORDER — CLOPIDOGREL BISULFATE 75 MG PO TABS
75.0000 mg | ORAL_TABLET | Freq: Every day | ORAL | Status: DC
  Administered 2023-06-08 – 2023-06-09 (×2): 75 mg via ORAL
  Filled 2023-06-07: qty 1

## 2023-06-07 MED ORDER — OXYCODONE HCL 5 MG PO TABS
5.0000 mg | ORAL_TABLET | ORAL | Status: DC | PRN
  Administered 2023-06-07 – 2023-06-09 (×4): 5 mg via ORAL
  Filled 2023-06-07: qty 1

## 2023-06-07 MED ORDER — ACETAMINOPHEN 500 MG PO TABS
ORAL_TABLET | ORAL | Status: AC
  Filled 2023-06-07: qty 2

## 2023-06-07 MED ORDER — SURGIPHOR WOUND IRRIGATION SYSTEM - OPTIME: TOPICAL | Status: DC | PRN

## 2023-06-07 MED ORDER — MIDAZOLAM HCL 2 MG/2ML IJ SOLN
INTRAMUSCULAR | Status: DC | PRN
  Administered 2023-06-07: 1 mg via INTRAVENOUS

## 2023-06-07 MED ORDER — ACETAMINOPHEN 10 MG/ML IV SOLN
INTRAVENOUS | Status: AC
  Filled 2023-06-07: qty 100

## 2023-06-07 MED ORDER — PHENYLEPHRINE 80 MCG/ML (10ML) SYRINGE FOR IV PUSH (FOR BLOOD PRESSURE SUPPORT)
PREFILLED_SYRINGE | INTRAVENOUS | Status: AC
  Filled 2023-06-07: qty 10

## 2023-06-07 MED ORDER — PANTOPRAZOLE SODIUM 40 MG PO TBEC
40.0000 mg | DELAYED_RELEASE_TABLET | Freq: Every day | ORAL | Status: DC
  Administered 2023-06-07 – 2023-06-09 (×3): 40 mg via ORAL
  Filled 2023-06-07: qty 1

## 2023-06-07 MED ORDER — OXYCODONE HCL 5 MG PO TABS
5.0000 mg | ORAL_TABLET | Freq: Once | ORAL | Status: AC | PRN
  Administered 2023-06-07: 5 mg via ORAL

## 2023-06-07 MED ORDER — METOCLOPRAMIDE HCL 10 MG PO TABS: 5.0000 mg | ORAL_TABLET | Freq: Three times a day (TID) | ORAL | Status: DC | PRN

## 2023-06-07 MED ORDER — SODIUM CHLORIDE 0.9 % IV SOLN: INTRAVENOUS | Status: DC

## 2023-06-07 MED ORDER — ACETAMINOPHEN 10 MG/ML IV SOLN: 1000.0000 mg | Freq: Once | INTRAVENOUS | Status: DC | PRN

## 2023-06-07 MED ORDER — DOCUSATE SODIUM 100 MG PO CAPS
ORAL_CAPSULE | ORAL | Status: AC
  Filled 2023-06-07: qty 1

## 2023-06-07 MED ORDER — FENTANYL CITRATE (PF) 100 MCG/2ML IJ SOLN
25.0000 ug | INTRAMUSCULAR | Status: DC | PRN
Start: 2023-06-07 — End: 2023-06-07
  Administered 2023-06-07 (×4): 25 ug via INTRAVENOUS

## 2023-06-07 MED ORDER — FENTANYL CITRATE (PF) 100 MCG/2ML IJ SOLN
INTRAMUSCULAR | Status: DC | PRN
  Administered 2023-06-07: 100 ug via INTRAVENOUS
  Administered 2023-06-07 (×2): 50 ug via INTRAVENOUS

## 2023-06-07 MED ORDER — PHENYLEPHRINE HCL-NACL 20-0.9 MG/250ML-% IV SOLN
INTRAVENOUS | Status: DC | PRN
  Administered 2023-06-07: 20 ug/min via INTRAVENOUS

## 2023-06-07 MED ORDER — ACETAMINOPHEN 10 MG/ML IV SOLN
INTRAVENOUS | Status: DC | PRN
  Administered 2023-06-07: 1000 mg via INTRAVENOUS

## 2023-06-07 MED ORDER — MENTHOL 3 MG MT LOZG: 1.0000 | LOZENGE | OROMUCOSAL | Status: DC | PRN

## 2023-06-07 MED ORDER — PHENOL 1.4 % MT LIQD: 1.0000 | OROMUCOSAL | Status: DC | PRN

## 2023-06-07 MED ORDER — SUGAMMADEX SODIUM 200 MG/2ML IV SOLN
INTRAVENOUS | Status: DC | PRN
  Administered 2023-06-07: 400 mg via INTRAVENOUS

## 2023-06-07 SURGICAL SUPPLY — 72 items
BLADE CLIPPER SURG (BLADE) IMPLANT
BLADE PATELLA REAM PILOT HOLE (MISCELLANEOUS) IMPLANT
BLADE SAGITTAL AGGR TOOTH XLG (BLADE) IMPLANT
BLADE SAW SAG 25X90X1.19 (BLADE) ×1 IMPLANT
BLADE SAW SAG 29X58X.64 (BLADE) ×1 IMPLANT
BNDG ELASTIC 6INX 5YD STR LF (GAUZE/BANDAGES/DRESSINGS) ×1 IMPLANT
BOWL CEMENT MIX W/ADAPTER (MISCELLANEOUS) ×1 IMPLANT
CEMENT BONE R 1X40 (Cement) ×2 IMPLANT
CHLORAPREP W/TINT 26 (MISCELLANEOUS) ×2 IMPLANT
COMP FEM CMT PERSONA SZ7 RT (Joint) ×1 IMPLANT
COMPONENT FEM CMT PRSONA SZ7RT (Joint) IMPLANT
COOLER POLAR GLACIER W/PUMP (MISCELLANEOUS) ×1 IMPLANT
CUFF TRNQT CYL 24X4X16.5-23 (TOURNIQUET CUFF) IMPLANT
CUFF TRNQT CYL 30X4X21-28X (TOURNIQUET CUFF) IMPLANT
CUFF TRNQT CYL 34X4X40X1 (TOURNIQUET CUFF) IMPLANT
DERMABOND ADVANCED .7 DNX12 (GAUZE/BANDAGES/DRESSINGS) ×1 IMPLANT
DRAPE 3/4 80X56 (DRAPES) ×1 IMPLANT
DRAPE INCISE IOBAN 66X60 STRL (DRAPES) IMPLANT
DRSG MEPILEX SACRM 8.7X9.8 (GAUZE/BANDAGES/DRESSINGS) ×1 IMPLANT
DRSG OPSITE POSTOP 4X10 (GAUZE/BANDAGES/DRESSINGS) IMPLANT
DRSG OPSITE POSTOP 4X8 (GAUZE/BANDAGES/DRESSINGS) IMPLANT
ELECT REM PT RETURN 9FT ADLT (ELECTROSURGICAL) ×1
ELECTRODE REM PT RTRN 9FT ADLT (ELECTROSURGICAL) ×1 IMPLANT
GLOVE BIO SURGEON STRL SZ8 (GLOVE) ×1 IMPLANT
GLOVE BIOGEL PI IND STRL 8 (GLOVE) ×1 IMPLANT
GLOVE PI ORTHO PRO STRL 7.5 (GLOVE) ×2 IMPLANT
GLOVE PI ORTHO PRO STRL SZ8 (GLOVE) ×2 IMPLANT
GLOVE SURG SYN 7.5 E (GLOVE) ×1 IMPLANT
GLOVE SURG SYN 7.5 PF PI (GLOVE) ×1 IMPLANT
GOWN SRG XL LVL 3 NONREINFORCE (GOWNS) ×1 IMPLANT
GOWN STRL REUS W/ TWL LRG LVL3 (GOWN DISPOSABLE) ×1 IMPLANT
GOWN STRL REUS W/ TWL XL LVL3 (GOWN DISPOSABLE) ×1 IMPLANT
HOOD PEEL AWAY T7 (MISCELLANEOUS) ×2 IMPLANT
INSERT TIB ASF SZ 6-7/EF 10 RT (Insert) IMPLANT
IV NS IRRIG 3000ML ARTHROMATIC (IV SOLUTION) ×1 IMPLANT
KIT TURNOVER KIT A (KITS) ×1 IMPLANT
MANIFOLD NEPTUNE II (INSTRUMENTS) ×1 IMPLANT
MARKER SKIN DUAL TIP RULER LAB (MISCELLANEOUS) ×1 IMPLANT
MAT ABSORB FLUID 56X50 GRAY (MISCELLANEOUS) ×1 IMPLANT
NDL HYPO 21X1.5 SAFETY (NEEDLE) ×1 IMPLANT
NEEDLE HYPO 21X1.5 SAFETY (NEEDLE) ×1 IMPLANT
PACK TOTAL KNEE (MISCELLANEOUS) ×1 IMPLANT
PAD ARMBOARD 7.5X6 YLW CONV (MISCELLANEOUS) ×3 IMPLANT
PAD WRAPON POLAR KNEE (MISCELLANEOUS) ×1 IMPLANT
PAD WRAPON POLOR MULTI XL (MISCELLANEOUS) IMPLANT
PENCIL SMOKE EVACUATOR (MISCELLANEOUS) ×1 IMPLANT
PIN DRILL HDLS TROCAR 75 4PK (PIN) IMPLANT
PULSAVAC PLUS IRRIG FAN TIP (DISPOSABLE) ×1
SCREW FEMALE HEX FIX 25X2.5 (ORTHOPEDIC DISPOSABLE SUPPLIES) IMPLANT
SCREW HEX HEADED 3.5X27 DISP (ORTHOPEDIC DISPOSABLE SUPPLIES) IMPLANT
SLEEVE SCD COMPRESS KNEE MED (STOCKING) ×1 IMPLANT
SOLUTION IRRIG SURGIPHOR (IV SOLUTION) ×1 IMPLANT
STEM POLY PAT PLY 32M KNEE (Knees) IMPLANT
STEM TIB ST PERS 14+30 (Stem) IMPLANT
STEM TIBIA 5 DEG SZ F R KNEE (Knees) IMPLANT
SUT STRATA 1 CT-1 DLB (SUTURE) ×1
SUT STRATAFIX 14 PDO 48 VLT (SUTURE) ×1 IMPLANT
SUT STRATAFIX PDO 1 14 VIOLET (SUTURE) ×1
SUT VIC AB 0 CT1 36 (SUTURE) ×1 IMPLANT
SUT VIC AB 2-0 CT2 27 (SUTURE) ×2 IMPLANT
SUT VICRYL 1-0 27IN ABS (SUTURE) ×1
SUTURE STRATA SPIR 4-0 18 (SUTURE) ×1 IMPLANT
SUTURE VICRYL 1-0 27IN ABS (SUTURE) ×1 IMPLANT
SYR 30ML LL (SYRINGE) ×2 IMPLANT
TAPE CLOTH 3X10 WHT NS LF (GAUZE/BANDAGES/DRESSINGS) ×1 IMPLANT
TIBIA STEM 5 DEG SZ F R KNEE (Knees) ×1 IMPLANT
TIP FAN IRRIG PULSAVAC PLUS (DISPOSABLE) ×1 IMPLANT
TOWEL OR 17X26 4PK STRL BLUE (TOWEL DISPOSABLE) IMPLANT
TRAP FLUID SMOKE EVACUATOR (MISCELLANEOUS) ×1 IMPLANT
WATER STERILE IRR 1000ML POUR (IV SOLUTION) ×1 IMPLANT
WRAP-ON POLOR PAD MULTI XL (MISCELLANEOUS) ×1
WRAPON POLAR PAD KNEE (MISCELLANEOUS)

## 2023-06-07 NOTE — Evaluation (Signed)
Physical Therapy Evaluation Patient Details Name: Sarah Medina MRN: [id number] DOB: February 08, 1966 Today's Date: 06/07/2023  History of Present Illness  Pt is 57 y/o admitted 06/07/23 for right total knee replacement. Right Total Knee Arthroplasty procedure dated 06/07/23.  Clinical Impression  Pt received in bed and agreed to PT session. Pt reported pain to be on a scale of 8/10. Pt performed bed mobility MinA for RLE management, STS with the use of RW (2wheels) MinA, step pivot transfer to [facility] MinA, and amb ~53ft CGA prior to ending session in bed due to pain. VC necessary throughout session for RW management. Pt able to maintain appropriate WB precautions during session, however performs overall mobility with increased caution regarding WB. Pt tolerated Tx fair and was severely limited by pain. Pt will continue to benefit from skilled PT sessions to improve strength, ROM, functional mobility, and activity tolerance to maximize safety/return to PLOF following D/C.        If plan is discharge home, recommend the following: A little help with walking and/or transfers;Assist for transportation;Help with stairs or ramp for entrance   Can travel by private vehicle        Equipment Recommendations BSC/3in1  Recommendations for Other Services       Functional Status Assessment Patient has had a recent decline in their functional status and demonstrates the ability to make significant improvements in function in a reasonable and predictable amount of time.     Precautions / Restrictions Precautions Precautions: Knee Precaution Booklet Issued: Yes (comment) Precaution Comments: PT TKR HEP and precautions packet. Restrictions Weight Bearing Restrictions: Yes RLE Weight Bearing: Weight bearing as tolerated      Mobility  Bed Mobility Overal bed mobility: Needs Assistance Bed Mobility: Supine to Sit, Sit to Supine     Supine to sit: Min assist Sit to supine: Min assist   General bed  mobility comments: Pt performed bed mobility MinA for RLE management.    Transfers Overall transfer level: Needs assistance Equipment used: Rolling walker (2 wheels) Transfers: Sit to/from Stand, Bed to chair/wheelchair/BSC Sit to Stand: Min assist   Step pivot transfers: Min assist       General transfer comment: Pt completed all transfers MinA with VC necessary for RW management. Pt did not report any s/sx relative to dizziness when standing.    Ambulation/Gait Ambulation/Gait assistance: Contact guard assist Gait Distance (Feet): 7 Feet Assistive device: Rolling walker (2 wheels) Gait Pattern/deviations: Step-to pattern, Antalgic Gait velocity: decreased; cautious     General Gait Details: Pt amb with the use of RW (2wheels) CGA. Pt presented with increased pain during amb which resulted in her declining further mobility.  Stairs            Wheelchair Mobility     Tilt Bed    Modified Rankin (Stroke Patients Only)       Balance Overall balance assessment: Needs assistance Sitting-balance support: Feet supported Sitting balance-Leahy Scale: Good     Standing balance support: Bilateral upper extremity supported, During functional activity, Reliant on assistive device for balance Standing balance-Leahy Scale: Fair                               Pertinent Vitals/Pain Pain Assessment Pain Assessment: 0-10 Pain Score: 8  Pain Descriptors / Indicators: Aching, Constant Pain Intervention(s): Limited activity within patient's tolerance, RN gave pain meds during session, Ice applied    Home Living Family/patient expects to  be discharged to:: Private residence Living Arrangements: Spouse/significant other Available Help at Discharge: Family;Available 24 hours/day Type of Home: Apartment Home Access: Level entry       Home Layout: One level Home Equipment: Agricultural consultant (2 wheels);Cane - single point Additional Comments: Pt reports that  husband had lung surgery a few years ago and is limited with assistance in certain areas such as lifting heavy items    Prior Function Prior Level of Function : Independent/Modified Independent             Mobility Comments: Pt reports IND prior to admission. Pt reports using RW (2wheels) due to previous knee pain ADLs Comments: Pt reports IND prior to admission     Extremity/Trunk Assessment   Upper Extremity Assessment Upper Extremity Assessment: Overall WFL for tasks assessed    Lower Extremity Assessment Lower Extremity Assessment: RLE deficits/detail RLE Deficits / Details: TKR       Communication   Communication Communication: No apparent difficulties Cueing Techniques: Verbal cues  Cognition Arousal: Alert Behavior During Therapy: WFL for tasks assessed/performed Overall Cognitive Status: Within Functional Limits for tasks assessed                                 General Comments: AOx4. Pt pleasant and willing to participate in PT session.        General Comments      Exercises Other Exercises Other Exercises: Pt performed step pivot transfer to [facility] MinA and performed all toileting IND.   Assessment/Plan    PT Assessment Patient needs continued PT services  PT Problem List Decreased strength;Decreased range of motion;Decreased activity tolerance;Decreased balance;Decreased mobility;Pain       PT Treatment Interventions Gait training;DME instruction;Functional mobility training;Therapeutic activities;Therapeutic exercise;Balance training    PT Goals (Current goals can be found in the Care Plan section)  Acute Rehab PT Goals Patient Stated Goal: No goals stated PT Goal Formulation: With patient Time For Goal Achievement: 06/21/23 Potential to Achieve Goals: Good    Frequency BID     Co-evaluation               AM-PAC PT "6 Clicks" Mobility  Outcome Measure Help needed turning from your back to your side while in a flat bed  without using bedrails?: A Little Help needed moving from lying on your back to sitting on the side of a flat bed without using bedrails?: A Little Help needed moving to and from a bed to a chair (including a wheelchair)?: A Little Help needed standing up from a chair using your arms (e.g., wheelchair or bedside chair)?: A Lot Help needed to walk in hospital room?: A Little Help needed climbing 3-5 steps with a railing? : A Lot 6 Click Score: 16    End of Session Equipment Utilized During Treatment: Gait belt Activity Tolerance: Patient limited by pain Patient left: in bed;with bed alarm set;with nursing/sitter in room Nurse Communication: Mobility status PT Visit Diagnosis: Other abnormalities of gait and mobility (R26.89);Muscle weakness (generalized) (M62.81);Difficulty in walking, not elsewhere classified (R26.2);Pain Pain - Right/Left: Right Pain - part of body: Knee    Time: 1557-1620 PT Time Calculation (min) (ACUTE ONLY): 23 min   Charges:   PT Evaluation $PT Eval Low Complexity: 1 Low PT Treatments $Therapeutic Activity: 8-22 mins PT General Charges $$ ACUTE PT VISIT: 1 Visit         Johan Antonacci [place] Howard SPT, LAT, ATC  Dolph Tavano Sau[place]-Howard 06/07/2023, 4:52 PM

## 2023-06-07 NOTE — Transfer of Care (Signed)
Immediate Anesthesia Transfer of Care Note  Patient: Sarah Medina  Procedure(s) Performed: TOTAL KNEE ARTHROPLASTY (Right: Knee)  Patient Location: PACU  Anesthesia Type:General  Level of Consciousness: drowsy and patient cooperative  Airway & Oxygen Therapy: Patient Spontanous Breathing and Patient connected to face mask oxygen  Post-op Assessment: Report given to RN and Post -op Vital signs reviewed and unstable, Anesthesiologist notified  Post vital signs: Reviewed and stable  Last Vitals:  Vitals Value Taken Time  BP 135/59 06/07/23 1336  Temp 36.3 C 06/07/23 1336  Pulse 67 06/07/23 1337  Resp 9 06/07/23 1337  SpO2 100 % 06/07/23 1337  Vitals shown include unfiled device data.  Last Pain:  Vitals:   06/07/23 1336  TempSrc: Tympanic  PainSc: 0-No pain         Complications: No notable events documented.

## 2023-06-07 NOTE — H&P (Signed)
History of Present Illness: The patient is an 57 y.o. female presents to the office for history and physical for right total knee arthroplasty with Dr. Audelia Acton on 06/07/2023. Patient has had chronic progressing right knee pain. X-rays show complete loss of joint space in the lateral compartment with Kellgren-Lawrence grade 4 changes throughout the knee. She has had no relief with conservative treatment. Her pain interferes with her quality of life and activities daily living. Her pain is 9 out of 10 at its worst. She has been unable to get relief with Tylenol, injections. Currently on Plavix and unable to take NSAIDs. Her pain is constant, along the lateral greater than medial joint line and worse with standing and walking. Her knee will buckle and give way on her. Her pain interferes with quality of life and activities a living.  Patient is a non-smoker and nondiabetic with an A1c of 6.1. She has a BMI of 41.  Past Medical History: Past Medical History:  Diagnosis Date  Carotid artery occlusion  Heart attack (CMS/HHS-HCC)  Hyperlipidemia  Hypertension   Past Surgical History: Past Surgical History:  Procedure Laterality Date  KNEE ARTHROSCOPY Right 2002  CARDIAC CATHETERIZATION   Past Family History: Family History  Problem Relation Age of Onset  Myocardial Infarction (Heart attack) Father   Medications: Current Outpatient Medications  Medication Sig Dispense Refill  aspirin 81 MG chewable tablet CHEW AND SWALLOW 1 T PO D. 0  atorvastatin (LIPITOR) 80 MG tablet Take 1 tablet (80 mg total) by mouth once daily 90 tablet 1  cholecalciferol, vitamin D3, 50,000 unit Tab Take 1 tablet by mouth every 7 (seven) days  clopidogreL (PLAVIX) 75 mg tablet Take 1 tablet (75 mg total) by mouth once daily 90 tablet 3  ferrous sulfate 325 (65 FE) MG tablet Take 325 mg by mouth daily with breakfast  fluticasone propionate (FLONASE) 50 mcg/actuation nasal spray Place 1 spray into both nostrils once  daily  FUROsemide (LASIX) 20 MG tablet Take 40 mg by mouth once daily  hydroCHLOROthiazide (HYDRODIURIL) 12.5 MG tablet Take 1 tablet by mouth once daily  losartan (COZAAR) 50 MG tablet Take 1 tablet by mouth once daily  metoprolol tartrate (LOPRESSOR) 25 MG tablet Take 0.5 tablets (12.5 mg total) by mouth 2 (two) times daily 45 tablet 1  nitroGLYcerin (NITROSTAT) 0.4 MG SL tablet Place 1 tablet (0.4 mg total) under the tongue every 5 (five) minutes as needed for Chest pain Do not take more than 3 total. 25 tablet 2   No current facility-administered medications for this visit.   Allergies: Allergies  Allergen Reactions  Enalapril Swelling  Swelling face    Visit Vitals: Vitals:  05/23/23 1005  BP: 120/78    Review of Systems:  A comprehensive 14 point ROS was performed, reviewed, and the pertinent orthopaedic findings are documented in the HPI.  Physical Exam: General:  Well developed, well nourished, no apparent distress, normal affect, presents in a wheelchair HEENT: Head normocephalic, atraumatic, PERRL.   Abdomen: Soft, non tender, non distended, Bowel sounds present.  Heart: Examination of the heart reveals regular, rate, and rhythm. There is no murmur noted on ascultation. There is a normal apical pulse.  Lungs: Lungs are clear to auscultation. There is no wheeze, rhonchi, or crackles. There is normal expansion of bilateral chest walls.   Comprehensive Knee Exam: Gait Antalgic  Alignment Neutral   Inspection Right  Skin Normal appearance with no obvious deformity. Healed arthroscopic portals. No ecchymosis or erythema.  Soft Tissue  No focal soft tissue swelling  Quad Atrophy None   Palpation  Right  Tenderness Medial, lateral, and parapatellar tenderness to palpation  Crepitus + patellofemoral and tibiofemoral crepitus  Effusion None   Range of Motion Right  Flexion 5-85  Extension Full knee extension without hyperextension   Ligamentous Exam Right   Lachman 1+  Valgus 0 Normal  Valgus 30 Normal  Varus 0 Normal  Varus 30 Normal  Anterior Drawer 1+  Posterior Drawer Normal   Meniscal Exam Right  Hyperflexion Test Positive  Hyperextension Test Positive  McMurray's Positive    Neurovascular Right  Quadriceps Strength 5/5  Hamstring Strength 5/5  Hip Abductor Strength 4/5  Distal Motor Normal  Distal Sensory Normal light touch sensation  Distal Pulses Normal    Imaging Studies: 3 views weightbearing of the right knee AP, lateral and sunrise taken on 04/11/2023 images reviewed by myself. There is severe degenerative changes with lateral and medial joint space narrowing with lateral bone-on-bone articulation, osteophyte formation, sclerosis, and subchondral cyst formation. There is also severe patellofemoral degeneration. Kellgren-Lawrence grade 4. No fractures or dislocations noted.  Assessment:  Right knee osteoarthritis  Plan: Sarah Medina is a 57 year old female who presents for history and physical for right total knee arthroplasty with Dr. Audelia Acton on 06/07/2023. She has complete loss of joint space in the lateral compartment of the right knee with tricompartmental arthritic changes. Pain is severe and debilitating and interferes with her quality of life and activities day living. No relief with conservative treatment. Risks, benefits, complications of a right total knee arthroplasty have been discussed with the patient, patient has agreed and consented to a right total knee arthroplasty with Dr. Audelia Acton on 06/07/2023  The hospitalization and post-operative care and rehabilitation were also discussed. The use of perioperative antibiotics and DVT prophylaxis were discussed. The risk, benefits and alternatives to a surgical intervention were discussed at length with the patient. The patient was also advised of risks related to the medical comorbidities and elevated body mass index (BMI). A lengthy discussion took place to review the most  common complications including but not limited to: stiffness, loss of function, complex regional pain syndrome, deep vein thrombosis, pulmonary embolus, heart attack, stroke, infection, wound breakdown, numbness, intraoperative fracture, damage to nerves, tendon,muscles, arteries or other blood vessels, death and other possible complications from anesthesia. The patient was told that we will take steps to minimize these risks by using sterile technique, antibiotics and DVT prophylaxis when appropriate and follow the patient postoperatively in the office setting to monitor progress. The possibility of recurrent pain, no improvement in pain and actual worsening of pain were also discussed with the patient. He spent a little more time talking about her weight and the importance of losing weight. Currently her BMI is 41 slightly over the recommended operating number for knee replacement we discussed that she will need to maintain or lose weight between now and planned surgery. If she has a significant increase in weight between now and surgery may have to delay. The patient accepts the increased risk of proceeding with surgery with a slightly elevated BMI over recommended cutoffs.   All questions answered patient agrees with above plan to proceed with right total knee arthroplasty.

## 2023-06-07 NOTE — Anesthesia Preprocedure Evaluation (Addendum)
Anesthesia Evaluation  Patient identified by MRN, date of birth, ID band Patient awake    Reviewed: Allergy & Precautions, NPO status , Patient's Chart, lab work & pertinent test results  History of Anesthesia Complications Negative for: history of anesthetic complications  Airway Mallampati: III  TM Distance: >3 FB Neck ROM: Full    Dental no notable dental hx. (+) Teeth Intact   Pulmonary shortness of breath and with exertion, neg sleep apnea, neg COPD, Patient abstained from smoking.Not current smoker   Pulmonary exam normal breath sounds clear to auscultation       Cardiovascular Exercise Tolerance: Poor METShypertension, + CAD, + Past MI, + Cardiac Stents and + Peripheral Vascular Disease  + dysrhythmias Ventricular Fibrillation  Rhythm:Regular Rate:Normal - Systolic murmurs  Patient suffered an inferior wall STEMI on 07/20/2015.  Diagnostic LEFT heart catheterization was performed revealing multivessel CAD; 50% distal LCx and 100% proximal RCA.  PCI was subsequently performed placing a 2.25 x 23 mm Xience Alpine DES x 1 to the proximal RCA lesion.  Procedure yielded excellent angiographic result and TIMI-3 flow.    Two days following her initial STEMI, patient demonstrated signs of a second inferior STEMI.  Patient was taken back to the cardiac catheterization lab.1 patient was transferred from the stretcher to the catheterization lab table, patient experienced ventricular fibrillation cardiac arrest.  She required defibrillation's x 4.  Patient overall hemodynamically unstable with SBP in the 70s.  She was started on amiodarone and dopamine drips.  Repeat cardiac catheterization revealed 80% stenosis of the proximal LAD and 100% thrombotic occlusion of the previously placed proximal RCA stent.  PTCA was performed to the proximal RCA restoring TIMI-3 flow.  Given patient's instability, further intervention was deferred at that time.   Plans were for staged PCI of the LAD lesion at a later date.    Diagnostic LEFT heart catheterization was performed on 07/12/2016 revealing multivessel CAD; 50% LM, 70% mid RCA, and 80% proximal LAD.  Previously placed stent to the proximal RCA was widely patent.  PCI was subsequently performed placing a 3.25 x 18 mm Xience Alpine DES x 1 to the proximal LAD lesion.  Procedure yielded excellent angiographic result and TIMI-3 flow.    Most recent TTE was performed on 03/09/2019 revealing a normal left ventricular systolic function with an EF of 55 to 60%.  There were no regional wall motion abnormalities.  Right ventricular size and function normal.  There was no significant valvular regurgitation. All transvalvular gradients were noted to be normal providing no evidence suggestive of valvular stenosis. Aorta normal in size with no evidence of aneurysmal dilatation.    Most recent myocardial perfusion imaging study was performed on 05/17/2023 revealing a normal left ventricular systolic function with an EF of 63%.  End systolic and diastolic cavity size noted to be normal.  Patient able to achieve 60% of her MPHR (max heart rate 98 bpm).  Peak METS achieved was 1.0.  There was no evidence of stress-induced myocardial ischemia or arrhythmia; no scintigraphic evidence of scar.  Patient reported fatigue and shortness of breath during the stress test.  Overall, study determined to be low risk.   Following previous thrombotic occlusion of cardiac stent, patient remains on daily DAPT therapy using ASA + clopidogrel.  Patient is reported to be compliant with therapy with no evidence or reports of GI bleeding. Blood pressure well controlled at 100/60 mmHg on currently prescribed diuretic (furosemide) + ARB (losartan) + beta-blocker (metoprolol tartrate) therapies.  Patient  is on atorvastatin for her HLD diagnosis and ASCVD prevention.  Patient has a supply of short acting nitrates (NTG) to use on a as needed basis  for recurrent angina/anginal equivalent symptoms; denied recent use.  Patient has a prediabetes diagnosis.  Her last hemoglobin A1c was 6.3% when checked on 05/05/2023. Patient is able to complete all of her  ADL/IADLs without cardiovascular limitation.  Per the DASI, patient is able to achieve at least 4 METS of physical activity without experiencing any significant degree of angina/anginal equivalent symptoms. No changes were made to her medication regimen during her visit with cardiology.  Patient scheduled to follow-up with outpatient cardiology in 6 months or sooner if needed.   Sarah Medina is scheduled for an elective TOTAL KNEE ARTHROPLASTY (Right: Knee) on 06/07/2023 with Dr. Reinaldo Berber, MD.  Given patient's past medical history significant for cardiovascular diagnoses, presurgical cardiac clearance was sought by the PAT team. Per cardiology, "this patient is optimized for surgery and may proceed with the planned procedural course with a MODERATE risk of significant perioperative cardiovascular complications".    Neuro/Psych neg Seizures negative neurological ROS  negative psych ROS   GI/Hepatic ,neg GERD  ,,(+)     (-) substance abuse    Endo/Other  neg diabetes  Class 3 obesity  Renal/GU negative Renal ROS     Musculoskeletal   Abdominal  (+) + obese  Peds  Hematology On blood thinners, took plavix 3 days ago   Anesthesia Other Findings Past Medical History: No date: Anemia No date: Anginal pain (HCC) No date: Arthritis 07/20/2015: CAD in native artery     Comment:  a.) LHC/PCI: 50% dLCx, 100% pRCA (2.25 x 23 mm Xience               Alpine DES); b.) LHC/PTCA 07/23/2015: 80% pLAD, 100%               thrombotic occlusion of pRCA stent (PTCA). Due to               hemodynamic instability, PCI of LAD planed for later               date; c.) LHC/PCI 07/12/2016: 50% LM, 70% mRCA, 80% pLAD               (3.25 x 18 mm Xience Alpine DES); d.) MV 09/20/2018: no                ischemia; e.) MV 02/08/2021: no ischemia; f.) MV               05/17/2023: no ischemia 07/23/2015: Cardiac arrest with ventricular fibrillation (HCC)     Comment:  a.) developed in setting of inferior STEMI when patient               was transferred from stretcher to cath lab table -->               required defibrillation x 4. (+) hemodynamic instability               with SBPs in the 70s. Started on dopamine and amiodarone.              Plans for further staged PCI at a later date. No date: Carotid artery occlusion No date: Cellulitis of left lower extremity No date: Hypercholesteremia No date: Hypertension No date: Long term current use of clopidogrel No date: Long-term use of aspirin therapy No date: Periorbital edema  Comment:  treated with steroids No date: Pre-diabetes No date: Seizures [facility])     Comment:  as teenager, ages [age].  none since 12/25/2018: Sepsis (HCC) 07/23/2015: ST elevation myocardial infarction (STEMI) of inferior  wall (HCC)     Comment:  a.) c/b V.fib arrest requiring defibrillation x 4; b.)               relook LHC/PTCA: 80% pLAD, 100% thrombotic occlusion pRCA              (PTCA performed) --> plans for staged PCI of LAD due to               hemodynamic instability 07/20/2015: ST elevation myocardial infarction (STEMI) of inferior  wall (HCC)     Comment:  a.) LHC/PCI 07/20/2015: 50% dLCx, 100% pRCA (2.25 x 23               mm Xience Alpine DES) No date: Umbilical hernia No date: Uterine fibroid  Reproductive/Obstetrics                             Anesthesia Physical Anesthesia Plan  ASA: 3  Anesthesia Plan: General   Post-op Pain Management: Ofirmev IV (intra-op)*   Induction: Intravenous  PONV Risk Score and Plan: 4 or greater and Ondansetron, Dexamethasone, Midazolam and Treatment may vary due to age or medical condition  Airway Management Planned: Oral ETT and Video Laryngoscope Planned  Additional  Equipment: None  Intra-op Plan:   Post-operative Plan: Extubation in OR  Informed Consent: I have reviewed the patients History and Physical, chart, labs and discussed the procedure including the risks, benefits and alternatives for the proposed anesthesia with the patient or authorized representative who has indicated his/her understanding and acceptance.     Dental advisory given  Plan Discussed with: CRNA and Surgeon  Anesthesia Plan Comments: (Discussed risks of anesthesia with patient, including PONV, sore throat, lip/dental/eye damage. Rare risks discussed as well, such as cardiorespiratory and neurological sequelae, and allergic reactions. Discussed the role of CRNA in patient's perioperative care. Patient understands. Patient informed about increased incidence of above perioperative risk due to high BMI. Patient understands.  )       Anesthesia Quick Evaluation

## 2023-06-07 NOTE — Op Note (Signed)
Patient Name: Sarah Medina, Sarah Medina  YNW:[id number]  Pre-Operative Diagnosis: Right knee Osteoarthritis  Post-Operative Diagnosis: (same)  Procedure: Right Total Knee Arthroplasty  Components/Implants: Femur: Persona Size 7 CR   Tibia: Persona Size F w/ 14x56mm stem extension  Poly: 10mm MC  Patella: 32x8.58mm symmetric  Femoral Valgus Cut Angle: 5 degrees  Distal Femoral Re-cut: none  Patella Resurfacing: yes   Date of Surgery: 06/07/2023  Surgeon: Reinaldo Berber MD  Assistant: Amador Cunas PA (present and scrubbed throughout the case, critical for assistance with exposure, retraction, instrumentation, and closure)   Anesthesiologist: Suzan Slick  Anesthesia: General   Tourniquet Time: 65 min  EBL: 200cc  IVF: 800cc  Complications: None   Brief history: The patient is a 57 year old female with a history of osteoarthritis of the right knee with pain limiting their range of motion and activities of daily living, which has failed multiple attempts at conservative therapy.  The risks and benefits of total knee arthroplasty as definitive surgical treatment were discussed with the patient, who opted to proceed with the operation.  After outpatient medical clearance and optimization was completed the patient was admitted to [facility] for the procedure.  All preoperative films were reviewed and an appropriate surgical plan was made prior to surgery. Preoperative range of motion was 5 to 85 with a 5 flexion contracture. The patient was identified as having a Valgus alignment.   Description of procedure: The patient was brought to the operating room where laterality was confirmed by all those present to be the right side.   Spinal anesthesia was administered and the patient received an intravenous dose of antibiotics for surgical prophylaxis and a dose of tranexamic acid.  Patient is positioned supine on the operating room table with all bony prominences well-padded.  A  well-padded tourniquet was applied to the right thigh.  The knee was then prepped and draped in usual sterile fashion with multiple layers of adhesive and nonadhesive drapes.  All of those present in the operating room participated in a surgical timeout laterality and patient were confirmed.   An Esmarch was wrapped around the extremity and the leg was elevated and the knee flexed.  The tourniquet was inflated to a pressure of 275 mmHg. The Esmarch was removed and the leg was brought down to full extension.  The patella and tibial tubercle identified and outlined using a marking pen and a midline skin incision was made with a knife carried through the subcutaneous tissue down to the extensor retinaculum.  After exposure of the extensor mechanism the medial parapatellar arthrotomy was performed with a scalpel and electrocautery extending down medial and distal to the tibial tubercle taking care to avoid incising the patellar tendon.   A standard medial release was performed over the proximal tibia.  The knee was brought into extension in order to excise the fat pad taking care not to damage the patella tendon.  The superior soft tissue was removed from the anterior surface of the distal femur to visualize for the procedure.  The knee was then brought into flexion with the patella subluxed laterally and subluxing the tibia anteriorly.  The ACL was transected and removed with electrocautery and additional soft tissue was removed from the proximal surface of the tibia to fully expose. The PCL was found to be intact and was preserved.  An extramedullary tibial cutting guide was then applied to the leg with a spring-loaded ankle clamp placed around the distal tibia just above the malleoli the angulation of  the guide was adjusted to give some posterior slope in the tibial resection with an appropriate varus/valgus alignment.  The resection guide was then pinned to the proximal tibia and the proximal tibial surface was  resected with an oscillating saw.  Careful attention was paid to ensure the blade did not disrupt any of the soft tissues including any lateral or medial ligament.  Attention was then turned to the femur, with the knee slightly flexed a opening drill was used to enter the medullary canal of the femur.  After removing the drill marrow was suctioned out to decompress the distal femur.  An intramedullary femoral guide was then inserted into the drill hole and the alignment guide was seated firmly against the distal end of the medial femoral condyle.  The distal femoral cutting guide was then attached and pinned securely to the anterior surface of the femur and the intramedullary rod and alignment guide was removed.  Distal femur resection was then performed with an oscillating saw with retractors protecting medial and laterally.   The distal cutting block was then removed and the extension gap was checked with a spacer.  Extension gap was found to be appropriately sized to accommodate the spacer block.   The femoral sizing guide was then placed securely into the posterior condyles of the femur and the femoral size was measured and determined to be 7.  The size 7; 4-in-1 cutting guide was placed in position and secured with 2 pins.  The anterior posterior and chamfer resections were then performed with an oscillating saw.  Bony fragments and osteophytes were then removed.  Using a lamina spreader the posterior medial and lateral condyles were checked for additional osteophytes and posterior soft tissue remnants.  Any remaining meniscus was removed at this time.  Periarticular injection was performed in the meniscal rims and posterior capsule with aspiration performed to ensure no intravascular injection.   The tibia was then exposed and the tibial trial was pinned onto the plateau after confirming appropriate orientation and rotation.  Using the drill bushing the tibia was prepared to the appropriate drill  depth.  Tibial broach impactor was then driven through the punch guide using a mallet.  The femoral trial component was then inserted onto the femur.  A trial tibial polyethylene bearing was then placed and the knee was reduced.  The knee achieved full extension with no hyperextension and was found to be balanced in flexion and extension with the trials in place.  The knee was then brought into full extension the patella was everted and held with 2 Kocher clamps.  The articular surface of the patella was then resected with an patella reamer and saw after careful measurement with a caliper.  The patella was then prepared with the drill guide and a trial patella was placed.  The knee was then taken through range of motion and it was found that the patella articulated appropriately with the trochlea and good patellofemoral motion without subluxation.    The correct final components for implantation were confirmed and opened by the circulator nurse.  The prepared surfaces of the patella femur and tibia were cleaned with pulsatile lavage to remove all blood fat and other material and then the surfaces were dried.  2 bags of cement were mixed under vacuum and the components were cemented into place.  Excess cement was removed with curettes and forceps. A trial polyethylene tibial component was placed and the knee was brought into extension to allow the cement to  set.  At this time the periarticular injection cocktail was placed in the soft tissues surrounding the knee.  After full curing of the cement the balance of the knee was checked again and the final polyethylene size was confirmed. The tibial component was irrigated and locking mechanism checked to ensure it was clear of debris. The real polyethylene tibial component was implanted and the knee was brought through a range of motion.   The knee was then irrigated with copious amount of normal saline via pulsatile lavage to remove all loose bodies and other debris.   The knee was then irrigated with surgiphor betadine based wash and reirrigated with saline.  The tourniquet was then dropped and all bleeding vessels were identified and coagulated.  The arthrotomy was approximated with #1 Vicryl and closed with #2 stratafix suture.  The knee was brought into slight flexion and the subcutaneous tissues were closed with 0 Vicryl, 2-0 Vicryl and a running subcuticular 4-0 stratafix barbed suture.  Skin was then glued with Dermabond.  A sterile adhesive dressing was then placed along with a sequential compression device to the calf, a Ted stocking, and a cryotherapy cuff.   Sponge, needle, and Lap counts were all correct at the end of the case.   The patient was transferred off of the operating room table to a hospital bed, good pulses were found distally on the operative side.  The patient was transferred to the recovery room in stable condition.

## 2023-06-07 NOTE — Discharge Instructions (Signed)
Instructions after Total Knee Replacement   Reinaldo Berber M.D.     Dept. of Orthopaedics & Sports Medicine  [facility]  [address]  [place], [place]  [address]  Phone: [phone]   Fax: [phone]    DIET: Drink plenty of non-alcoholic fluids. Resume your normal diet. Include foods high in fiber.  ACTIVITY:  You may use crutches or a walker with weight-bearing as tolerated, unless instructed otherwise. You may be weaned off of the walker or crutches by your Physical Therapist.  Do NOT place pillows under the knee. Anything placed under the knee could limit your ability to straighten the knee.   Continue doing gentle exercises. Exercising will reduce the pain and swelling, increase motion, and prevent muscle weakness.   Please continue to use the TED compression stockings for 2 weeks. You may remove the stockings at night, but should reapply them in the morning. Do not drive or operate any equipment until instructed.  WOUND CARE:  Continue to use the PolarCare or ice packs periodically to reduce pain and swelling. You may begin showering 3 days after surgery with honeycomb dressing. Remove honeycomb dressing 7 days after surgery and continue showering. Allow dermabond to fall off on its own.  MEDICATIONS: You may resume your regular medications. Please take the pain medication as prescribed on the medication. Do not take pain medication on an empty stomach. You have been given a prescription for a blood thinner (aspirin). Take aspirin in addition to plavix.  Do not drive or drink alcoholic beverages when taking pain medications.  POSTOPERATIVE CONSTIPATION PROTOCOL Constipation - defined medically as fewer than three stools per week and severe constipation as less than one stool per week.  One of the most common issues patients have following surgery is constipation.  Even if you have a regular bowel pattern at home, your normal regimen is likely to be  disrupted due to multiple reasons following surgery.  Combination of anesthesia, postoperative narcotics, change in appetite and fluid intake all can affect your bowels.  In order to avoid complications following surgery, here are some recommendations in order to help you during your recovery period.  Colace (docusate) - Pick up an over-the-counter form of Colace or another stool softener and take twice a day as long as you are requiring postoperative pain medications.  Take with a full glass of water daily.  If you experience loose stools or diarrhea, hold the colace until you stool forms back up.  If your symptoms do not get better within 1 week or if they get worse, check with your doctor.  Dulcolax (bisacodyl) - Pick up over-the-counter and take as directed by the product packaging as needed to assist with the movement of your bowels.  Take with a full glass of water.  Use this product as needed if not relieved by Colace only.   MiraLax (polyethylene glycol) - Pick up over-the-counter to have on hand.  MiraLax is a solution that will increase the amount of water in your bowels to assist with bowel movements.  Take as directed and can mix with a glass of water, juice, soda, coffee, or tea.  Take if you go more than two days without a movement. Do not use MiraLax more than once per day. Call your doctor if you are still constipated or irregular after using this medication for 7 days in a row.  If you continue to have problems with postoperative constipation, please contact the office for further assistance and  recommendations.  If you experience "the worst abdominal pain ever" or develop nausea or vomiting, please contact the office immediatly for further recommendations for treatment.   CALL THE OFFICE FOR: Temperature above 101 degrees Excessive bleeding or drainage on the dressing. Excessive swelling, coldness, or paleness of the toes. Persistent nausea and vomiting.  FOLLOW-UP:  You should  have an appointment to return to the office in 14 days after surgery. Arrangements have been made for continuation of Physical Therapy (either home therapy or outpatient therapy).

## 2023-06-07 NOTE — Anesthesia Postprocedure Evaluation (Signed)
Anesthesia Post Note  Patient: Sarah Medina  Procedure(s) Performed: TOTAL KNEE ARTHROPLASTY (Right: Knee)  Patient location during evaluation: PACU Anesthesia Type: General Level of consciousness: awake and alert Pain management: pain level controlled Vital Signs Assessment: post-procedure vital signs reviewed and stable Respiratory status: spontaneous breathing, nonlabored ventilation, respiratory function stable and patient connected to nasal cannula oxygen Cardiovascular status: blood pressure returned to baseline and stable Postop Assessment: no apparent nausea or vomiting Anesthetic complications: yes   Encounter Notable Events  Notable Event Outcome Phase Comment  Laryngospasm  Intraprocedure end of procedure, post-extubation. Broken with succinylcholine     Last Vitals:  Vitals:   06/07/23 1435 06/07/23 1440  BP:    Pulse: 64 (!) 56  Resp: 17 (!) 9  Temp:    SpO2: 100% 97%    Last Pain:  Vitals:   06/07/23 1440  TempSrc:   PainSc: Asleep                 Corinda Gubler

## 2023-06-07 NOTE — Plan of Care (Signed)
  Problem: Pain Management: Goal: Pain level will decrease with appropriate interventions Outcome: Progressing   

## 2023-06-07 NOTE — Interval H&P Note (Signed)
Patient history and physical updated. Consent reviewed including risks, benefits, and alternatives to surgery. Patient agrees with above plan to proceed with right total knee arthroplasty. We discussed post operative DVT prophylaxis and plan is to do A81 BID with her plavix for 6 weeks.

## 2023-06-07 NOTE — Anesthesia Procedure Notes (Signed)
Procedure Name: Intubation Date/Time: 06/07/2023 10:35 AM  Performed by: Lily Lovings, CRNAPre-anesthesia Checklist: Patient identified, Patient being monitored, Timeout performed, Emergency Drugs available and Suction available Patient Re-evaluated:Patient Re-evaluated prior to induction Oxygen Delivery Method: Circle system utilized Preoxygenation: Pre-oxygenation with 100% oxygen Induction Type: IV induction Ventilation: Mask ventilation without difficulty Laryngoscope Size: 3 and McGrath Grade View: Grade I Tube type: Oral Tube size: 7.0 mm Number of attempts: 1 Airway Equipment and Method: Stylet Placement Confirmation: ETT inserted through vocal cords under direct vision, positive ETCO2 and breath sounds checked- equal and bilateral Secured at: 20 cm Tube secured with: Tape Dental Injury: Teeth and Oropharynx as per pre-operative assessment

## 2023-06-08 ENCOUNTER — Encounter: Payer: Self-pay | Admitting: Orthopedic Surgery

## 2023-06-08 DIAGNOSIS — Z7982 Long term (current) use of aspirin: Secondary | ICD-10-CM | POA: Diagnosis not present

## 2023-06-08 DIAGNOSIS — Z7902 Long term (current) use of antithrombotics/antiplatelets: Secondary | ICD-10-CM | POA: Diagnosis not present

## 2023-06-08 DIAGNOSIS — I10 Essential (primary) hypertension: Secondary | ICD-10-CM | POA: Diagnosis not present

## 2023-06-08 DIAGNOSIS — I2510 Atherosclerotic heart disease of native coronary artery without angina pectoris: Secondary | ICD-10-CM | POA: Diagnosis not present

## 2023-06-08 DIAGNOSIS — Z79899 Other long term (current) drug therapy: Secondary | ICD-10-CM | POA: Diagnosis not present

## 2023-06-08 DIAGNOSIS — M1711 Unilateral primary osteoarthritis, right knee: Secondary | ICD-10-CM | POA: Diagnosis not present

## 2023-06-08 LAB — BASIC METABOLIC PANEL
Anion gap: 8 (ref 5–15)
BUN: 15 mg/dL (ref 6–20)
CO2: 25 mmol/L (ref 22–32)
Calcium: 8.7 mg/dL — ABNORMAL LOW (ref 8.9–10.3)
Chloride: 105 mmol/L (ref 98–111)
Creatinine, Ser: 0.93 mg/dL (ref 0.44–1.00)
GFR, Estimated: >60 mL/min (ref >60)
Glucose, Bld: 114 mg/dL — ABNORMAL HIGH (ref 70–99)
Potassium: 4.3 mmol/L (ref 3.5–5.1)
Sodium: 138 mmol/L (ref 135–145)

## 2023-06-08 LAB — CBC
HCT: 34.9 % — ABNORMAL LOW (ref 36.0–46.0)
Hemoglobin: 11.2 g/dL — ABNORMAL LOW (ref 12.0–15.0)
MCH: 27.9 pg (ref 26.0–34.0)
MCHC: 32.1 g/dL (ref 30.0–36.0)
MCV: 87 fL (ref 80.0–100.0)
Platelets: 236 10*3/uL (ref 150–400)
RBC: 4.01 MIL/uL (ref 3.87–5.11)
RDW: 15.1 % (ref 11.5–15.5)
WBC: 10.8 10*3/uL — ABNORMAL HIGH (ref 4.0–10.5)
nRBC: 0 % (ref 0.0–0.2)

## 2023-06-08 MED ORDER — OXYCODONE HCL 5 MG PO TABS: 2.5000 mg | ORAL_TABLET | Freq: Four times a day (QID) | ORAL | 0 refills | Status: DC | PRN

## 2023-06-08 MED ORDER — TRAMADOL HCL 50 MG PO TABS: 50.0000 mg | ORAL_TABLET | Freq: Four times a day (QID) | ORAL | 0 refills | Status: DC | PRN

## 2023-06-08 MED ORDER — ASPIRIN EC 81 MG PO TBEC: 81.0000 mg | DELAYED_RELEASE_TABLET | Freq: Two times a day (BID) | ORAL | 11 refills | Status: AC

## 2023-06-08 MED ORDER — FERROUS SULFATE 325 (65 FE) MG PO TABS
ORAL_TABLET | ORAL | Status: AC
  Filled 2023-06-08: qty 1

## 2023-06-08 MED ORDER — ONDANSETRON HCL 4 MG PO TABS: 4.0000 mg | ORAL_TABLET | Freq: Four times a day (QID) | ORAL | 0 refills | Status: DC | PRN

## 2023-06-08 MED ORDER — ASPIRIN 325 MG PO TBEC
DELAYED_RELEASE_TABLET | ORAL | Status: AC
  Filled 2023-06-08: qty 1

## 2023-06-08 MED ORDER — ASPIRIN 81 MG PO CHEW
CHEWABLE_TABLET | ORAL | Status: AC
  Filled 2023-06-08: qty 1

## 2023-06-08 MED ORDER — FUROSEMIDE 20 MG PO TABS
ORAL_TABLET | ORAL | Status: AC
  Filled 2023-06-08: qty 1

## 2023-06-08 MED ORDER — LOSARTAN POTASSIUM 50 MG PO TABS
ORAL_TABLET | ORAL | Status: AC
  Filled 2023-06-08: qty 1

## 2023-06-08 MED ORDER — OXYCODONE HCL 5 MG PO TABS
ORAL_TABLET | ORAL | Status: AC
  Filled 2023-06-08: qty 1

## 2023-06-08 MED ORDER — CELECOXIB 100 MG PO CAPS: 100.0000 mg | ORAL_CAPSULE | Freq: Two times a day (BID) | ORAL | 0 refills | Status: AC

## 2023-06-08 MED ORDER — DOCUSATE SODIUM 100 MG PO CAPS
ORAL_CAPSULE | ORAL | Status: AC
  Filled 2023-06-08: qty 1

## 2023-06-08 MED ORDER — ACETAMINOPHEN 500 MG PO TABS
ORAL_TABLET | ORAL | Status: AC
  Filled 2023-06-08: qty 2

## 2023-06-08 MED ORDER — CLOPIDOGREL BISULFATE 75 MG PO TABS
ORAL_TABLET | ORAL | Status: AC
  Filled 2023-06-08: qty 1

## 2023-06-08 MED ORDER — METOPROLOL TARTRATE 25 MG PO TABS
ORAL_TABLET | ORAL | Status: AC
  Filled 2023-06-08: qty 1

## 2023-06-08 MED ORDER — DOCUSATE SODIUM 100 MG PO CAPS: 100.0000 mg | ORAL_CAPSULE | Freq: Two times a day (BID) | ORAL | 0 refills | Status: DC

## 2023-06-08 MED ORDER — CEFAZOLIN SODIUM-DEXTROSE 2-4 GM/100ML-% IV SOLN
INTRAVENOUS | Status: AC
  Filled 2023-06-08: qty 100

## 2023-06-08 MED ORDER — PANTOPRAZOLE SODIUM 40 MG PO TBEC
DELAYED_RELEASE_TABLET | ORAL | Status: AC
  Filled 2023-06-08: qty 1

## 2023-06-08 NOTE — Progress Notes (Signed)
Physical Therapy Treatment Patient Details Name: Sarah Medina MRN: [id number] DOB: 1966-01-16 Today's Date: 06/08/2023   History of Present Illness Pt is 57 y/o admitted 06/07/23 for right total knee replacement. Right Total Knee Arthroplasty procedure dated 06/07/23.    PT Comments  Pt was premedicated prior to BID/PM session. She was asleep upon entry but easily awakes. Supportive spouse at bedside. Pt endorses 10/10 pain. Unwilling to perform more than just getting back to bed from recliner. Encouraged pt to attempt further ambulation and exercises to promote ROM and strength improvements however pt unwilling due to pain. MD aware of pain complaints and limitations. Acute PT will continue to follow and progress as able. Pt will benefit from continued skilled PT to maximize her independence and safety with all ADLs while decreasing caregiver burden.    If plan is discharge home, recommend the following: A little help with walking and/or transfers;A little help with bathing/dressing/bathroom;Assistance with cooking/housework;Direct supervision/assist for medications management;Direct supervision/assist for financial management;Assist for transportation;Help with stairs or ramp for entrance     Equipment Recommendations  BSC/3in1       Precautions / Restrictions Precautions Precautions: Knee Precaution Booklet Issued: Yes (comment) Precaution Comments: HEP issued to patient Restrictions Weight Bearing Restrictions: Yes RLE Weight Bearing: Weight bearing as tolerated     Mobility  Bed Mobility Overal bed mobility: Needs Assistance Bed Mobility: Supine to Sit, Sit to Supine  Supine to sit: Supervision, HOB elevated Sit to supine: Min assist General bed mobility comments: Min assist to progress LEs back into bed from EOB short sitting    Transfers Overall transfer level: Needs assistance Equipment used: Rolling walker (2 wheels) Transfers: Sit to/from Stand Sit to Stand: Contact  guard assist  General transfer comment: CGA assist to stand from recliner with vcs for fwd wt shift and handplacement. pt demonstrated strength requird to perform task but remains overal slow/limited by pain    Ambulation/Gait Ambulation/Gait assistance: Supervision Gait Distance (Feet): 3 Feet Assistive device: Rolling walker (2 wheels) Gait Pattern/deviations: Step-to pattern, Antalgic Gait velocity: extremely slow  General Gait Details: Pt unwilling to attempt further ambulation than from recliner > bed.    Balance Overall balance assessment: Needs assistance Sitting-balance support: Feet supported Sitting balance-Leahy Scale: Good     Standing balance support: Bilateral upper extremity supported, During functional activity, Reliant on assistive device for balance Standing balance-Leahy Scale: Good Standing balance comment: no LOB with use of RW       Cognition Arousal: Alert Behavior During Therapy: WFL for tasks assessed/performed Overall Cognitive Status: Within Functional Limits for tasks assessed    General Comments: Pt was asleep upon arrival. Easily awakes however then endorses 10/10 pain. Unwilling to attempt ambulation but only willing to get back into bed from recliner.           General Comments General comments (skin integrity, edema, etc.): pt unwilling to per ROM or exercises due to pain. " I cant do them right now, it hurts too bad."      Pertinent Vitals/Pain Pain Assessment Pain Assessment: 0-10 Pain Score: 10-Worst pain ever Pain Location: knee Pain Descriptors / Indicators: Moaning, Grimacing, Discomfort Pain Intervention(s): Limited activity within patient's tolerance, Monitored during session, Premedicated before session, Repositioned, Ice applied     PT Goals (current goals can now be found in the care plan section) Acute Rehab PT Goals Patient Stated Goal: Less pain Progress towards PT goals: Progressing toward goals    Frequency     BID  AM-PAC PT "6 Clicks" Mobility   Outcome Measure  Help needed turning from your back to your side while in a flat bed without using bedrails?: A Little Help needed moving from lying on your back to sitting on the side of a flat bed without using bedrails?: A Little Help needed moving to and from a bed to a chair (including a wheelchair)?: A Little Help needed standing up from a chair using your arms (e.g., wheelchair or bedside chair)?: A Little Help needed to walk in hospital room?: A Little Help needed climbing 3-5 steps with a railing? : A Little 6 Click Score: 18    End of Session   Activity Tolerance: Patient tolerated treatment well Patient left: with family/visitor present;in bed;with bed alarm set;with call bell/phone within reach Nurse Communication: Mobility status PT Visit Diagnosis: Other abnormalities of gait and mobility (R26.89);Muscle weakness (generalized) (M62.81);Difficulty in walking, not elsewhere classified (R26.2);Pain Pain - Right/Left: Right Pain - part of body: Knee     Time: 1205-1225 PT Time Calculation (min) (ACUTE ONLY): 20 min  Charges:    $Therapeutic Activity: 8-22 mins PT General Charges $$ ACUTE PT VISIT: 1 Visit                    Jetta Lout PTA 06/08/23, 2:13 PM

## 2023-06-08 NOTE — Progress Notes (Signed)
Subjective: 1 Day Post-Op Procedure(s) (LRB): TOTAL KNEE ARTHROPLASTY (Right) Patient reports pain as mild.   Patient is well, and has had no acute complaints or problems Denies any CP, SOB, ABD pain. We will continue therapy today.  Plan is to go Home after hospital stay.  Objective: Vital signs in last 24 hours: Temp:  [96.9 F (36.1 C)-99.5 F (37.5 C)] 98.2 F (36.8 C) (12/06 0743) Pulse Rate:  [50-79] 51 (12/06 0743) Resp:  [0-21] 15 (12/06 0743) BP: (121-155)/(49-100) 125/65 (12/06 0743) SpO2:  [92 %-100 %] 98 % (12/06 0743) Weight:  [113.4 kg] 113.4 kg (12/05 0923)  Intake/Output from previous day: 12/05 0701 - 12/06 0700 In: 1424 [I.V.:1024; IV Piggyback:400] Out: 500 [Urine:300; Blood:200] Intake/Output this shift: No intake/output data recorded.  No results for input(s): "HGB" in the last 72 hours. No results for input(s): "WBC", "RBC", "HCT", "PLT" in the last 72 hours. Recent Labs    06/08/23 0649  NA 138  K 4.3  CL 105  CO2 25  BUN 15  CREATININE 0.93  GLUCOSE 114*  CALCIUM 8.7*   No results for input(s): "LABPT", "INR" in the last 72 hours.  EXAM General - Patient is Alert, Appropriate, and Oriented Extremity - Neurovascular intact Sensation intact distally Intact pulses distally Dorsiflexion/Plantar flexion intact No cellulitis present Compartment soft Dressing - dressing C/D/I and no drainage Motor Function - intact, moving foot and toes well on exam.   Past Medical History:  Diagnosis Date   Anemia    Anginal pain (HCC)    Arthritis    CAD in native artery 07/20/2015   a.) LHC/PCI: 50% dLCx, 100% pRCA (2.25 x 23 mm Xience Alpine DES); b.) LHC/PTCA 07/23/2015: 80% pLAD, 100% thrombotic occlusion of pRCA stent (PTCA). Due to hemodynamic instability, PCI of LAD planed for later date; c.) LHC/PCI 07/12/2016: 50% LM, 70% mRCA, 80% pLAD (3.25 x 18 mm Xience Alpine DES); d.) MV 09/20/2018: no ischemia; e.) MV 02/08/2021: no ischemia; f.) MV  05/17/2023: no ischemia   Cardiac arrest with ventricular fibrillation (HCC) 07/23/2015   a.) developed in setting of inferior STEMI when patient was transferred from stretcher to cath lab table --> required defibrillation x 4. (+) hemodynamic instability with SBPs in the 70s. Started on dopamine and amiodarone. Plans for further staged PCI at a later date.   Carotid artery occlusion    Cellulitis of left lower extremity    Hypercholesteremia    Hypertension    Long term current use of clopidogrel    Long-term use of aspirin therapy    Periorbital edema    treated with steroids   Pre-diabetes    Seizures (HCC)    as teenager, ages [age].  none since   Sepsis (HCC) 12/25/2018   ST elevation myocardial infarction (STEMI) of inferior wall (HCC) 07/23/2015   a.) c/b V.fib arrest requiring defibrillation x 4; b.) relook LHC/PTCA: 80% pLAD, 100% thrombotic occlusion pRCA (PTCA performed) --> plans for staged PCI of LAD due to hemodynamic instability   ST elevation myocardial infarction (STEMI) of inferior wall (HCC) 07/20/2015   a.) LHC/PCI 07/20/2015: 50% dLCx, 100% pRCA (2.25 x 23 mm Xience Alpine DES)   Umbilical hernia    Uterine fibroid     Assessment/Plan:   1 Day Post-Op Procedure(s) (LRB): TOTAL KNEE ARTHROPLASTY (Right) Principal Problem:   S/P TKR (total knee replacement) using cement, right  Estimated body mass index is 40.35 kg/m as calculated from the following:   Height as of  this encounter: 5\' 6"  (1.676 m).   Weight as of this encounter: 113.4 kg. Advance diet Up with therapy Pain well-controlled Labs and vital signs are stable Patient doing well this morning ambulatory to the bathroom with walker.  Anticipate discharge to home with home health PT today pending safe completion of PT goals.  DVT Prophylaxis - Aspirin, TED hose, and Plavix Weight-Bearing as tolerated to right leg   T. Cranston Neighbor, PA-C [facility] 06/08/2023, 8:05 AM

## 2023-06-08 NOTE — Discharge Summary (Signed)
Physician Discharge Summary  Patient ID: Sarah Medina MRN: [id number] DOB/AGE: [birth year] 57 y.o.  Admit date: 06/07/2023 Discharge date: 06/09/2023  Admission Diagnoses:  S/P TKR (total knee replacement) using cement, right [Z96.651]   Discharge Diagnoses: Patient Active Problem List   Diagnosis Date Noted   S/P TKR (total knee replacement) using cement, right 06/07/2023   PAD (peripheral artery disease) (HCC) 01/09/2023   Vitamin D deficiency, unspecified 01/09/2023   B12 deficiency due to diet 01/09/2023   Prediabetes 01/09/2023   Strain of knee 09/09/2021   PMB (postmenopausal bleeding) 08/08/2019   Pain in limb 08/08/2019   Severe obesity with body mass index (BMI) of 35.0 to 39.9 with comorbidity (HCC) 05/02/2019   Peripheral edema 02/19/2019   Leg swelling    Ischemic heart disease 10/20/2015   Essential hypertension 10/20/2015   Hyperlipidemia 10/20/2015   Presence of coronary angioplasty implant and graft 08/02/2015   Acute posthemorrhagic anemia    Coronary artery disease involving native heart with angina pectoris (HCC) 07/23/2015   Abnormal LFTs 12/23/2014   Osteoarthritis of right knee 11/24/2014    Past Medical History:  Diagnosis Date   Anemia    Anginal pain (HCC)    Arthritis    CAD in native artery 07/20/2015   a.) LHC/PCI: 50% dLCx, 100% pRCA (2.25 x 23 mm Xience Alpine DES); b.) LHC/PTCA 07/23/2015: 80% pLAD, 100% thrombotic occlusion of pRCA stent (PTCA). Due to hemodynamic instability, PCI of LAD planed for later date; c.) LHC/PCI 07/12/2016: 50% LM, 70% mRCA, 80% pLAD (3.25 x 18 mm Xience Alpine DES); d.) MV 09/20/2018: no ischemia; e.) MV 02/08/2021: no ischemia; f.) MV 05/17/2023: no ischemia   Cardiac arrest with ventricular fibrillation (HCC) 07/23/2015   a.) developed in setting of inferior STEMI when patient was transferred from stretcher to cath lab table --> required defibrillation x 4. (+) hemodynamic instability with SBPs in the 70s.  Started on dopamine and amiodarone. Plans for further staged PCI at a later date.   Carotid artery occlusion    Cellulitis of left lower extremity    Hypercholesteremia    Hypertension    Long term current use of clopidogrel    Long-term use of aspirin therapy    Periorbital edema    treated with steroids   Pre-diabetes    Seizures (HCC)    as teenager, ages [age].  none since   Sepsis (HCC) 12/25/2018   ST elevation myocardial infarction (STEMI) of inferior wall (HCC) 07/23/2015   a.) c/b V.fib arrest requiring defibrillation x 4; b.) relook LHC/PTCA: 80% pLAD, 100% thrombotic occlusion pRCA (PTCA performed) --> plans for staged PCI of LAD due to hemodynamic instability   ST elevation myocardial infarction (STEMI) of inferior wall (HCC) 07/20/2015   a.) LHC/PCI 07/20/2015: 50% dLCx, 100% pRCA (2.25 x 23 mm Xience Alpine DES)   Umbilical hernia    Uterine fibroid      Transfusion: none   Consultants (if any):   Discharged Condition: Improved  Hospital Course: Sarah Medina is an 57 y.o. female who was admitted 06/07/2023 with a diagnosis of S/P TKR (total knee replacement) using cement, right and went to the operating room on 06/07/2023 and underwent the above named procedures.    Surgeries: Procedure(s): TOTAL KNEE ARTHROPLASTY on 06/07/2023 Patient tolerated the surgery well. Taken to PACU where she was stabilized and then transferred to the orthopedic floor.  Started on aspirin, Plavix TEDs and SCDs applied bilaterally. Heels elevated on bed. No evidence of DVT.  Negative Homan. Physical therapy started on day #1 for gait training and transfer. OT started day #1 for ADL and assisted devices.  Patient's IV was d/c on day #1. Patient was able to safely and independently complete all PT goals. PT recommending discharge to home.  Patient stayed overnight for continued pain management.  On post op day #2 patient was stable and ready for discharge to home with home health  PT.  Implants: Femur: Persona Size 7 CR   Tibia: Persona Size F w/ 14x2mm stem extension  Poly: 10mm MC  Patella: 32x8.24mm symmetric    She was given perioperative antibiotics:  Anti-infectives (From admission, onward)    Start     Dose/Rate Route Frequency Ordered Stop   06/07/23 1830  ceFAZolin (ANCEF) IVPB 2g/100 mL premix        2 g 200 mL/hr over 30 Minutes Intravenous Every 6 hours 06/07/23 1815 06/08/23 0131   06/07/23 0600  ceFAZolin (ANCEF) IVPB 2g/100 mL premix        2 g 200 mL/hr over 30 Minutes Intravenous On call to O.R. 06/06/23 2225 06/07/23 1114     .  She was given sequential compression devices, early ambulation, and aspirin Plavix teds for DVT prophylaxis.  She benefited maximally from the hospital stay and there were no complications.    Recent vital signs:  Vitals:   06/08/23 2316 06/09/23 0746  BP: (!) 103/46 (!) 116/59  Pulse: 78 65  Resp: 16 18  Temp: 98 F (36.7 C) 98.9 F (37.2 C)  SpO2: 98% 98%    Recent laboratory studies:  Lab Results  Component Value Date   HGB 10.1 (L) 06/09/2023   HGB 11.2 (L) 06/08/2023   HGB 14.7 05/15/2023   Lab Results  Component Value Date   WBC 9.2 06/09/2023   PLT 213 06/09/2023   Lab Results  Component Value Date   INR 1.1 12/26/2018   Lab Results  Component Value Date   NA 138 06/08/2023   K 4.3 06/08/2023   CL 105 06/08/2023   CO2 25 06/08/2023   BUN 15 06/08/2023   CREATININE 0.93 06/08/2023   GLUCOSE 114 (H) 06/08/2023    Discharge Medications:   Allergies as of 06/09/2023       Reactions   Enalapril Swelling   Swelling face        Medication List     TAKE these medications    acetaminophen 500 MG tablet Commonly known as: TYLENOL Take 1,000 mg by mouth every 6 (six) hours as needed.   aspirin EC 81 MG tablet Take 1 tablet (81 mg total) by mouth 2 (two) times daily. What changed: when to take this   atorvastatin 80 MG tablet Commonly known as: LIPITOR Take 1 tablet (80  mg total) by mouth at bedtime.   celecoxib 100 MG capsule Commonly known as: CeleBREX Take 1 capsule (100 mg total) by mouth 2 (two) times daily for 7 days.   cholecalciferol 25 MCG (1000 UNIT) tablet Commonly known as: VITAMIN D3 Take 1,000 Units by mouth daily.   clopidogrel 75 MG tablet Commonly known as: PLAVIX Take 75 mg by mouth daily.   docusate sodium 100 MG capsule Commonly known as: COLACE Take 1 capsule (100 mg total) by mouth 2 (two) times daily.   ferrous sulfate 325 (65 FE) MG tablet Take 325 mg by mouth daily.   fluconazole 150 MG tablet Commonly known as: Diflucan Take 1 tablet (150 mg) PO x 1 dose.  May repeat 150 mg dose in 3 days if still symptomatic.   furosemide 20 MG tablet Commonly known as: LASIX TAKE 2 TABLETS(40 MG) BY MOUTH DAILY   losartan 50 MG tablet Commonly known as: COZAAR TAKE 1 TABLET BY MOUTH EVERY DAY   metoprolol tartrate 25 MG tablet Commonly known as: LOPRESSOR Take 0.5 tablets (12.5 mg total) by mouth 2 (two) times daily.   nitroGLYCERIN 0.4 MG SL tablet Commonly known as: NITROSTAT Place 0.4 mg under the tongue every 5 (five) minutes as needed for chest pain.   ondansetron 4 MG tablet Commonly known as: ZOFRAN Take 1 tablet (4 mg total) by mouth every 6 (six) hours as needed for nausea.   oxyCODONE 5 MG immediate release tablet Commonly known as: Oxy IR/ROXICODONE Take 0.5 tablets (2.5 mg total) by mouth every 6 (six) hours as needed for breakthrough pain.   traMADol 50 MG tablet Commonly known as: ULTRAM Take 1 tablet (50 mg total) by mouth every 6 (six) hours as needed for moderate pain (pain score 4-6).               Durable Medical Equipment  (From admission, onward)           Start     Ordered   06/08/23 1043  For home use only DME Bedside commode  Once       Question:  Patient needs a bedside commode to treat with the following condition  Answer:  Impaired mobility   06/08/23 1042             Diagnostic Studies: DG Knee 1-2 Views Right  Result Date: 06/07/2023 CLINICAL DATA:  Postop total knee arthroplasty. EXAM: RIGHT KNEE - 1-2 VIEW COMPARISON:  Radiographs 12/11/2016. FINDINGS: Interval right total knee arthroplasty. The hardware is well positioned. No evidence of acute fracture or dislocation. There is a small amount of gas within the joint and anterior soft tissues. No unexpected foreign body. IMPRESSION: Interval right total knee arthroplasty without complicating features. Electronically Signed   By: Carey Bullocks M.D.   On: 06/07/2023 17:05   NM Myocar Multi W/Spect W/Wall Motion / EF  Result Date: 05/17/2023   The study is normal. The study is low risk.   No ST deviation was noted.   LV perfusion is normal.   Left ventricular function is normal. End diastolic cavity size is normal. End systolic cavity size is normal.    Disposition:      Follow-up Information     Evon Slack, PA-C Follow up in 2 week(s).   Specialties: Orthopedic Surgery, Emergency Medicine Contact information: [address] [place] [address] [phone]                  Signed: Patience Musca 06/09/2023, 8:34 AM

## 2023-06-08 NOTE — Progress Notes (Cosign Needed)
Patient is not able to walk the distance required to go the bathroom, or he/she is unable to safely negotiate stairs required to access the bathroom.  A 3in1 BSC will alleviate this problem  

## 2023-06-08 NOTE — Progress Notes (Signed)
Patient was transferred to [place] . Report given to Laura,RN. Questions answered.

## 2023-06-08 NOTE — Progress Notes (Signed)
Physical Therapy Treatment Patient Details Name: Sarah Medina MRN: [id number] DOB: March 29, 1966 Today's Date: 06/08/2023   History of Present Illness Pt is 56 y/o admitted 06/07/23 for right total knee replacement. Right Total Knee Arthroplasty procedure dated 06/07/23.    PT Comments  Pt was long sitting in bed with supportive spouse at bedside. A and O x 4. She endorses 8/10 pain however was premedicated ~ 1.5 hours prior. Pt agrees to session however author questions effort throughout. Pain limited during limited session. Pt was able to exit bed, stand to RW several times prior to ambulating ~ 30 ft with slow, antalgic, step too gait sequencing. Discussed at length the importance of ROM, strengthening, routine mobility. Both pt and pt's spouse state understanding. Issued HEP handout however pt reports too much pain to perform at this time. Author educated pt's spouse on car transfers, polar care use, and importance of position. MD/PA made aware of pt's abilities. Acute PT will continue to follow per current POC. Pt will benefit form continued skilled PT at DC to maximize her independence and safety with all ADLs.    If plan is discharge home, recommend the following: A little help with walking and/or transfers;Assist for transportation;Help with stairs or ramp for entrance     Equipment Recommendations  BSC/3in1       Precautions / Restrictions Precautions Precautions: Knee Precaution Booklet Issued: Yes (comment) Precaution Comments: HEP issued to patient Restrictions Weight Bearing Restrictions: Yes RLE Weight Bearing: Weight bearing as tolerated     Mobility  Bed Mobility Overal bed mobility: Needs Assistance Bed Mobility: Supine to Sit  Supine to sit: Supervision, HOB elevated  General bed mobility comments: Pt was able to achieve EOB sitting form long sitting with increased time and vcs. Author had pt use sheet to assist RLE to EOB with UE assist. min assist overall to achieve  EOB sitting. pt sat EOB to take morning meds but wa spremedicated ~ 1.5 hours prior    Transfers Overall transfer level: Needs assistance Equipment used: Rolling walker (2 wheels) Transfers: Sit to/from Stand Sit to Stand: Min assist, Contact guard assist  General transfer comment: Pt performed EOB sit to stand ~ 3 times prior to ambulation. Min assist at first however CGA for 2nd/3rd attempt. Pt demionstrated good strength but is limited by pain    Ambulation/Gait Ambulation/Gait assistance: Contact guard assist, Supervision Gait Distance (Feet): 30 Feet Assistive device: Rolling walker (2 wheels) Gait Pattern/deviations: Step-to pattern, Shuffle, Antalgic, Trunk flexed Gait velocity: extremely slow  General Gait Details: pt ambulated a total of ~ 30 ft with extremely slow, antalgic, steo to gait sequencing. Poor wt acceptance on RLE during LLE swing   Balance Overall balance assessment: Needs assistance Sitting-balance support: Feet supported Sitting balance-Leahy Scale: Good     Standing balance support: Bilateral upper extremity supported, During functional activity, Reliant on assistive device for balance Standing balance-Leahy Scale: Good Standing balance comment: no LOB with use of RW       Cognition Arousal: Alert Behavior During Therapy: WFL for tasks assessed/performed Overall Cognitive Status: Within Functional Limits for tasks assessed      General Comments: Pt is severely pain limited. premedicated prior to session but pt still endorses severe pain throughout session           General Comments General comments (skin integrity, edema, etc.): Discussed car transfers, importance of HEP, discussed post acute PT and expectations going forward. MD/PA notified of pt's abilities and pain limitations. gait blt  given to spouse with education on polar care use.      Pertinent Vitals/Pain Pain Assessment Pain Assessment: 0-10 Pain Score: 8  Pain Descriptors /  Indicators: Moaning, Grimacing, Discomfort Pain Intervention(s): Limited activity within patient's tolerance, Monitored during session, Premedicated before session, Repositioned, Ice applied     PT Goals (current goals can now be found in the care plan section) Acute Rehab PT Goals Patient Stated Goal: stay one more night before going home Progress towards PT goals: Progressing toward goals    Frequency    BID       AM-PAC PT "6 Clicks" Mobility   Outcome Measure  Help needed turning from your back to your side while in a flat bed without using bedrails?: A Little Help needed moving from lying on your back to sitting on the side of a flat bed without using bedrails?: A Little Help needed moving to and from a bed to a chair (including a wheelchair)?: A Little Help needed standing up from a chair using your arms (e.g., wheelchair or bedside chair)?: A Little Help needed to walk in hospital room?: A Little Help needed climbing 3-5 steps with a railing? : A Lot 6 Click Score: 17    End of Session Equipment Utilized During Treatment: Gait belt Activity Tolerance: Patient limited by pain Patient left: in chair;with call bell/phone within reach;with nursing/sitter in room;with family/visitor present Nurse Communication: Mobility status PT Visit Diagnosis: Other abnormalities of gait and mobility (R26.89);Muscle weakness (generalized) (M62.81);Difficulty in walking, not elsewhere classified (R26.2);Pain Pain - Right/Left: Right Pain - part of body: Knee     Time: 0928-0959 PT Time Calculation (min) (ACUTE ONLY): 31 min  Charges:    $Gait Training: 8-22 mins $Therapeutic Activity: 8-22 mins PT General Charges $$ ACUTE PT VISIT: 1 Visit                     Jetta Lout PTA 06/08/23, 10:11 AM

## 2023-06-08 NOTE — TOC Initial Note (Signed)
Transition of Care [facility]) - Initial/Assessment Note    Patient Details  Name: Sarah Medina MRN: [id number] Date of Birth: 12-30-1965  [facility]) CM/SW Contact:    Marlowe Sax, RN Phone Number: 06/08/2023, 9:56 AM  Clinical Narrative:                 The patient is set up with [facility] prior to surgery by surgeons office [facility] to deliver a 3 in 1 to the bedside prior to DC  Expected Discharge Plan: Home w Home Health Services Barriers to Discharge: No Barriers Identified   Patient Goals and CMS Choice            Expected Discharge Plan and Services   Discharge Planning Services: CM Consult   Living arrangements for the past 2 months: Single Family Home                 DME Arranged: 3-N-1 DME Agency: [facility] Date DME Agency Contacted: 06/08/23 Time DME Agency Contacted: [phone] Representative spoke with at DME Agency: Cletis [place] HH Arranged: PT, OT [facility] Agency: [facility] Date HH Agency Contacted: 06/08/23 Time HH Agency Contacted: [phone] Representative spoke with at [facility] Agency: Adele Dan  Prior Living Arrangements/Services Living arrangements for the past 2 months: Single Family Home Lives with:: Spouse Patient language and need for interpreter reviewed:: Yes Do you feel safe going back to the place where you live?: Yes      Need for Family Participation in Patient Care: Yes (Comment) Care giver support system in place?: Yes (comment)   Criminal Activity/Legal Involvement Pertinent to Current Situation/Hospitalization: No - Comment as needed  Activities of Daily Living   ADL Screening (condition at time of admission) Independently performs ADLs?: Yes (appropriate for developmental age) Is the patient deaf or have difficulty hearing?: No Does the patient have difficulty seeing, even when wearing glasses/contacts?: No Does the patient have difficulty concentrating, remembering, or making decisions?: No  Permission  Sought/Granted   Permission granted to share information with : Yes, Verbal Permission Granted              Emotional Assessment Appearance:: Appears stated age Attitude/Demeanor/Rapport: Engaged Affect (typically observed): Accepting Orientation: : Oriented to Self, Oriented to Place, Oriented to  Time, Oriented to Situation Alcohol / Substance Use: Not Applicable Psych Involvement: No (comment)  Admission diagnosis:  S/P TKR (total knee replacement) using cement, right [Z96.651] Patient Active Problem List   Diagnosis Date Noted   S/P TKR (total knee replacement) using cement, right 06/07/2023   PAD (peripheral artery disease) (HCC) 01/09/2023   Vitamin D deficiency, unspecified 01/09/2023   B12 deficiency due to diet 01/09/2023   Prediabetes 01/09/2023   Strain of knee 09/09/2021   PMB (postmenopausal bleeding) 08/08/2019   Pain in limb 08/08/2019   Severe obesity with body mass index (BMI) of 35.0 to 39.9 with comorbidity (HCC) 05/02/2019   Peripheral edema 02/19/2019   Leg swelling    Ischemic heart disease 10/20/2015   Essential hypertension 10/20/2015   Hyperlipidemia 10/20/2015   Presence of coronary angioplasty implant and graft 08/02/2015   Acute posthemorrhagic anemia    Coronary artery disease involving native heart with angina pectoris (HCC) 07/23/2015   Abnormal LFTs 12/23/2014   Osteoarthritis of right knee 11/24/2014   PCP:  Miki Kins, FNP Pharmacy:   [id number] - [place], [place] - [address] AT [facility] OF [address] [place] [address] Phone: [phone] Fax: [phone]     Social Determinants of Health (SDOH) Social History: SDOH Screenings   Food Insecurity: No Food Insecurity (06/07/2023)  Housing: Low Risk  (06/07/2023)  Recent Concern: Housing - Medium Risk (05/15/2023)  Transportation Needs: No Transportation Needs (06/07/2023)  Utilities: Not At Risk (06/07/2023)  Alcohol Screen: Low  Risk  (05/15/2023)  Depression (PHQ2-9): Low Risk  (05/15/2023)  Financial Resource Strain: Low Risk  (05/15/2023)  Physical Activity: Inactive (05/15/2023)  Social Connections: Socially Integrated (05/15/2023)  Stress: No Stress Concern Present (05/15/2023)  Tobacco Use: Medium Risk (06/07/2023)  Health Literacy: Adequate Health Literacy (05/15/2023)   SDOH Interventions:     Readmission Risk Interventions     No data to display

## 2023-06-09 DIAGNOSIS — I10 Essential (primary) hypertension: Secondary | ICD-10-CM | POA: Diagnosis not present

## 2023-06-09 DIAGNOSIS — I2510 Atherosclerotic heart disease of native coronary artery without angina pectoris: Secondary | ICD-10-CM | POA: Diagnosis not present

## 2023-06-09 DIAGNOSIS — Z79899 Other long term (current) drug therapy: Secondary | ICD-10-CM | POA: Diagnosis not present

## 2023-06-09 DIAGNOSIS — Z7982 Long term (current) use of aspirin: Secondary | ICD-10-CM | POA: Diagnosis not present

## 2023-06-09 DIAGNOSIS — M1711 Unilateral primary osteoarthritis, right knee: Secondary | ICD-10-CM | POA: Diagnosis not present

## 2023-06-09 DIAGNOSIS — Z7902 Long term (current) use of antithrombotics/antiplatelets: Secondary | ICD-10-CM | POA: Diagnosis not present

## 2023-06-09 LAB — CBC
HCT: 31.7 % — ABNORMAL LOW (ref 36.0–46.0)
Hemoglobin: 10.1 g/dL — ABNORMAL LOW (ref 12.0–15.0)
MCH: 28 pg (ref 26.0–34.0)
MCHC: 31.9 g/dL (ref 30.0–36.0)
MCV: 87.8 fL (ref 80.0–100.0)
Platelets: 213 10*3/uL (ref 150–400)
RBC: 3.61 MIL/uL — ABNORMAL LOW (ref 3.87–5.11)
RDW: 15.4 % (ref 11.5–15.5)
WBC: 9.2 10*3/uL (ref 4.0–10.5)
nRBC: 0 % (ref 0.0–0.2)

## 2023-06-09 NOTE — TOC Transition Note (Addendum)
Transition of Care [facility]) - CM/SW Discharge Note   Patient Details  Name: Sarah Medina MRN: [id number] Date of Birth: [birth year]  Transition of Care [facility]) CM/SW Contact:  Bing Quarry, RN Phone Number: 06/09/2023, 11:45 AM   Clinical Narrative:   06/09/23: Discharge order in for today. Patient was set up with [facility] for PT/OT and a DME BSC was ordered yesterday to be delivered to room prior to discharge. Checking with Unit RN to confirmed DME and was confirmed. NO SDOH interventions indicated on assessment. Patient has PCP/insurance.   Gabriel Cirri MSN RN CM  Care Management Department.  [place]  [facility] Direct Dial: [phone] Main Office Phone: [phone] Weekends Only      Final next level of care: Home w Home Health Services Barriers to Discharge: Barriers Resolved   Patient Goals and CMS Choice      Discharge Placement                         Discharge Plan and Services Additional resources added to the After Visit Summary for     Discharge Planning Services: CM Consult            DME Arranged: 3-N-1 DME Agency: [employer] Date DME Agency Contacted: 06/08/23 Time DME Agency Contacted: [phone] Representative spoke with at DME Agency: Cletis [place] HH Arranged: PT, OT [facility] Agency: [facility] Date HH Agency Contacted: 06/08/23 Time HH Agency Contacted: [phone] Representative spoke with at [facility] Agency: Adele Dan  Social Determinants of Health (SDOH) Interventions SDOH Screenings   Food Insecurity: No Food Insecurity (06/07/2023)  Housing: Low Risk  (06/07/2023)  Recent Concern: Housing - Medium Risk (05/15/2023)  Transportation Needs: No Transportation Needs (06/07/2023)  Utilities: Not At Risk (06/07/2023)  Alcohol Screen: Low Risk  (05/15/2023)  Depression (PHQ2-9): Low Risk  (05/15/2023)  Financial Resource Strain: Low Risk  (05/15/2023)  Physical Activity: Inactive (05/15/2023)  Social Connections: Socially Integrated  (05/15/2023)  Stress: No Stress Concern Present (05/15/2023)  Tobacco Use: Medium Risk (06/07/2023)  Health Literacy: Adequate Health Literacy (05/15/2023)     Readmission Risk Interventions     No data to display

## 2023-06-09 NOTE — Progress Notes (Signed)
Subjective: 2 Days Post-Op Procedure(s) (LRB): TOTAL KNEE ARTHROPLASTY (Right) Patient reports pain as moderate.  Just received pain medication.  Has not been receiving tramadol in addition to the oxycodone.  Has been ambulatory to the bathroom. Patient is well, and has had no acute complaints or problems Denies any CP, SOB, ABD pain. We will continue therapy today.  Plan is to go Home after hospital stay.  Objective: Vital signs in last 24 hours: Temp:  [97.8 F (36.6 C)-98.9 F (37.2 C)] 98.9 F (37.2 C) (12/07 0746) Pulse Rate:  [54-78] 65 (12/07 0746) Resp:  [16-18] 18 (12/07 0746) BP: (103-119)/(46-71) 116/59 (12/07 0746) SpO2:  [97 %-99 %] 98 % (12/07 0746)  Intake/Output from previous day: 12/06 0701 - 12/07 0700 In: 240 [P.O.:240] Out: -  Intake/Output this shift: No intake/output data recorded.  Recent Labs    06/08/23 0835 06/09/23 0251  HGB 11.2* 10.1*   Recent Labs    06/08/23 0835 06/09/23 0251  WBC 10.8* 9.2  RBC 4.01 3.61*  HCT 34.9* 31.7*  PLT 236 213   Recent Labs    06/08/23 0649  NA 138  K 4.3  CL 105  CO2 25  BUN 15  CREATININE 0.93  GLUCOSE 114*  CALCIUM 8.7*   No results for input(s): "LABPT", "INR" in the last 72 hours.  EXAM General - Patient is Alert, Appropriate, and Oriented Extremity - Neurovascular intact Sensation intact distally Intact pulses distally Dorsiflexion/Plantar flexion intact No cellulitis present Compartment soft Dressing - dressing C/D/I and no drainage Motor Function - intact, moving foot and toes well on exam.   Past Medical History:  Diagnosis Date   Anemia    Anginal pain (HCC)    Arthritis    CAD in native artery 07/20/2015   a.) LHC/PCI: 50% dLCx, 100% pRCA (2.25 x 23 mm Xience Alpine DES); b.) LHC/PTCA 07/23/2015: 80% pLAD, 100% thrombotic occlusion of pRCA stent (PTCA). Due to hemodynamic instability, PCI of LAD planed for later date; c.) LHC/PCI 07/12/2016: 50% LM, 70% mRCA, 80% pLAD  (3.25 x 18 mm Xience Alpine DES); d.) MV 09/20/2018: no ischemia; e.) MV 02/08/2021: no ischemia; f.) MV 05/17/2023: no ischemia   Cardiac arrest with ventricular fibrillation (HCC) 07/23/2015   a.) developed in setting of inferior STEMI when patient was transferred from stretcher to cath lab table --> required defibrillation x 4. (+) hemodynamic instability with SBPs in the 70s. Started on dopamine and amiodarone. Plans for further staged PCI at a later date.   Carotid artery occlusion    Cellulitis of left lower extremity    Hypercholesteremia    Hypertension    Long term current use of clopidogrel    Long-term use of aspirin therapy    Periorbital edema    treated with steroids   Pre-diabetes    Seizures (HCC)    as teenager, ages [age].  none since   Sepsis (HCC) 12/25/2018   ST elevation myocardial infarction (STEMI) of inferior wall (HCC) 07/23/2015   a.) c/b V.fib arrest requiring defibrillation x 4; b.) relook LHC/PTCA: 80% pLAD, 100% thrombotic occlusion pRCA (PTCA performed) --> plans for staged PCI of LAD due to hemodynamic instability   ST elevation myocardial infarction (STEMI) of inferior wall (HCC) 07/20/2015   a.) LHC/PCI 07/20/2015: 50% dLCx, 100% pRCA (2.25 x 23 mm Xience Alpine DES)   Umbilical hernia    Uterine fibroid     Assessment/Plan:   2 Days Post-Op Procedure(s) (LRB): TOTAL KNEE ARTHROPLASTY (Right) Principal  Problem:   S/P TKR (total knee replacement) using cement, right  Estimated body mass index is 40.35 kg/m as calculated from the following:   Height as of this encounter: 5\' 6"  (1.676 m).   Weight as of this encounter: 113.4 kg. Advance diet Up with therapy Pain well-controlled Labs and vital signs are stable Care management to assist with discharge to home with home health PT today  DVT Prophylaxis - Aspirin, TED hose, and Plavix Weight-Bearing as tolerated to right leg   T. Cranston Neighbor, PA-C [facility] 06/09/2023, 8:33  AM

## 2023-06-09 NOTE — Plan of Care (Signed)
  Problem: Education: Goal: Knowledge of the prescribed therapeutic regimen will improve Outcome: Progressing Goal: Individualized Educational Video(s) Outcome: Progressing   Problem: Activity: Goal: Ability to avoid complications of mobility impairment will improve Outcome: Progressing Goal: Range of joint motion will improve Outcome: Progressing   

## 2023-06-12 DIAGNOSIS — D649 Anemia, unspecified: Secondary | ICD-10-CM | POA: Diagnosis not present

## 2023-06-12 DIAGNOSIS — I739 Peripheral vascular disease, unspecified: Secondary | ICD-10-CM | POA: Diagnosis not present

## 2023-06-12 DIAGNOSIS — Z96651 Presence of right artificial knee joint: Secondary | ICD-10-CM | POA: Diagnosis not present

## 2023-06-12 DIAGNOSIS — I25119 Atherosclerotic heart disease of native coronary artery with unspecified angina pectoris: Secondary | ICD-10-CM | POA: Diagnosis not present

## 2023-06-12 DIAGNOSIS — I10 Essential (primary) hypertension: Secondary | ICD-10-CM | POA: Diagnosis not present

## 2023-06-12 DIAGNOSIS — Z6841 Body Mass Index (BMI) 40.0 and over, adult: Secondary | ICD-10-CM | POA: Diagnosis not present

## 2023-06-12 DIAGNOSIS — Z471 Aftercare following joint replacement surgery: Secondary | ICD-10-CM | POA: Diagnosis not present

## 2023-06-12 DIAGNOSIS — R7303 Prediabetes: Secondary | ICD-10-CM | POA: Diagnosis not present

## 2023-06-13 DIAGNOSIS — Z6841 Body Mass Index (BMI) 40.0 and over, adult: Secondary | ICD-10-CM | POA: Diagnosis not present

## 2023-06-13 DIAGNOSIS — I10 Essential (primary) hypertension: Secondary | ICD-10-CM | POA: Diagnosis not present

## 2023-06-13 DIAGNOSIS — Z96651 Presence of right artificial knee joint: Secondary | ICD-10-CM | POA: Diagnosis not present

## 2023-06-13 DIAGNOSIS — Z471 Aftercare following joint replacement surgery: Secondary | ICD-10-CM | POA: Diagnosis not present

## 2023-06-13 DIAGNOSIS — R7303 Prediabetes: Secondary | ICD-10-CM | POA: Diagnosis not present

## 2023-06-13 DIAGNOSIS — D649 Anemia, unspecified: Secondary | ICD-10-CM | POA: Diagnosis not present

## 2023-06-13 DIAGNOSIS — I25119 Atherosclerotic heart disease of native coronary artery with unspecified angina pectoris: Secondary | ICD-10-CM | POA: Diagnosis not present

## 2023-06-13 DIAGNOSIS — I739 Peripheral vascular disease, unspecified: Secondary | ICD-10-CM | POA: Diagnosis not present

## 2023-06-14 DIAGNOSIS — Z96651 Presence of right artificial knee joint: Secondary | ICD-10-CM | POA: Diagnosis not present

## 2023-06-14 DIAGNOSIS — I25119 Atherosclerotic heart disease of native coronary artery with unspecified angina pectoris: Secondary | ICD-10-CM | POA: Diagnosis not present

## 2023-06-14 DIAGNOSIS — R7303 Prediabetes: Secondary | ICD-10-CM | POA: Diagnosis not present

## 2023-06-14 DIAGNOSIS — Z471 Aftercare following joint replacement surgery: Secondary | ICD-10-CM | POA: Diagnosis not present

## 2023-06-14 DIAGNOSIS — D649 Anemia, unspecified: Secondary | ICD-10-CM | POA: Diagnosis not present

## 2023-06-14 DIAGNOSIS — I10 Essential (primary) hypertension: Secondary | ICD-10-CM | POA: Diagnosis not present

## 2023-06-14 DIAGNOSIS — I739 Peripheral vascular disease, unspecified: Secondary | ICD-10-CM | POA: Diagnosis not present

## 2023-06-14 DIAGNOSIS — Z6841 Body Mass Index (BMI) 40.0 and over, adult: Secondary | ICD-10-CM | POA: Diagnosis not present

## 2023-06-15 DIAGNOSIS — R7303 Prediabetes: Secondary | ICD-10-CM | POA: Diagnosis not present

## 2023-06-15 DIAGNOSIS — I25119 Atherosclerotic heart disease of native coronary artery with unspecified angina pectoris: Secondary | ICD-10-CM | POA: Diagnosis not present

## 2023-06-15 DIAGNOSIS — Z471 Aftercare following joint replacement surgery: Secondary | ICD-10-CM | POA: Diagnosis not present

## 2023-06-15 DIAGNOSIS — I10 Essential (primary) hypertension: Secondary | ICD-10-CM | POA: Diagnosis not present

## 2023-06-15 DIAGNOSIS — D649 Anemia, unspecified: Secondary | ICD-10-CM | POA: Diagnosis not present

## 2023-06-15 DIAGNOSIS — I739 Peripheral vascular disease, unspecified: Secondary | ICD-10-CM | POA: Diagnosis not present

## 2023-06-15 DIAGNOSIS — Z6841 Body Mass Index (BMI) 40.0 and over, adult: Secondary | ICD-10-CM | POA: Diagnosis not present

## 2023-06-15 DIAGNOSIS — Z96651 Presence of right artificial knee joint: Secondary | ICD-10-CM | POA: Diagnosis not present

## 2023-06-18 DIAGNOSIS — I25119 Atherosclerotic heart disease of native coronary artery with unspecified angina pectoris: Secondary | ICD-10-CM | POA: Diagnosis not present

## 2023-06-18 DIAGNOSIS — R7303 Prediabetes: Secondary | ICD-10-CM | POA: Diagnosis not present

## 2023-06-18 DIAGNOSIS — I10 Essential (primary) hypertension: Secondary | ICD-10-CM | POA: Diagnosis not present

## 2023-06-18 DIAGNOSIS — Z471 Aftercare following joint replacement surgery: Secondary | ICD-10-CM | POA: Diagnosis not present

## 2023-06-18 DIAGNOSIS — Z96651 Presence of right artificial knee joint: Secondary | ICD-10-CM | POA: Diagnosis not present

## 2023-06-18 DIAGNOSIS — I739 Peripheral vascular disease, unspecified: Secondary | ICD-10-CM | POA: Diagnosis not present

## 2023-06-18 DIAGNOSIS — Z6841 Body Mass Index (BMI) 40.0 and over, adult: Secondary | ICD-10-CM | POA: Diagnosis not present

## 2023-06-18 DIAGNOSIS — D649 Anemia, unspecified: Secondary | ICD-10-CM | POA: Diagnosis not present

## 2023-06-20 DIAGNOSIS — I10 Essential (primary) hypertension: Secondary | ICD-10-CM | POA: Diagnosis not present

## 2023-06-20 DIAGNOSIS — R7303 Prediabetes: Secondary | ICD-10-CM | POA: Diagnosis not present

## 2023-06-20 DIAGNOSIS — I739 Peripheral vascular disease, unspecified: Secondary | ICD-10-CM | POA: Diagnosis not present

## 2023-06-20 DIAGNOSIS — D649 Anemia, unspecified: Secondary | ICD-10-CM | POA: Diagnosis not present

## 2023-06-20 DIAGNOSIS — Z471 Aftercare following joint replacement surgery: Secondary | ICD-10-CM | POA: Diagnosis not present

## 2023-06-20 DIAGNOSIS — I25119 Atherosclerotic heart disease of native coronary artery with unspecified angina pectoris: Secondary | ICD-10-CM | POA: Diagnosis not present

## 2023-06-20 DIAGNOSIS — Z96651 Presence of right artificial knee joint: Secondary | ICD-10-CM | POA: Diagnosis not present

## 2023-06-20 DIAGNOSIS — Z6841 Body Mass Index (BMI) 40.0 and over, adult: Secondary | ICD-10-CM | POA: Diagnosis not present

## 2023-06-22 DIAGNOSIS — R7303 Prediabetes: Secondary | ICD-10-CM | POA: Diagnosis not present

## 2023-06-22 DIAGNOSIS — Z471 Aftercare following joint replacement surgery: Secondary | ICD-10-CM | POA: Diagnosis not present

## 2023-06-22 DIAGNOSIS — Z96651 Presence of right artificial knee joint: Secondary | ICD-10-CM | POA: Diagnosis not present

## 2023-06-22 DIAGNOSIS — I25119 Atherosclerotic heart disease of native coronary artery with unspecified angina pectoris: Secondary | ICD-10-CM | POA: Diagnosis not present

## 2023-06-22 DIAGNOSIS — I10 Essential (primary) hypertension: Secondary | ICD-10-CM | POA: Diagnosis not present

## 2023-06-22 DIAGNOSIS — D649 Anemia, unspecified: Secondary | ICD-10-CM | POA: Diagnosis not present

## 2023-06-22 DIAGNOSIS — I739 Peripheral vascular disease, unspecified: Secondary | ICD-10-CM | POA: Diagnosis not present

## 2023-06-22 DIAGNOSIS — Z6841 Body Mass Index (BMI) 40.0 and over, adult: Secondary | ICD-10-CM | POA: Diagnosis not present

## 2023-06-25 DIAGNOSIS — I739 Peripheral vascular disease, unspecified: Secondary | ICD-10-CM | POA: Diagnosis not present

## 2023-06-25 DIAGNOSIS — I25119 Atherosclerotic heart disease of native coronary artery with unspecified angina pectoris: Secondary | ICD-10-CM | POA: Diagnosis not present

## 2023-06-25 DIAGNOSIS — Z471 Aftercare following joint replacement surgery: Secondary | ICD-10-CM | POA: Diagnosis not present

## 2023-06-25 DIAGNOSIS — D649 Anemia, unspecified: Secondary | ICD-10-CM | POA: Diagnosis not present

## 2023-06-25 DIAGNOSIS — Z96651 Presence of right artificial knee joint: Secondary | ICD-10-CM | POA: Diagnosis not present

## 2023-06-25 DIAGNOSIS — Z6841 Body Mass Index (BMI) 40.0 and over, adult: Secondary | ICD-10-CM | POA: Diagnosis not present

## 2023-06-25 DIAGNOSIS — R7303 Prediabetes: Secondary | ICD-10-CM | POA: Diagnosis not present

## 2023-06-25 DIAGNOSIS — I10 Essential (primary) hypertension: Secondary | ICD-10-CM | POA: Diagnosis not present

## 2023-06-28 DIAGNOSIS — I25119 Atherosclerotic heart disease of native coronary artery with unspecified angina pectoris: Secondary | ICD-10-CM | POA: Diagnosis not present

## 2023-06-28 DIAGNOSIS — D649 Anemia, unspecified: Secondary | ICD-10-CM | POA: Diagnosis not present

## 2023-06-28 DIAGNOSIS — Z96651 Presence of right artificial knee joint: Secondary | ICD-10-CM | POA: Diagnosis not present

## 2023-06-28 DIAGNOSIS — R7303 Prediabetes: Secondary | ICD-10-CM | POA: Diagnosis not present

## 2023-06-28 DIAGNOSIS — I739 Peripheral vascular disease, unspecified: Secondary | ICD-10-CM | POA: Diagnosis not present

## 2023-06-28 DIAGNOSIS — Z471 Aftercare following joint replacement surgery: Secondary | ICD-10-CM | POA: Diagnosis not present

## 2023-06-28 DIAGNOSIS — I10 Essential (primary) hypertension: Secondary | ICD-10-CM | POA: Diagnosis not present

## 2023-06-28 DIAGNOSIS — Z6841 Body Mass Index (BMI) 40.0 and over, adult: Secondary | ICD-10-CM | POA: Diagnosis not present

## 2023-06-29 DIAGNOSIS — Z96651 Presence of right artificial knee joint: Secondary | ICD-10-CM | POA: Diagnosis not present

## 2023-06-29 DIAGNOSIS — M25561 Pain in right knee: Secondary | ICD-10-CM | POA: Diagnosis not present

## 2023-07-02 DIAGNOSIS — M25561 Pain in right knee: Secondary | ICD-10-CM | POA: Diagnosis not present

## 2023-07-02 DIAGNOSIS — Z96651 Presence of right artificial knee joint: Secondary | ICD-10-CM | POA: Diagnosis not present

## 2023-07-05 DIAGNOSIS — Z96651 Presence of right artificial knee joint: Secondary | ICD-10-CM | POA: Diagnosis not present

## 2023-07-09 DIAGNOSIS — Z96651 Presence of right artificial knee joint: Secondary | ICD-10-CM | POA: Diagnosis not present

## 2023-07-09 DIAGNOSIS — M25561 Pain in right knee: Secondary | ICD-10-CM | POA: Diagnosis not present

## 2023-07-11 DIAGNOSIS — M25561 Pain in right knee: Secondary | ICD-10-CM | POA: Diagnosis not present

## 2023-07-11 DIAGNOSIS — Z96651 Presence of right artificial knee joint: Secondary | ICD-10-CM | POA: Diagnosis not present

## 2023-07-13 ENCOUNTER — Ambulatory Visit (INDEPENDENT_AMBULATORY_CARE_PROVIDER_SITE_OTHER): Payer: 59 | Admitting: Family

## 2023-07-13 ENCOUNTER — Encounter: Payer: Self-pay | Admitting: Family

## 2023-07-13 VITALS — BP 106/74 | HR 62 | Ht 66.0 in | Wt 255.0 lb

## 2023-07-13 DIAGNOSIS — R7303 Prediabetes: Secondary | ICD-10-CM | POA: Diagnosis not present

## 2023-07-13 DIAGNOSIS — E538 Deficiency of other specified B group vitamins: Secondary | ICD-10-CM | POA: Diagnosis not present

## 2023-07-13 DIAGNOSIS — I10 Essential (primary) hypertension: Secondary | ICD-10-CM | POA: Diagnosis not present

## 2023-07-13 DIAGNOSIS — E782 Mixed hyperlipidemia: Secondary | ICD-10-CM

## 2023-07-13 DIAGNOSIS — Z96651 Presence of right artificial knee joint: Secondary | ICD-10-CM | POA: Diagnosis not present

## 2023-07-13 DIAGNOSIS — E559 Vitamin D deficiency, unspecified: Secondary | ICD-10-CM

## 2023-07-13 NOTE — Assessment & Plan Note (Signed)
 Blood pressure well controlled with current medications.  Continue current therapy.  Will reassess at follow up.

## 2023-07-13 NOTE — Progress Notes (Signed)
 Established Patient Office Visit  Subjective:  Patient ID: Sarah Medina, female    DOB: [birth year]  Age: 58 y.o. MRN: [id number]  Chief Complaint  Patient presents with   Follow-up    3 month follow up    Patient is here today for her 3 months follow up.  She has been feeling fairly well since last appointment.   She does have additional concerns to discuss today.  She did have her knee replacement.  She is still undergoing physical therapy.  She is having much less knee pain than she was previously.   Labs are due today. She needs refills.   I have reviewed her active problem list, medication list, allergies, health maintenance, notes from last encounter, lab results for her appointment today.     No other concerns at this time.   Past Medical History:  Diagnosis Date   Anemia    Anginal pain (HCC)    Arthritis    CAD in native artery 07/20/2015   a.) LHC/PCI: 50% dLCx, 100% pRCA (2.25 x 23 mm Xience Alpine DES); b.) LHC/PTCA 07/23/2015: 80% pLAD, 100% thrombotic occlusion of pRCA stent (PTCA). Due to hemodynamic instability, PCI of LAD planed for later date; c.) LHC/PCI 07/12/2016: 50% LM, 70% mRCA, 80% pLAD (3.25 x 18 mm Xience Alpine DES); d.) MV 09/20/2018: no ischemia; e.) MV 02/08/2021: no ischemia; f.) MV 05/17/2023: no ischemia   Cardiac arrest with ventricular fibrillation (HCC) 07/23/2015   a.) developed in setting of inferior STEMI when patient was transferred from stretcher to cath lab table --> required defibrillation x 4. (+) hemodynamic instability with SBPs in the 70s. Started on dopamine  and amiodarone . Plans for further staged PCI at a later date.   Carotid artery occlusion    Cellulitis of left lower extremity    Hypercholesteremia    Hypertension    Long term current use of clopidogrel     Long-term use of aspirin  therapy    Periorbital edema    treated with steroids   Pre-diabetes    Seizures (HCC)    as teenager, ages [age].  none since    Sepsis (HCC) 12/25/2018   ST elevation myocardial infarction (STEMI) of inferior wall (HCC) 07/23/2015   a.) c/b V.fib arrest requiring defibrillation x 4; b.) relook LHC/PTCA: 80% pLAD, 100% thrombotic occlusion pRCA (PTCA performed) --> plans for staged PCI of LAD due to hemodynamic instability   ST elevation myocardial infarction (STEMI) of inferior wall (HCC) 07/20/2015   a.) LHC/PCI 07/20/2015: 50% dLCx, 100% pRCA (2.25 x 23 mm Xience Alpine DES)   Umbilical hernia    Uterine fibroid     Past Surgical History:  Procedure Laterality Date   CARDIAC CATHETERIZATION N/A 07/20/2015   Procedure: Left Heart Cath and Coronary Angiography;  Surgeon: Marsa Dooms, MD;  Location: [facility] INVASIVE CV LAB;  Service: Cardiovascular;  Laterality: N/A;   CARDIAC CATHETERIZATION N/A 07/20/2015   Procedure: Coronary Stent Intervention;  Surgeon: Marsa Dooms, MD;  Location: [facility] INVASIVE CV LAB;  Service: Cardiovascular;  Laterality: N/A;   CARDIAC CATHETERIZATION N/A 07/23/2015   Procedure: Left Heart Cath and Coronary Angiography;  Surgeon: Dorn JINNY Lesches, MD;  Location: [facility] INVASIVE CV LAB;  Service: Cardiovascular;  Laterality: N/A;   CARDIAC CATHETERIZATION N/A 07/23/2015   Procedure: Coronary Balloon Angioplasty;  Surgeon: Dorn JINNY Lesches, MD;  Location: [facility] INVASIVE CV LAB;  Service: Cardiovascular;  Laterality: N/A;   CARDIAC CATHETERIZATION N/A 07/12/2016   Procedure: Left Heart Cath;  Surgeon:  Vinie DELENA Jude, MD;  Location: [facility] INVASIVE CV LAB;  Service: Cardiovascular;  Laterality: N/A;   CARDIAC CATHETERIZATION N/A 07/12/2016   Procedure: Left Heart Cath and Coronary Angiography;  Surgeon: Marsa Dooms, MD;  Location: [facility] INVASIVE CV LAB;  Service: Cardiovascular;  Laterality: N/A;   CARDIAC CATHETERIZATION N/A 07/12/2016   Procedure: Intravascular Pressure Wire/FFR Study;  Surgeon: Marsa Dooms, MD;  Location: [facility] INVASIVE CV LAB;  Service: Cardiovascular;  Laterality:  N/A;   CARDIAC CATHETERIZATION N/A 07/12/2016   Procedure: Coronary Stent Intervention;  Surgeon: Marsa Dooms, MD;  Location: [facility] INVASIVE CV LAB;  Service: Cardiovascular;  Laterality: N/A;   KNEE SURGERY Right 15 years ago   TOTAL KNEE ARTHROPLASTY Right 06/07/2023   Procedure: TOTAL KNEE ARTHROPLASTY;  Surgeon: Lorelle Hussar, MD;  Location: [facility] ORS;  Service: Orthopedics;  Laterality: Right;    Social History   Socioeconomic History   Marital status: Married    Spouse name: Danowski,Johnny (Spouse)   Number of children: Not on file   Years of education: Not on file   Highest education level: Not on file  Occupational History   Not on file  Tobacco Use   Smoking status: Never   Smokeless tobacco: Former    Types: Snuff    Quit date: 2014  Vaping Use   Vaping status: Never Used  Substance and Sexual Activity   Alcohol use: No   Drug use: Never   Sexual activity: Yes    Birth control/protection: None  Other Topics Concern   Not on file  Social History Narrative   Not on file   Social Drivers of Health   Financial Resource Strain: Low Risk  (05/15/2023)   Overall Financial Resource Strain (CARDIA)    Difficulty of Paying Living Expenses: Not hard at all  Food Insecurity: No Food Insecurity (06/07/2023)   Hunger Vital Sign    Worried About Running Out of Food in the Last Year: Never true    Ran Out of Food in the Last Year: Never true  Transportation Needs: No Transportation Needs (06/07/2023)   PRAPARE - Administrator, Civil Service (Medical): No    Lack of Transportation (Non-Medical): No  Physical Activity: Inactive (05/15/2023)   Exercise Vital Sign    Days of Exercise per Week: 0 days    Minutes of Exercise per Session: 0 min  Stress: No Stress Concern Present (05/15/2023)   [employer] of Occupational Health - Occupational Stress Questionnaire    Feeling of Stress : Only a little  Social Connections: Socially Integrated (05/15/2023)    Social Connection and Isolation Panel [NHANES]    Frequency of Communication with Friends and Family: More than three times a week    Frequency of Social Gatherings with Friends and Family: Once a week    Attends Religious Services: More than 4 times per year    Active Member of [employer] or Organizations: Yes    Attends Engineer, Structural: More than 4 times per year    Marital Status: Married  Catering Manager Violence: Not At Risk (06/07/2023)   Humiliation, Afraid, Rape, and Kick questionnaire    Fear of Current or Ex-Partner: No    Emotionally Abused: No    Physically Abused: No    Sexually Abused: No    Family History  Problem Relation Age of Onset   Breast cancer Sister 36    Allergies  Allergen Reactions   Enalapril  Swelling    Swelling face  Review of Systems  All other systems reviewed and are negative.      Objective:   BP 106/74   Pulse 62   Ht 5' 6 (1.676 m)   Wt 255 lb (115.7 kg)   LMP 05/19/2015 (LMP Unknown)   SpO2 95%   BMI 41.16 kg/m   Vitals:   07/13/23 0914  BP: 106/74  Pulse: 62  Height: 5' 6 (1.676 m)  Weight: 255 lb (115.7 kg)  SpO2: 95%  BMI (Calculated): 41.18    Physical Exam Vitals and nursing note reviewed.  Constitutional:      Appearance: Normal appearance. She is normal weight.  HENT:     Head: Normocephalic.  Eyes:     Extraocular Movements: Extraocular movements intact.     Conjunctiva/sclera: Conjunctivae normal.     Pupils: Pupils are equal, round, and reactive to light.  Cardiovascular:     Rate and Rhythm: Normal rate.  Pulmonary:     Effort: Pulmonary effort is normal.  Neurological:     General: No focal deficit present.     Mental Status: She is alert and oriented to person, place, and time. Mental status is at baseline.  Psychiatric:        Mood and Affect: Mood normal.        Behavior: Behavior normal.        Thought Content: Thought content normal.        Judgment: Judgment normal.       No results found for any visits on 07/13/23.  Recent Results (from the past 2160 hours)  Hemoglobin A1c     Status: Abnormal   Collection Time: 05/15/23  1:32 PM  Result Value Ref Range   Hgb A1c MFr Bld 6.3 (H) 4.8 - 5.6 %    Comment:          Prediabetes: 5.7 - 6.4          Diabetes: >6.4          Glycemic control for adults with diabetes: <7.0    Est. average glucose Bld gHb Est-mCnc 134 mg/dL  Lipid panel     Status: None   Collection Time: 05/15/23  1:32 PM  Result Value Ref Range   Cholesterol, Total 163 100 - 199 mg/dL   Triglycerides 81 0 - 149 mg/dL   HDL 63 >60 mg/dL   VLDL Cholesterol Cal 15 5 - 40 mg/dL   LDL Chol Calc (NIH) 85 0 - 99 mg/dL   Chol/HDL Ratio 2.6 0.0 - 4.4 ratio    Comment:                                   T. Chol/HDL Ratio                                             Men  Women                               1/2 Avg.Risk  3.4    3.3                                   Avg.Risk  5.0  4.4                                2X Avg.Risk  9.6    7.1                                3X Avg.Risk 23.4   11.0   CBC With Diff/Platelet     Status: Abnormal   Collection Time: 05/15/23  1:32 PM  Result Value Ref Range   WBC 6.9 3.4 - 10.8 x10E3/uL   RBC 5.32 (H) 3.77 - 5.28 x10E6/uL   Hemoglobin 14.7 11.1 - 15.9 g/dL   Hematocrit 53.6 65.9 - 46.6 %   MCV 87 79 - 97 fL   MCH 27.6 26.6 - 33.0 pg   MCHC 31.7 31.5 - 35.7 g/dL   RDW 85.6 88.2 - 84.5 %   Platelets 215 150 - 450 x10E3/uL   Neutrophils 67 Not Estab. %   Lymphs 25 Not Estab. %   Monocytes 6 Not Estab. %   Eos 2 Not Estab. %   Basos 0 Not Estab. %   Neutrophils Absolute 4.6 1.4 - 7.0 x10E3/uL   Lymphocytes Absolute 1.7 0.7 - 3.1 x10E3/uL   Monocytes Absolute 0.4 0.1 - 0.9 x10E3/uL   EOS (ABSOLUTE) 0.1 0.0 - 0.4 x10E3/uL   Basophils Absolute 0.0 0.0 - 0.2 x10E3/uL   Immature Granulocytes 0 Not Estab. %   Immature Grans (Abs) 0.0 0.0 - 0.1 x10E3/uL  CMP14+EGFR     Status: None   Collection  Time: 05/15/23  1:32 PM  Result Value Ref Range   Glucose 81 70 - 99 mg/dL   BUN 14 6 - 24 mg/dL   Creatinine, Ser 9.06 0.57 - 1.00 mg/dL   eGFR 71 >40 fO/fpw/8.26   BUN/Creatinine Ratio 15 9 - 23   Sodium 140 134 - 144 mmol/L   Potassium 4.7 3.5 - 5.2 mmol/L   Chloride 100 96 - 106 mmol/L   CO2 25 20 - 29 mmol/L   Calcium  9.6 8.7 - 10.2 mg/dL   Total Protein 7.4 6.0 - 8.5 g/dL   Albumin 4.3 3.8 - 4.9 g/dL   Globulin, Total 3.1 1.5 - 4.5 g/dL   Bilirubin Total 0.7 0.0 - 1.2 mg/dL   Alkaline Phosphatase 112 44 - 121 IU/L   AST 17 0 - 40 IU/L   ALT 11 0 - 32 IU/L  NM Myocar Multi W/Spect W/Wall Motion / EF     Status: None   Collection Time: 05/17/23  2:35 PM  Result Value Ref Range   Rest HR 50.0 bpm   Rest BP 147/57 mmHg   Exercise duration (min) 1 min   Estimated workload 1.0    Peak HR 98 bpm   Peak BP 163/62 mmHg   MPHR 162 bpm   Percent HR 60.0 %   Rest Nuclear Isotope Dose 10.0 mCi   Stress Nuclear Isotope Dose 31.4 mCi   SSS 0.0    SRS 0.0    SDS 0.0    TID 1.07    LV sys vol 31.0 mL   LV dias vol 84.0 46 - 106 mL   Nuc Stress EF 63 %   ST Depression (mm) 0 mm  Surgical pcr screen     Status: Abnormal   Collection Time: 05/28/23 10:03 AM   Specimen: Nasal Mucosa; Nasal Swab  Result Value Ref Range   MRSA, PCR NEGATIVE NEGATIVE   Staphylococcus aureus POSITIVE (A) NEGATIVE    Comment: (NOTE) The Xpert SA Assay (FDA approved for NASAL specimens in patients 63 years of age and older), is one component of a comprehensive surveillance program. It is not intended to diagnose infection nor to guide or monitor treatment. Performed at [facility], [address] [place], [place] [address]   Urinalysis, Routine w reflex microscopic -Urine, Clean Catch     Status: Abnormal   Collection Time: 05/28/23 10:03 AM  Result Value Ref Range   Color, Urine YELLOW (A) YELLOW   APPearance HAZY (A) CLEAR   Specific Gravity, Urine 1.027 1.005 - 1.030   pH 5.0 5.0  - 8.0   Glucose, UA NEGATIVE NEGATIVE mg/dL   Hgb urine dipstick NEGATIVE NEGATIVE   Bilirubin Urine NEGATIVE NEGATIVE   Ketones, ur NEGATIVE NEGATIVE mg/dL   Protein, ur NEGATIVE NEGATIVE mg/dL   Nitrite NEGATIVE NEGATIVE   Leukocytes,Ua LARGE (A) NEGATIVE   RBC / HPF 6-10 0 - 5 RBC/hpf   WBC, UA 11-20 0 - 5 WBC/hpf   Bacteria, UA FEW (A) NONE SEEN   Squamous Epithelial / HPF 11-20 0 - 5 /HPF   Mucus PRESENT     Comment: Performed at [facility], [address], [place], [place] [address]  Urine Culture Add-On     Status: Abnormal   Collection Time: 05/28/23 10:03 AM   Specimen: Urine, Clean Catch  Result Value Ref Range   Specimen Description      URINE, CLEAN CATCH Performed at [facility], [address], [place], [place] [address]    Special Requests      NONE Performed at [facility], [address] [place], [place] [address]    Culture (A)     >=100,000 COLONIES/mL GROUP B STREP(S.AGALACTIAE)ISOLATED TESTING AGAINST S. AGALACTIAE NOT ROUTINELY PERFORMED DUE TO PREDICTABILITY OF AMP/PEN/VAN SUSCEPTIBILITY. Performed at [facility], [address] [place], [place] [address]    Report Status 05/30/2023 FINAL   Basic metabolic panel     Status: Abnormal   Collection Time: 06/08/23  6:49 AM  Result Value Ref Range   Sodium 138 135 - 145 mmol/L   Potassium 4.3 3.5 - 5.1 mmol/L   Chloride 105 98 - 111 mmol/L   CO2 25 22 - 32 mmol/L   Glucose, Bld 114 (H) 70 - 99 mg/dL    Comment: Glucose reference range applies only to samples taken after fasting for at least 8 hours.   BUN 15 6 - 20 mg/dL   Creatinine, Ser 9.06 0.44 - 1.00 mg/dL   Calcium  8.7 (L) 8.9 - 10.3 mg/dL   GFR, Estimated >39 >39 mL/min    Comment: (NOTE) Calculated using the CKD-EPI Creatinine Equation (2021)    Anion gap 8 5 - 15    Comment: Performed at [facility], [address] [place], [place] [address]  CBC     Status: Abnormal   Collection Time:  06/08/23  8:35 AM  Result Value Ref Range   WBC 10.8 (H) 4.0 - 10.5 K/uL   RBC 4.01 3.87 - 5.11 MIL/uL   Hemoglobin 11.2 (L) 12.0 - 15.0 g/dL   HCT 65.0 (L) 63.9 - 53.9 %   MCV 87.0 80.0 - 100.0 fL   MCH 27.9 26.0 - 34.0 pg   MCHC 32.1 30.0 - 36.0 g/dL   RDW 84.8 88.4 - 84.4 %  Platelets 236 150 - 400 K/uL   nRBC 0.0 0.0 - 0.2 %    Comment: Performed at [facility], [address] [place], [place] [address]  CBC     Status: Abnormal   Collection Time: 06/09/23  2:51 AM  Result Value Ref Range   WBC 9.2 4.0 - 10.5 K/uL   RBC 3.61 (L) 3.87 - 5.11 MIL/uL   Hemoglobin 10.1 (L) 12.0 - 15.0 g/dL   HCT 68.2 (L) 63.9 - 53.9 %   MCV 87.8 80.0 - 100.0 fL   MCH 28.0 26.0 - 34.0 pg   MCHC 31.9 30.0 - 36.0 g/dL   RDW 84.5 88.4 - 84.4 %   Platelets 213 150 - 400 K/uL   nRBC 0.0 0.0 - 0.2 %    Comment: Performed at [facility], [address] [place], [place] [address]       Assessment & Plan:   Problem List Items Addressed This Visit       Cardiovascular and Mediastinum   Essential hypertension - Primary   Blood pressure well controlled with current medications.  Continue current therapy.  Will reassess at follow up.          Other   Hyperlipidemia   Checking labs today.  Continue current therapy for lipid control. Will modify as needed based on labwork results.        Severe obesity with body mass index (BMI) of 35.0 to 39.9 with comorbidity (HCC)   Continue current meds.  Will adjust as needed based on results.  The patient is asked to make an attempt to improve diet and exercise patterns to aid in medical management of this problem. Addressed importance of increasing and maintaining water intake.        Vitamin D  deficiency, unspecified   Checking labs today.  Will continue supplements as needed.        B12 deficiency due to diet   Checking labs today.  Will continue supplements as needed.        Prediabetes   A1C Continues to be  in prediabetic ranges.  Will reassess at follow up after next lab check.  Patient counseled on dietary choices and verbalized understanding.  Patient educated on foods that contain carbohydrates and the need to decrease intake.  We discussed prediabetes, and what it means and the need for strict dietary control to prevent progression to type 2 diabetes.  Advised to decrease intake of sugary drinks, including sodas, sweet tea, and some juices, and of starch and sugar heavy foods (ie., potatoes, rice, bread, pasta, desserts). She verbalizes understanding and agreement with the changes discussed today.         Return in about 3 months (around 10/11/2023).   Total time spent: 20 minutes  ALAN CHRISTELLA ARRANT, FNP  07/13/2023   This document may have been prepared by [facility] Voice Recognition software and as such may include unintentional dictation errors.

## 2023-07-13 NOTE — Assessment & Plan Note (Signed)
 Checking labs today.  Will continue supplements as needed.

## 2023-07-13 NOTE — Assessment & Plan Note (Signed)

## 2023-07-13 NOTE — Assessment & Plan Note (Addendum)
 Continue supplements as needed.  Most recent level was low.  Will reassess at follow up.

## 2023-07-13 NOTE — Assessment & Plan Note (Addendum)
 Continue current therapy for lipid control. Will modify as needed based on labwork results.

## 2023-07-13 NOTE — Assessment & Plan Note (Signed)
 Continue current meds.  Will adjust as needed based on results.  The patient is asked to make an attempt to improve diet and exercise patterns to aid in medical management of this problem. Addressed importance of increasing and maintaining water intake.

## 2023-07-18 DIAGNOSIS — M25561 Pain in right knee: Secondary | ICD-10-CM | POA: Diagnosis not present

## 2023-07-18 DIAGNOSIS — Z96651 Presence of right artificial knee joint: Secondary | ICD-10-CM | POA: Diagnosis not present

## 2023-07-20 DIAGNOSIS — M25561 Pain in right knee: Secondary | ICD-10-CM | POA: Diagnosis not present

## 2023-07-20 DIAGNOSIS — Z96651 Presence of right artificial knee joint: Secondary | ICD-10-CM | POA: Diagnosis not present

## 2023-07-27 DIAGNOSIS — Z96651 Presence of right artificial knee joint: Secondary | ICD-10-CM | POA: Diagnosis not present

## 2023-08-01 DIAGNOSIS — Z96651 Presence of right artificial knee joint: Secondary | ICD-10-CM | POA: Diagnosis not present

## 2023-08-01 DIAGNOSIS — M25561 Pain in right knee: Secondary | ICD-10-CM | POA: Diagnosis not present

## 2023-08-03 DIAGNOSIS — Z96651 Presence of right artificial knee joint: Secondary | ICD-10-CM | POA: Diagnosis not present

## 2023-08-03 DIAGNOSIS — M25561 Pain in right knee: Secondary | ICD-10-CM | POA: Diagnosis not present

## 2023-08-08 DIAGNOSIS — M25561 Pain in right knee: Secondary | ICD-10-CM | POA: Diagnosis not present

## 2023-08-08 DIAGNOSIS — Z96651 Presence of right artificial knee joint: Secondary | ICD-10-CM | POA: Diagnosis not present

## 2023-08-10 DIAGNOSIS — M25561 Pain in right knee: Secondary | ICD-10-CM | POA: Diagnosis not present

## 2023-08-10 DIAGNOSIS — Z96651 Presence of right artificial knee joint: Secondary | ICD-10-CM | POA: Diagnosis not present

## 2023-08-14 DIAGNOSIS — Z961 Presence of intraocular lens: Secondary | ICD-10-CM | POA: Diagnosis not present

## 2023-08-15 DIAGNOSIS — Z96651 Presence of right artificial knee joint: Secondary | ICD-10-CM | POA: Diagnosis not present

## 2023-08-15 DIAGNOSIS — M25561 Pain in right knee: Secondary | ICD-10-CM | POA: Diagnosis not present

## 2023-08-17 ENCOUNTER — Other Ambulatory Visit: Payer: Self-pay | Admitting: Family

## 2023-08-17 DIAGNOSIS — M25561 Pain in right knee: Secondary | ICD-10-CM | POA: Diagnosis not present

## 2023-08-17 DIAGNOSIS — Z96651 Presence of right artificial knee joint: Secondary | ICD-10-CM | POA: Diagnosis not present

## 2023-08-17 DIAGNOSIS — Z1211 Encounter for screening for malignant neoplasm of colon: Secondary | ICD-10-CM | POA: Diagnosis not present

## 2023-08-21 ENCOUNTER — Other Ambulatory Visit: Payer: Self-pay | Admitting: Family

## 2023-08-22 DIAGNOSIS — Z96651 Presence of right artificial knee joint: Secondary | ICD-10-CM | POA: Diagnosis not present

## 2023-08-22 MED ORDER — FUROSEMIDE 20 MG PO TABS: 40.0000 mg | ORAL_TABLET | Freq: Every day | ORAL | 0 refills | Status: DC

## 2023-08-29 ENCOUNTER — Other Ambulatory Visit: Payer: Self-pay

## 2023-08-29 DIAGNOSIS — Z96651 Presence of right artificial knee joint: Secondary | ICD-10-CM | POA: Diagnosis not present

## 2023-08-29 DIAGNOSIS — M25561 Pain in right knee: Secondary | ICD-10-CM | POA: Diagnosis not present

## 2023-08-29 MED ORDER — FUROSEMIDE 20 MG PO TABS: 40.0000 mg | ORAL_TABLET | Freq: Every day | ORAL | 0 refills | Status: DC

## 2023-08-30 DIAGNOSIS — H25812 Combined forms of age-related cataract, left eye: Secondary | ICD-10-CM | POA: Diagnosis not present

## 2023-08-31 DIAGNOSIS — Z96651 Presence of right artificial knee joint: Secondary | ICD-10-CM | POA: Diagnosis not present

## 2023-09-05 DIAGNOSIS — Z96651 Presence of right artificial knee joint: Secondary | ICD-10-CM | POA: Diagnosis not present

## 2023-09-05 DIAGNOSIS — M25561 Pain in right knee: Secondary | ICD-10-CM | POA: Diagnosis not present

## 2023-09-07 DIAGNOSIS — Z96651 Presence of right artificial knee joint: Secondary | ICD-10-CM | POA: Diagnosis not present

## 2023-09-12 DIAGNOSIS — Z96651 Presence of right artificial knee joint: Secondary | ICD-10-CM | POA: Diagnosis not present

## 2023-09-12 DIAGNOSIS — M25561 Pain in right knee: Secondary | ICD-10-CM | POA: Diagnosis not present

## 2023-09-18 DIAGNOSIS — Z96651 Presence of right artificial knee joint: Secondary | ICD-10-CM | POA: Diagnosis not present

## 2023-09-21 DIAGNOSIS — H2512 Age-related nuclear cataract, left eye: Secondary | ICD-10-CM | POA: Diagnosis not present

## 2023-09-21 DIAGNOSIS — I10 Essential (primary) hypertension: Secondary | ICD-10-CM | POA: Diagnosis not present

## 2023-09-21 DIAGNOSIS — H25812 Combined forms of age-related cataract, left eye: Secondary | ICD-10-CM | POA: Diagnosis not present

## 2023-10-05 ENCOUNTER — Encounter: Payer: Self-pay | Admitting: Gastroenterology

## 2023-10-08 ENCOUNTER — Encounter: Payer: Self-pay | Admitting: Gastroenterology

## 2023-10-11 ENCOUNTER — Encounter: Payer: Self-pay | Admitting: Gastroenterology

## 2023-10-11 NOTE — OR Nursing (Signed)
 While doing a pre-procedure call with the patient, this RN asked the patient about getting a clearance from her cardiologist about stopping her Plavix.  Patient stated "I'll stop it the day before." The RN informed the patient that usually they have to hold this particular medication for multiple days prior to procedure.  Then this RN asked when the last time the patient spoke with her cardiologist and the patient responded saying that this RN was the first person she has spoken with.  At this point, this RN informed the patient that he will contact Juliane Poot, PA at [facility] and asked him about this patient.  Mason informed this RN that the patient told him 2 months ago that she was not taking Plavix.  Marlene Bast also states that he will have the clinic RN call her primary care provider to find out if the patient is taking Plavix, and would call me back.

## 2023-10-12 ENCOUNTER — Ambulatory Visit (INDEPENDENT_AMBULATORY_CARE_PROVIDER_SITE_OTHER): Payer: 59 | Admitting: Family

## 2023-10-12 ENCOUNTER — Encounter: Payer: Self-pay | Admitting: Family

## 2023-10-12 VITALS — BP 118/78 | HR 70 | Ht 66.0 in | Wt 260.6 lb

## 2023-10-12 DIAGNOSIS — I10 Essential (primary) hypertension: Secondary | ICD-10-CM

## 2023-10-12 DIAGNOSIS — R7303 Prediabetes: Secondary | ICD-10-CM | POA: Diagnosis not present

## 2023-10-12 DIAGNOSIS — E039 Hypothyroidism, unspecified: Secondary | ICD-10-CM

## 2023-10-12 DIAGNOSIS — E782 Mixed hyperlipidemia: Secondary | ICD-10-CM

## 2023-10-12 DIAGNOSIS — E559 Vitamin D deficiency, unspecified: Secondary | ICD-10-CM

## 2023-10-12 DIAGNOSIS — E538 Deficiency of other specified B group vitamins: Secondary | ICD-10-CM

## 2023-10-12 DIAGNOSIS — Z01818 Encounter for other preprocedural examination: Secondary | ICD-10-CM | POA: Diagnosis not present

## 2023-10-12 NOTE — Progress Notes (Signed)
 Established Patient Office Visit  Subjective:  Patient ID: Sarah Medina, female    DOB: 1966/03/15  Age: 58 y.o. MRN: [id number]  Chief Complaint  Patient presents with   Follow-up    3 month follow up    Patient is here today for her 4 months follow up.  She has been feeling fairly well since last appointment.   She does have additional concerns to discuss today.  She has an appointment next week for colonoscopy, but they have sent paperwork to ask about her plavix and clearance.   Labs are due today. She needs refills.   I have reviewed her active problem list, medication list, allergies, notes from last encounter, lab results for her appointment today.      No other concerns at this time.   Past Medical History:  Diagnosis Date   Anemia    Anginal pain (HCC)    Arthritis    B12 deficiency    CAD in native artery 07/20/2015   a.) LHC/PCI: 50% dLCx, 100% pRCA (2.25 x 23 mm Xience Alpine DES); b.) LHC/PTCA 07/23/2015: 80% pLAD, 100% thrombotic occlusion of pRCA stent (PTCA). Due to hemodynamic instability, PCI of LAD planed for later date; c.) LHC/PCI 07/12/2016: 50% LM, 70% mRCA, 80% pLAD (3.25 x 18 mm Xience Alpine DES); d.) MV 09/20/2018: no ischemia; e.) MV 02/08/2021: no ischemia; f.) MV 05/17/2023: no ischemia   Cardiac arrest with ventricular fibrillation (HCC) 07/23/2015   a.) developed in setting of inferior STEMI when patient was transferred from stretcher to cath lab table --> required defibrillation x 4. (+) hemodynamic instability with SBPs in the 70s. Started on dopamine and amiodarone. Plans for further staged PCI at a later date.   Carotid artery occlusion    Cellulitis of left lower extremity    Chronic ischemic heart disease    Hypercholesteremia    Hypertension    Long term current use of clopidogrel    Long-term use of aspirin therapy    Osteoarthritis of knee    Periorbital edema    treated with steroids   Peripheral vascular disease (HCC)     Pre-diabetes    Seizures (HCC)    as teenager, ages [age].  none since   Sepsis (HCC) 12/25/2018   ST elevation (STEMI) myocardial infarction [facility])    ST elevation myocardial infarction (STEMI) of inferior wall (HCC) 07/23/2015   a.) c/b V.fib arrest requiring defibrillation x 4; b.) relook LHC/PTCA: 80% pLAD, 100% thrombotic occlusion pRCA (PTCA performed) --> plans for staged PCI of LAD due to hemodynamic instability   ST elevation myocardial infarction (STEMI) of inferior wall (HCC) 07/20/2015   a.) LHC/PCI 07/20/2015: 50% dLCx, 100% pRCA (2.25 x 23 mm Xience Alpine DES)   Umbilical hernia    Uterine fibroid    Ventricular fibrillation (HCC)    Vitamin D deficiency     Past Surgical History:  Procedure Laterality Date   CARDIAC CATHETERIZATION N/A 07/20/2015   Procedure: Left Heart Cath and Coronary Angiography;  Surgeon: Marcina Millard, MD;  Location: [facility] INVASIVE CV LAB;  Service: Cardiovascular;  Laterality: N/A;   CARDIAC CATHETERIZATION N/A 07/20/2015   Procedure: Coronary Stent Intervention;  Surgeon: Marcina Millard, MD;  Location: [facility] INVASIVE CV LAB;  Service: Cardiovascular;  Laterality: N/A;   CARDIAC CATHETERIZATION N/A 07/23/2015   Procedure: Left Heart Cath and Coronary Angiography;  Surgeon: Runell Gess, MD;  Location: [facility] INVASIVE CV LAB;  Service: Cardiovascular;  Laterality: N/A;   CARDIAC CATHETERIZATION  N/A 07/23/2015   Procedure: Coronary Balloon Angioplasty;  Surgeon: Runell Gess, MD;  Location: [facility] INVASIVE CV LAB;  Service: Cardiovascular;  Laterality: N/A;   CARDIAC CATHETERIZATION N/A 07/12/2016   Procedure: Left Heart Cath;  Surgeon: Dalia Heading, MD;  Location: [facility] INVASIVE CV LAB;  Service: Cardiovascular;  Laterality: N/A;   CARDIAC CATHETERIZATION N/A 07/12/2016   Procedure: Left Heart Cath and Coronary Angiography;  Surgeon: Marcina Millard, MD;  Location: [facility] INVASIVE CV LAB;  Service: Cardiovascular;  Laterality: N/A;    CARDIAC CATHETERIZATION N/A 07/12/2016   Procedure: Intravascular Pressure Wire/FFR Study;  Surgeon: Marcina Millard, MD;  Location: [facility] INVASIVE CV LAB;  Service: Cardiovascular;  Laterality: N/A;   CARDIAC CATHETERIZATION N/A 07/12/2016   Procedure: Coronary Stent Intervention;  Surgeon: Marcina Millard, MD;  Location: [facility] INVASIVE CV LAB;  Service: Cardiovascular;  Laterality: N/A;   CORONARY ARTERY BYPASS GRAFT     JOINT REPLACEMENT     KNEE SURGERY Right 15 years ago   S/P coronary artery stent placement N/A    S/P TKR (total knee replacement) using cement, right Right    TOTAL KNEE ARTHROPLASTY Right 06/07/2023   Procedure: TOTAL KNEE ARTHROPLASTY;  Surgeon: Reinaldo Berber, MD;  Location: [facility] ORS;  Service: Orthopedics;  Laterality: Right;    Social History   Socioeconomic History   Marital status: Married    Spouse name: Barnhill,Johnny (Spouse)   Number of children: Not on file   Years of education: Not on file   Highest education level: Not on file  Occupational History   Not on file  Tobacco Use   Smoking status: Never   Smokeless tobacco: Former    Types: Snuff    Quit date: 2014  Vaping Use   Vaping status: Never Used  Substance and Sexual Activity   Alcohol use: No   Drug use: Never   Sexual activity: Yes    Birth control/protection: None  Other Topics Concern   Not on file  Social History Narrative   Not on file   Social Drivers of Health   Financial Resource Strain: Low Risk  (08/17/2023)   Received from [facility]   Overall Financial Resource Strain (CARDIA)    Difficulty of Paying Living Expenses: Not hard at all  Food Insecurity: No Food Insecurity (08/17/2023)   Received from [facility]   Hunger Vital Sign    Worried About Running Out of Food in the Last Year: Never true    Ran Out of Food in the Last Year: Never true  Transportation Needs: No Transportation Needs (08/17/2023)   Received [facility]    In the past 12 months, has lack of transportation kept you from medical appointments or from getting medications?: No    Lack of Transportation (Non-Medical): No  Physical Activity: Inactive (05/15/2023)   Exercise Vital Sign    Days of Exercise per Week: 0 days    Minutes of Exercise per Session: 0 min  Stress: No Stress Concern Present (05/15/2023)   [employer] of Occupational Health - Occupational Stress Questionnaire    Feeling of Stress : Only a little  Social Connections: Socially Integrated (05/15/2023)   Social Connection and Isolation Panel [NHANES]    Frequency of Communication with Friends and Family: More than three times a week    Frequency of Social Gatherings with Friends and Family: Once a week    Attends Religious Services: More than  4 times per year    Active Member of Clubs or Organizations: Yes    Attends Banker Meetings: More than 4 times per year    Marital Status: Married  Catering manager Violence: Not At Risk (06/07/2023)   Humiliation, Afraid, Rape, and Kick questionnaire    Fear of Current or Ex-Partner: No    Emotionally Abused: No    Physically Abused: No    Sexually Abused: No    Family History  Problem Relation Age of Onset   Breast cancer Sister 37    Allergies  Allergen Reactions   Enalapril Swelling    Swelling face    Review of Systems  All other systems reviewed and are negative.      Objective:   BP 118/78   Pulse 70   Ht 5\' 6"  (1.676 m)   Wt 260 lb 9.6 oz (118.2 kg)   LMP 05/19/2015 (LMP Unknown)   SpO2 98%   BMI 42.06 kg/m   Vitals:   10/12/23 0909  BP: 118/78  Pulse: 70  Height: 5\' 6"  (1.676 m)  Weight: 260 lb 9.6 oz (118.2 kg)  SpO2: 98%  BMI (Calculated): 42.08    Physical Exam Vitals and nursing note reviewed.  Constitutional:      Appearance: Normal appearance. She is normal weight.  HENT:     Head: Normocephalic.  Eyes:      Extraocular Movements: Extraocular movements intact.     Conjunctiva/sclera: Conjunctivae normal.     Pupils: Pupils are equal, round, and reactive to light.  Cardiovascular:     Rate and Rhythm: Normal rate.  Pulmonary:     Effort: Pulmonary effort is normal.  Musculoskeletal:     Cervical back: Normal range of motion.  Neurological:     General: No focal deficit present.     Mental Status: She is alert and oriented to person, place, and time. Mental status is at baseline.  Psychiatric:        Mood and Affect: Mood normal.        Behavior: Behavior normal.        Thought Content: Thought content normal.        Judgment: Judgment normal.      No results found for any visits on 10/12/23.  No results found for this or any previous visit (from the past 2160 hours).     Assessment & Plan:   Problem List Items Addressed This Visit       Cardiovascular and Mediastinum   Essential hypertension   Blood pressure well controlled with current medications.  Continue current therapy.  Will reassess at follow up.        Relevant Orders   CMP14+EGFR   CBC with Diff     Other   Hyperlipidemia   Checking labs today.  Continue current therapy for lipid control. Will modify as needed based on labwork results.        Relevant Orders   Lipid panel   CMP14+EGFR   CBC with Diff   Severe obesity with body mass index (BMI) of 35.0 to 39.9 with comorbidity (HCC)   Continue current meds.  Will adjust as needed based on results.  The patient is asked to make an attempt to improve diet and exercise patterns to aid in medical management of this problem. Addressed importance of increasing and maintaining water intake.        Relevant Orders   CMP14+EGFR   CBC with Diff   Vitamin  D deficiency, unspecified   Checking labs today.  Will continue supplements as needed.        Relevant Orders   VITAMIN D 25 Hydroxy (Vit-D Deficiency, Fractures)   CMP14+EGFR   CBC with Diff   B12  deficiency due to diet   Checking labs today.  Will continue supplements as needed.        Relevant Orders   CMP14+EGFR   Vitamin B12   CBC with Diff   Prediabetes   A1C Continues to be in prediabetic ranges.  Will reassess at follow up after next lab check.  Patient counseled on dietary choices and verbalized understanding.        Relevant Orders   CMP14+EGFR   Hemoglobin A1c   CBC with Diff   Other Visit Diagnoses       Encounter for preoperative examination for general surgical procedure    -  Primary   EKG shows sinus bradycardia. Patient advised to hold clopidogrel starting tomorrow.   Relevant Orders   EKG 12-Lead   CMP14+EGFR   CBC with Diff     Hypothyroidism (acquired)       Relevant Orders   CMP14+EGFR   TSH   CBC with Diff       Return in about 4 months (around 02/11/2024).   Total time spent: 20 minutes  Miki Kins, FNP  10/12/2023   This document may have been prepared by [facility] Voice Recognition software and as such may include unintentional dictation errors.

## 2023-10-12 NOTE — Assessment & Plan Note (Signed)
 Checking labs today.  Will continue supplements as needed.

## 2023-10-12 NOTE — Assessment & Plan Note (Signed)
 Continue current meds.  Will adjust as needed based on results.  The patient is asked to make an attempt to improve diet and exercise patterns to aid in medical management of this problem. Addressed importance of increasing and maintaining water intake.

## 2023-10-12 NOTE — Assessment & Plan Note (Signed)
 A1C Continues to be in prediabetic ranges.  Will reassess at follow up after next lab check.  Patient counseled on dietary choices and verbalized understanding.

## 2023-10-12 NOTE — Assessment & Plan Note (Signed)
 Checking labs today.  Continue current therapy for lipid control. Will modify as needed based on labwork results.

## 2023-10-12 NOTE — Patient Instructions (Addendum)
 Stop Clopidgrel starting tomorrow until after your colonoscopy.

## 2023-10-12 NOTE — Assessment & Plan Note (Signed)
 Blood pressure well controlled with current medications.  Continue current therapy.  Will reassess at follow up.

## 2023-10-15 DIAGNOSIS — J309 Allergic rhinitis, unspecified: Secondary | ICD-10-CM | POA: Diagnosis not present

## 2023-10-15 DIAGNOSIS — Z03818 Encounter for observation for suspected exposure to other biological agents ruled out: Secondary | ICD-10-CM | POA: Diagnosis not present

## 2023-10-18 ENCOUNTER — Encounter
Admission: RE | Disposition: A | Discharge status: Home / Self Care | Payer: Self-pay | Source: Home / Self Care | Attending: Gastroenterology

## 2023-10-18 ENCOUNTER — Encounter: Payer: Self-pay | Admitting: Gastroenterology

## 2023-10-18 ENCOUNTER — Ambulatory Visit: Admitting: General Practice

## 2023-10-18 ENCOUNTER — Other Ambulatory Visit: Payer: Self-pay

## 2023-10-18 ENCOUNTER — Ambulatory Visit
Admission: RE | Admit: 2023-10-18 | Discharge: 2023-10-18 | Disposition: A | Discharge status: Home / Self Care | Payer: Medicare HMO | Attending: Gastroenterology | Admitting: Gastroenterology

## 2023-10-18 DIAGNOSIS — Z1211 Encounter for screening for malignant neoplasm of colon: Secondary | ICD-10-CM | POA: Diagnosis not present

## 2023-10-18 DIAGNOSIS — Z6841 Body Mass Index (BMI) 40.0 and over, adult: Secondary | ICD-10-CM | POA: Insufficient documentation

## 2023-10-18 DIAGNOSIS — I252 Old myocardial infarction: Secondary | ICD-10-CM | POA: Insufficient documentation

## 2023-10-18 DIAGNOSIS — E785 Hyperlipidemia, unspecified: Secondary | ICD-10-CM | POA: Insufficient documentation

## 2023-10-18 DIAGNOSIS — I2510 Atherosclerotic heart disease of native coronary artery without angina pectoris: Secondary | ICD-10-CM | POA: Insufficient documentation

## 2023-10-18 DIAGNOSIS — E66813 Obesity, class 3: Secondary | ICD-10-CM | POA: Insufficient documentation

## 2023-10-18 DIAGNOSIS — I4901 Ventricular fibrillation: Secondary | ICD-10-CM | POA: Diagnosis not present

## 2023-10-18 DIAGNOSIS — Z96651 Presence of right artificial knee joint: Secondary | ICD-10-CM | POA: Diagnosis not present

## 2023-10-18 DIAGNOSIS — D120 Benign neoplasm of cecum: Secondary | ICD-10-CM | POA: Diagnosis not present

## 2023-10-18 DIAGNOSIS — Z87891 Personal history of nicotine dependence: Secondary | ICD-10-CM | POA: Diagnosis not present

## 2023-10-18 DIAGNOSIS — K635 Polyp of colon: Secondary | ICD-10-CM | POA: Diagnosis not present

## 2023-10-18 DIAGNOSIS — Z8674 Personal history of sudden cardiac arrest: Secondary | ICD-10-CM | POA: Insufficient documentation

## 2023-10-18 DIAGNOSIS — I10 Essential (primary) hypertension: Secondary | ICD-10-CM | POA: Insufficient documentation

## 2023-10-18 DIAGNOSIS — I739 Peripheral vascular disease, unspecified: Secondary | ICD-10-CM | POA: Insufficient documentation

## 2023-10-18 DIAGNOSIS — Z79899 Other long term (current) drug therapy: Secondary | ICD-10-CM | POA: Insufficient documentation

## 2023-10-18 DIAGNOSIS — Z955 Presence of coronary angioplasty implant and graft: Secondary | ICD-10-CM | POA: Diagnosis not present

## 2023-10-18 HISTORY — DX: Peripheral vascular disease, unspecified: I73.9

## 2023-10-18 HISTORY — DX: ST elevation (STEMI) myocardial infarction of unspecified site: I21.3

## 2023-10-18 HISTORY — PX: POLYPECTOMY: SHX149

## 2023-10-18 HISTORY — PX: COLONOSCOPY WITH PROPOFOL: SHX5780

## 2023-10-18 HISTORY — DX: Deficiency of other specified B group vitamins: E53.8

## 2023-10-18 HISTORY — DX: Chronic ischemic heart disease, unspecified: I25.9

## 2023-10-18 HISTORY — DX: Osteoarthritis of knee, unspecified: M17.9

## 2023-10-18 HISTORY — DX: Vitamin D deficiency, unspecified: E55.9

## 2023-10-18 SURGERY — COLONOSCOPY WITH PROPOFOL
Anesthesia: General

## 2023-10-18 MED ORDER — PROPOFOL 1000 MG/100ML IV EMUL
INTRAVENOUS | Status: AC
  Filled 2023-10-18: qty 100

## 2023-10-18 MED ORDER — GLYCOPYRROLATE 0.2 MG/ML IJ SOLN
INTRAMUSCULAR | Status: DC | PRN
Start: 2023-10-18 — End: 2023-10-18
  Administered 2023-10-18: .2 mg via INTRAVENOUS

## 2023-10-18 MED ORDER — LIDOCAINE HCL (PF) 2 % IJ SOLN
INTRAMUSCULAR | Status: AC
  Filled 2023-10-18: qty 5

## 2023-10-18 MED ORDER — PHENYLEPHRINE 80 MCG/ML (10ML) SYRINGE FOR IV PUSH (FOR BLOOD PRESSURE SUPPORT)
PREFILLED_SYRINGE | INTRAVENOUS | Status: AC
  Filled 2023-10-18: qty 10

## 2023-10-18 MED ORDER — LIDOCAINE HCL (CARDIAC) PF 100 MG/5ML IV SOSY
PREFILLED_SYRINGE | INTRAVENOUS | Status: DC | PRN
  Administered 2023-10-18: 100 mg via INTRAVENOUS

## 2023-10-18 MED ORDER — PROPOFOL 10 MG/ML IV BOLUS
INTRAVENOUS | Status: DC | PRN
  Administered 2023-10-18: 50 mg via INTRAVENOUS

## 2023-10-18 MED ORDER — SODIUM CHLORIDE 0.9 % IV SOLN: INTRAVENOUS | Status: DC

## 2023-10-18 MED ORDER — PROPOFOL 500 MG/50ML IV EMUL
INTRAVENOUS | Status: DC | PRN
  Administered 2023-10-18: 75 ug/kg/min via INTRAVENOUS

## 2023-10-18 NOTE — Anesthesia Postprocedure Evaluation (Signed)
 Anesthesia Post Note  Patient: Sarah Medina  Procedure(s) Performed: COLONOSCOPY WITH PROPOFOL POLYPECTOMY, INTESTINE  Patient location during evaluation: Endoscopy Anesthesia Type: General Level of consciousness: awake and alert Pain management: pain level controlled Vital Signs Assessment: post-procedure vital signs reviewed and stable Respiratory status: spontaneous breathing, nonlabored ventilation, respiratory function stable and patient connected to nasal cannula oxygen Cardiovascular status: blood pressure returned to baseline and stable Postop Assessment: no apparent nausea or vomiting Anesthetic complications: no  No notable events documented.   Last Vitals:  Vitals:   10/18/23 1017 10/18/23 1028  BP: (!) 99/56 108/61  Pulse: 62   Resp: (!) 21   Temp:    SpO2: 96%     Last Pain:  Vitals:   10/18/23 1017  TempSrc:   PainSc: 0-No pain                 Enrique Harvest

## 2023-10-18 NOTE — Interval H&P Note (Signed)
 History and Physical Interval Note: Preprocedure H&P from 10/18/23  was reviewed and there was no interval change after seeing and examining the patient.  Written consent was obtained from the patient after discussion of risks, benefits, and alternatives. Patient has consented to proceed with Colonoscopy with possible intervention   10/18/2023 9:27 AM  Sarah Medina  has presented today for surgery, with the diagnosis of Z12.11 (ICD-10-CM) - Colon cancer screening.  The various methods of treatment have been discussed with the patient and family. After consideration of risks, benefits and other options for treatment, the patient has consented to  Procedure(s) with comments: COLONOSCOPY WITH PROPOFOL (N/A) - Patient has Insurance account manager, so I gave her arrival time of 8am so that she can have transportation arranged.  Her spouse will be with her as well (LG) as a surgical intervention.  The patient's history has been reviewed, patient examined, no change in status, stable for surgery.  I have reviewed the patient's chart and labs.  Questions were answered to the patient's satisfaction.     Quintin Buckle

## 2023-10-18 NOTE — H&P (Signed)
 Pre-Procedure H&P   Patient ID: Sarah Medina is a 58 y.o. female.  Gastroenterology Provider: Jaynie Collins, DO  Referring Provider: Jacob Moores, PA PCP: Miki Kins, FNP  Date: 10/18/2023  HPI Sarah Medina is a 58 y.o. female who presents today for Colonoscopy for Colorectal cancer screening .  No family history of colon cancer or colon polyps. She has not undergone colonoscopy before.  Colonoscopy attempted 2019 but canceled secondary to poor prep She does notice some blood with wiping at times  Most recent hemoglobin was 10.1 MCV 88 platelets 213,000 creatinine 0.9  She is no longer on Plavix   Past Medical History:  Diagnosis Date   Anemia    Anginal pain (HCC)    Arthritis    B12 deficiency    CAD in native artery 07/20/2015   a.) LHC/PCI: 50% dLCx, 100% pRCA (2.25 x 23 mm Xience Alpine DES); b.) LHC/PTCA 07/23/2015: 80% pLAD, 100% thrombotic occlusion of pRCA stent (PTCA). Due to hemodynamic instability, PCI of LAD planed for later date; c.) LHC/PCI 07/12/2016: 50% LM, 70% mRCA, 80% pLAD (3.25 x 18 mm Xience Alpine DES); d.) MV 09/20/2018: no ischemia; e.) MV 02/08/2021: no ischemia; f.) MV 05/17/2023: no ischemia   Cardiac arrest with ventricular fibrillation (HCC) 07/23/2015   a.) developed in setting of inferior STEMI when patient was transferred from stretcher to cath lab table --> required defibrillation x 4. (+) hemodynamic instability with SBPs in the 70s. Started on dopamine and amiodarone. Plans for further staged PCI at a later date.   Carotid artery occlusion    Cellulitis of left lower extremity    Chronic ischemic heart disease    Hypercholesteremia    Hypertension    Long term current use of clopidogrel    Long-term use of aspirin therapy    Osteoarthritis of knee    Periorbital edema    treated with steroids   Peripheral vascular disease (HCC)    Pre-diabetes    Seizures (HCC)    as teenager, ages [age].  none since    Sepsis (HCC) 12/25/2018   ST elevation (STEMI) myocardial infarction [facility])    ST elevation myocardial infarction (STEMI) of inferior wall (HCC) 07/23/2015   a.) c/b V.fib arrest requiring defibrillation x 4; b.) relook LHC/PTCA: 80% pLAD, 100% thrombotic occlusion pRCA (PTCA performed) --> plans for staged PCI of LAD due to hemodynamic instability   ST elevation myocardial infarction (STEMI) of inferior wall (HCC) 07/20/2015   a.) LHC/PCI 07/20/2015: 50% dLCx, 100% pRCA (2.25 x 23 mm Xience Alpine DES)   Umbilical hernia    Uterine fibroid    Ventricular fibrillation (HCC)    Vitamin D deficiency     Past Surgical History:  Procedure Laterality Date   CARDIAC CATHETERIZATION N/A 07/20/2015   Procedure: Left Heart Cath and Coronary Angiography;  Surgeon: Marcina Millard, MD;  Location: [facility] INVASIVE CV LAB;  Service: Cardiovascular;  Laterality: N/A;   CARDIAC CATHETERIZATION N/A 07/20/2015   Procedure: Coronary Stent Intervention;  Surgeon: Marcina Millard, MD;  Location: [facility] INVASIVE CV LAB;  Service: Cardiovascular;  Laterality: N/A;   CARDIAC CATHETERIZATION N/A 07/23/2015   Procedure: Left Heart Cath and Coronary Angiography;  Surgeon: Runell Gess, MD;  Location: [facility] INVASIVE CV LAB;  Service: Cardiovascular;  Laterality: N/A;   CARDIAC CATHETERIZATION N/A 07/23/2015   Procedure: Coronary Balloon Angioplasty;  Surgeon: Runell Gess, MD;  Location: [facility] INVASIVE CV LAB;  Service: Cardiovascular;  Laterality: N/A;  CARDIAC CATHETERIZATION N/A 07/12/2016   Procedure: Left Heart Cath;  Surgeon: Ronney Cola, MD;  Location: [facility] INVASIVE CV LAB;  Service: Cardiovascular;  Laterality: N/A;   CARDIAC CATHETERIZATION N/A 07/12/2016   Procedure: Left Heart Cath and Coronary Angiography;  Surgeon: Percival Brace, MD;  Location: [facility] INVASIVE CV LAB;  Service: Cardiovascular;  Laterality: N/A;   CARDIAC CATHETERIZATION N/A 07/12/2016   Procedure: Intravascular Pressure  Wire/FFR Study;  Surgeon: Percival Brace, MD;  Location: [facility] INVASIVE CV LAB;  Service: Cardiovascular;  Laterality: N/A;   CARDIAC CATHETERIZATION N/A 07/12/2016   Procedure: Coronary Stent Intervention;  Surgeon: Percival Brace, MD;  Location: [facility] INVASIVE CV LAB;  Service: Cardiovascular;  Laterality: N/A;   CORONARY ARTERY BYPASS GRAFT     JOINT REPLACEMENT     KNEE SURGERY Right 15 years ago   S/P coronary artery stent placement N/A    S/P TKR (total knee replacement) using cement, right Right    TOTAL KNEE ARTHROPLASTY Right 06/07/2023   Procedure: TOTAL KNEE ARTHROPLASTY;  Surgeon: Venus Ginsberg, MD;  Location: [facility] ORS;  Service: Orthopedics;  Laterality: Right;    Family History No h/o GI disease or malignancy  Review of Systems  Constitutional:  Negative for activity change, appetite change, chills, diaphoresis, fatigue, fever and unexpected weight change.  HENT:  Negative for trouble swallowing and voice change.   Respiratory:  Negative for shortness of breath and wheezing.   Cardiovascular:  Negative for chest pain, palpitations and leg swelling.  Gastrointestinal:  Positive for anal bleeding. Negative for abdominal distention, abdominal pain, blood in stool, constipation, diarrhea, nausea, rectal pain and vomiting.  Musculoskeletal:  Negative for arthralgias and myalgias.  Skin:  Negative for color change and pallor.  Neurological:  Negative for dizziness, syncope and weakness.  Psychiatric/Behavioral:  Negative for confusion.   All other systems reviewed and are negative.    Medications No current facility-administered medications on file prior to encounter.   Current Outpatient Medications on File Prior to Encounter  Medication Sig Dispense Refill   aspirin EC 81 MG tablet Take 1 tablet (81 mg total) by mouth 2 (two) times daily. 30 tablet 11   atorvastatin (LIPITOR) 80 MG tablet Take 1 tablet (80 mg total) by mouth at bedtime. 90 tablet 1   ferrous  sulfate 325 (65 FE) MG tablet Take 325 mg by mouth daily.     losartan (COZAAR) 50 MG tablet TAKE 1 TABLET BY MOUTH EVERY DAY 90 tablet 1   metoprolol tartrate (LOPRESSOR) 25 MG tablet Take 0.5 tablets (12.5 mg total) by mouth 2 (two) times daily. 180 tablet 1   acetaminophen (TYLENOL) 500 MG tablet Take 1,000 mg by mouth every 6 (six) hours as needed.     cholecalciferol (VITAMIN D3) 25 MCG (1000 UNIT) tablet Take 1,000 Units by mouth daily.     clopidogrel (PLAVIX) 75 MG tablet Take 75 mg by mouth daily.     docusate sodium (COLACE) 100 MG capsule Take 1 capsule (100 mg total) by mouth 2 (two) times daily. 10 capsule 0   nitroGLYCERIN (NITROSTAT) 0.4 MG SL tablet Place 0.4 mg under the tongue every 5 (five) minutes as needed for chest pain.     oxyCODONE (OXY IR/ROXICODONE) 5 MG immediate release tablet Take 0.5 tablets (2.5 mg total) by mouth every 6 (six) hours as needed for breakthrough pain. 20 tablet 0   traMADol (ULTRAM) 50 MG tablet Take 1 tablet (50 mg total) by mouth every 6 (six) hours as needed  for moderate pain (pain score 4-6). (Patient not taking: Reported on 10/12/2023) 30 tablet 0    Pertinent medications related to GI and procedure were reviewed by me with the patient prior to the procedure   Current Facility-Administered Medications:    0.9 %  sodium chloride infusion, , Intravenous, Continuous, Quintin Buckle, DO, Last Rate: 20 mL/hr at 10/18/23 0847, Continued from Pre-op at 10/18/23 0847  sodium chloride 20 mL/hr at 10/18/23 0847       Allergies  Allergen Reactions   Enalapril Swelling    Swelling face   Allergies were reviewed by me prior to the procedure  Objective   Body mass index is 41.16 kg/m. Vitals:   10/18/23 0841  BP: 131/71  Pulse: (!) 54  Resp: 20  Temp: (!) 96.1 F (35.6 C)  TempSrc: Temporal  SpO2: 100%  Weight: 115.7 kg  Height: 5\' 6"  (1.676 m)     Physical Exam Vitals and nursing note reviewed.  Constitutional:       General: She is not in acute distress.    Appearance: Normal appearance. She is obese. She is not ill-appearing, toxic-appearing or diaphoretic.  HENT:     Head: Normocephalic and atraumatic.     Nose: Nose normal.     Mouth/Throat:     Mouth: Mucous membranes are moist.     Pharynx: Oropharynx is clear.  Eyes:     General: No scleral icterus.    Extraocular Movements: Extraocular movements intact.  Cardiovascular:     Rate and Rhythm: Regular rhythm. Bradycardia present.     Heart sounds: Normal heart sounds. No murmur heard.    No friction rub. No gallop.  Pulmonary:     Effort: Pulmonary effort is normal. No respiratory distress.     Breath sounds: Normal breath sounds. No wheezing, rhonchi or rales.  Abdominal:     General: Abdomen is flat. Bowel sounds are normal. There is no distension.     Palpations: Abdomen is soft.     Tenderness: There is no abdominal tenderness. There is no guarding or rebound.  Musculoskeletal:     Cervical back: Neck supple.     Right lower leg: No edema.     Left lower leg: No edema.  Skin:    General: Skin is warm and dry.     Coloration: Skin is not jaundiced or pale.  Neurological:     General: No focal deficit present.     Mental Status: She is alert and oriented to person, place, and time. Mental status is at baseline.  Psychiatric:        Mood and Affect: Mood normal.        Behavior: Behavior normal.        Thought Content: Thought content normal.        Judgment: Judgment normal.      Assessment:  Sarah Medina is a 58 y.o. female  who presents today for Colonoscopy for Colorectal cancer screening .  Plan:  Colonoscopy with possible intervention today  Colonoscopy with possible biopsy, control of bleeding, polypectomy, and interventions as necessary has been discussed with the patient/patient representative. Informed consent was obtained from the patient/patient representative after explaining the indication, nature, and  risks of the procedure including but not limited to death, bleeding, perforation, missed neoplasm/lesions, cardiorespiratory compromise, and reaction to medications. Opportunity for questions was given and appropriate answers were provided. Patient/patient representative has verbalized understanding is amenable to undergoing the procedure.   Landon Pinion  Michael Kenslie Abbruzzese, DO  [facility]astro[facility]  Portions of the record may have been created with voice recognition software. Occasional wrong-word or 'sound-a-like' substitutions may have occurred due to the inherent limitations of voice recognition software.  Read the chart carefully and recognize, using context, where substitutions may have occurred.

## 2023-10-18 NOTE — Op Note (Signed)
 [facility] Gastroenterology Patient Name: Sarah Medina Procedure Date: 10/18/2023 9:21 AM MRN: [id number] Account #: [id number] Date of Birth: 03-Aug-1965 Admit Type: Outpatient Age: 58 Room: [facility] ENDO ROOM 1 Gender: Female Note Status: Finalized Instrument Name: Colonscope [id number] Procedure:             Colonoscopy Indications:           Screening for colorectal malignant neoplasm Providers:             Trenda Moots, DO Referring MD:          Grayling Congress FNP Medicines:             Monitored Anesthesia Care Complications:         No immediate complications. Estimated blood loss:                         Minimal. Procedure:             Pre-Anesthesia Assessment:                        - Prior to the procedure, a History and Physical was                         performed, and patient medications and allergies were                         reviewed. The patient is competent. The risks and                         benefits of the procedure and the sedation options and                         risks were discussed with the patient. All questions                         were answered and informed consent was obtained.                         Patient identification and proposed procedure were                         verified by the physician, the nurse, the anesthetist                         and the technician in the endoscopy suite. Mental                         Status Examination: alert and oriented. Airway                         Examination: normal oropharyngeal airway and neck                         mobility. Respiratory Examination: clear to                         auscultation. CV Examination: bradycardia noted.  Prophylactic Antibiotics: The patient does not require                         prophylactic antibiotics. Prior Anticoagulants: The                         patient has taken no anticoagulant or antiplatelet                          agents. ASA Grade Assessment: III - A patient with                         severe systemic disease. After reviewing the risks and                         benefits, the patient was deemed in satisfactory                         condition to undergo the procedure. The anesthesia                         plan was to use monitored anesthesia care (MAC).                         Immediately prior to administration of medications,                         the patient was re-assessed for adequacy to receive                         sedatives. The heart rate, respiratory rate, oxygen                         saturations, blood pressure, adequacy of pulmonary                         ventilation, and response to care were monitored                         throughout the procedure. The physical status of the                         patient was re-assessed after the procedure.                        After obtaining informed consent, the colonoscope was                         passed under direct vision. Throughout the procedure,                         the patient's blood pressure, pulse, and oxygen                         saturations were monitored continuously. The                         Colonoscope was introduced through the anus and  advanced to the the terminal ileum, with                         identification of the appendiceal orifice and IC                         valve. The colonoscopy was performed without                         difficulty. The patient tolerated the procedure well.                         The quality of the bowel preparation was evaluated                         using the BBPS [facility] Bowel Preparation Scale) with                         scores of: Right Colon = 2 (minor amount of residual                         staining, small fragments of stool and/or opaque                         liquid, but mucosa seen well), Transverse Colon = 3                          (entire mucosa seen well with no residual staining,                         small fragments of stool or opaque liquid) and Left                         Colon = 2 (minor amount of residual staining, small                         fragments of stool and/or opaque liquid, but mucosa                         seen well). The total BBPS score equals 7. The quality                         of the bowel preparation was good. The terminal ileum,                         ileocecal valve, appendiceal orifice, and rectum were                         photographed. Findings:      The perianal and digital rectal examinations were normal. Pertinent       negatives include normal sphincter tone.      The terminal ileum appeared normal. Estimated blood loss: none.      Retroflexion in the right colon was performed.      Two sessile polyps were found in the proximal ascending colon and cecum.       The polyps were 1 to 2 mm in size. These  polyps were removed with a       jumbo cold forceps. Resection and retrieval were complete. Estimated       blood loss was minimal.      The exam was otherwise without abnormality on direct and retroflexion       views. Impression:            - The examined portion of the ileum was normal.                        - Two 1 to 2 mm polyps in the proximal ascending colon                         and in the cecum, removed with a jumbo cold forceps.                         Resected and retrieved.                        - The examination was otherwise normal on direct and                         retroflexion views. Recommendation:        - Patient has a contact number available for                         emergencies. The signs and symptoms of potential                         delayed complications were discussed with the patient.                         Return to normal activities tomorrow. Written                         discharge instructions were provided to the  patient.                        - Discharge patient to home.                        - Resume previous diet.                        - Continue present medications.                        - Await pathology results.                        - Repeat colonoscopy for surveillance based on                         pathology results.                        - Return to referring physician as previously                         scheduled.                        -  The findings and recommendations were discussed with                         the patient. Procedure Code(s):     --- Professional ---                        [phone], Colonoscopy, flexible; with biopsy, single or                         multiple Diagnosis Code(s):     --- Professional ---                        Z12.11, Encounter for screening for malignant neoplasm                         of colon                        D12.2, Benign neoplasm of ascending colon                        D12.0, Benign neoplasm of cecum CPT copyright 2022 [employer]. All rights reserved. The codes documented in this report are preliminary and upon coder review may  be revised to meet current compliance requirements. Attending Participation:      I personally performed the entire procedure. Polo Brisk, DO Quintin Buckle DO, DO 10/18/2023 9:59:31 AM This report has been signed electronically. Number of Addenda: 0 Note Initiated On: 10/18/2023 9:21 AM Scope Withdrawal Time: 0 hours 11 minutes 52 seconds  Total Procedure Duration: 0 hours 15 minutes 30 seconds  Estimated Blood Loss:  Estimated blood loss was minimal.      [facility]

## 2023-10-18 NOTE — Anesthesia Preprocedure Evaluation (Signed)
 Anesthesia Evaluation  Patient identified by MRN, date of birth, ID band Patient awake    Reviewed: Allergy & Precautions, NPO status , Patient's Chart, lab work & pertinent test results  History of Anesthesia Complications Negative for: history of anesthetic complications  Airway Mallampati: III  TM Distance: >3 FB Neck ROM: Full    Dental no notable dental hx. (+) Teeth Intact   Pulmonary shortness of breath and with exertion, neg sleep apnea, neg COPD, Patient abstained from smoking.Not current smoker   Pulmonary exam normal breath sounds clear to auscultation       Cardiovascular Exercise Tolerance: Poor hypertension, + CAD, + Past MI, + Cardiac Stents and + Peripheral Vascular Disease  + dysrhythmias Ventricular Fibrillation  Rhythm:Regular Rate:Normal - Systolic murmurs  Patient suffered an inferior wall STEMI on 07/20/2015.  Diagnostic LEFT heart catheterization was performed revealing multivessel CAD; 50% distal LCx and 100% proximal RCA.  PCI was subsequently performed placing a 2.25 x 23 mm Xience Alpine DES x 1 to the proximal RCA lesion.  Procedure yielded excellent angiographic result and TIMI-3 flow.    Two days following her initial STEMI, patient demonstrated signs of a second inferior STEMI.  Patient was taken back to the cardiac catheterization lab.1 patient was transferred from the stretcher to the catheterization lab table, patient experienced ventricular fibrillation cardiac arrest.  She required defibrillation's x 4.  Patient overall hemodynamically unstable with SBP in the 70s.  She was started on amiodarone and dopamine drips.  Repeat cardiac catheterization revealed 80% stenosis of the proximal LAD and 100% thrombotic occlusion of the previously placed proximal RCA stent.  PTCA was performed to the proximal RCA restoring TIMI-3 flow.  Given patient's instability, further intervention was deferred at that time.   Plans were for staged PCI of the LAD lesion at a later date.    Diagnostic LEFT heart catheterization was performed on 07/12/2016 revealing multivessel CAD; 50% LM, 70% mid RCA, and 80% proximal LAD.  Previously placed stent to the proximal RCA was widely patent.  PCI was subsequently performed placing a 3.25 x 18 mm Xience Alpine DES x 1 to the proximal LAD lesion.  Procedure yielded excellent angiographic result and TIMI-3 flow.    Most recent TTE was performed on 03/09/2019 revealing a normal left ventricular systolic function with an EF of 55 to 60%.  There were no regional wall motion abnormalities.  Right ventricular size and function normal.  There was no significant valvular regurgitation. All transvalvular gradients were noted to be normal providing no evidence suggestive of valvular stenosis. Aorta normal in size with no evidence of aneurysmal dilatation.    Most recent myocardial perfusion imaging study was performed on 05/17/2023 revealing a normal left ventricular systolic function with an EF of 63%.  End systolic and diastolic cavity size noted to be normal.  Patient able to achieve 60% of her MPHR (max heart rate 98 bpm).  Peak METS achieved was 1.0.  There was no evidence of stress-induced myocardial ischemia or arrhythmia; no scintigraphic evidence of scar.  Patient reported fatigue and shortness of breath during the stress test.  Overall, study determined to be low risk.   Following previous thrombotic occlusion of cardiac stent, patient remains on daily DAPT therapy using ASA + clopidogrel.  Patient is reported to be compliant with therapy with no evidence or reports of GI bleeding. Blood pressure well controlled at 100/60 mmHg on currently prescribed diuretic (furosemide) + ARB (losartan) + beta-blocker (metoprolol tartrate) therapies.  Patient  is on atorvastatin for her HLD diagnosis and ASCVD prevention.  Patient has a supply of short acting nitrates (NTG) to use on a as needed basis  for recurrent angina/anginal equivalent symptoms; denied recent use.  Patient has a prediabetes diagnosis.  Her last hemoglobin A1c was 6.3% when checked on 05/05/2023. Patient is able to complete all of her  ADL/IADLs without cardiovascular limitation.  Per the DASI, patient is able to achieve at least 4 METS of physical activity without experiencing any significant degree of angina/anginal equivalent symptoms. No changes were made to her medication regimen during her visit with cardiology.  Patient scheduled to follow-up with outpatient cardiology in 6 months or sooner if needed.   Sarah Medina is scheduled for an elective TOTAL KNEE ARTHROPLASTY (Right: Knee) on 06/07/2023 with Dr. Venus Ginsberg, MD.  Given patient's past medical history significant for cardiovascular diagnoses, presurgical cardiac clearance was sought by the PAT team. Per cardiology, "this patient is optimized for surgery and may proceed with the planned procedural course with a MODERATE risk of significant perioperative cardiovascular complications".    Neuro/Psych neg Seizures negative neurological ROS  negative psych ROS   GI/Hepatic ,neg GERD  ,,(+)     (-) substance abuse    Endo/Other  neg diabetes  Class 3 obesity  Renal/GU      Musculoskeletal   Abdominal   Peds  Hematology On blood thinners, took plavix 3 days ago   Anesthesia Other Findings Past Medical History: No date: Anemia No date: Anginal pain (HCC) No date: Arthritis 07/20/2015: CAD in native artery     Comment:  a.) LHC/PCI: 50% dLCx, 100% pRCA (2.25 x 23 mm Xience               Alpine DES); b.) LHC/PTCA 07/23/2015: 80% pLAD, 100%               thrombotic occlusion of pRCA stent (PTCA). Due to               hemodynamic instability, PCI of LAD planed for later               date; c.) LHC/PCI 07/12/2016: 50% LM, 70% mRCA, 80% pLAD               (3.25 x 18 mm Xience Alpine DES); d.) MV 09/20/2018: no               ischemia; e.) MV  02/08/2021: no ischemia; f.) MV               05/17/2023: no ischemia 07/23/2015: Cardiac arrest with ventricular fibrillation (HCC)     Comment:  a.) developed in setting of inferior STEMI when patient               was transferred from stretcher to cath lab table -->               required defibrillation x 4. (+) hemodynamic instability               with SBPs in the 70s. Started on dopamine and amiodarone.              Plans for further staged PCI at a later date. No date: Carotid artery occlusion No date: Cellulitis of left lower extremity No date: Hypercholesteremia No date: Hypertension No date: Long term current use of clopidogrel No date: Long-term use of aspirin therapy No date: Periorbital edema     Comment:  treated with  steroids No date: Pre-diabetes No date: Seizures [facility])     Comment:  as teenager, ages [age].  none since 12/25/2018: Sepsis (HCC) 07/23/2015: ST elevation myocardial infarction (STEMI) of inferior  wall (HCC)     Comment:  a.) c/b V.fib arrest requiring defibrillation x 4; b.)               relook LHC/PTCA: 80% pLAD, 100% thrombotic occlusion pRCA              (PTCA performed) --> plans for staged PCI of LAD due to               hemodynamic instability 07/20/2015: ST elevation myocardial infarction (STEMI) of inferior  wall (HCC)     Comment:  a.) LHC/PCI 07/20/2015: 50% dLCx, 100% pRCA (2.25 x 23               mm Xience Alpine DES) No date: Umbilical hernia No date: Uterine fibroid  Reproductive/Obstetrics                             Anesthesia Physical Anesthesia Plan  ASA: 3  Anesthesia Plan: General   Post-op Pain Management: Minimal or no pain anticipated   Induction: Intravenous  PONV Risk Score and Plan: 3 and Propofol infusion, TIVA and Ondansetron  Airway Management Planned: Nasal Cannula  Additional Equipment: None  Intra-op Plan:   Post-operative Plan:   Informed Consent: I have reviewed the  patients History and Physical, chart, labs and discussed the procedure including the risks, benefits and alternatives for the proposed anesthesia with the patient or authorized representative who has indicated his/her understanding and acceptance.     Dental advisory given  Plan Discussed with: CRNA and Surgeon  Anesthesia Plan Comments: (Discussed risks of anesthesia with patient, including possibility of difficulty with spontaneous ventilation under anesthesia necessitating airway intervention, PONV, and rare risks such as cardiac or respiratory or neurological events, and allergic reactions. Discussed the role of CRNA in patient's perioperative care. Patient understands.)       Anesthesia Quick Evaluation

## 2023-10-18 NOTE — Transfer of Care (Signed)
 Immediate Anesthesia Transfer of Care Note  Patient: Sarah Medina  Procedure(s) Performed: COLONOSCOPY WITH PROPOFOL POLYPECTOMY, INTESTINE  Patient Location: PACU  Anesthesia Type:General  Level of Consciousness: sedated  Airway & Oxygen Therapy: Patient Spontanous Breathing  Post-op Assessment: Report given to RN and Post -op Vital signs reviewed and stable  Post vital signs: Reviewed and stable  Last Vitals:  Vitals Value Taken Time  BP 84/51 10/18/23 0956  Temp    Pulse    Resp 14 10/18/23 0956  SpO2 97 % 10/18/23 0956    Last Pain:  Vitals:   10/18/23 0956  TempSrc:   PainSc: Asleep         Complications: No notable events documented.

## 2023-10-19 LAB — SURGICAL PATHOLOGY

## 2023-10-23 ENCOUNTER — Other Ambulatory Visit: Payer: Self-pay | Admitting: Family

## 2023-10-23 DIAGNOSIS — Z1231 Encounter for screening mammogram for malignant neoplasm of breast: Secondary | ICD-10-CM

## 2023-10-26 ENCOUNTER — Other Ambulatory Visit

## 2023-10-26 DIAGNOSIS — Z01818 Encounter for other preprocedural examination: Secondary | ICD-10-CM | POA: Diagnosis not present

## 2023-10-26 DIAGNOSIS — I10 Essential (primary) hypertension: Secondary | ICD-10-CM | POA: Diagnosis not present

## 2023-10-26 DIAGNOSIS — E782 Mixed hyperlipidemia: Secondary | ICD-10-CM | POA: Diagnosis not present

## 2023-10-26 DIAGNOSIS — R7303 Prediabetes: Secondary | ICD-10-CM | POA: Diagnosis not present

## 2023-10-26 DIAGNOSIS — E538 Deficiency of other specified B group vitamins: Secondary | ICD-10-CM | POA: Diagnosis not present

## 2023-10-26 DIAGNOSIS — E559 Vitamin D deficiency, unspecified: Secondary | ICD-10-CM | POA: Diagnosis not present

## 2023-10-29 LAB — CBC WITH DIFFERENTIAL/PLATELET
Basophils Absolute: 0 10*3/uL (ref 0.0–0.2)
Basos: 1 %
EOS (ABSOLUTE): 0.2 10*3/uL (ref 0.0–0.4)
Eos: 3 %
Hematocrit: 41.2 % (ref 34.0–46.6)
Hemoglobin: 12.7 g/dL (ref 11.1–15.9)
Immature Grans (Abs): 0 10*3/uL (ref 0.0–0.1)
Immature Granulocytes: 0 %
Lymphocytes Absolute: 1.5 10*3/uL (ref 0.7–3.1)
Lymphs: 27 %
MCH: 26.5 pg — ABNORMAL LOW (ref 26.6–33.0)
MCHC: 30.8 g/dL — ABNORMAL LOW (ref 31.5–35.7)
MCV: 86 fL (ref 79–97)
Monocytes Absolute: 0.5 10*3/uL (ref 0.1–0.9)
Monocytes: 8 %
Neutrophils Absolute: 3.3 10*3/uL (ref 1.4–7.0)
Neutrophils: 61 %
Platelets: 140 10*3/uL — ABNORMAL LOW (ref 150–450)
RBC: 4.8 x10E6/uL (ref 3.77–5.28)
RDW: 14.8 % (ref 11.7–15.4)
WBC: 5.5 10*3/uL (ref 3.4–10.8)

## 2023-10-29 LAB — LIPID PANEL
Chol/HDL Ratio: 2.5 ratio (ref 0.0–4.4)
Cholesterol, Total: 148 mg/dL (ref 100–199)
HDL: 59 mg/dL (ref >39)
LDL Chol Calc (NIH): 75 mg/dL (ref 0–99)
Triglycerides: 74 mg/dL (ref 0–149)
VLDL Cholesterol Cal: 14 mg/dL (ref 5–40)

## 2023-10-29 LAB — HEMOGLOBIN A1C
Est. average glucose Bld gHb Est-mCnc: 128 mg/dL
Hgb A1c MFr Bld: 6.1 % — ABNORMAL HIGH (ref 4.8–5.6)

## 2023-10-29 LAB — CMP14+EGFR
ALT: 10 IU/L (ref 0–32)
AST: 16 IU/L (ref 0–40)
Albumin: 4.1 g/dL (ref 3.8–4.9)
Alkaline Phosphatase: 117 IU/L (ref 44–121)
BUN/Creatinine Ratio: 19 (ref 9–23)
BUN: 16 mg/dL (ref 6–24)
Bilirubin Total: 0.4 mg/dL (ref 0.0–1.2)
CO2: 20 mmol/L (ref 20–29)
Calcium: 9.4 mg/dL (ref 8.7–10.2)
Chloride: 108 mmol/L — ABNORMAL HIGH (ref 96–106)
Creatinine, Ser: 0.86 mg/dL (ref 0.57–1.00)
Globulin, Total: 2.8 g/dL (ref 1.5–4.5)
Glucose: 88 mg/dL (ref 70–99)
Potassium: 4.5 mmol/L (ref 3.5–5.2)
Sodium: 147 mmol/L — ABNORMAL HIGH (ref 134–144)
Total Protein: 6.9 g/dL (ref 6.0–8.5)
eGFR: 78 mL/min/{1.73_m2} (ref >59)

## 2023-10-29 LAB — VITAMIN D 25 HYDROXY (VIT D DEFICIENCY, FRACTURES): Vit D, 25-Hydroxy: 33 ng/mL (ref 30.0–100.0)

## 2023-10-29 LAB — TSH: TSH: 1.92 u[IU]/mL (ref 0.450–4.500)

## 2023-10-29 LAB — VITAMIN B12: Vitamin B-12: 658 pg/mL (ref 232–1245)

## 2023-11-05 ENCOUNTER — Ambulatory Visit: Admitting: Family Medicine

## 2023-11-06 ENCOUNTER — Ambulatory Visit
Admission: RE | Admit: 2023-11-06 | Discharge: 2023-11-06 | Disposition: A | Discharge status: Home / Self Care | Source: Ambulatory Visit | Attending: Family | Admitting: Family

## 2023-11-06 DIAGNOSIS — Z1231 Encounter for screening mammogram for malignant neoplasm of breast: Secondary | ICD-10-CM | POA: Diagnosis not present

## 2023-11-22 DIAGNOSIS — I4901 Ventricular fibrillation: Secondary | ICD-10-CM | POA: Diagnosis not present

## 2023-11-22 DIAGNOSIS — Z955 Presence of coronary angioplasty implant and graft: Secondary | ICD-10-CM | POA: Diagnosis not present

## 2023-11-22 DIAGNOSIS — I2119 ST elevation (STEMI) myocardial infarction involving other coronary artery of inferior wall: Secondary | ICD-10-CM | POA: Diagnosis not present

## 2023-11-22 DIAGNOSIS — I10 Essential (primary) hypertension: Secondary | ICD-10-CM | POA: Diagnosis not present

## 2024-01-16 ENCOUNTER — Other Ambulatory Visit: Payer: Self-pay

## 2024-01-16 MED ORDER — FUROSEMIDE 20 MG PO TABS: 40.0000 mg | ORAL_TABLET | Freq: Every day | ORAL | 0 refills | Status: DC

## 2024-02-06 DIAGNOSIS — H02889 Meibomian gland dysfunction of unspecified eye, unspecified eyelid: Secondary | ICD-10-CM | POA: Diagnosis not present

## 2024-02-11 ENCOUNTER — Ambulatory Visit: Admitting: Family

## 2024-02-26 ENCOUNTER — Ambulatory Visit: Admitting: Internal Medicine

## 2024-03-17 ENCOUNTER — Ambulatory Visit: Admitting: Nurse Practitioner

## 2024-03-17 ENCOUNTER — Encounter: Payer: Self-pay | Admitting: Nurse Practitioner

## 2024-03-17 VITALS — BP 132/80 | HR 75 | Temp 97.9°F | Resp 18 | Ht 66.0 in | Wt 286.8 lb

## 2024-03-17 DIAGNOSIS — Z1211 Encounter for screening for malignant neoplasm of colon: Secondary | ICD-10-CM

## 2024-03-17 DIAGNOSIS — I10 Essential (primary) hypertension: Secondary | ICD-10-CM

## 2024-03-17 DIAGNOSIS — M1711 Unilateral primary osteoarthritis, right knee: Secondary | ICD-10-CM | POA: Diagnosis not present

## 2024-03-17 DIAGNOSIS — R7303 Prediabetes: Secondary | ICD-10-CM

## 2024-03-17 DIAGNOSIS — E785 Hyperlipidemia, unspecified: Secondary | ICD-10-CM

## 2024-03-17 DIAGNOSIS — H6123 Impacted cerumen, bilateral: Secondary | ICD-10-CM

## 2024-03-17 DIAGNOSIS — I739 Peripheral vascular disease, unspecified: Secondary | ICD-10-CM

## 2024-03-17 DIAGNOSIS — I252 Old myocardial infarction: Secondary | ICD-10-CM | POA: Insufficient documentation

## 2024-03-17 DIAGNOSIS — Z955 Presence of coronary angioplasty implant and graft: Secondary | ICD-10-CM

## 2024-03-17 DIAGNOSIS — E538 Deficiency of other specified B group vitamins: Secondary | ICD-10-CM | POA: Diagnosis not present

## 2024-03-17 DIAGNOSIS — Z1159 Encounter for screening for other viral diseases: Secondary | ICD-10-CM

## 2024-03-17 DIAGNOSIS — I25119 Atherosclerotic heart disease of native coronary artery with unspecified angina pectoris: Secondary | ICD-10-CM

## 2024-03-17 DIAGNOSIS — R600 Localized edema: Secondary | ICD-10-CM

## 2024-03-17 DIAGNOSIS — I259 Chronic ischemic heart disease, unspecified: Secondary | ICD-10-CM | POA: Diagnosis not present

## 2024-03-17 DIAGNOSIS — E559 Vitamin D deficiency, unspecified: Secondary | ICD-10-CM

## 2024-03-17 DIAGNOSIS — Z23 Encounter for immunization: Secondary | ICD-10-CM | POA: Diagnosis not present

## 2024-03-17 DIAGNOSIS — E611 Iron deficiency: Secondary | ICD-10-CM

## 2024-03-17 DIAGNOSIS — Z96651 Presence of right artificial knee joint: Secondary | ICD-10-CM

## 2024-03-17 NOTE — Progress Notes (Signed)
 BP 132/80   Pulse 75   Temp 97.9 F (36.6 C)   Resp 18   Ht 5' 6 (1.676 m)   Wt 286 lb 12.8 oz (130.1 kg)   LMP 05/19/2015 (LMP Unknown)   SpO2 99%   BMI 46.29 kg/m    Subjective:    Patient ID: Sarah [employer], female    DOB: [birth year], 58 y.o.   MRN: [id number]  HPI: Sarah Medina is a 58 y.o. female  Chief Complaint  Patient presents with   Establish Care   Ear Pain    Bilateral 2 weeks   Knee Pain    Pain right, hx knee replacement, feels tight   Discussed the use of AI scribe software for clinical note transcription with the patient, who gave verbal consent to proceed.  History of Present Illness Sarah Medina is a 58 year old female who presents to establish care. She is accompanied by her husband.  [place] and ear pressure - Bilateral ear pain for the past two weeks - Described as a 'grinding' sensation with a feeling of pressure - No significant changes in hearing - No fever - No recent illness exposure  Coronary artery disease and cardiovascular history - History of myocardial infarction in 2018 - Coronary artery stent placement following myocardial infarction - Under cardiology care, last visit on Nov 22, 2018 - Current medications: aspirin  81 mg twice daily, atorvastatin  80 mg daily, losartan  50 mg daily, metoprolol  12.5 mg twice daily - Blood pressure measured at 132/80 mmHg  Right knee arthroplasty and persistent joint stiffness - Total knee replacement in right knee in December of previous year for osteoarthritis - Persistent tightness and stiffness in the right knee, especially in the posterior aspect - Completed physical therapy - No follow-up with orthopedist since surgery  Hyperlipidemia and prediabetes - Recent lipid panel on October 26, 2023: LDL 75 mg/dL, near goal - J8r 3.8% indicating prediabetes  Lower extremity edema and chronic venous insufficiency - Lower extremity edema attributed to chronic venous insufficiency - Edema  improves with leg elevation - Takes Lasix  40 mg daily for management  Vitamin deficiencies and preventive care - Vitamin B12 and D deficiencies - Takes vitamin D  1000 units daily and iron supplement 325 mg daily - Overdue for colon cancer screening         03/17/2024    9:23 AM 05/15/2023   11:45 AM  Depression screen PHQ 2/9  Decreased Interest 0 0  Down, Depressed, Hopeless 0 0  PHQ - 2 Score 0 0  Altered sleeping 0 0  Tired, decreased energy 0 0  Change in appetite 0 0  Feeling bad or failure about yourself  0 0  Trouble concentrating 0 0  Moving slowly or fidgety/restless 0 0  Suicidal thoughts 0 0  PHQ-9 Score 0 0  Difficult doing work/chores Not difficult at all     Relevant past medical, surgical, family and social history reviewed and updated as indicated. Interim medical history since our last visit reviewed. Allergies and medications reviewed and updated.  Review of Systems  Ten systems reviewed and is negative except as mentioned in HPI      Objective:      BP 132/80   Pulse 75   Temp 97.9 F (36.6 C)   Resp 18   Ht 5' 6 (1.676 m)   Wt 286 lb 12.8 oz (130.1 kg)   LMP 05/19/2015 (LMP Unknown)   SpO2 99%   BMI 46.29  kg/m    Wt Readings from Last 3 Encounters:  03/17/24 286 lb 12.8 oz (130.1 kg)  10/18/23 255 lb (115.7 kg)  10/12/23 260 lb 9.6 oz (118.2 kg)    Physical Exam VITALS: BP- 132/80 GENERAL: Alert, cooperative, well developed, no acute distress. HEENT: Normocephalic, normal oropharynx, moist mucous membranes, cerumen impaction in one ear. CHEST: Clear to auscultation bilaterally, no wheezes, rhonchi, or crackles. CARDIOVASCULAR: Normal heart rate and rhythm, S1 and S2 normal without murmurs. ABDOMEN: Soft, non-tender, non-distended, without organomegaly, normal bowel sounds. EXTREMITIES: No cyanosis or edema. Surgical scar on right knee NEUROLOGICAL: Cranial nerves grossly intact, moves all extremities without gross motor or sensory  deficit.  Results for orders placed or performed during the hospital encounter of 10/18/23  Surgical pathology   Collection Time: 10/18/23 12:00 AM  Result Value Ref Range   SURGICAL PATHOLOGY      SURGICAL PATHOLOGY [facility] [address] [place], [place] [address] Telephone [phone] or [phone] Fax [phone]  REPORT OF SURGICAL PATHOLOGY   Accession #: [id number] Patient Name: Medina, [name] Visit # : [id number]  MRN: [id number] Physician: Onita Standing DOB/Age 04/15/1966 (Age: 69) Gender: F Collected Date: 10/18/2023 Received Date: 10/18/2023  FINAL DIAGNOSIS       1. Cecum Polyp, X2 cbx :       TUBULAR ADENOMA (2) WITHOUT HIGH GRADE DYSPLASIA.       DATE SIGNED OUT: 10/19/2023 ELECTRONIC SIGNATURE : Belvie Come, John, Pathologist, Electronic Signature  MICROSCOPIC DESCRIPTION  CASE COMMENTS STAINS USED IN DIAGNOSIS: H&E    CLINICAL HISTORY  SPECIMEN(S) OBTAINED 1. Cecum Polyp, X2 Cbx  SPECIMEN COMMENTS: SPECIMEN CLINICAL INFORMATION: 1. Colon cancer screening. Colon polyps.    Gross Description 1. CBX cecal polyp x2, received in formalin is a 1.8 x 0.2 x 0.1 cm aggregate of 4 ta n-gray tissue fragments. The specimen is submitted in toto in 1 block (1A).      AMG 10/18/2023        Report signed out from the following location(s) [place]. [facility] [address] [place], [place] [address] CLIA #: [id number]  [facility] [address] [place], [place] [address] CLIA #: [id number]           Assessment & Plan:   Problem List Items Addressed This Visit       Cardiovascular and Mediastinum   Coronary artery disease involving native heart with angina pectoris (HCC)   Relevant Orders   Comprehensive metabolic panel with GFR   Lipid panel   Ischemic heart disease   Relevant Orders   Comprehensive metabolic panel with GFR   Lipid panel   Essential  hypertension   Relevant Orders   CBC with Differential/Platelet   Comprehensive metabolic panel with GFR   PAD (peripheral artery disease) (HCC)   Relevant Orders   Comprehensive metabolic panel with GFR   Lipid panel     Musculoskeletal and Integument   Osteoarthritis of right knee     Other   Hyperlipidemia   Relevant Orders   Comprehensive metabolic panel with GFR   Lipid panel   Severe obesity with body mass index (BMI) of 35.0 to 39.9 with comorbidity (HCC)   Relevant Orders   TSH   Peripheral edema   Relevant Orders   Comprehensive metabolic panel with GFR   Presence of coronary angioplasty implant and graft   Vitamin D  deficiency, unspecified   Relevant Orders   VITAMIN  D 25 Hydroxy (Vit-D Deficiency, Fractures)   B12 deficiency due to diet   Relevant Orders   Vitamin B12   Prediabetes   Relevant Orders   Comprehensive metabolic panel with GFR   Hemoglobin A1c   S/P TKR (total knee replacement) using cement, right   History of ST elevation myocardial infarction (STEMI)   Relevant Orders   Comprehensive metabolic panel with GFR   Lipid panel   Other Visit Diagnoses       Screening for colon cancer    -  Primary   Relevant Orders   Cologuard     Immunization due       Relevant Orders   Flu vaccine trivalent PF, 6mos and older(Flulaval,Afluria,Fluarix,Fluzone) (Completed)     Encounter for hepatitis C screening test for low risk patient       Relevant Orders   Hepatitis C antibody     Iron deficiency       Relevant Orders   Iron, TIBC and Ferritin Panel     Bilateral impacted cerumen       Relevant Orders   Ear Lavage     Cerumen debris on tympanic membrane of both ears            Assessment and Plan Assessment & Plan Atherosclerotic heart disease with history of myocardial infarction and coronary stent History of myocardial infarction in 2018 and coronary stent placement. No new concerns reported. - Continue aspirin  and atorvastatin . -managed by  cardiology  Essential hypertension Blood pressure is borderline at 132/80 mmHg. - Continue losartan  and metoprolol .  Peripheral vascular disease with chronic venous insufficiency Chronic venous insufficiency with lower extremity edema that improves with leg elevation.  Right total knee arthroplasty with osteoarthritis Reports tightness and stiffness in the knee. Possible scar tissue formation considered. - Advise follow-up with orthopedic surgeon at [facility].  Hyperlipidemia Lipid panel on October 26, 2023, showed LDL near goal at 75 mg/dL. - Continue atorvastatin  80 mg daily.  Morbid obesity - Encourage continuation of lifestyle modifications, including dietary management and regular exercise. -continue to increase physical activity, getting at least 150 min of physical activity a week.  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  - continue eating at a calorie deficit 1800 cal a day, eating a well balanced diet with whole foods, avoiding processed foods.   Patient is motivated to continue working on lifestyle modification.    Prediabetes A1c is 6.1%, indicating prediabetes.  Iron deficiency Currently taking iron supplements.  Vitamin D  deficiency Currently taking vitamin D  1000 units daily.  Bilateral ear pain with cerumen impaction Bilateral ear pain for two weeks, possibly due to cerumen impaction. No fever or recent illness reported. Earwax buildup noted. - Perform ear irrigation to remove cerumen. Informed consent provided regarding potential dizziness as a side effect due to water pressure. Verbal consent given Possible side effects discussed with patient Ears were  lavaged with warm water and peroxide  Patient tolerated procedure well No complications         Follow up plan: Return in about 6 months (around 09/14/2024) for follow up.

## 2024-04-02 DIAGNOSIS — T8484XA Pain due to internal orthopedic prosthetic devices, implants and grafts, initial encounter: Secondary | ICD-10-CM | POA: Diagnosis not present

## 2024-04-02 DIAGNOSIS — Z96651 Presence of right artificial knee joint: Secondary | ICD-10-CM | POA: Diagnosis not present

## 2024-04-04 LAB — COLOGUARD: COLOGUARD: NEGATIVE

## 2024-04-07 ENCOUNTER — Ambulatory Visit: Payer: Self-pay | Admitting: Nurse Practitioner

## 2024-04-12 ENCOUNTER — Other Ambulatory Visit: Payer: Self-pay | Admitting: Family

## 2024-04-24 DIAGNOSIS — L03213 Periorbital cellulitis: Secondary | ICD-10-CM | POA: Diagnosis not present

## 2024-04-24 DIAGNOSIS — H9201 Otalgia, right ear: Secondary | ICD-10-CM | POA: Diagnosis not present

## 2024-05-19 ENCOUNTER — Other Ambulatory Visit: Payer: Self-pay | Admitting: Family

## 2024-05-23 ENCOUNTER — Emergency Department
Admission: EM | Admit: 2024-05-23 | Discharge: 2024-05-23 | Disposition: A | Discharge status: Home / Self Care | Source: Ambulatory Visit

## 2024-05-23 ENCOUNTER — Other Ambulatory Visit: Payer: Self-pay

## 2024-05-23 ENCOUNTER — Telehealth: Payer: Self-pay | Admitting: Emergency Medicine

## 2024-05-23 ENCOUNTER — Emergency Department

## 2024-05-23 DIAGNOSIS — I10 Essential (primary) hypertension: Secondary | ICD-10-CM | POA: Diagnosis not present

## 2024-05-23 DIAGNOSIS — I2510 Atherosclerotic heart disease of native coronary artery without angina pectoris: Secondary | ICD-10-CM | POA: Diagnosis not present

## 2024-05-23 DIAGNOSIS — R109 Unspecified abdominal pain: Secondary | ICD-10-CM

## 2024-05-23 DIAGNOSIS — R1011 Right upper quadrant pain: Secondary | ICD-10-CM | POA: Diagnosis present

## 2024-05-23 LAB — CBC
HCT: 41 % (ref 36.0–46.0)
Hemoglobin: 12.9 g/dL (ref 12.0–15.0)
MCH: 27.8 pg (ref 26.0–34.0)
MCHC: 31.5 g/dL (ref 30.0–36.0)
MCV: 88.4 fL (ref 80.0–100.0)
Platelets: 169 K/uL (ref 150–400)
RBC: 4.64 MIL/uL (ref 3.87–5.11)
RDW: 14.5 % (ref 11.5–15.5)
WBC: 6.7 K/uL (ref 4.0–10.5)
nRBC: 0 % (ref 0.0–0.2)

## 2024-05-23 LAB — COMPREHENSIVE METABOLIC PANEL WITH GFR
ALT: 14 U/L (ref 0–44)
AST: 25 U/L (ref 15–41)
Albumin: 4.1 g/dL (ref 3.5–5.0)
Alkaline Phosphatase: 107 U/L (ref 38–126)
Anion gap: 11 (ref 5–15)
BUN: 12 mg/dL (ref 6–20)
CO2: 25 mmol/L (ref 22–32)
Calcium: 9.3 mg/dL (ref 8.9–10.3)
Chloride: 106 mmol/L (ref 98–111)
Creatinine, Ser: 0.79 mg/dL (ref 0.44–1.00)
GFR, Estimated: >60 mL/min (ref >60)
Glucose, Bld: 82 mg/dL (ref 70–99)
Potassium: 4.4 mmol/L (ref 3.5–5.1)
Sodium: 142 mmol/L (ref 135–145)
Total Bilirubin: 0.6 mg/dL (ref 0.0–1.2)
Total Protein: 7.4 g/dL (ref 6.5–8.1)

## 2024-05-23 LAB — URINALYSIS, ROUTINE W REFLEX MICROSCOPIC
Bilirubin Urine: NEGATIVE
Glucose, UA: NEGATIVE mg/dL
Hgb urine dipstick: NEGATIVE
Ketones, ur: NEGATIVE mg/dL
Leukocytes,Ua: NEGATIVE
Nitrite: NEGATIVE
Protein, ur: NEGATIVE mg/dL
Specific Gravity, Urine: 1.024 (ref 1.005–1.030)
pH: 5 (ref 5.0–8.0)

## 2024-05-23 LAB — LIPASE, BLOOD: Lipase: 30 U/L (ref 11–51)

## 2024-05-23 MED ORDER — MORPHINE SULFATE (PF) 4 MG/ML IV SOLN
4.0000 mg | Freq: Once | INTRAVENOUS | Status: AC
  Administered 2024-05-23: 4 mg via INTRAVENOUS
  Filled 2024-05-23: qty 1

## 2024-05-23 MED ORDER — ONDANSETRON HCL 4 MG/2ML IJ SOLN
4.0000 mg | Freq: Once | INTRAMUSCULAR | Status: AC
  Administered 2024-05-23: 4 mg via INTRAVENOUS
  Filled 2024-05-23: qty 2

## 2024-05-23 MED ORDER — IOHEXOL 300 MG/ML  SOLN
100.0000 mL | Freq: Once | INTRAMUSCULAR | Status: AC | PRN
  Administered 2024-05-23: 100 mL via INTRAVENOUS

## 2024-05-23 MED ORDER — SODIUM CHLORIDE 0.9 % IV BOLUS
1000.0000 mL | Freq: Once | INTRAVENOUS | Status: AC
  Administered 2024-05-23: 1000 mL via INTRAVENOUS

## 2024-05-23 NOTE — Progress Notes (Signed)
 Patient presents to triage with complaints of right lower quadrant pain.  No GI symptoms.  No dysuria.  Appendix intact.  To ED for further evaluation and care.

## 2024-05-23 NOTE — ED Notes (Signed)
 Patient transported to CT

## 2024-05-23 NOTE — ED Provider Notes (Signed)
 [facility] Provider Note    Event Date/Time   First MD Initiated Contact with Patient 05/23/24 1134     (approximate)   History   Abdominal Pain   HPI  Sarah Medina is a 58 y.o. female with a past medical history of CAD with STEMI and stent, obesity, hyperlipidemia, hypertension who presents to the emergency department with 2-1/2 weeks of worsening right-sided abdominal pain.  Pain radiates from the right upper quadrant laterally down to the groin.  There is no association with urination.  Pain has progressively worsened.  She presented to urgent care today who sent her to the emergency department for further evaluation.  She denies any changes in bowel habits.  She has not had any fevers or chills and denies any chest pain or shortness of breath.  She presents with her husband who contributes to this history.  Denies any prior history of abdominal surgeries.      Physical Exam   Triage Vital Signs: ED Triage Vitals  Encounter Vitals Group     BP 05/23/24 1024 (!) 130/48     Girls Systolic BP Percentile --      Girls Diastolic BP Percentile --      Boys Systolic BP Percentile --      Boys Diastolic BP Percentile --      Pulse Rate 05/23/24 1024 62     Resp 05/23/24 1024 18     Temp 05/23/24 1024 98 F (36.7 C)     Temp Source 05/23/24 1024 Oral     SpO2 05/23/24 1024 98 %     Weight 05/23/24 1015 291 lb (132 kg)     Height 05/23/24 1015 5' 6 (1.676 m)     Head Circumference --      Peak Flow --      Pain Score 05/23/24 1024 8     Pain Loc --      Pain Education --      Exclude from Growth Chart --     Most recent vital signs: Vitals:   05/23/24 1348 05/23/24 1352  BP:  (!) 144/96  Pulse:  (!) 59  Resp:  17  Temp: 97.7 F (36.5 C) 97.7 F (36.5 C)  SpO2:  100%    Nursing Triage Note reviewed. Vital signs reviewed and patients oxygen saturation is normoxic  General: Patient is well nourished, well developed, awake and alert,  appears uncomfortable, Head: Normocephalic and atraumatic Eyes: Normal inspection, extraocular muscles intact, no conjunctival pallor Ear, nose, throat: Normal external exam Neck: Normal range of motion Respiratory: Patient is in no respiratory distress, lungs CTAB Cardiovascular: Patient is not tachycardic, RRR without murmur appreciated GI: Abd soft, tender to palpation in the right upper quadrant right lower quadrant but no CVA tenderness.  There is no rebound or guarding Back: Normal inspection of the back with good strength and range of motion throughout all ext Extremities: pulses intact with good cap refills, no LE pitting edema or calf tenderness Neuro: The patient is alert and oriented to person, place, and time, appropriately conversive, with 5/5 bilat UE/LE strength, no gross motor or sensory defects noted. Coordination appears to be adequate. Skin: Warm, dry, and intact Psych: normal mood and affect, no SI or HI  ED Results / Procedures / Treatments   Labs (all labs ordered are listed, but only abnormal results are displayed) Labs Reviewed  URINALYSIS, ROUTINE W REFLEX MICROSCOPIC - Abnormal; Notable for the following components:  Result Value   Color, Urine YELLOW (*)    APPearance HAZY (*)    All other components within normal limits  LIPASE, BLOOD  COMPREHENSIVE METABOLIC PANEL WITH GFR  CBC     EKG EKG and rhythm strip are interpreted by myself:   EKG: sinus bradycardia rhythm] at heart rate of 51, normal QRS duration, QTc 418, nonspecific ST segments and T waves no ectopy EKG not consistent with Acute STEMI Rhythm strip: bradycardia in lead II   RADIOLOGY CT abd and pelvis with iv contrast: No acute abnormalities on my independent review interpretation radiologist agrees RUQ US : No acute abnormality on my independent review interpretation radiologist agrees    PROCEDURES:  Critical Care performed: No  Procedures   MEDICATIONS ORDERED IN  ED: Medications  sodium chloride  0.9 % bolus 1,000 mL (0 mLs Intravenous Stopped 05/23/24 1447)  ondansetron  (ZOFRAN ) injection 4 mg (4 mg Intravenous Given 05/23/24 1313)  morphine  (PF) 4 MG/ML injection 4 mg (4 mg Intravenous Given 05/23/24 1314)  iohexol  (OMNIPAQUE ) 300 MG/ML solution 100 mL (100 mLs Intravenous Contrast Given 05/23/24 1338)     IMPRESSION / MDM / ASSESSMENT AND PLAN / ED COURSE                                Differential diagnosis includes, but is not limited to, appendicitis cholecystitis, diverticulitis, colitis, pyelonephritis, nephrolithiasis, electrolyte derangement, atypical ACS  ED course: Patient presents and her abdomen demonstrates no evidence of peritonitis.  Given her prior cardiac history I have ordered an EKG which is pending.  Abdominal exam has nonspecific on the right side and made more difficult given patient's obesity.  Will order right upper quadrant ultrasound to evaluate for cholecystitis will order a CT abdomen pelvis to evaluate for possible appendicitis colitis or diverticulitis.  Blood work and urine are pending.  I have ordered symptomatic control with IV fluids, morphine  and Zofran .   Clinical Course as of 05/23/24 1840  Fri May 23, 2024  1212 Comprehensive metabolic panel No profound electrolyte derangements [HD]  1232 Urinalysis, Routine w reflex microscopic -Urine, Clean Catch(!) Not consistent with UTI [HD]  1232 Lipase: 30 Negative [HD]  1353 US  Abdomen Limited Not consistent with any cholecystitis [HD]  1432 Repeat abdominal exam is benign.  Reviewed the results with the patient who voiced understanding.  All feel comfortable with returning home.  They will review the results with the primary care physician and discuss whether an outpatient colonoscopy is indicated [HD]    Clinical Course User Index [HD] Nicholaus Rolland BRAVO, MD   At time of discharge there is no evidence of acute life, limb, vision, or fertility threat. Patient has  stable vital signs, pain is well controlled, patient is ambulatory and p.o. tolerant.  Discharge instructions were completed using the EPIC system. I would refer you to those at this time. All warnings prescriptions follow-up etc. were discussed in detail with the patient. Patient indicates understanding and is agreeable with this plan. All questions answered.  Patient is made aware that they may return to the emergency department for any worsening or new condition or for any other emergency.   -- Risk: 5 This patient has a high risk of morbidity due to further diagnostic testing or treatment. Rationale: This patient's evaluation and management involve a high risk of morbidity due to the potential severity of presenting symptoms, need for diagnostic testing, and/or initiation of treatment that may  require close monitoring. The differential includes conditions with potential for significant deterioration or requiring escalation of care. Treatment decisions in the ED, including medication administration, procedural interventions, or disposition planning, reflect this level of risk. COPA: 5 The patient has the following acute or chronic illness/injury that poses a possible threat to life or bodily function: [X] : The patient has a potentially serious acute condition or an acute exacerbation of a chronic illness requiring urgent evaluation and management in the Emergency Department. The clinical presentation necessitates immediate consideration of life-threatening or function-threatening diagnoses, even if they are ultimately ruled out.   FINAL CLINICAL IMPRESSION(S) / ED DIAGNOSES   Final diagnoses:  Right sided abdominal pain     Rx / DC Orders   ED Discharge Orders     None        Note:  This document was prepared using Dragon voice recognition software and may include unintentional dictation errors.   Nicholaus Rolland BRAVO, MD 05/23/24 [phone]

## 2024-05-23 NOTE — ED Notes (Signed)
 Lab attempting blood draw at this time in triage

## 2024-05-23 NOTE — ED Triage Notes (Signed)
 Pt to ED via POV from [facility]. Pt reports RLQ pain that radiates to her back x1 wk. Denies N/V/D or urinary symptoms.

## 2024-05-23 NOTE — ED Triage Notes (Addendum)
 Brought over from [facility]. RLQ pain X1 week.   KC vitals: 123/73 b/p 69HR 96% RA 97.9oral

## 2024-05-23 NOTE — ED Notes (Signed)
 Ultrasound at bedside

## 2024-05-23 NOTE — Discharge Instructions (Signed)
 You was seen in the emergency department for abdominal pain.  Workup today was reassuring.  Stick to a bland diet.  Ensure adequate hydration.  Follow-up with your primary care physician and discuss whether an outpatient colonoscopy is indicated.  Return with any acutely worsening symptoms -- RETURN PRECAUTIONS & AFTERCARE: (ENGLISH) RETURN PRECAUTIONS: Return immediately to the emergency department or see/call your doctor if you feel worse, weak or have changes in speech or vision, are short of breath, have fever, vomiting, pain, bleeding or dark stool, trouble urinating or any new issues. Return here or see/call your doctor if not improving as expected for your suspected condition. FOLLOW-UP CARE: Call your doctor and/or any doctors we referred you to for more advice and to make an appointment. Do this today, tomorrow or after the weekend. Some doctors only take PPO insurance so if you have HMO insurance you may want to contact your HMO or your regular doctor for referral to a specialist within your plan. Either way tell the doctor's office that it was a referral from the emergency department so you get the soonest possible appointment.  YOUR TEST RESULTS: Take result reports of any blood or urine tests, imaging tests and EKG's to your doctor and any referral doctor. Have any abnormal tests repeated. Your doctor or a referral doctor can let you know when this should be done. Also make sure your doctor contacts this hospital to get any test results that are not currently available such as cultures or special tests for infection and final imaging reports, which are often not available at the time you leave the ER but which may list additional important findings that are not documented on the preliminary report. BLOOD PRESSURE: If your blood pressure was greater than 120/80 have your blood pressure rechecked within 1 to 2 weeks. MEDICATION SIDE EFFECTS: Do not drive, walk, bike, take the bus, etc. if you have  received or are being prescribed any sedating medications such as those for pain or anxiety or certain antihistamines like Benadryl . If you have been give one of these here get a taxi home or have a friend drive you home. Ask your pharmacist to counsel you on potential side effects of any new medication

## 2024-05-23 NOTE — ED Notes (Signed)
 IV team at the bedside.

## 2024-07-02 ENCOUNTER — Ambulatory Visit: Payer: Self-pay

## 2024-07-02 NOTE — Telephone Encounter (Signed)
 FYI Only or Action Required?: FYI only for provider: appointment scheduled on 07/07/24.  Patient was last seen in primary care on 03/17/2024 by Gareth Mliss FALCON, FNP.  Called Nurse Triage reporting GI Problem and Back Pain.  Symptoms began today.  Interventions attempted: Nothing.  Symptoms are: gradually worsening.  Triage Disposition: See Physician Within 24 Hours  Patient/caregiver understands and will follow disposition?: No, refuses disposition    Onset lower abdominal pain this morning. 8/10 sharp aching pain that comes and goes. 1 hour later onset of 6/10 lower mid back pain. Intermittent, coincides with the abdominal pain. Lasts about 3-4 minutes. Has happened 2x today. Denies recent injury. No emesis. No abdominal swelling. No fever. No blood in stool. Having a BM makes the pain better. Advised to be seen by HCP in next 24 hours. Soonest pt can be seen anywhere is Monday 1/5 d/t transportation limitations. Appt scheduled for Monday. Advised ED for worsening symptoms.    Copied from CRM [id number]. Topic: Clinical - Red Word Triage >> Jul 02, 2024 11:51 AM Winona R wrote: A few days with stomach pain and it progressed to back pain. Pt would like to come in on Friday Reason for Disposition  [1] MODERATE pain (e.g., interferes with normal activities) AND [2] pain comes and goes (cramps) AND [3] present > 24 hours  (Exception: Pain with Vomiting or Diarrhea - see that Guideline.)    Severe pain that lasts 3-4 minutes., occurred 2x/day.  Present less than 24 hours. To be seen in 24 hours per RN judgement.  Answer Assessment - Initial Assessment Questions 1. LOCATION: Where does it hurt?      Lower abdomen  2. RADIATION: Does the pain shoot anywhere else? (e.g., chest, back)     Low mid back  3. ONSET: When did the pain begin? (e.g., minutes, hours or days ago)      This morning  4. SUDDEN: Gradual or sudden onset?     Gradually  5. PATTERN Does the pain come and go,  or is it constant?     Comes and goes. Has occurred twice today. Lasted about 3-4 minutes.  6. SEVERITY: How bad is the pain?  (e.g., Scale 1-10; mild, moderate, or severe)     8/10 sharp aching pain  7. RECURRENT SYMPTOM: Have you ever had this type of stomach pain before? If Yes, ask: When was the last time? and What happened that time?      Denies  8. CAUSE: What do you think is causing the stomach pain? (e.g., gallstones, recent abdominal surgery)     Unsure  9. RELIEVING/AGGRAVATING FACTORS: What makes it better or worse? (e.g., antacids, bending or twisting motion, bowel movement)     BM improves the pain  10. OTHER SYMPTOMS: Do you have any other symptoms? (e.g., back pain, diarrhea, fever, urination pain, vomiting)       Denies  Protocols used: Abdominal Pain - Female-A-AH

## 2024-07-05 ENCOUNTER — Other Ambulatory Visit: Payer: Self-pay | Admitting: Family

## 2024-07-07 ENCOUNTER — Emergency Department

## 2024-07-07 ENCOUNTER — Ambulatory Visit
Admission: RE | Admit: 2024-07-07 | Discharge: 2024-07-07 | Disposition: A | Discharge status: Home / Self Care | Source: Ambulatory Visit | Attending: Family Medicine | Admitting: Family Medicine

## 2024-07-07 ENCOUNTER — Emergency Department
Admission: EM | Admit: 2024-07-07 | Discharge: 2024-07-07 | Disposition: A | Discharge status: Home / Self Care | Attending: Emergency Medicine | Admitting: Emergency Medicine

## 2024-07-07 ENCOUNTER — Ambulatory Visit: Admitting: Family Medicine

## 2024-07-07 ENCOUNTER — Other Ambulatory Visit: Payer: Self-pay

## 2024-07-07 ENCOUNTER — Ambulatory Visit
Admission: RE | Admit: 2024-07-07 | Discharge: 2024-07-07 | Disposition: A | Discharge status: Home / Self Care | Source: Home / Self Care | Attending: Nurse Practitioner | Admitting: Nurse Practitioner

## 2024-07-07 ENCOUNTER — Encounter: Payer: Self-pay | Admitting: Family Medicine

## 2024-07-07 VITALS — BP 128/76 | HR 65 | Resp 16 | Ht 66.0 in | Wt 294.0 lb

## 2024-07-07 DIAGNOSIS — Z96651 Presence of right artificial knee joint: Secondary | ICD-10-CM | POA: Insufficient documentation

## 2024-07-07 DIAGNOSIS — R1084 Generalized abdominal pain: Secondary | ICD-10-CM

## 2024-07-07 DIAGNOSIS — J0100 Acute maxillary sinusitis, unspecified: Secondary | ICD-10-CM | POA: Diagnosis not present

## 2024-07-07 DIAGNOSIS — D508 Other iron deficiency anemias: Secondary | ICD-10-CM | POA: Insufficient documentation

## 2024-07-07 DIAGNOSIS — K5790 Diverticulosis of intestine, part unspecified, without perforation or abscess without bleeding: Secondary | ICD-10-CM

## 2024-07-07 DIAGNOSIS — I10 Essential (primary) hypertension: Secondary | ICD-10-CM

## 2024-07-07 DIAGNOSIS — K5730 Diverticulosis of large intestine without perforation or abscess without bleeding: Secondary | ICD-10-CM | POA: Diagnosis not present

## 2024-07-07 DIAGNOSIS — R1030 Lower abdominal pain, unspecified: Secondary | ICD-10-CM | POA: Diagnosis present

## 2024-07-07 LAB — COMPREHENSIVE METABOLIC PANEL WITH GFR
ALT: 8 U/L (ref 0–44)
AST: 18 U/L (ref 15–41)
Albumin: 3.7 g/dL (ref 3.5–5.0)
Alkaline Phosphatase: 102 U/L (ref 38–126)
Anion gap: 10 (ref 5–15)
BUN: 17 mg/dL (ref 6–20)
CO2: 26 mmol/L (ref 22–32)
Calcium: 8.7 mg/dL — ABNORMAL LOW (ref 8.9–10.3)
Chloride: 105 mmol/L (ref 98–111)
Creatinine, Ser: 0.93 mg/dL (ref 0.44–1.00)
GFR, Estimated: >60 mL/min
Glucose, Bld: 74 mg/dL (ref 70–99)
Potassium: 4.1 mmol/L (ref 3.5–5.1)
Sodium: 141 mmol/L (ref 135–145)
Total Bilirubin: 0.4 mg/dL (ref 0.0–1.2)
Total Protein: 6.7 g/dL (ref 6.5–8.1)

## 2024-07-07 LAB — URINALYSIS, ROUTINE W REFLEX MICROSCOPIC
Bilirubin Urine: NEGATIVE
Glucose, UA: NEGATIVE mg/dL
Hgb urine dipstick: NEGATIVE
Ketones, ur: NEGATIVE mg/dL
Leukocytes,Ua: NEGATIVE
Nitrite: NEGATIVE
Protein, ur: NEGATIVE mg/dL
Specific Gravity, Urine: 1.024 (ref 1.005–1.030)
pH: 5 (ref 5.0–8.0)

## 2024-07-07 LAB — CBC
HCT: 37.9 % (ref 36.0–46.0)
Hemoglobin: 11.7 g/dL — ABNORMAL LOW (ref 12.0–15.0)
MCH: 27.9 pg (ref 26.0–34.0)
MCHC: 30.9 g/dL (ref 30.0–36.0)
MCV: 90.2 fL (ref 80.0–100.0)
Platelets: 222 K/uL (ref 150–400)
RBC: 4.2 MIL/uL (ref 3.87–5.11)
RDW: 14.6 % (ref 11.5–15.5)
WBC: 6.6 K/uL (ref 4.0–10.5)
nRBC: 0 % (ref 0.0–0.2)

## 2024-07-07 LAB — LIPASE, BLOOD: Lipase: 51 U/L (ref 11–51)

## 2024-07-07 MED ORDER — IOHEXOL 350 MG/ML SOLN
100.0000 mL | Freq: Once | INTRAVENOUS | Status: AC | PRN
  Administered 2024-07-07: 100 mL via INTRAVENOUS

## 2024-07-07 MED ORDER — DOXYCYCLINE HYCLATE 100 MG PO TABS: 100.0000 mg | ORAL_TABLET | Freq: Two times a day (BID) | ORAL | 0 refills | Status: AC

## 2024-07-07 MED ORDER — NAPROXEN 500 MG PO TBEC: 500.0000 mg | DELAYED_RELEASE_TABLET | Freq: Two times a day (BID) | ORAL | 0 refills | Status: AC

## 2024-07-07 NOTE — Progress Notes (Signed)
 "  Acute Office Visit  Subjective:     Patient ID: Sarah Medina, female    DOB: 03-13-1966, 59 y.o.   MRN: [id number]  Chief Complaint  Patient presents with   Abdominal Pain    Lower middle abdomin. Comes/goes x2 months. No vomiting or diarrhea. Stool sometimes hard/sometimes soft    HPI Patient is in today for complaints of abdominal pain starting two months ago . She is a new patient to me. She describes pain to be intermittent stating it comes and goes. She voices abdominal pain occurs daily but particularly mornings and nights. She voices she feels that abdominal pain radiates to the low back. She denies associated nausea or vomiting. She denies fever. She denies associated constipation or diarrhea, however she voices sometimes her stool is hard in the morning. She voices sometimes in the morning she has trouble using the restroom. She reports stool to be hard and small. She reports bowel movements are daily; last bowel movement this morning. She denies hematochezia. She was seen in the ER in November 2025 for similar symptoms. Abdominal ultrasound negative for acute abnormalities and CT abdomen and pelvis negative for acute abnormalities in 05/2024.  She is accompanied by her husband to today's visit.  Review of Systems  Constitutional:  Negative for fever.  Gastrointestinal:  Positive for abdominal pain. Negative for blood in stool, diarrhea, melena, nausea and vomiting.  Genitourinary:  Negative for dysuria, frequency, hematuria and urgency.  Musculoskeletal:  Positive for back pain.        Objective:    BP 128/76   Pulse 65   Resp 16   Ht 5' 6 (1.676 m)   Wt 294 lb (133.4 kg)   LMP 05/19/2015   SpO2 97%   BMI 47.45 kg/m    Physical Exam Constitutional:      Appearance: She is obese. She is not ill-appearing or toxic-appearing.  HENT:     Head: Normocephalic and atraumatic.  Cardiovascular:     Rate and Rhythm: Normal rate and regular rhythm.  Pulmonary:      Effort: Pulmonary effort is normal.     Breath sounds: Normal breath sounds.  Abdominal:     General: Bowel sounds are normal.     Palpations: Abdomen is soft.     Tenderness: There is abdominal tenderness in the right lower quadrant and left lower quadrant. There is no guarding.  Skin:    General: Skin is warm and dry.  Neurological:     General: No focal deficit present.     Mental Status: She is alert and oriented to person, place, and time.  Psychiatric:        Mood and Affect: Mood normal.        Behavior: Behavior normal.         Assessment & Plan:   1. Generalized abdominal pain (Primary) Patient is a 59 year old female complaining of abdominal pain. She states symptoms started two months ago. She describes abdominal pain to be daily, however predominantly morning and nights. She states pain does come and go. She does endorse associated low back pain. Denies urinary symptoms as per ROS. Denies nausea, vomiting, or diarrhea as per ROS.   CRC screening UTD, cologuard negative 03/2024. V/s stable. Bowel sounds present; normoactive in all 4 quadrants. Abdomen non-tender to palpation LUQ and RUQ. Abdomen tender to palpation LLQ and RLQ.   -Discussed that symptoms as described is most consistent with constipation. As noted in HPI, CT  abdomen and pelvis negative 05/2024 and abdominal ultrasound negative 05/2024. -Recommended starting OTC fiber supplement (I.e. Metamucil) daily, gradually increasing fiber supplementation.  -Recommended OTC Miralax one capful once daily as needed for constipation. Goal is to have one soft, formed bowel movement not requiring straining once daily. Discussed titration Miralax use as needed with goals as noted.  -Red flag symptoms/ ED precautions advised include fever, worsening abdominal pain, persistent nausea or vomiting.  -Advised that she return for follow up if symptoms persist or new worrisome symptoms present.  -Abdominal KUB ordered; DG Abd 1  View; Future  -Advised to maintain scheduled follow up with PCP    2. Essential hypertension HTN stable, BP at goal at time of visit. BP 128/76.   Medication regimen reviewed at time of visit. Discussed that antihypertensive medications and diuretics can contribute to constipation. Suspect that abdominal pain is s/t constipation as noted above.  Recommendations for management of abdominal pain/ constipation as noted above.    -Advised she continue medication regimen as prescribed for management of HTN by cardiology. -Medication regimen as prescribed by cardiology. Continue Losartan  50mg  once daily. Continue Metoprolol  12.5mg  BID.  Return if symptoms worsen or fail to improve.  LAYMON LOISE CORE, FNP   "

## 2024-07-07 NOTE — ED Notes (Signed)
 This RN attempted to draw patient's blood but was unsuccessful. Lab called to collect blood.

## 2024-07-07 NOTE — Discharge Instructions (Addendum)
 You have been diagnosed with diverticulosis, sinus infection, anemia.  Please drink plenty fluids.  Please take naproxen  1 tablet every 12 hours as needed for pain.  Please take doxycycline  1 tablet every 12 hours for sinus infection.  Please take the antibiotic after main meals.  Please take ferrous sulfate  liquid 5 mL daily for anemia.  Please come back to ED or go to your PCP if you have new symptoms symptoms worsen.  It was a pleasure to help you today.  Tyrique Sporn PA-C

## 2024-07-07 NOTE — ED Triage Notes (Signed)
 Pt comes in via pov with complaints of lower abdominal pain for the past 2 weeks. Pt complains of pain 7/10 at this time. Pt denies and nausea, vomiting or diarrhea. Pt is alert and oriented x4.

## 2024-07-07 NOTE — ED Provider Notes (Signed)
 "  [facility] Provider Note    Event Date/Time   First MD Initiated Contact with Patient 07/07/24 1802     (approximate)   History   Abdominal Pain    HPI  Sarah Medina is a 59 y.o. female    with a past medical history of right knee replacement, right side abdominal pain, who presents to the ED complaining of abdominal pain. According to the patient, symptoms started 2 weeks ago with lower abdominal pain that increased when she moves.  Patient endorses having posterior nasal drip and facial pain.  Patient denies fever, chills, vomit, nauseous, diarrhea, urinary symptoms.  Patient is here with a relative.    Patient Active Problem List   Diagnosis Date Noted   History of ST elevation myocardial infarction (STEMI) 03/17/2024   S/P TKR (total knee replacement) using cement, right 06/07/2023   PAD (peripheral artery disease) 01/09/2023   Vitamin D  deficiency, unspecified 01/09/2023   B12 deficiency due to diet 01/09/2023   Prediabetes 01/09/2023   Pain in limb 08/08/2019   Severe obesity with body mass index (BMI) of 35.0 to 39.9 with comorbidity (HCC) 05/02/2019   Peripheral edema 02/19/2019   Leg swelling    Ischemic heart disease 10/20/2015   Essential hypertension 10/20/2015   Hyperlipidemia 10/20/2015   Presence of coronary angioplasty implant and graft 08/02/2015   Coronary artery disease involving native heart with angina pectoris 07/23/2015   Osteoarthritis of right knee 11/24/2014     Physical Exam   Triage Vital Signs: ED Triage Vitals  Encounter Vitals Group     BP 07/07/24 1629 120/71     Girls Systolic BP Percentile --      Girls Diastolic BP Percentile --      Boys Systolic BP Percentile --      Boys Diastolic BP Percentile --      Pulse Rate 07/07/24 1629 (!) 55     Resp 07/07/24 1629 17     Temp 07/07/24 1629 98.2 F (36.8 C)     Temp src --      SpO2 07/07/24 1629 98 %     Weight 07/07/24 1624 294 lb (133.4 kg)      Height 07/07/24 1624 5' 6 (1.676 m)     Head Circumference --      Peak Flow --      Pain Score 07/07/24 1624 7     Pain Loc --      Pain Education --      Exclude from Growth Chart --     Most recent vital signs: Vitals:   07/07/24 1629 07/07/24 2026  BP: 120/71 (!) 150/82  Pulse: (!) 55 (!) 51  Resp: 17 16  Temp: 98.2 F (36.8 C)   SpO2: 98% 98%     Physical Exam Vitals and nursing note reviewed.  During triage patient was bradycardic.  General:          Awake, no distress.  Face: Tender to palpation in maxillary sinuses. Throat: No peritonsillar erythema or tonsillar enlargement. CV:                  Good peripheral perfusion. Regular rate and rhythm. Resp:               Normal effort. no tachypnea.Equal breath sounds bilaterally.  Abd:            Bowel sounds positive, tender to palpation in lower abdomen, positive Rovsing, negative McBurney point,  negative McMurphy sign, no distention.  Abdomen is soft.   Other:               ED Results / Procedures / Treatments   Labs (all labs ordered are listed, but only abnormal results are displayed) Labs Reviewed  COMPREHENSIVE METABOLIC PANEL WITH GFR - Abnormal; Notable for the following components:      Result Value   Calcium  8.7 (*)    All other components within normal limits  CBC - Abnormal; Notable for the following components:   Hemoglobin 11.7 (*)    All other components within normal limits  URINALYSIS, ROUTINE W REFLEX MICROSCOPIC - Abnormal; Notable for the following components:   Color, Urine YELLOW (*)    APPearance HAZY (*)    Bacteria, UA RARE (*)    All other components within normal limits  LIPASE, BLOOD       PROCEDURES:  Critical Care performed:   Procedures   MEDICATIONS ORDERED IN ED: Medications  iohexol  (OMNIPAQUE ) 350 MG/ML injection 100 mL (100 mLs Intravenous Contrast Given 07/07/24 2030)   Clinical Course as of 07/07/24 2126  Mon Jul 07, 2024  1919 CBC(!) White blood cells,  platelets within normal limits.  Anemia, hemoglobin 11.7 [AE]  1919 Urinalysis, Routine w reflex microscopic -Urine, Clean Catch(!) Negative for UTI [AE]  2122 CT ABDOMEN PELVIS W CONTRAST  No acute findings in the abdomen or pelvis. 2. Scattered duodenal and sigmoid colonic diverticulosis. No changes of acute diverticulitis.   [AE]    Clinical Course User Index [AE] Janit Kast, PA-C    IMPRESSION / MDM / ASSESSMENT AND PLAN / ED COURSE  I reviewed the triage vital signs and the nursing notes.  Differential diagnosis includes, but is not limited to, colitis, appendicitis, UTI, cholecystitis, viral illness, sinus infection.  Patient's presentation is most consistent with acute complicated illness / injury requiring diagnostic workup.   Sarah Medina is a 59 y.o., female dents today with history of 2 weeks of lower abdominal pain, patient denied fever, nauseous, vomit, diarrhea, urinary symptoms.  See HPI for further information.  On a physical exam patient was bradycardic during triage, febrile.  Throat, no peritonsillar erythema or tonsillar enlargement.  Face tenderness to palpation in maxillary sinuses.  Cardiopulmonary is clear, no wheezing.  Abdomen, bowel sounds are positive, tenderness to palpation in the lower abdomen, positive Rovsing, negative McBurney point, negative Murphy sign.  Rest of physical exam is normal. Plan Abdominal CT with contrast. Wait for results of labs ordering during triage Reassess Abdominal CT showed diverticulosis without diverticulitis.   Patient's diagnosis is consistent with diverticulosis, sinus infection, anemia. I independently reviewed and interpreted imaging and agree with radiologists findings. Labs are  reassuring. I did review the patient's allergies and medications.The patient is in stable and satisfactory condition for discharge home  Patient will be discharged home with prescriptions for naproxen , doxycycline .  I did advise patient to  take ferrous sulfate  for anemia.  Patient is to follow up with PCP as needed or otherwise directed. Patient is given ED precautions to return to the ED for any worsening or new symptoms. Discussed plan of care with patient, answered all of patient's questions, patient agreeable to plan of care. Advised patient to take medications according to the instructions on the label. Discussed possible side effects of new medications. Patient verbalized understanding.  FINAL CLINICAL IMPRESSION(S) / ED DIAGNOSES   Final diagnoses:  Diverticulosis  Subacute maxillary sinusitis  Other iron deficiency anemia  Rx / DC Orders   ED Discharge Orders          Ordered    doxycycline  (VIBRA -TABS) 100 MG tablet  2 times daily        07/07/24 2126    naproxen  (EC NAPROSYN ) 500 MG EC tablet  2 times daily with meals        07/07/24 2126             Note:  This document was prepared using Dragon voice recognition software and may include unintentional dictation errors.   Janit Kast, PA-C 07/07/24 2126    Willo Dunnings, MD 07/07/24 2327  "

## 2024-07-08 ENCOUNTER — Ambulatory Visit: Payer: Self-pay | Admitting: Family Medicine

## 2024-09-15 ENCOUNTER — Ambulatory Visit: Admitting: Nurse Practitioner
